# Patient Record
Sex: Female | Born: 1940
Health system: Southern US, Community
[De-identification: ages and names within clinical notes are randomized; demographics above are authoritative.]

## PROBLEM LIST (undated history)

## (undated) DIAGNOSIS — D649 Anemia, unspecified: Secondary | ICD-10-CM

## (undated) DIAGNOSIS — R51 Headache: Secondary | ICD-10-CM

## (undated) DIAGNOSIS — N059 Unspecified nephritic syndrome with unspecified morphologic changes: Secondary | ICD-10-CM

## (undated) DIAGNOSIS — M1990 Unspecified osteoarthritis, unspecified site: Secondary | ICD-10-CM

## (undated) DIAGNOSIS — I10 Essential (primary) hypertension: Secondary | ICD-10-CM

## (undated) DIAGNOSIS — H8110 Benign paroxysmal vertigo, unspecified ear: Secondary | ICD-10-CM

## (undated) DIAGNOSIS — G4733 Obstructive sleep apnea (adult) (pediatric): Secondary | ICD-10-CM

## (undated) DIAGNOSIS — E785 Hyperlipidemia, unspecified: Secondary | ICD-10-CM

## (undated) DIAGNOSIS — K219 Gastro-esophageal reflux disease without esophagitis: Secondary | ICD-10-CM

## (undated) DIAGNOSIS — J40 Bronchitis, not specified as acute or chronic: Secondary | ICD-10-CM

## (undated) DIAGNOSIS — E7800 Pure hypercholesterolemia, unspecified: Secondary | ICD-10-CM

## (undated) DIAGNOSIS — R002 Palpitations: Secondary | ICD-10-CM

## (undated) DIAGNOSIS — Z86010 Personal history of colonic polyps: Secondary | ICD-10-CM

## (undated) DIAGNOSIS — I639 Cerebral infarction, unspecified: Secondary | ICD-10-CM

## (undated) DIAGNOSIS — G459 Transient cerebral ischemic attack, unspecified: Secondary | ICD-10-CM

## (undated) DIAGNOSIS — G5600 Carpal tunnel syndrome, unspecified upper limb: Secondary | ICD-10-CM

## (undated) DIAGNOSIS — K5790 Diverticulosis of intestine, part unspecified, without perforation or abscess without bleeding: Secondary | ICD-10-CM

## (undated) HISTORY — DX: Bronchitis, not specified as acute or chronic: J40

## (undated) HISTORY — DX: Unspecified nephritic syndrome with unspecified morphologic changes: N05.9

## (undated) HISTORY — DX: Carpal tunnel syndrome, unspecified upper limb: G56.00

## (undated) HISTORY — DX: Benign paroxysmal vertigo, unspecified ear: H81.10

## (undated) HISTORY — PX: BREAST BIOPSY: SHX20

## (undated) HISTORY — DX: Unspecified osteoarthritis, unspecified site: M19.90

## (undated) HISTORY — DX: Diverticulosis of intestine, part unspecified, without perforation or abscess without bleeding: K57.90

## (undated) HISTORY — PX: BLADDER REPAIR: SHX76

## (undated) HISTORY — DX: Palpitations: R00.2

## (undated) HISTORY — PX: TONSILLECTOMY: SHX5217

## (undated) HISTORY — DX: Transient cerebral ischemic attack, unspecified: G45.9

## (undated) HISTORY — PX: REPLACEMENT TOTAL KNEE BILATERAL: SUR1225

## (undated) HISTORY — DX: Gastro-esophageal reflux disease without esophagitis: K21.9

## (undated) HISTORY — PX: BREAST EXCISIONAL BIOPSY: SUR124

## (undated) HISTORY — PX: UPPER GASTROINTESTINAL ENDOSCOPY: SHX188

## (undated) HISTORY — DX: Personal history of colonic polyps: Z86.010

## (undated) HISTORY — PX: COLONOSCOPY: SHX174

## (undated) HISTORY — DX: Essential (primary) hypertension: I10

## (undated) HISTORY — DX: Anemia, unspecified: D64.9

## (undated) HISTORY — DX: Obstructive sleep apnea (adult) (pediatric): G47.33

## (undated) HISTORY — DX: Hyperlipidemia, unspecified: E78.5

## (undated) HISTORY — DX: Headache: R51

## (undated) HISTORY — PX: CARPAL TUNNEL RELEASE: SHX101

---

## 1998-11-26 ENCOUNTER — Ambulatory Visit (HOSPITAL_COMMUNITY): Admission: RE | Admit: 1998-11-26 | Discharge: 1998-11-26 | Payer: Self-pay | Admitting: Gastroenterology

## 1999-11-03 ENCOUNTER — Encounter: Payer: Self-pay | Admitting: Urology

## 1999-11-04 ENCOUNTER — Inpatient Hospital Stay (HOSPITAL_COMMUNITY): Admission: RE | Admit: 1999-11-04 | Discharge: 1999-11-07 | Payer: Self-pay | Admitting: Urology

## 2000-05-31 ENCOUNTER — Ambulatory Visit (HOSPITAL_COMMUNITY): Admission: RE | Admit: 2000-05-31 | Discharge: 2000-05-31 | Payer: Self-pay | Admitting: Urology

## 2004-10-25 ENCOUNTER — Ambulatory Visit: Payer: Self-pay | Admitting: Family Medicine

## 2004-10-27 ENCOUNTER — Ambulatory Visit: Payer: Self-pay | Admitting: Family Medicine

## 2004-11-04 ENCOUNTER — Ambulatory Visit: Payer: Self-pay | Admitting: Family Medicine

## 2004-11-09 ENCOUNTER — Ambulatory Visit: Payer: Self-pay | Admitting: Internal Medicine

## 2004-11-11 ENCOUNTER — Ambulatory Visit (HOSPITAL_COMMUNITY): Admission: RE | Admit: 2004-11-11 | Discharge: 2004-11-11 | Payer: Self-pay | Admitting: Family Medicine

## 2004-11-18 ENCOUNTER — Ambulatory Visit: Payer: Self-pay | Admitting: Family Medicine

## 2005-10-12 ENCOUNTER — Ambulatory Visit: Payer: Self-pay | Admitting: Family Medicine

## 2005-10-13 ENCOUNTER — Ambulatory Visit: Payer: Self-pay | Admitting: Internal Medicine

## 2007-04-08 ENCOUNTER — Ambulatory Visit (HOSPITAL_COMMUNITY): Admission: RE | Admit: 2007-04-08 | Discharge: 2007-04-08 | Payer: Self-pay | Admitting: Gynecology

## 2007-04-12 ENCOUNTER — Encounter: Admission: RE | Admit: 2007-04-12 | Discharge: 2007-04-12 | Payer: Self-pay | Admitting: Gynecology

## 2008-01-13 ENCOUNTER — Inpatient Hospital Stay (HOSPITAL_COMMUNITY): Admission: RE | Admit: 2008-01-13 | Discharge: 2008-01-17 | Payer: Self-pay | Admitting: Orthopedic Surgery

## 2008-04-27 ENCOUNTER — Ambulatory Visit (HOSPITAL_COMMUNITY): Admission: RE | Admit: 2008-04-27 | Discharge: 2008-04-27 | Payer: Self-pay | Admitting: Cardiology

## 2008-07-20 ENCOUNTER — Inpatient Hospital Stay (HOSPITAL_COMMUNITY): Admission: RE | Admit: 2008-07-20 | Discharge: 2008-07-23 | Payer: Self-pay | Admitting: Orthopedic Surgery

## 2008-07-21 ENCOUNTER — Encounter (INDEPENDENT_AMBULATORY_CARE_PROVIDER_SITE_OTHER): Payer: Self-pay | Admitting: Orthopedic Surgery

## 2008-07-21 ENCOUNTER — Ambulatory Visit: Payer: Self-pay | Admitting: Vascular Surgery

## 2009-05-13 ENCOUNTER — Ambulatory Visit (HOSPITAL_COMMUNITY): Admission: RE | Admit: 2009-05-13 | Discharge: 2009-05-13 | Payer: Self-pay | Admitting: Cardiology

## 2010-05-20 ENCOUNTER — Ambulatory Visit (HOSPITAL_COMMUNITY): Admission: RE | Admit: 2010-05-20 | Discharge: 2010-05-20 | Payer: Self-pay | Admitting: Cardiology

## 2010-05-30 ENCOUNTER — Encounter (INDEPENDENT_AMBULATORY_CARE_PROVIDER_SITE_OTHER): Payer: Self-pay | Admitting: *Deleted

## 2010-07-03 ENCOUNTER — Emergency Department (HOSPITAL_COMMUNITY): Admission: EM | Admit: 2010-07-03 | Discharge: 2010-07-03 | Payer: Self-pay | Admitting: Emergency Medicine

## 2010-08-11 ENCOUNTER — Ambulatory Visit: Payer: Self-pay | Admitting: Internal Medicine

## 2010-08-11 ENCOUNTER — Encounter (INDEPENDENT_AMBULATORY_CARE_PROVIDER_SITE_OTHER): Payer: Self-pay | Admitting: *Deleted

## 2010-08-11 DIAGNOSIS — K219 Gastro-esophageal reflux disease without esophagitis: Secondary | ICD-10-CM

## 2010-08-11 DIAGNOSIS — K222 Esophageal obstruction: Secondary | ICD-10-CM

## 2010-09-14 ENCOUNTER — Telehealth: Payer: Self-pay | Admitting: Internal Medicine

## 2010-09-15 ENCOUNTER — Ambulatory Visit: Payer: Self-pay | Admitting: Internal Medicine

## 2010-09-15 DIAGNOSIS — Z860100 Personal history of colon polyps, unspecified: Secondary | ICD-10-CM

## 2010-09-15 HISTORY — DX: Personal history of colonic polyps: Z86.010

## 2010-09-15 HISTORY — DX: Personal history of colon polyps, unspecified: Z86.0100

## 2010-09-19 ENCOUNTER — Encounter: Payer: Self-pay | Admitting: Internal Medicine

## 2010-10-19 ENCOUNTER — Ambulatory Visit: Payer: Self-pay | Admitting: Cardiology

## 2011-01-19 NOTE — Procedures (Signed)
Summary: Upper Endoscopy  Patient: Maria [name] Note: All result statuses are Final unless otherwise noted.  Tests: (1) Upper Endoscopy (EGD)   EGD Upper Endoscopy       DONE (C)     [facility] [employer].     [place], [place]  [id number]           ENDOSCOPY PROCEDURE REPORT           PATIENT:  [name], [name]  MR#:  [id number]     BIRTHDATE:  04-Jul-1941, 68 yrs. old  GENDER:  female           ENDOSCOPIST:  Gatha Mayer, MD, [facility]           PROCEDURE DATE:  09/15/2010     PROCEDURE:  EGD, diagnostic, Maloney Dilation of Esophagus     ASA CLASS:  Class II     INDICATIONS:  dysphagia, GERD           MEDICATIONS:   Fentanyl 50 mcg IV, Versed 6 mg     TOPICAL ANESTHETIC:  Exactacain Spray           DESCRIPTION OF PROCEDURE:   After the risks benefits and     alternatives of the procedure were thoroughly explained, informed     consent was obtained.  The [facility] [id number]-H180 [id number] endoscope was     introduced through the mouth and advanced to the second portion of     the duodenum, without limitations.  The instrument was slowly     withdrawn as the mucosa was fully examined.     <<PROCEDUREIMAGES>>           A stricture was found at the gastroesophageal junction. Dilation     with maloney dilator - 36 French Maloney dilation performed     without blood on dilator after scope withdrawn.  Otherwise the     examination was normal.    Retroflexed views revealed no     abnormalities.    The scope was then withdrawn from the patient     and the procedure completed.           COMPLICATIONS:  None           ENDOSCOPIC IMPRESSION:     1) Stricture at the gastroesophageal junction - dilated with 54     Mrench Maloney     2) Otherwise normal examination     RECOMMENDATIONS:     Clear liquids until 5 PM then soft food. Normal food tomorrow.           continue Nexium ADDENDUM: THIS WAS TOO EXPENSIVE SO OMEPRAZOLE     40 MG DAILY PRESCRIBED           See colonoscopy  report also           REPEAT EXAM:  as needed           Gatha Mayer, MD, [facility]           CC:  The Patient, Darlin Coco, MD           n.     REVISED:  09/15/2010 04:34 PM     eSIGNED:   Gatha Mayer at 09/15/2010 04:34 PM           Ciszek, [name], [id number]  Note: An exclamation mark (!) indicates a result that was not dispersed into the flowsheet. Document Creation Date: 09/15/2010 4:35 PM _______________________________________________________________________  (1)  Order result status: Final Collection or observation date-time: 09/15/2010 15:33 Requested date-time:  Receipt date-time:  Reported date-time:  Referring Physician:   Ordering Physician: Silvano Rusk [phone]) Specimen Source:  Source: Tawanna Cooler Order Number: [id number] Lab site:

## 2011-01-19 NOTE — Procedures (Signed)
Summary: EGD: Esophageal Stricture   EGD  Procedure date:  11/09/2004  Findings:      Findings: Stricture:  Location: [place]   Comments: DISTAL ESOPHAGEAL RING, DILATED TO 54 FR  Patient Name: Maria [name], Maria [name]. MRN:  Procedure Procedures: Panendoscopy (EGD) CPT: 65681.    with [facility] Dilation of Esophagus Personnel: Endoscopist: Gatha Mayer, MD, [facility].  Exam Location: Exam performed in Outpatient Clinic. Outpatient  Patient Consent: Procedure, Alternatives, Risks and Benefits discussed, consent obtained, from patient. Consent was obtained by the RN.  Indications Symptoms: Reflux symptoms  Comments: [id number] STUDY  History  Current Medications: Patient is not currently taking Coumadin.  Pre-Exam Physical: Performed Nov 09, 2004  Cardio-pulmonary exam, HEENT exam, Mental status exam WNL. Abnormal PE findings include: ASA I.  Exam Exam Info: Maximum depth of insertion Duodenum, intended Duodenum. Patient position: on left side. Gastric retroflexion performed. Images taken. ASA Classification: I. Tolerance: excellent.  Sedation Meds: Patient assessed and found to be appropriate for moderate (conscious) sedation. Fentanyl  50 mcg. given IV. Versed 5 mg. given IV. Cetacaine Spray 2 sprays given aerosolized.  Monitoring: BP and pulse monitoring done. Oximetry used. Supplemental O2 given  Findings Normal: Proximal Esophagus to Mid Esophagus.  STRICTURE / STENOSIS: Stricture in Distal Esophagus.  Constriction: partial. Etiology: benign due to reflux. Lumen diameter is 16 mm. ICD9: Esophageal Stricture: 530.3.  - Dilation: Distal Esophagus. Maloney dilator used, Diameter: 18 mm, Moderate Resistance, No Heme present on extraction. 1  total dilators used. Patient tolerance excellent.  - Normal: Fundus to Duodenal 2nd Portion.   Assessment Abnormal examination, see findings above.  Diagnoses: 530.3: Esophageal Stricture.   Comments: DISTAL  ESOPHAGEAL RING, DILATED TO 54 FR Events  Unplanned Intervention: No unplanned interventions were required.  Plans Comments: SHE SHOULD TAKE A DAILY PPI (CHRONICALLY) GIVEN HER HISTORY Disposition: After procedure patient sent to recovery. After recovery patient sent home.   CC:   The Patient  This report was created from the original endoscopy report, which was reviewed and signed by the above listed endoscopist.

## 2011-01-19 NOTE — Assessment & Plan Note (Signed)
Summary: gerd,....em   History of Present Illness Visit Type: Initial Visit Primary GI MD: Silvano Rusk MD [facility] Primary Provider: Burney Gauze Requesting Provider: n/a Chief Complaint: GERD and epigastric pain, Pt wonders if she may have a hiatal henia but has never been dx'd with that before History of Present Illness:   70 yo ww with GERD and prior esophageal dilation. Now with 1 year of burning epigastric pain.  She had gotten off PPI and thinks that was related to the problem. Started Prilosec OTC 3 months ago. If she eats too fast or too big may have dysphagia but none in 3 months. Overreating, eating late at night will exacerbate things. She minimizes caffeine.   GI Review of Systems    Reports abdominal pain, acid reflux, belching, bloating, chest pain, dysphagia with solids, heartburn, weight loss, and  weight gain.     Location of  Abdominal pain: epigastric area.    Denies loss of appetite, nausea, vomiting, and  vomiting blood.      Reports diverticulosis.     Denies anal fissure, black tarry stools, change in bowel habit, constipation, diarrhea, fecal incontinence, heme positive stool, hemorrhoids, irritable bowel syndrome, jaundice, light color stool, liver problems, rectal bleeding, and  rectal pain. Preventive Screening-Counseling & Management  Alcohol-Tobacco     Alcohol drinks/day: 0-2     Alcohol type: wine     >5/day in last 3 mos: no     Alcohol Counseling: not indicated; patient does not drink     Smoking Status: quit > 6 months     Smoking Cessation Counseling: no     Smoke Cessation Stage: quit     Passive Smoke Exposure: no     Tobacco Counseling: not indicated; no tobacco use     Passive Smoke Counseling: not indicated; no passive smoke exposure  Caffeine-Diet-Exercise     Caffeine use/day: 0-1     Caffeine Counseling: not indicated; caffeine use is not excessive or problematic     Does Patient Exercise: yes     Type of exercise: walking  Exercise (avg: min/session): <30     Times/week: 4     Exercise Counseling: to improve exercise regimen      Drug Use:  no.      Clinical Reports Reviewed:  EGD:  11/09/2004:  Findings: Stricture:  Location: [place]   Comments: DISTAL ESOPHAGEAL RING, DILATED TO 54 FR   Current Medications (verified): 1)  Norvasc 5 Mg Tabs (Amlodipine Besylate) .Marland Kitchen.. 1 By Mouth Once Daily 2)  Aspirin 81 Mg Tbec (Aspirin) .Marland Kitchen.. 1 By Mouth Once Daily 3)  Prilosec 20 Mg Cpdr (Omeprazole) .Marland Kitchen.. 1 By Mouth Once Daily 4)  Hydrochlorothiazide 25 Mg Tabs (Hydrochlorothiazide) .Marland Kitchen.. 1 By Mouth Once Daily 5)  Klor-Con M20 20 Meq Cr-Tabs (Potassium Chloride Crys Cr) .Marland Kitchen.. 1 By Mouth Once Daily  Allergies (verified): 1)  Codeine  Past History:  Past Medical History: Carpal Tunnel Syndrome Anemia Arthritis Hypertension Sleep Apnea chronic headached Diverticulosis GERD Pneumonia  Past Surgical History: Reviewed history from 08/11/2010 and no changes required. Carpal Tunnel Release x 2 Breast Biopsy -Rt, benign cyst Knee Replacement - Bilateral Tonsillectomy Bladder Repair  Family History: Family History of Diabetes: Father,brother Family History of Heart Disease: Father, Mother Family History of Colon Cancer: MGM Family History of Colon Polyps:mother Family History of Irritable Bowel Syndrome:daughter Family History of Kidney Disease:father  Social History: Divorced, 3 children retired Alcohol Use - yes 0-2  Illicit Drug Use - no  Drug Use:  no Caffeine use/day:  0-1 Alcohol drinks/day:  0-2 >5/day in last 3 mos:  no Smoking Status:  quit > 6 months Passive Smoke Exposure:  no Does Patient Exercise:  yes  Review of Systems       The patient complains of arthritis/joint pain, back pain, cough, shortness of breath, sleeping problems, swelling of feet/legs, urine leakage, and vision changes.         All other ROS negative except as per HPI. to have eye  exam   Vital Signs:  Patient profile:   70 year old female Height:      64 inches Weight:      160 pounds BMI:     27.56 BSA:     1.78 Pulse rate:   80 / minute Pulse rhythm:   regular BP sitting:   122 / 70  (left arm)  Vitals Entered By: [place] Deborra Medina) (August 11, 2010 3:20 PM)  Physical Exam  General:  Well developed, well nourished, no acute distress. Eyes:  PERRLA, no icterus. Mouth:  No deformity or lesions, dentition normal. Neck:  Supple; no masses or thyromegaly. Lungs:  Clear throughout to auscultation. Heart:  Regular rate and rhythm; no murmurs, rubs,  or bruits. Abdomen:  Soft, mildly tender in LLQ and nondistended. No masses, hepatosplenomegaly or hernias noted. Normal bowel sounds. Rectal:  deferred until time of colonoscopy.   Msk:  scars bilateral knees Extremities:  No clubbing, cyanosis, edema or deformities noted. Neurologic:  Alert and  oriented x3 Cervical Nodes:  No significant cervical or supraclavicular adenopathy.  Psych:  Alert and cooperative. Normal mood and affect.   Impression & Recommendations:  Problem # 1:  GERD (ICD-530.81) Assessment New  I think epigastric pain is from this needs stronger PPI EGD likely - reassess taht when she returns, could defer depending upon clinical course.  Orders: Colon/Endo (Colon/Endo)  Problem # 2:  ESOPHAGEAL STRICTURE (ICD-530.3) Assessment: New  Risks, benefits,and indications of endoscopic procedure(s) were reviewed with the patient and all questions answered.  Orders: Colon/Endo (Colon/Endo)  Problem # 3:  SCREENING COLORECTAL-CANCER (ICD-V76.51) Assessment: New  Risks, benefits,and indications of endoscopic procedure(s) were reviewed with the patient and all questions answered. last colonoscopy 2000.  Orders: Colon/Endo (Colon/Endo)  Patient Instructions: 1)  Please pick up your medications at your pharmacy. 2)  GI Reflux brochure given.  3)  We will see you at your  procedure on 09/15/10. 4)  [facility] Patient Information Guide given to patient.  5)  Colonoscopy and Flexible Sigmoidoscopy brochure given.  6)  Upper Endoscopy with Dilatation brochure given.  7)  Copy sent to : Warren Danes, MD 8)  The medication list was reviewed and reconciled.  All changed / newly prescribed medications were explained.  A complete medication list was provided to the patient / caregiver. Prescriptions: MOVIPREP 100 GM  SOLR (PEG-KCL-NACL-NASULF-NA ASC-C) As per prep instructions.  #1 x 0   Entered by:   Abelino Derrick CMA ([place])   Authorized by:   Gatha Mayer MD, [facility]   Signed by:   Abelino Derrick CMA ([place]) on 08/11/2010   Method used:   Electronically to        [id number] (retail)       [address]       [place], [place]  [address]       Ph: [phone] or [phone]       Fax: [phone]   RxID:  [id number] NEXIUM 40 MG CPDR (ESOMEPRAZOLE MAGNESIUM) 1 by mouth 30 minutes before breakfast  #30 x 11   Entered and Authorized by:   Gatha Mayer MD, [facility]   Signed by:   Gatha Mayer MD, [facility] on 08/11/2010   Method used:   Electronically to        [id number] (retail)       [address]       [place], [place]  [address]       Ph: [phone] or [phone]       Fax: [phone]   RxID:   [id number]

## 2011-01-19 NOTE — Miscellaneous (Signed)
Summary: omeprazole rx (Nexium too costly)  Clinical Lists Changes  Medications: Changed medication from [place] 40 MG CPDR (ESOMEPRAZOLE MAGNESIUM) 1 by mouth 30 minutes before breakfast to OMEPRAZOLE 40 MG CPDR (OMEPRAZOLE) 1 by mouth once daily 30-60 minutes beforwe a meal - Signed Rx of OMEPRAZOLE 40 MG CPDR (OMEPRAZOLE) 1 by mouth once daily 30-60 minutes beforwe a meal;  #30 x 11;  Signed;  Entered by: Gatha Mayer MD, [facility];  Authorized by: Gatha Mayer MD, FACG;  Method used: Electronically to [id number] [address], [place], [place]  [address], Ph: [phone] or [phone], Fax: [phone]    Prescriptions: OMEPRAZOLE 40 MG CPDR (OMEPRAZOLE) 1 by mouth once daily 30-60 minutes beforwe a meal  #30 x 11   Entered and Authorized by:   Gatha Mayer MD, [facility]   Signed by:   Gatha Mayer MD, [facility] on 09/15/2010   Method used:   Electronically to        [id number] (retail)       [address]       [place], [place]  [address]       Ph: [phone] or [phone]       Fax: [phone]   RxID:   [id number]

## 2011-01-19 NOTE — Letter (Signed)
Summary: New Patient letter  [facility]  [address] [place], [place] [address]   Phone: [phone]  Fax: [phone]       05/30/2010 MRN: [id number]  [place] [address] [place], [place]  [address]  Dear Maria [name],  Welcome to the Gastroenterology Division at [facility].    You are scheduled to see Dr.  Silvano Rusk on July 06, 2010 at 3:00pm on the 3rd floor at [facility] [place] [facility].  We ask that you try to arrive at our office 15 minutes prior to your appointment time to allow for check-in.  We would like you to complete the enclosed self-administered evaluation form prior to your visit and bring it with you on the day of your appointment.  We will review it with you.  Also, please bring a complete list of all your medications or, if you prefer, bring the medication bottles and we will list them.  Please bring your insurance card so that we may make a copy of it.  If your insurance requires a referral to see a specialist, please bring your referral form from your primary care physician.  Co-payments are due at the time of your visit and may be paid by cash, check or credit card.     Your office visit will consist of a consult with your physician (includes a physical exam), any laboratory testing he/she may order, scheduling of any necessary diagnostic testing (e.g. x-ray, ultrasound, CT-scan), and scheduling of a procedure (e.g. Endoscopy, Colonoscopy) if required.  Please allow enough time on your schedule to allow for any/all of these possibilities.    If you cannot keep your appointment, please call [phone] to cancel or reschedule prior to your appointment date.  This allows [place] the opportunity to schedule an appointment for another patient in need of care.  If you do not cancel or reschedule by 5 p.m. the business day prior to your appointment date, you will be charged a $50.00 late cancellation/no-show fee.    Thank you for  choosing [facility] for your medical needs.  We appreciate the opportunity to care for you.  Please visit [place] at our website  to learn more about our practice.                     Sincerely,                                                             The Gastroenterology Division

## 2011-01-19 NOTE — Progress Notes (Signed)
Summary: prep ?  Phone Note Call from Patient Call back at [phone]  (cell)   Caller: Patient Call For: Dr. Carlean Purl Reason for Call: Talk to Nurse Summary of Call: prep ?'s Initial call taken by: Lucien Mons,  September 14, 2010 1:51 PM  Follow-up for Phone Call        Attempted to return pts. phone call with no answer.  Will try again. Follow-up by: Alphonsa Gin RN,  September 14, 2010 2:28 PM  Additional Follow-up for Phone Call Additional follow up Details #1::        Spoke with pt.  She is out of town and had left her prep instructions at home.  She also ate today.  Went over instructions over the phone with her and also advised her to drink plenty of clear liquids between now and her procedure tomorrow.  Pt. verbalizes understanding. Additional Follow-up by: Alphonsa Gin RN,  September 14, 2010 2:39 PM

## 2011-01-19 NOTE — Letter (Signed)
Summary: [facility] Instructions  [facility]  [place], [place] [address]   Phone: [phone]  Fax: [phone]       Maria [name]    [birth year]    MRN: [id number]      Procedure Day Sudie Grumbling: Raquel Sarna, 09/15/10     Arrival Time: 2:00 PM      Procedure Time: 3:00 PM    Location of Procedure:                    _X_  [place] (4th Floor)  [place]   Starting 5 days prior to your procedure 09/10/10 do not eat nuts, seeds, popcorn, corn, beans, peas,  salads, or any raw vegetables.  Do not take any fiber supplements (e.g. Metamucil, Citrucel, and Benefiber).  THE DAY BEFORE YOUR PROCEDURE         WEDNESDAY, 09/14/10  1.  Drink clear liquids the entire day-NO SOLID FOOD  2.  Do not drink anything colored red or purple.  Avoid juices with pulp.  No orange juice.  3.  Drink at least 64 oz. (8 glasses) of fluid/clear liquids during the day to prevent dehydration and help the prep work efficiently.  CLEAR LIQUIDS INCLUDE: Water Jello Ice Popsicles Tea (sugar ok, no milk/cream) Powdered fruit flavored drinks Coffee (sugar ok, no milk/cream) Gatorade Juice: apple, white grape, white cranberry  Lemonade Clear bullion, consomm, broth Carbonated beverages (any kind) Strained chicken noodle soup Hard Candy                           4.  In the morning, mix first dose of MoviPrep solution:    Empty 1 Pouch A and 1 Pouch B into the disposable container    Add lukewarm drinking water to the top line of the container. Mix to dissolve    Refrigerate (mixed solution should be used within 24 hrs)  5.  Begin drinking the prep at 5:00 p.m. The MoviPrep container is divided by 4 marks.   Every 15 minutes drink the solution down to the next mark (approximately 8 oz) until the full liter is complete.   6.  Follow completed prep with 16 oz of clear liquid of your choice (Nothing red or purple).  Continue to drink  clear liquids until bedtime.  7.  Before going to bed, mix second dose of MoviPrep solution:    Empty 1 Pouch A and 1 Pouch B into the disposable container    Add lukewarm drinking water to the top line of the container. Mix to dissolve    Refrigerate  THE DAY OF YOUR PROCEDURE      THURSDAY, 09/15/10  Beginning at 10:00 a.m. (5 hours before procedure):         1. Every 15 minutes, drink the solution down to the next mark (approx 8 oz) until the full liter is complete.  2. Follow completed prep with 16 oz. of clear liquid of your choice.    3. You may drink clear liquids until 1:00 PM (2 HOURS BEFORE PROCEDURE).  MEDICATION INSTRUCTIONS  Unless otherwise instructed, you should take regular prescription medications with a small sip of water   as early as possible the morning of your procedure.       OTHER INSTRUCTIONS  You will need a responsible adult at least 71 years of age to accompany you and drive you home.   This  person must remain in the waiting room during your procedure.  Wear loose fitting clothing that is easily removed.  Leave jewelry and other valuables at home.  However, you may wish to bring a book to read or  an iPod/MP3 player to listen to music as you wait for your procedure to start.  Remove all body piercing jewelry and leave at home.  Total time from sign-in until discharge is approximately 2-3 hours.  You should go home directly after your procedure and rest.  You can resume normal activities the  day after your procedure.  The day of your procedure you should not:   Drive   Make legal decisions   Operate machinery   Drink alcohol   Return to work  You will receive specific instructions about eating, activities and medications before you leave.   The above instructions have been reviewed and explained to me by   _______________________   I fully understand and can verbalize these instructions _____________________________ Date  _________

## 2011-01-19 NOTE — Letter (Signed)
Summary: Patient Notice- Polyp Results  [facility]  [place], [place] [address]   Phone: [phone]  Fax: [phone]        September 19, 2010 MRN: [id number]    [place] [address] [place], [place]  [address]    Dear Maria [name],  The polyps removed from your colon were adenomatous. This means that they were pre-cancerous or that  they had the potential to change into cancer over time.   I recommend that you have a repeat colonoscopy in 3 years to determine if you have developed any new polyps over time and to screen for colorectal cancer.If you develop any new rectal bleeding, abdominal pain or significant bowel habit changes, please contact us before then.  Please call us if you are having persistent problems or have questions about your condition that have not been fully answered at this time.   Sincerely,  Gatha Mayer MD, [facility]  This letter has been electronically signed by your physician.  Appended Document: Patient Notice- Polyp Results letter mailed

## 2011-01-19 NOTE — Procedures (Signed)
Summary: Colonoscopy  Patient: Maria [name] Note: All result statuses are Final unless otherwise noted.  Tests: (1) Colonoscopy (COL)   COL Colonoscopy           [name]     [name], [name]           COLONOSCOPY PROCEDURE REPORT           PATIENT:  [name], [name]  MR#:  [id number]     BIRTHDATE:  20-Jul-1941, 68 yrs. old  GENDER:  female     ENDOSCOPIST:  Gatha Mayer, MD, [facility]           PROCEDURE DATE:  09/15/2010     PROCEDURE:  Colonoscopy with biopsy and snare polypectomy     ASA CLASS:  Class II     INDICATIONS:  Routine Risk Screening     MEDICATIONS:   Fentanyl 25 mcg IV, Versed 2 mg IV, There was     residual sedation effect present from prior procedure.           DESCRIPTION OF PROCEDURE:   After the risks benefits and     alternatives of the procedure were thoroughly explained, informed     consent was obtained.  Digital rectal exam was performed and     revealed no abnormalities.   The LB PCF-H180AL [id number] endoscope     was introduced through the anus and advanced to the cecum, which     was identified by both the appendix and ileocecal valve, without     limitations.  The quality of the prep was excellent, using     MoviPrep.  The instrument was then slowly withdrawn as the colon     was fully examined. Insertion: 8:30 minutes Withdrawal: 13:19     minutes     <<PROCEDUREIMAGES>>           FINDINGS:  Three polyps were found in the right colon. Two     diminutive and one flat 64m polyp removed from cecum/ascending     colon area. The diminutive polyps were removed using cold biopsy     forceps. The 8 mm polyp was snared without cautery. Retrieval was     successful.  Moderate diverticulosis was found in the sigmoid     colon.  This was otherwise a normal examination of the colon,     including right colon retroflexion.   Retroflexed views in the     rectum revealed no abnormalities.    The scope was then  withdrawn     from the patient and the procedure completed.           COMPLICATIONS:  None     ENDOSCOPIC IMPRESSION:     1) Three polyps in the right colon - largest 8 mm. All removed.           2) Moderate diverticulosis in the sigmoid colon     3) Otherwise normal examination, excellent prep     RECOMMENDATIONS:     Start Align 1 by mouth daily (purchase over the counter) to help     with gas.           REPEAT EXAM:  In for Colonoscopy, pending biopsy results.           CGatha Mayer MD, FMarval Regal          CC:  TDarlin Coco MD  The Patient           n.     eSIGNED:   Gatha Mayer at 09/15/2010 04:26 PM           [name], [name], [phone]  Note: An exclamation mark (!) indicates a result that was not dispersed into the flowsheet. Document Creation Date: 09/15/2010 4:27 PM _______________________________________________________________________  (1) Order result status: Final Collection or observation date-time: 09/15/2010 16:03 Requested date-time:  Receipt date-time:  Reported date-time:  Referring Physician:   Ordering Physician: Silvano Rusk [phone]) Specimen Source:  Source: Tawanna Cooler Order Number: [id number] Lab site:   Appended Document: Colonoscopy     Procedures Next Due Date:    Colonoscopy: 08/2013

## 2011-01-20 LAB — PULMONARY FUNCTION TEST

## 2011-01-31 ENCOUNTER — Emergency Department (HOSPITAL_COMMUNITY): Payer: MEDICARE

## 2011-01-31 ENCOUNTER — Ambulatory Visit: Payer: Self-pay | Admitting: Cardiology

## 2011-01-31 ENCOUNTER — Observation Stay (HOSPITAL_COMMUNITY)
Admission: EM | Admit: 2011-01-31 | Discharge: 2011-02-02 | Disposition: A | Discharge status: Home / Self Care | Payer: MEDICARE | Attending: Internal Medicine | Admitting: Internal Medicine

## 2011-01-31 DIAGNOSIS — I4949 Other premature depolarization: Secondary | ICD-10-CM | POA: Insufficient documentation

## 2011-01-31 DIAGNOSIS — I10 Essential (primary) hypertension: Secondary | ICD-10-CM | POA: Insufficient documentation

## 2011-01-31 DIAGNOSIS — R51 Headache: Secondary | ICD-10-CM | POA: Insufficient documentation

## 2011-01-31 DIAGNOSIS — F29 Unspecified psychosis not due to a substance or known physiological condition: Secondary | ICD-10-CM | POA: Insufficient documentation

## 2011-01-31 DIAGNOSIS — G4733 Obstructive sleep apnea (adult) (pediatric): Secondary | ICD-10-CM | POA: Insufficient documentation

## 2011-01-31 DIAGNOSIS — M549 Dorsalgia, unspecified: Secondary | ICD-10-CM | POA: Insufficient documentation

## 2011-01-31 DIAGNOSIS — E042 Nontoxic multinodular goiter: Secondary | ICD-10-CM | POA: Insufficient documentation

## 2011-01-31 DIAGNOSIS — E860 Dehydration: Secondary | ICD-10-CM | POA: Insufficient documentation

## 2011-01-31 DIAGNOSIS — Z87891 Personal history of nicotine dependence: Secondary | ICD-10-CM | POA: Insufficient documentation

## 2011-01-31 DIAGNOSIS — R4789 Other speech disturbances: Secondary | ICD-10-CM | POA: Insufficient documentation

## 2011-01-31 DIAGNOSIS — Z7982 Long term (current) use of aspirin: Secondary | ICD-10-CM | POA: Insufficient documentation

## 2011-01-31 DIAGNOSIS — K219 Gastro-esophageal reflux disease without esophagitis: Secondary | ICD-10-CM | POA: Insufficient documentation

## 2011-01-31 DIAGNOSIS — W19XXXA Unspecified fall, initial encounter: Secondary | ICD-10-CM | POA: Insufficient documentation

## 2011-01-31 DIAGNOSIS — I771 Stricture of artery: Secondary | ICD-10-CM | POA: Insufficient documentation

## 2011-01-31 DIAGNOSIS — R29810 Facial weakness: Secondary | ICD-10-CM | POA: Insufficient documentation

## 2011-01-31 DIAGNOSIS — R0602 Shortness of breath: Secondary | ICD-10-CM | POA: Insufficient documentation

## 2011-01-31 DIAGNOSIS — Z79899 Other long term (current) drug therapy: Secondary | ICD-10-CM | POA: Insufficient documentation

## 2011-01-31 DIAGNOSIS — R42 Dizziness and giddiness: Principal | ICD-10-CM | POA: Insufficient documentation

## 2011-01-31 LAB — APTT: aPTT: 33 seconds (ref 24–37)

## 2011-01-31 LAB — DIFFERENTIAL
Eosinophils Absolute: 0.1 10*3/uL (ref 0.0–0.7)
Eosinophils Relative: 1 % (ref 0–5)
Lymphocytes Relative: 23 % (ref 12–46)
Lymphs Abs: 2.8 10*3/uL (ref 0.7–4.0)
Monocytes Relative: 7 % (ref 3–12)
Neutrophils Relative %: 70 % (ref 43–77)

## 2011-01-31 LAB — BASIC METABOLIC PANEL
Chloride: 98 mEq/L (ref 96–112)
GFR calc Af Amer: >60 mL/min (ref >60)
GFR calc non Af Amer: >60 mL/min (ref >60)
Potassium: 3.6 mEq/L (ref 3.5–5.1)
Sodium: 135 mEq/L (ref 135–145)

## 2011-01-31 LAB — URINALYSIS, ROUTINE W REFLEX MICROSCOPIC
Bilirubin Urine: NEGATIVE
Hgb urine dipstick: NEGATIVE
Ketones, ur: NEGATIVE mg/dL
Nitrite: NEGATIVE
pH: 7.5 (ref 5.0–8.0)

## 2011-01-31 LAB — CBC
HCT: 46.4 % — ABNORMAL HIGH (ref 36.0–46.0)
MCH: 27.2 pg (ref 26.0–34.0)
MCV: 79.9 fL (ref 78.0–100.0)
Platelets: 400 10*3/uL (ref 150–400)
RBC: 5.81 MIL/uL — ABNORMAL HIGH (ref 3.87–5.11)

## 2011-01-31 LAB — PROTIME-INR: INR: 0.96 (ref 0.00–1.49)

## 2011-02-01 ENCOUNTER — Inpatient Hospital Stay (HOSPITAL_COMMUNITY): Payer: MEDICARE

## 2011-02-01 ENCOUNTER — Emergency Department (HOSPITAL_COMMUNITY): Payer: MEDICARE

## 2011-02-01 DIAGNOSIS — G459 Transient cerebral ischemic attack, unspecified: Secondary | ICD-10-CM

## 2011-02-01 DIAGNOSIS — I517 Cardiomegaly: Secondary | ICD-10-CM

## 2011-02-01 LAB — COMPREHENSIVE METABOLIC PANEL
ALT: 18 U/L (ref 0–35)
AST: 19 U/L (ref 0–37)
Albumin: 3.1 g/dL — ABNORMAL LOW (ref 3.5–5.2)
CO2: 27 mEq/L (ref 19–32)
Chloride: 108 mEq/L (ref 96–112)
GFR calc Af Amer: >60 mL/min (ref >60)
GFR calc non Af Amer: >60 mL/min (ref >60)
Potassium: 3.7 mEq/L (ref 3.5–5.1)
Sodium: 139 mEq/L (ref 135–145)
Total Bilirubin: 0.5 mg/dL (ref 0.3–1.2)

## 2011-02-01 LAB — GLUCOSE, CAPILLARY

## 2011-02-01 LAB — LIPID PANEL
HDL: 45 mg/dL (ref >39)
LDL Cholesterol: 79 mg/dL (ref 0–99)
Triglycerides: 80 mg/dL (ref <150)
VLDL: 16 mg/dL (ref 0–40)

## 2011-02-01 LAB — CARDIAC PANEL(CRET KIN+CKTOT+MB+TROPI): Total CK: 51 U/L (ref 7–177)

## 2011-02-01 LAB — HEMOGLOBIN A1C: Hgb A1c MFr Bld: 5.9 % — ABNORMAL HIGH (ref <5.7)

## 2011-02-02 LAB — BASIC METABOLIC PANEL
BUN: 7 mg/dL (ref 6–23)
Chloride: 109 mEq/L (ref 96–112)
GFR calc non Af Amer: >60 mL/min (ref >60)
Glucose, Bld: 96 mg/dL (ref 70–99)
Potassium: 3.9 mEq/L (ref 3.5–5.1)
Sodium: 141 mEq/L (ref 135–145)

## 2011-02-02 LAB — SODIUM, URINE, RANDOM: Sodium, Ur: 59 mEq/L

## 2011-02-02 LAB — CREATININE, URINE, RANDOM: Creatinine, Urine: 12.5 mg/dL

## 2011-02-06 NOTE — H&P (Signed)
Maria [name], Maria [name]              ACCOUNT NO.:  [id number]  MEDICAL RECORD NO.:  [id number]           PATIENT TYPE:  E  LOCATION:  WLED                         FACILITY:  [facility]  PHYSICIAN:  Karlyn Agee, M.D. DATE OF BIRTH:  December 04, 1941  DATE OF ADMISSION:  01/31/2011 DATE OF DISCHARGE:                             HISTORY & PHYSICAL   PRIMARY CARE PHYSICIAN:  Darlin Coco, M.D.  CHIEF COMPLAINT:  Dizziness since this morning.  HISTORY OF PRESENT ILLNESS:  This is a 70 year old Caucasian lady with a history of hypertension and GERD, also has a history of obstructive sleep apnea who said the pressures on her CPAP were recently adjusted upwards and she woke this morning at about 8 a.m. feeling that her tongue was very thick, her mouth was very dry, and she noticed she was swimmy-headed or head seemed to be spinning with her eyes closed and when she tried to walk, she was falling towards the left.  She presumed this was due to dehydration and decided to stay home and drink as much as possible.  About 10 a.m., she called a friend to bring her some Gatorade and then she reports after drinking a lot of Gatorade, she then had something to eat and felt somewhat better.  However, since she did not continue to feel quite right, in the afternoon she called her doctor's office eventually and they advised that she check her blood pressure.  She got a nurse from the community to come in and check her blood pressure; however, when the nurse came and listened to her story, she felt she should come to the emergency room for evaluation.  Her daughter came over to see her and said that her speech appeared to be somewhat garbled and that she felt the right side of her face looked drooping.  After preliminary evaluation in the emergency room, the hospitalist service was called to admit the patient for a suspected TIA.  The patient says she feels almost back to herself, but not quite.   She still feels somewhat weak.  She still feels somewhat swimmy-headed when she moves her head backwards and forwards, but she can now at least keep her eyes open.  The patient denies any fever, cough, or cold.  She denies any chest pain or shortness of breath.  She says her father died at age 78 from stroke due to a ruptured aneurysm.  PAST MEDICAL HISTORY:  Hypertension, GERD, obstructive sleep apnea on CPAP.  History of diverticulosis, status post removal of precancerous polyps.  She is status post bilateral knee surgery, status post carpal tunnel surgery, status post bladder tack x2, status post recent dental surgery and preparation for implants.  MEDICATIONS: 1. Norvasc 5 mg daily. 2. Aspirin 81 mg daily. 3. HCTZ 25 mg daily, did not take it today. 4. Potassium chloride 20 mEq daily. 5. Prilosec 20 mg daily. 6. The patient also takes CoQ10. 7. Omega-3 fatty acids. 8. Vitamin E. 9. Vitamin D over the counter.  ALLERGIES:  Codeine, Benicar causes angioedema, lisinopril causes hypotension, erythromycin causes an unknown reaction.  SOCIAL HISTORY:  She denies tobacco or illicit  drug use.  She very occasionally drinks a glass of wine.  She drank a glass of wine and had a rich meal yesterday.  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  FAMILY HISTORY:  Significant for father had a hemorrhagic stroke as noted above.  Mother with hypertension and coronary artery disease, but she died in her 33s.  Brother with diabetes.  REVIEW OF SYSTEMS:  Other than noted above, complete review of systems unremarkable.  PHYSICAL EXAMINATION:  GENERAL:  A healthy-looking elderly Caucasian lady looks much looks younger than her stated age, lying in the bed, not distressed.  She is slender build. VITAL SIGNS:  Temperature is 98.4, pulse 65, respirations 18, blood pressure 130/80.  She is saturating at 95% on room  air. HEENT: Her pupils are round and reactive.  She has no nystagmus.  Mucous membranes are pink and anicteric. NECK:  No cervical lymphadenopathy or thyromegaly.  No carotid bruit. CHEST:  Clear to auscultation bilaterally. CARDIOVASCULAR:  Regular rhythm.  No murmur. ABDOMEN:  Obese, soft, and nontender. EXTREMITIES:  Without edema.  She has 2+ dorsalis pedis pulses bilaterally. CENTRAL NERVOUS SYSTEM:  Cranial nerves II through XII are grossly intact.  She has no focal or lateralizing signs.  LABORATORY DATA:  White count was 12.3, hemoglobin 15.8, platelets 400. Absolute neutrophil count is 8.5.  Her sodium is 135, potassium 3.6, chloride 99, CO2 28, glucose 94, BUN 7, creatinine 0.6, calcium 9.4. Her PT/PTT and INR all normal.  Urinalysis is completely normal with specific gravity of 1.006; negative for blood nitrites, proteins, or leukocyte esterase.  IMAGING:  CT scan of the head without contrast was unremarkable.  Two- view chest x-ray shows stable chest x-ray, no active lung disease.  ASSESSMENT: 1. Transient ischemic attack. 2. Questionable dehydration 3. Hypertension. 4. Obstructive sleep apnea. 5. Gastroesophageal reflux disease.  PLAN:  We will bring this lady on observation to complete workup of TIAs since she does have family history of an early stroke.  We will get an MRI, MRA of the brain.  We will hydrate her and hold her HCTZ for the time being, but we will otherwise continue her home antihypertensive. Since her potassium is borderline low, we will continue her home doses of potassium.  Other plans as per orders.     Karlyn Agee, M.D.     LC/MEDQ  D:  01/31/2011  T:  01/31/2011  Job:  [id number]  cc:   Darlin Coco, M.D. Fax: [phone]  Electronically Signed by Karlyn Agee M.D. on 02/06/2011 02:24:57 PM

## 2011-02-08 NOTE — Discharge Summary (Signed)
NAMEDURU, REIGER              ACCOUNT NO.:  [id number]  MEDICAL RECORD NO.:  [id number]           PATIENT TYPE:  I  LOCATION:  TC09                         FACILITY:  [facility]  PHYSICIAN:  Charlynne Cousins, M.D.DATE OF BIRTH:  1941-05-19  DATE OF ADMISSION:  01/31/2011 DATE OF DISCHARGE:  02/02/2011                              DISCHARGE SUMMARY   PRIMARY CARE DOCTOR:  Darlin Coco, MD  DISCHARGE DIAGNOSES: 1. Probable transient ischemic attack. 2. Hypertension. 3. Gastroesophageal reflux disease.  DISCHARGE MEDICATIONS: 1. Tylenol 650 mg q.4 h. p.r.n. 2. Plavix 75 mg p.o. daily. 3. Crestor 20 mg p.o. daily. 4. Coenzyme Q10 one tablet daily. 5. Klor-Con 20 mEq daily. 6. Amlodipine 5 mg daily. 7. Omega-3 one capsule daily. 8. Hydrochlorothiazide 25 mg daily. 9. Prilosec 20 mg daily. 10.Vitamin B complex one tablet daily. 11.Vitamin C one tablet daily.  PROCEDURES PERFORMED: 1. CT angio of the neck that shows there is no evidence of hemodynamic     significant stenosis involve either carotid bifurcation.  There is     testicle vertical-cervical segment of the internal carotid artery,     is ectatic bilateral, more notable on the right, no evidence of     dissection. 2. CT angio of the head show more atherosclerotic-type changes     cavernous segment of the carotid artery bilateral without     hemodynamic significant stability. 3. MRI of the head show no acute reversible finding, mild chronic-     appearing small vessel type changes. 4. CT of head show unremarkable CT scan. 5. A 2-D echo showed left ventricular function was 55-60%, no wall     motion parameters consistent with diastolic heart failure or aortic     valve.  There was no stenosis on mitral valve, no regurgitation.     Left atrium was mildly dilated.  Right ventricular cavity was     mildly dilated.  Pulmonary artery pressure was unable to a     estimated.  The IVC was dilated.  CONSULTANTS:   None.  BRIEF ADMITTING HISTORY AND PHYSICAL:  This is a 70 year old female with past medical history of hypertension, GERD, also obstructive apnea, who said the pressure on her CPAP was recently adjusted.  This morning, she was feeling that her tongue was very thick and her mouth was very dry. She noted she had a swimmy head.  She describes as her eyes thinning. Her head seems to be thinning when her eye closed.  When she try to walk, she fall.  She is falling towards the left.  She presumed this was dehydration, decided to drink some, but did not improve.  About 10 a.m., she called her friend to bring her some Gatorade and after drink lot of Gatorade, she had to eat something and she felt better; however, she was not feeling quite right.  She still felt the dizziness.  In the afternoon, she called her doctor's office and eventually he advised her to check a blood pressure and the nurse came over to evaluate her blood pressure, her daughter also came over and she said what appeared to be  her speech was somewhat garbled and that she felt the right side of her face looked little bit droopy after preliminary evaluation.  In the ED, TIA was suspected and we are asked to admit and further evaluate.  PHYSICAL EXAMINATION:  VITAL SIGNS:  Temperature 98, pulse 65, respirations 18, blood pressure 130/80, she was satting 95% on room air. GENERAL:  She is awake, alert, and oriented x3, in no acute distress. HEENT:  Pupils are equally round and reactive to light.  No nystagmus. Mucous membranes are pink and anicteric.  Eyes; no PERRLA. NECK:  Supple.  No JVD.  No lymphadenopathy.  No bruits. CARDIOVASCULAR:  Regular rate and rhythm with positive S1 and S2.  No murmurs, rubs, or gallops. LUNGS:  Good air movement.  Clear to auscultation. ABDOMEN:  Positive bowel sounds, nontender, nondistended, and soft. EXTREMITIES:  Positive pulses without clubbing, cyanosis or edema. NEUROLOGIC:  Cranial nerves  III to XII are grossly intact.  She has no focal lateralization signs.  LABS ON ADMISSION:  White count 12.3, hemoglobin of 15, platelet count of 400, ANC of 8.5.  Her sodium was 135, potassium 3.6, chloride 99, bicarb of 28, glucose of 94, BUN of 7, creatinine 0.6, calcium 9.4.  Her PT and INR are normal.  UA shows no signs of infection.  Imaging as results above.  BRIEF HOSPITAL COURSE: 1. Probable transient ischemic attack.  She was taking aspirin.  This     was stopped.  A 2-D echo was done with the results above.  She was     monitored on telemetry and her EKG just shows premature ventricular     contractions.  MRI was done that showed results above.  So, the     workup and 2-D echo results above.  Fasting lipid panel was     checked, which showed an HDL 45 and an LDL of 79.  So, her workup     was mostly negative.  It is concern as her telemetry strip     sometimes shows premature ventricular contractions.  Her EKG shows     sinus rhythm with occasional premature ventricular beats.  She will     follow up with Dr. Leonie Man as an outpatient to determine whether she     needs Doppler and she will also follow up with Dr. Mare Ferrari, who     is her cardiologist.  Right now, she is currently on Plavix as she     failed her aspirin regimen.  She is nonfocal at this time in stable     condition. 2. Hypertension, currently well controlled.  No changes were made. 3. Gastroesophageal reflux disease.  Continue PPI.  DISPOSITION:  The patient will follow up with Dr. Mare Ferrari in 1-2 weeks.  Here, we will get another EKG and see how she is doing from cardiac standpoint, whether she needs a Doppler.  She will also follow up with Dr. Leonie Man as an outpatient.  I have discussed this with Dr. Brett Fairy and she recommended follow with Dr. Leonie Man in 1 week period in order for further evaluation.  Vital signs on the day of discharge shows temperature 97, pulse 68, blood pressure 134/78, respirations 18.   She was satting 96% on room air.  Labs on the day of discharge shows iron greater than 1.  Her sodium was 141, potassium 3.9, chloride 109, bicarb 27, glucose of 97, BUN of 7, creatinine 0.7, calcium 9.7.  Hemoglobin A1c is 5.9.  Her fasting lipid panel  showed cholesterol 140, triglycerides of 80, HDL 45, LDL 79. Cardiac enzymes negative x3.     Charlynne Cousins, M.D.     AF/MEDQ  D:  02/02/2011  T:  02/02/2011  Job:  [id number]  cc:   Darlin Coco, M.D. Fax: [phone]  Pramod P. Leonie Man, MD Fax: [phone]  Electronically Signed by Bess Harvest M.D. on 02/08/2011 10:23:00 AM

## 2011-02-14 ENCOUNTER — Ambulatory Visit (INDEPENDENT_AMBULATORY_CARE_PROVIDER_SITE_OTHER): Payer: MEDICARE | Admitting: Cardiology

## 2011-02-14 DIAGNOSIS — R42 Dizziness and giddiness: Secondary | ICD-10-CM

## 2011-02-14 DIAGNOSIS — R002 Palpitations: Secondary | ICD-10-CM

## 2011-03-09 ENCOUNTER — Institutional Professional Consult (permissible substitution): Payer: MEDICARE | Admitting: Pulmonary Disease

## 2011-03-17 ENCOUNTER — Encounter: Payer: Self-pay | Admitting: Internal Medicine

## 2011-03-22 ENCOUNTER — Encounter: Payer: Self-pay | Admitting: Internal Medicine

## 2011-03-22 ENCOUNTER — Ambulatory Visit (INDEPENDENT_AMBULATORY_CARE_PROVIDER_SITE_OTHER): Payer: MEDICARE | Admitting: Internal Medicine

## 2011-03-22 VITALS — BP 116/76 | HR 85 | Ht 64.0 in | Wt 170.4 lb

## 2011-03-22 DIAGNOSIS — G4700 Insomnia, unspecified: Secondary | ICD-10-CM | POA: Insufficient documentation

## 2011-03-22 DIAGNOSIS — J42 Unspecified chronic bronchitis: Secondary | ICD-10-CM

## 2011-03-22 DIAGNOSIS — K219 Gastro-esophageal reflux disease without esophagitis: Secondary | ICD-10-CM

## 2011-03-22 DIAGNOSIS — G4733 Obstructive sleep apnea (adult) (pediatric): Secondary | ICD-10-CM

## 2011-03-22 DIAGNOSIS — R202 Paresthesia of skin: Secondary | ICD-10-CM

## 2011-03-22 DIAGNOSIS — R209 Unspecified disturbances of skin sensation: Secondary | ICD-10-CM

## 2011-03-22 MED ORDER — GABAPENTIN 300 MG PO CAPS: ORAL_CAPSULE | ORAL | Status: DC

## 2011-03-22 NOTE — Progress Notes (Signed)
  Subjective:    Patient ID: Maria [name], female    DOB: Sep 08, 1941, 70 y.o.   MRN: [id number]  HPI 70 yoF referred in sleep medicine consultation courtesy of Dr Mare Ferrari. She complains of poor quality, nonrestorative sleep with difficulty initiiating and maintaining sleep, restless legs, cyclical surges of pain that feel like icewater in her veins. This has gone on for 15 years. Every 3-6 hours of every night while trying to sleep, she has been disturbed by these paresthesias which are not cramps and not hot flashes. They are most disturbing when trying to sleep, but not limited to sleep.  She was dx'd by Dr Jaynie Collins with sleep apnea 4 years ago. An NPSG then is not yet available. CPAP from [employer], 9 cwp. Sleeps well with CPAP and compliance good. Complains of insomnia separate from the sleep apnea pattern. If she falls asleep in her chair, then apnea and shallow breathing wake her.  Hx of GERD- slept up in recliner for years. Now avoids due to back and leg pain with prolonged sitting.     Review of Systems See HPI Constitutional:   No weight loss, night sweats,  Fevers, chills, fatigue, lassitude. HEENT:   No headaches,  Difficulty swallowing,  Tooth/dental problems,  Sore throat,                No sneezing, itching, ear ache, nasal congestion, post nasal drip,   CV:  No chest pain,  Orthopnea, PND, swelling in lower extremities, anasarca, dizziness, palpitations  GI  No heartburn, indigestion, abdominal pain, nausea, vomiting, diarrhea, change in bowel habits, loss of appetite  Resp: No shortness of breath with exertion or at rest.  No excess mucus. Routine morning cough blamed on "dryness"  No coughing up of blood.  No change in color of mucus.  No wheezing.  No chest wall deformity  Skin: no rash or lesions.  GU: no dysuria, change in color of urine, no urgency or frequency.  No flank pain.  MS:  No joint pain or swelling.  No decreased range of motion.  No back pain.  Psych:  No  change in mood or affect. No depression or anxiety.  No memory loss.      Objective:   Physical Exam General- Alert, Oriented, Affect-appropriate, Distress- none acute  Skin- rash-none, lesions- none, excoriation- none  Lymphadenopathy- none  Head- atraumatic  Eyes- Gross vision intact, PERRLA, conjunctivae clear, secretions  Ears- Normal- Hearing, canals, Tm L ,   R ,  Nose- Clear, No- Septal dev, mucus, polyps, erosion, perforation   Throat- Mallampati III , mucosa clear , drainage- none, tonsils- atrophic  Neck- flexible , trachea midline, no stridor , thyroid nl, carotid no bruit  Chest - symmetrical excursion , unlabored     Heart/CV- RRR , no murmur , no gallop  , no rub, nl s1 s2                     - JVD- none , edema- none, stasis changes- none, varices- none     Lung- clear to P&A, wheeze- none, cough- none , dullness-none, rub- none     Chest wall- atraumatic, no scar  Abd- tender-no, distended-no, bowel sounds-present, HSM- no  Br/ Gen/ Rectal- Not done, not indicated  Extrem- cyanosis- none, clubbing, none, atrophy- none, strength- nl  Neuro- grossly intact to observation         Assessment & Plan:

## 2011-03-22 NOTE — Patient Instructions (Addendum)
Order- Huey Romans- change her CPAP humidifier to heated humidifer  [facility] would like to establish a primary physician- hx GERD, chronic bronchitis  Script to try gabapentin 300 mg twice daily for the burning leg paresthesias

## 2011-03-27 ENCOUNTER — Encounter: Payer: Self-pay | Admitting: Internal Medicine

## 2011-03-28 ENCOUNTER — Other Ambulatory Visit: Payer: Self-pay | Admitting: Cardiology

## 2011-03-28 ENCOUNTER — Telehealth: Payer: Self-pay | Admitting: *Deleted

## 2011-03-28 DIAGNOSIS — G459 Transient cerebral ischemic attack, unspecified: Secondary | ICD-10-CM

## 2011-03-28 NOTE — Assessment & Plan Note (Signed)
Difficulty initiating and maintaining sleep. There are some somatic discomforts which can be addressed. Before leaning on direct therapies, we need to understand whether OSA is still important. We don't want to sedate her respiratory drive.

## 2011-03-28 NOTE — Telephone Encounter (Signed)
Refill done.  

## 2011-03-28 NOTE — Assessment & Plan Note (Addendum)
We will update NPSG as discussed. I have reviewed medical issues of significant OSA. We are requesting old sleep studies. She complains of dry mouth from CPAP and we may make her more comfortable with a heated humidifier.

## 2011-03-28 NOTE — Telephone Encounter (Signed)
PT DIZZY, WANTS TO SW NURSE. PLACED CHART IN BOX.

## 2011-03-28 NOTE — Telephone Encounter (Signed)
Per Dr. Star Age is c/o of inner ear problems;  She has Meclizine at home for PRN use; she is wondering if she should see her neurologist-per TAB-this will be fine.  Pt advised

## 2011-03-28 NOTE — Assessment & Plan Note (Addendum)
Not sure of etiology of "ice water " paresthesias, unless it relates to the low back pain.Will try gabapentin for paresthesias to improve sleep quality.

## 2011-04-10 ENCOUNTER — Encounter: Payer: Self-pay | Admitting: Internal Medicine

## 2011-04-10 ENCOUNTER — Telehealth: Payer: Self-pay | Admitting: Cardiology

## 2011-04-10 DIAGNOSIS — R05 Cough: Secondary | ICD-10-CM

## 2011-04-10 MED ORDER — AZITHROMYCIN 250 MG PO TABS: ORAL_TABLET | ORAL | Status: AC

## 2011-04-10 NOTE — Telephone Encounter (Signed)
Has had a chest cold for the last week. Has taken regular musinex and might need more help. Please call back. I have pulled the chart.

## 2011-04-10 NOTE — Telephone Encounter (Signed)
Productive cough, using mucinex twice a day.  Pharmacist said she could take every six hours, advised ok but not to exceed what bottle says.  Patient stated she felt very tired from all the coughing and just feeling bad.  Called in a Zpak, call back if worse or no better

## 2011-04-11 ENCOUNTER — Telehealth: Payer: Self-pay | Admitting: Cardiology

## 2011-04-11 NOTE — Telephone Encounter (Signed)
Agree with plan 

## 2011-04-11 NOTE — Telephone Encounter (Signed)
Pharmacy claims they didn't receive the prescription order from [facility] yesterday. [facility].

## 2011-04-17 ENCOUNTER — Encounter: Payer: Self-pay | Admitting: Internal Medicine

## 2011-04-19 NOTE — Telephone Encounter (Signed)
Rx. Called to pharm 04/12/2011

## 2011-04-26 ENCOUNTER — Other Ambulatory Visit: Payer: Self-pay | Admitting: Cardiology

## 2011-04-26 DIAGNOSIS — G459 Transient cerebral ischemic attack, unspecified: Secondary | ICD-10-CM

## 2011-04-26 NOTE — Telephone Encounter (Signed)
escribe request

## 2011-04-27 ENCOUNTER — Telehealth: Payer: Self-pay | Admitting: Cardiology

## 2011-04-27 NOTE — Telephone Encounter (Signed)
Needs early appointment than 6/27 Dental end of may , needs clearance

## 2011-05-02 NOTE — Op Note (Signed)
Maria [name], Maria [name]              ACCOUNT NO.:  [id number]   MEDICAL RECORD NO.:  [id number]          PATIENT TYPE:  INP   LOCATION:  5028                         FACILITY:  [facility]   PHYSICIAN:  Estill Bamberg. Ronnie Derby, M.D. DATE OF BIRTH:  1941/06/13   DATE OF PROCEDURE:  DATE OF DISCHARGE:                               OPERATIVE REPORT   SURGEON:  Estill Bamberg. Ronnie Derby, MD   ASSISTANTS:  1. Nehemiah Massed, PA  2. Carlynn Spry, PA-C   ANESTHESIA:  General.   PREOPERATIVE DIAGNOSIS:  Left knee osteoarthritis.   POSTOPERATIVE DIAGNOSIS:  Left knee osteoarthritis.   PROCEDURE:  Left total knee arthroplasty.   INDICATIONS FOR PROCEDURE:  The patient is a 70 year old with failure of  conservative measures for osteoarthritis of the left knee.  Informed  consent was obtained.   DESCRIPTION OF PROCEDURE:  The patient was laid supine, administered  general anesthesia.  A Foley catheter placed.  Left knee was prepped and  draped in the usual sterile fashion.  The extremity was exsanguinated  with the Esmarch and tourniquet inflated to 350 mmHg.  Midline incision  was made with a #10 blade.  A new blade was used to make a median  parapatellar arthrotomy and performed synovectomy.  I then reverted the  patella and measured at 21 mm thick, reamed down to 9 mm, drilled  through lug holes through the 29-mm template.  Prosthetic trial in place  measured 21 mm.  Removed the prosthetic trial and went into flexion  subluxing the patella laterally.  Using the extramedullary alignment  system on the tibia, the intramedullary alignment system on the femur,  made a perpendicular cut to the anatomic axis along the tibia and a 6  degrees valgus cut on the femur since this was a varus knee.  I then  marked out the epicondylar axis, posterior condylar angle measured 3  degrees.  I then sized to a size D pinned through 3 degrees external  rotation holes.  Made anteroposterior chamfer cuts through the 4-in-1  cutting block.  Then placed a lamina spreader in the knee, removed the  ACL, PCL, medial and lateral menisci, and posterior condylar  osteophytes.  I then put the 12-mm spacer block and had a good  flexion/extension gap balance.  I then sized the femur with a size D  finishing block, sized the tibia with a size 3 tibial tray drilling  keel.  I then trialed with a 3 tibia, D femur, 12 insert, 29 patella,  and had good flexion/extension gap balance and excellent patellar  tracking.  I then removed the trial components, copiously irrigated.  I  then cemented in the components, removed the excess cement allowing the  cement to harden in extension.  I placed a Hemovac coming out  superolaterally and deep to the arthrotomy and pain catheter coming out  superomedially and superficially to the arthrotomy.  I then closed the  arthrotomy with figure-of-eight 1 Vicryl sutures.  After the tourniquet  was deflated, hemostasis was obtained.  I irrigated it once again and  then closed the deep soft tissues with  buried 0 Vicryls, the  subcuticular with 3-0 Vicryl, and then skin staples.  Dressed with  Xeroform dressing, sponges, sterile Webril, and TED stockings.   COMPLICATIONS:  None.   DRAINS:  One Hemovac and one pain catheter.   300 mL of blood loss.   TOURNIQUET TIME:  49 minutes.            ______________________________  Estill Bamberg Ronnie Derby, M.D.     SDL/MEDQ  D:  07/20/2008  T:  07/21/2008  Job:  [id number]

## 2011-05-02 NOTE — Assessment & Plan Note (Signed)
[facility]                                 ON-CALL NOTE   NAME:[name], Maria MEHTA                     MRN:          [id number]  DATE:10/14/2010                            DOB:          05/06/1941    She is a patient of Dr. Sherryl Barters.  Maria [name] called this evening  stating that for the past 5 days, she has had dysuria that has not  improved and in fact has worsened.  Her urine is now malodorous.  She  believes she has an UTI and is asking for me to call in a prescription  for an antibiotic.  I explained that I would prefer that she present  either to an urgent care, minute clinic, or ED for urinalysis and  culture and formal evaluation prior to me just prescribing something  over the phone.  She was thankful for the callback.     Murray Hodgkins, ANP     CB/MedQ  DD: 10/14/2010  DT: 10/15/2010  Job #: [id number]

## 2011-05-02 NOTE — Op Note (Signed)
Maria [name], Maria [name]              ACCOUNT NO.:  [id number]   MEDICAL RECORD NO.:  [id number]          PATIENT TYPE:  INP   LOCATION:  5009                         FACILITY:  [facility]   PHYSICIAN:  Estill Bamberg. Ronnie Derby, M.D. DATE OF BIRTH:  15-Jul-1941   DATE OF PROCEDURE:  01/13/2008  DATE OF DISCHARGE:                               OPERATIVE REPORT   SURGEON:  Estill Bamberg. Ronnie Derby, M.D.   ASSISTANTAaron Edelman D. Petrarca, P.A.-C.   ANESTHESIA:  General.   PREOPERATIVE DIAGNOSIS:  Right knee osteoarthritis.   POSTOPERATIVE DIAGNOSIS:  Right knee osteoarthritis.   PROCEDURE:  Right total knee arthroplasty.   INDICATIONS FOR PROCEDURE:  The patient is a 70 year old white female  who has failed conservative measures brought for arthroscopy of her  right knee.  Informed consent was obtained.   DESCRIPTION OF PROCEDURE:  The patient was placed supine and  administered general anesthesia after Foley catheter placement.  The  right leg was prepped and draped in usual sterile fashion.   A midline incision was made 6 inches in length __________.  The knee was  manipulated to perform a medial parapatellar arthrotomy and perform a  synovectomy.  I then elevated the deep MCL off of the medial crest of  the tibia.  I used the extramedullary alignment system on the tibia,  intramedullary alignment system on the femur.  Made a perpendicular cut  to the anatomic axis of the tibia and 60 degree valgus cut to the  anatomic axis of the femur.  __________ epicondylar axis on the femur,  sized to a size E, pinned to 3 degrees external rotation holes.  I made  the anterior, posterior, and chamfer cuts.  We then placed a laminar  spreader in the knee and removed the ACL, PCL, medial, and lateral  menisci and posterior condylar osteophytes.  I then placed a 10-mm  spacer block, and the knee had good flexion and extension gap balance.  We then finished the femur with a size E finishing block, the tibia with  a  size 4 tibial tray drilling keel.  We then trialed with a size E  femur, size 4 tibia, size 10 insert and 32 patella.  I chose these  components and copiously irrigated after removal of the trial  components.  We then cemented in the components and removed excess  cement.  We allowed the cement to harden in extension.  I then placed a  Hemovac coming out superolaterally deep to the arthrotomy, pain catheter  coming out supermedial and superficial to the arthrotomy.  We then  closed the arthrotomy with figure-of-eight #1 Vicryl sutures, deep  buried 0 Vicryl sutures, subcuticular 2-0 Vicryl stitch and skin  staples.  I dressed with Xeroform dressing, sponges, sterile Webril, and  a TED stocking.   COMPLICATIONS:  None.   DRAINS:  One Hemovac, one pain catheter.   ESTIMATED BLOOD LOSS:  300 mL.   TOURNIQUET TIME:  46 minutes.           ______________________________  Estill Bamberg Ronnie Derby, M.D.     SDL/MEDQ  D:  01/16/2008  T:  01/17/2008  Job:  [id number]

## 2011-05-05 NOTE — Op Note (Signed)
[facility]  Patient:    Maria [name], Maria [name]                        MRN: [id number] Proc. Date: 05/31/00 Adm. Date:  [id number] Disc. Date: [id number] Attending:  Harvest Dark                           Operative Report  PREOPERATIVE DIAGNOSIS: 1. Stress urinary incontinence. 2. Status post pubovaginal sling procedure, November 2000. 3. Remote history of Burch laparoscopic procedure.  POSTOPERATIVE DIAGNOSIS: 1. Stress urinary incontinence. 2. Status post pubovaginal sling procedure, November 2000. 3. Remote history of Burch laparoscopic procedure.  OPERATION PERFORMED:  Cystoscopy and transurethral injection of bladder neck with Durasphere.  SURGEON:  Synthia Innocent, M.D.  ANESTHESIA:  DESCRIPTION OF PROCEDURE:  The patient was prepped and draped in dorsal lithotomy position under LMA anesthesia.  Cystoscopy was performed with the [facility] scope and injector unit.  The injector needle was inserted into the shaft in the scope and then using the camera, under direct vision, puncture sites were made at the 3 oclock, 9 oclock and 6 oclock position.  Five syringes of the Durasphere bulking agent were used to coapt the bladder neck.  We appeared to get a good result.  There was some extravasation as expected.  We drained the bladder with a 12 French Robinson catheter and she was taken back to the recovery room in satisfactory condition. DD:  06/01/99 TD:  06/04/00 Job: [id number]

## 2011-05-05 NOTE — Discharge Summary (Signed)
NAMEEBONE, ALCIVAR              ACCOUNT NO.:  [id number]   MEDICAL RECORD NO.:  [id number]          PATIENT TYPE:  INP   LOCATION:  5009                         FACILITY:  [facility]   PHYSICIAN:  Estill Bamberg. Ronnie Derby, M.D. DATE OF BIRTH:  1941-09-15   DATE OF ADMISSION:  01/13/2008  DATE OF DISCHARGE:  01/17/2008                               DISCHARGE SUMMARY   ADMISSION DIAGNOSIS:  Osteoarthritis right knee.   DISCHARGE DIAGNOSES:  1. Osteoarthritis right knee.  2. History of hypertension.  3. Sleep apnea.  4. Acute blood-loss anemia.   PROCEDURE:  Right total knee arthroplasty.   HISTORY:  A 70 year old female with several-year history of right knee  pain which is intermittent and sharp and severe.  She had associated  swelling and weakness.  She is worsening and is having difficulty with  ADLs.  Has failed conservative treatment.  Indicated now for total knee  replacement.   HOSPITAL COURSE:  This 70 year old female admitted January 13, 2008.  After appropriate laboratory studies were obtained as well as 1 gram  Ancef IV on-call to the operating room, she was taken to the operating  room where she underwent a right total knee arthroplasty.  She tolerated  the procedure well.  She was placed on a morphine PCA pump reduced-dose  protocol.  She was started on Lovenox 30 mg subcu q.12h. beginning  January 27 at 8 a.m.  Consults to PT, OT and care management were made  with weightbearing as tolerated.  CPM 0-90 degrees for 6-8 hours per  day.  Foley was placed intraoperatively.  She was allowed out of bed to  a chair the following day.  Her O2 was weaned off and she was started on  Norco 5/325 one to two tablets every four hours as needed for pain.  On  postoperative day #2 she had her dressing changed.  Her Foley was  discontinued.  On January 29 she had marked improvement.  On January 30  she was started on Celebrex 200 mg p.o. b.i.d.  She was discharged home  on January 30 after  Lovenox teaching.  EKG was read as normal.   LABORATORY STUDIES:  Admitted with hemoglobin 14.5; hematocrit 42.3%;  white count 7600; platelets 281,000.  Discharge hemoglobin 10.8;  hematocrit 31.1%; white count 9900; platelets were 258,000.  Preoperative sodium 138, potassium 3.8, chloride 101, CO2 29, glucose  95, BUN 11, creatinine 0.76.  Discharge sodium 139, potassium 3.6,  chloride 101, CO2 31, glucose 102, BUN 6, creatinine 0.74.  Preoperative  GFR was greater than 60 as well as at discharge.  Preoperative total  protein 7.1, albumin 4.2, AST 23, ALT 18, ALP 67, total bilirubin 0.8.  Urinalysis benign for a voided urine.  No growth on urine culture.  Blood type A positive, antibody screen negative.   DISCHARGE INSTRUCTIONS:  There are no restrictions to diet.  Follow the  blue instruction sheet.  Increase her activity slowly.  May use a  walker, weightbearing as tolerated.  No lifting or driving.   Prescriptions for:  1. Lovenox 40 mg inject subcutaneously, last dose  being on January 27, 2008.  2. Norco 5/325 one to two tablets every 4 hours as needed for pain.  3. Robaxin 500 mg one or two tabs every 6-8 hours as needed for      spasms.  4. Celebrex 200 mg one tablet b.i.d. for one week.   She will follow back with Dr. Ronnie Derby on January 28, 2008.  Discharged in  improved condition.      Mike Craze Petrarca, P.A.-C.    ______________________________  Estill Bamberg. Ronnie Derby, M.D.    BDP/MEDQ  D:  02/14/2008  T:  02/15/2008  Job:  [id number]

## 2011-05-05 NOTE — Discharge Summary (Signed)
Maria [name], Maria [name]              ACCOUNT NO.:  [id number]   MEDICAL RECORD NO.:  [id number]          PATIENT TYPE:  INP   LOCATION:  5028                         FACILITY:  [facility]   PHYSICIAN:  Estill Bamberg. Ronnie Derby, M.D. DATE OF BIRTH:  Mar 13, 1941   DATE OF ADMISSION:  07/20/2008  DATE OF DISCHARGE:  07/23/2008                               DISCHARGE SUMMARY   ADMISSION DIAGNOSIS:  Osteoarthritis, left knee.   DISCHARGE DIAGNOSES:  1. Osteoarthritis of the left knee, status post left total knee      arthroscopy.  2. History of hypertension.  3. Sleep apnea.  4. Acute blood loss anemia.   PROCEDURE:  Left total knee arthroscopy.   HISTORY:  Maria [name] is a 70 year old female with several-year history of  left knee pain, which is intermittent, sharp, and severe with associated  swelling and weakness.  The patient had a past medical history  significant for a right total knee arthroscopy in January 2009.  The  patient has failed conservative treatment.  The pain is worsening.  Risks and benefits of surgery were discussed with the patient, and the  patient wished to proceed with left total knee arthroscopy.  On the day  of surgery, the patient was admitted on July 20, 2008, after  appropriate laboratory studies were obtained as well as 1 g of Ancef IV  on call to the operating room.  She was taken to the operating room,  where she underwent a left total knee arthroscopy.  She tolerated the  procedure well.  She was placed on a Dilaudid PCA pump reduced-dose  protocol.  She was started on Lovenox 30 mg subcutaneously q.12 h.  beginning July 21, 2008, at 8 a.m.  Consults with PT, OT, and care  management were made with weightbearing as tolerated.  CPM 0-90 degrees  for 6-8 hours per day.  The Foley was placed intraoperatively.  She was  allowed out of bed to chair starting the following day.  On  postoperative day #1, the patient was started on physical therapy and  was progressing  well.  Postoperative day #1, her labs were stable.  Vital signs were within normal limits.  Sodium was 137, and potassium  was 3.7.  The patient was alert and oriented x3.  Negative Homans sign.  No complaints at this time.  She was happily sitting in the chair.  CPM  0-90 degrees.  On postoperative day #2, the patient states __________ as  she did have a history of hypokalemia, and she was kept __________.  All  other vital signs were stable.  The patient had no complaints, had a  negative Homans sign, and was 0-90 at CPM.  The patient was to be  discharged home that day.  The patient had met all goals of physical  therapy.   DISCHARGE INSTRUCTIONS:  There are no restrictions for diet.  She can  follow the blue sheet instructions for her wound care and it was given  to the patient.  She is to increase her activity slowly.  She may use a  walker, CPM, and home  health PT as well as a 3-in-1.  The patient is to  weight bear as tolerated and no lifting or driving.  She was given  prescriptions for Lovenox 40 mg injection subcu __________ being 14 days  after surgery.  She was also given a prescription for Percocet 5/325, 1-  2 tablets every 4 hours as needed for pain.  She was given prescriptions  for  Robaxin 500 mg 1-2 tabs every 6 hours as needed for spasm.  She will  follow up with Dr. Ronnie Derby in 2 weeks.  She is to call for the appointment  at [phone].  Date of her followup appointment is August 04, 2008.  The  patient was discharged in improved condition.     ______________________________  Carlynn Spry, PA-C    ______________________________  Estill Bamberg. Ronnie Derby, M.D.    MJ/MEDQ  D:  08/14/2008  T:  08/14/2008  Job:  [id number]

## 2011-05-10 ENCOUNTER — Ambulatory Visit (INDEPENDENT_AMBULATORY_CARE_PROVIDER_SITE_OTHER): Payer: Medicare Other | Admitting: Cardiology

## 2011-05-10 ENCOUNTER — Encounter: Payer: Self-pay | Admitting: Cardiology

## 2011-05-10 DIAGNOSIS — R002 Palpitations: Secondary | ICD-10-CM

## 2011-05-10 DIAGNOSIS — H8110 Benign paroxysmal vertigo, unspecified ear: Secondary | ICD-10-CM | POA: Insufficient documentation

## 2011-05-10 DIAGNOSIS — E785 Hyperlipidemia, unspecified: Secondary | ICD-10-CM | POA: Insufficient documentation

## 2011-05-10 DIAGNOSIS — G459 Transient cerebral ischemic attack, unspecified: Secondary | ICD-10-CM | POA: Insufficient documentation

## 2011-05-10 NOTE — Assessment & Plan Note (Signed)
The patient has a past history of a suspected TIA 3 months ago.  She has been on empiric aspirin and Plavix.  She's had no further episodes.  She has some upcoming dental surgery and we will have her hold her aspirin andPlavix for one week prior to the procedure.  She will take her last dose of aspirin and Plavix on June 4 and her procedure is scheduled for June 11

## 2011-05-10 NOTE — Progress Notes (Signed)
Maria [name] Date of Birth:  [birth year] [facility] [address]   [place],   [address] [phone]           Fax   [phone]  History of Present Illness: This pleasant 70 year old woman is seen for a scheduled followup office visit.  He has a past history of palpitations.  She has been doing well since last visit.  She has been exercising more regularly.  She has not been expressing any recent chest pain or shortness of breath.  She does not have any history of known ischemic heart disease.  She had a normal stress thallium on 04/19/09.  She had a echocardiogram on 03/07/10 which showed normal left ventricular systolic and diastolic function and normal Pulmonary artery pressure.  Current Outpatient Prescriptions  Medication Sig Dispense Refill  . amLODipine (NORVASC) 5 MG tablet Once a daily       . aspirin 81 MG tablet Take 81 mg by mouth daily.        Marland Kitchen b complex vitamins tablet Take 1 tablet by mouth daily.        Marland Kitchen CALCIUM PO Take by mouth daily.        . Cholecalciferol (VITAMIN D-3 PO) Take by mouth daily.        . fish oil-omega-3 fatty acids 1000 MG capsule Take by mouth daily.        . hydrochlorothiazide 25 MG tablet Once a day       . metoprolol tartrate (LOPRESSOR) 25 MG tablet Take 12.5 mg by mouth 2 (two) times daily.        Marland Kitchen omeprazole (PRILOSEC) 40 MG capsule 1 by mouth once a day 30-60 minutes before a meal       . PLAVIX 75 MG tablet TAKE 1 TABLET BY MOUTH EVERY DAY  30 tablet  11  . potassium chloride (KLOR-CON) 20 MEQ packet Once a day       . simvastatin (ZOCOR) 20 MG tablet Take 20 mg by mouth at bedtime.        Marland Kitchen DISCONTD: gabapentin (NEURONTIN) 300 MG capsule 1 twice daily- breakfast and bedtime  60 capsule  11    Allergies  Allergen Reactions  . Ace Inhibitors   . Bee     Bee stings-carry an EPIPEN  . Benicar (Olmesartan Medoxomil)   . Codeine     REACTION: nausea  . Lisinopril     Patient Active  Problem List  Diagnoses  . ESOPHAGEAL STRICTURE  . GERD  . Obstructive sleep apnea  . Paresthesias  . Insomnia  . Palpitations  . Hyperlipidemia  . TIA (transient ischemic attack)  . Vertigo, benign paroxysmal    History  Smoking status  . Former Smoker -- 1.0 packs/day for 10 years  . Types: Cigarettes  . Quit date: 12/18/1969  Smokeless tobacco  . Not on file    History  Alcohol Use  . 0.0 - 1.0 oz/week  . 0-2 drink(s) per week    ocassional use    Family History  Problem Relation Age of Onset  . Diabetes Father   . Heart disease Father   . Kidney disease Father   . Hypertension Father   . Diabetes Brother   . Heart disease Mother   . Colon polyps Mother   . Colon cancer Maternal Grandmother   . Irritable bowel syndrome Daughter     Review of Systems: Constitutional: no fever chills diaphoresis or  fatigue or change in weight.  Head and neck: no hearing loss, no epistaxis, no photophobia or visual disturbance. Respiratory: No cough, shortness of breath or wheezing.History of sleep apnea and has CPAP machine and is followed by Dr. Annamaria Boots Cardiovascular: No chest pain peripheral edema, palpitations. Gastrointestinal: No abdominal distention, no abdominal pain, no change in bowel habits hematochezia or melena. Genitourinary: No dysuria, no frequency, no urgency, no nocturia. Musculoskeletal:No arthralgias, no back pain, no gait disturbance or myalgias. Neurological: No dizziness, no headaches, no numbness, no seizures, no syncope, no weakness, no tremors. Hematologic: No lymphadenopathy, no easy bruising. Psychiatric: No confusion, no hallucinations, no sleep disturbance.    Physical Exam: Filed Vitals:   05/10/11 1539  BP: 126/70  Pulse: 77  The general appearance reveals a well-developed well-nourished woman in no distress.  Pupils equal and reactive.   Extraocular Movements are full.  There is no scleral icterus.  The mouth and pharynx are normal.  The  neck is supple.  The carotids reveal no bruits.  The jugular venous pressure is normal.  The thyroid is not enlarged.  There is no lymphadenopathy.The chest is clear to percussion and auscultation. There are no rales or rhonchi. Expansion of the chest is symmetrical.The precordium is quiet.  The first heart sound is normal.  The second heart sound is physiologically split.  There is no murmur gallop rub or click.  There is no abnormal lift or heave.The abdomen is soft and nontender. Bowel sounds are normal. The liver and spleen are not enlarged. There Are no abdominal masses. There are no bruits.  Normal extremity without phlebitis or edema.  Pedal pulses are present.The skin is warm and dry.  There is no rash.Strength is normal and symmetrical in all extremities.  There is no lateralizing weakness.  There are no sensory deficits.   Assessment / Plan: Hold aspirin and Plavix for one week prior to upcoming dental surgery.  Resume aspirin and Plavix as soon as possible post surgery.  Recheck in 4 months for followup office visit.

## 2011-05-10 NOTE — Patient Instructions (Signed)
Hold ASA and Plavix after 05/22/11 for dental surgery.

## 2011-05-10 NOTE — Assessment & Plan Note (Signed)
The patient has a past history of palpitations.  She does not have any history of known coronary disease.  She had a normal stress thallium on 04/19/09 which showed no ischemia and showed an ejection fraction of 65%.  The patient had an echocardiogram 03/07/10 which showed mild aortic sclerosis with trivial aortic insufficiency and normal left ventricular systolic and diastolic function.  Pulmonary artery pressure was normal.Since we last saw her her palpitations have decreased and she is tolerating her present medications.  She is increasing her activity.  She goes to the [facility] 3 days a week for water aerobics and she works out in [employer] additionally

## 2011-05-12 ENCOUNTER — Other Ambulatory Visit: Payer: Self-pay | Admitting: Cardiology

## 2011-05-12 DIAGNOSIS — Z1231 Encounter for screening mammogram for malignant neoplasm of breast: Secondary | ICD-10-CM

## 2011-05-21 ENCOUNTER — Encounter: Payer: Self-pay | Admitting: Internal Medicine

## 2011-05-21 DIAGNOSIS — Z0001 Encounter for general adult medical examination with abnormal findings: Secondary | ICD-10-CM | POA: Insufficient documentation

## 2011-05-21 DIAGNOSIS — Z0000 Encounter for general adult medical examination without abnormal findings: Secondary | ICD-10-CM | POA: Insufficient documentation

## 2011-05-22 ENCOUNTER — Other Ambulatory Visit: Payer: Self-pay | Admitting: Cardiology

## 2011-05-22 DIAGNOSIS — I119 Hypertensive heart disease without heart failure: Secondary | ICD-10-CM

## 2011-05-22 NOTE — Telephone Encounter (Signed)
escribe request  

## 2011-05-24 ENCOUNTER — Encounter: Payer: Self-pay | Admitting: Internal Medicine

## 2011-05-24 ENCOUNTER — Ambulatory Visit (INDEPENDENT_AMBULATORY_CARE_PROVIDER_SITE_OTHER): Payer: Medicare Other | Admitting: Internal Medicine

## 2011-05-24 ENCOUNTER — Other Ambulatory Visit (INDEPENDENT_AMBULATORY_CARE_PROVIDER_SITE_OTHER): Payer: Medicare Other

## 2011-05-24 ENCOUNTER — Ambulatory Visit (HOSPITAL_COMMUNITY): Payer: Medicare Other

## 2011-05-24 VITALS — BP 102/62 | HR 65 | Temp 98.2°F | Ht 64.0 in | Wt 167.5 lb

## 2011-05-24 DIAGNOSIS — R7309 Other abnormal glucose: Secondary | ICD-10-CM

## 2011-05-24 DIAGNOSIS — R7302 Impaired glucose tolerance (oral): Secondary | ICD-10-CM

## 2011-05-24 DIAGNOSIS — I10 Essential (primary) hypertension: Secondary | ICD-10-CM

## 2011-05-24 DIAGNOSIS — Z0000 Encounter for general adult medical examination without abnormal findings: Secondary | ICD-10-CM

## 2011-05-24 DIAGNOSIS — G43909 Migraine, unspecified, not intractable, without status migrainosus: Secondary | ICD-10-CM | POA: Insufficient documentation

## 2011-05-24 LAB — LIPID PANEL
HDL: 52.2 mg/dL (ref >39.00)
LDL Cholesterol: 67 mg/dL (ref 0–99)
Total CHOL/HDL Ratio: 3
VLDL: 12.8 mg/dL (ref 0.0–40.0)

## 2011-05-24 LAB — URINALYSIS, ROUTINE W REFLEX MICROSCOPIC
Ketones, ur: NEGATIVE
Specific Gravity, Urine: 1.015 (ref 1.000–1.030)
Urine Glucose: NEGATIVE
pH: 7.5 (ref 5.0–8.0)

## 2011-05-24 LAB — CBC WITH DIFFERENTIAL/PLATELET
Basophils Absolute: 0 10*3/uL (ref 0.0–0.1)
Eosinophils Relative: 1.3 % (ref 0.0–5.0)
HCT: 44.1 % (ref 36.0–46.0)
Hemoglobin: 14.8 g/dL (ref 12.0–15.0)
Lymphs Abs: 2.1 10*3/uL (ref 0.7–4.0)
MCV: 88.9 fl (ref 78.0–100.0)
Monocytes Absolute: 0.5 10*3/uL (ref 0.1–1.0)
Monocytes Relative: 8.1 % (ref 3.0–12.0)
Neutro Abs: 3.6 10*3/uL (ref 1.4–7.7)
RDW: 14.2 % (ref 11.5–14.6)

## 2011-05-24 LAB — BASIC METABOLIC PANEL
Calcium: 9.8 mg/dL (ref 8.4–10.5)
GFR: 86.36 mL/min (ref >60.00)
Glucose, Bld: 89 mg/dL (ref 70–99)
Sodium: 138 mEq/L (ref 135–145)

## 2011-05-24 LAB — HEMOGLOBIN A1C: Hgb A1c MFr Bld: 6 % (ref 4.6–6.5)

## 2011-05-24 LAB — HEPATIC FUNCTION PANEL
Albumin: 4.5 g/dL (ref 3.5–5.2)
Alkaline Phosphatase: 73 U/L (ref 39–117)
Total Bilirubin: 0.4 mg/dL (ref 0.3–1.2)

## 2011-05-24 NOTE — Progress Notes (Signed)
Subjective:    Patient ID: Maria [name], female    DOB: Feb 16, 1941, 70 y.o.   MRN: [id number]  HPI Here for wellness and f/u;  Overall doing ok;  Pt denies CP, worsening SOB, DOE, wheezing, orthopnea, PND, worsening LE edema, palpitations, dizziness or syncope.  Pt denies neurological change such as new Headache, facial or extremity weakness.  Pt denies polydipsia, polyuria, or low sugar symptoms. Pt states overall good compliance with treatment and medications, good tolerability, and trying to follow lower cholesterol diet.  Pt denies worsening depressive symptoms, suicidal ideation or panic. No fever, wt loss, night sweats, loss of appetite, or other constitutional symptoms.  Pt states good ability with ADL's, low fall risk, home safety reviewed and adequate, no significant changes in hearing or vision, and occasionally active with exercise.  Gets sleepy after heavy meals, and concerned about low sugars, gets dry thick tongue sensation, had normal a1c in feb 2012 per Dr Mare Ferrari but still concerned about DM.  Also with 2 days BPV recurrence . Has bought a glucometer she bought on her own, seems low to her at 72 and 48 when feeling poorly.   Past Medical History  Diagnosis Date  . Carpal tunnel syndrome   . Anemia   . Arthritis   . Hypertension   . OSA (obstructive sleep apnea)   . Headache     chronic  . Diverticulosis   . GERD (gastroesophageal reflux disease)   . PNA (pneumonia)   . Palpitations   . Hyperlipidemia   . TIA (transient ischemic attack)   . Vertigo, benign paroxysmal   . Migraine    Past Surgical History  Procedure Date  . Carpal tunnel release     x 2  . Breast biopsy     rt benign cyst  . Replacement total knee bilateral   . Tonsillectomy   . Bladder repair     reports that she quit smoking about 41 years ago. Her smoking use included Cigarettes. She has a 10 pack-year smoking history. She does not have any smokeless tobacco history on file. She reports that  she drinks alcohol. She reports that she does not use illicit drugs. family history includes Aneurysm in her father; Colon cancer in her maternal grandmother; Colon polyps in her mother; Diabetes in her brother and father; Heart disease in her mother; Hypertension in her father; Irritable bowel syndrome in her daughter; and Kidney disease in her father. Allergies  Allergen Reactions  . Ace Inhibitors   . Bee     Bee stings-carry an EPIPEN  . Benicar (Olmesartan Medoxomil)   . Codeine     REACTION: nausea  . Lisinopril    Current Outpatient Prescriptions on File Prior to Visit  Medication Sig Dispense Refill  . amLODipine (NORVASC) 5 MG tablet TAKE 1 TABLET EVERY DAY  30 tablet  11  . b complex vitamins tablet Take 1 tablet by mouth daily.        Marland Kitchen CALCIUM PO Take by mouth daily.        . Cholecalciferol (VITAMIN D-3 PO) Take by mouth daily.        . fish oil-omega-3 fatty acids 1000 MG capsule Take by mouth daily.        . hydrochlorothiazide 25 MG tablet Once a day       . metoprolol tartrate (LOPRESSOR) 25 MG tablet Take 12.5 mg by mouth 2 (two) times daily.        Marland Kitchen omeprazole (PRILOSEC) 40  MG capsule 1 by mouth once a day 30-60 minutes before a meal       . potassium chloride (KLOR-CON) 20 MEQ packet Once a day       . simvastatin (ZOCOR) 20 MG tablet Take 20 mg by mouth at bedtime.        Marland Kitchen aspirin 81 MG tablet Take 81 mg by mouth daily.        Marland Kitchen PLAVIX 75 MG tablet TAKE 1 TABLET BY MOUTH EVERY DAY  30 tablet  11   Review of Systems Review of Systems  Constitutional: Negative for diaphoresis, activity change, appetite change and unexpected weight change.  HENT: Negative for hearing loss, ear pain, facial swelling, mouth sores and neck stiffness.   Eyes: Negative for pain, redness and visual disturbance.  Respiratory: Negative for shortness of breath and wheezing.   Cardiovascular: Negative for chest pain and palpitations.  Gastrointestinal: Negative for diarrhea, blood in  stool, abdominal distention and rectal pain.  Genitourinary: Negative for hematuria, flank pain and decreased urine volume.  Musculoskeletal: Negative for myalgias and joint swelling.  Skin: Negative for color change and wound.  Neurological: Negative for syncope and numbness.  Hematological: Negative for adenopathy.  Psychiatric/Behavioral: Negative for hallucinations, self-injury, decreased concentration and agitation.      Objective:   Physical Exam BP 102/62  Pulse 65  Temp(Src) 98.2 F (36.8 C) (Oral)  Ht _0  (1.626 m)  Wt 167 lb 8 oz (75.978 kg)  BMI 28.75 kg/m2  SpO2 97% Physical Exam  VS noted Constitutional: Pt is oriented to person, place, and time. Appears well-developed and well-nourished.  HENT:  Head: Normocephalic and atraumatic.  Right Ear: External ear normal.  Left Ear: External ear normal.  Nose: Nose normal.  Mouth/Throat: Oropharynx is clear and moist.  Eyes: Conjunctivae and EOM are normal. Pupils are equal, round, and reactive to light.  Neck: Normal range of motion. Neck supple. No JVD present. No tracheal deviation present.  Cardiovascular: Normal rate, regular rhythm, normal heart sounds and intact distal pulses.   Pulmonary/Chest: Effort normal and breath sounds normal.  Abdominal: Soft. Bowel sounds are normal. There is no tenderness.  Musculoskeletal: Normal range of motion. Exhibits no edema.  Lymphadenopathy:  Has no cervical adenopathy.  Neurological: Pt is alert and oriented to person, place, and time. Pt has normal reflexes. No cranial nerve deficit.  Skin: Skin is warm and dry. No rash noted.  Psychiatric:  Has  normal mood and affect. Behavior is normal.  1+ nervous       Assessment & Plan:

## 2011-05-24 NOTE — Patient Instructions (Addendum)
Please consider calling your [facility] Medicare to see if the shingles shot is covered, and make appt with nurse for the shot if it is Please go to LAB in the Basement for the blood and/or urine tests to be done today Please call the phone number [phone] (the [place]) for results of testing in 2-3 days;  When calling, simply dial the number, and when prompted enter the MRN number above (the Medical Record Number) and the # key, then the message should start. Continue all other medications as before Please return in 1 year for your yearly visit, or sooner if needed, with Lab testing done 3-5 days before

## 2011-05-28 ENCOUNTER — Encounter: Payer: Self-pay | Admitting: Internal Medicine

## 2011-05-28 NOTE — Assessment & Plan Note (Signed)

## 2011-05-28 NOTE — Assessment & Plan Note (Signed)
asympt - for f/u a1c

## 2011-05-31 ENCOUNTER — Telehealth: Payer: Self-pay | Admitting: Cardiology

## 2011-05-31 NOTE — Telephone Encounter (Signed)
Called in wondering if they could get the clearance for surgery from 05/11/11 refaxed to their office again. She says she is really sorry. It changed hands and never went to her. Please fax to [phone]. The patient has surgery in two days.

## 2011-06-01 ENCOUNTER — Ambulatory Visit (HOSPITAL_COMMUNITY)
Admission: RE | Admit: 2011-06-01 | Discharge: 2011-06-01 | Disposition: A | Discharge status: Home / Self Care | Payer: Medicare Other | Source: Ambulatory Visit | Attending: Cardiology | Admitting: Cardiology

## 2011-06-01 DIAGNOSIS — Z1231 Encounter for screening mammogram for malignant neoplasm of breast: Secondary | ICD-10-CM | POA: Insufficient documentation

## 2011-06-01 NOTE — Telephone Encounter (Signed)
Faxed ok to Dr Caesar Bookman office

## 2011-06-16 ENCOUNTER — Telehealth: Payer: Self-pay | Admitting: *Deleted

## 2011-06-16 NOTE — Telephone Encounter (Signed)
Rash under breast, bilaterally. Thought she had poison oak or something, but doesn't really itch.  Wanted to go get shingles vaccine.  Denied any pain or blisters, but suggested she go to urgent care for evaluation and that they may even be able to administer shingles vaccine if not shingles

## 2011-06-26 ENCOUNTER — Telehealth: Payer: Self-pay | Admitting: Cardiology

## 2011-06-26 DIAGNOSIS — R0989 Other specified symptoms and signs involving the circulatory and respiratory systems: Secondary | ICD-10-CM

## 2011-06-26 MED ORDER — AZITHROMYCIN 250 MG PO TABS: ORAL_TABLET | ORAL | Status: AC

## 2011-06-26 NOTE — Telephone Encounter (Signed)
?  RX something

## 2011-06-26 NOTE — Telephone Encounter (Signed)
We will treat her symptoms with Zithromax Z-Pak and she should take plain Mucinex twice a day

## 2011-06-26 NOTE — Telephone Encounter (Signed)
Patient has a bad chest cold.  [place] is in the [place]  [facility] [place]  She does not get good reception, but will check her messages later.

## 2011-08-27 ENCOUNTER — Other Ambulatory Visit: Payer: Self-pay | Admitting: Cardiology

## 2011-09-06 ENCOUNTER — Telehealth: Payer: Self-pay | Admitting: Cardiology

## 2011-09-06 NOTE — Telephone Encounter (Signed)
Spoke with patient and was confused over a call received from pharmacy

## 2011-09-06 NOTE — Telephone Encounter (Signed)
Left message

## 2011-09-06 NOTE — Telephone Encounter (Signed)
Patient wants to discuss the medications that she is supposed to be taking.

## 2011-09-07 LAB — BASIC METABOLIC PANEL
BUN: 4 — ABNORMAL LOW
BUN: 6
CO2: 31
Calcium: 8.6
Calcium: 8.7
Chloride: 101
Chloride: 103
Creatinine, Ser: 0.74
Creatinine, Ser: 0.74
GFR calc Af Amer: >60
GFR calc non Af Amer: >60
GFR calc non Af Amer: >60
GFR calc non Af Amer: >60
Glucose, Bld: 102 — ABNORMAL HIGH
Glucose, Bld: 108 — ABNORMAL HIGH
Glucose, Bld: 109 — ABNORMAL HIGH
Glucose, Bld: 89
Potassium: 3.6
Potassium: 3.7
Potassium: 3.7
Sodium: 137
Sodium: 138
Sodium: 140

## 2011-09-07 LAB — COMPREHENSIVE METABOLIC PANEL
Alkaline Phosphatase: 67
BUN: 11
Chloride: 101
GFR calc non Af Amer: >60
Glucose, Bld: 95
Potassium: 3.8
Total Bilirubin: 0.8
Total Protein: 7.1

## 2011-09-07 LAB — URINALYSIS, ROUTINE W REFLEX MICROSCOPIC
Bilirubin Urine: NEGATIVE
Hgb urine dipstick: NEGATIVE
Ketones, ur: NEGATIVE
Nitrite: NEGATIVE
Urobilinogen, UA: 0.2
pH: 7

## 2011-09-07 LAB — URINE CULTURE: Colony Count: NO GROWTH

## 2011-09-07 LAB — DIFFERENTIAL
Basophils Absolute: 0
Basophils Relative: 0
Neutro Abs: 4.4
Neutrophils Relative %: 58

## 2011-09-07 LAB — CROSSMATCH
ABO/RH(D): A POS
Antibody Screen: NEGATIVE

## 2011-09-07 LAB — CBC
HCT: 28.5 — ABNORMAL LOW
HCT: 31.1 — ABNORMAL LOW
HCT: 31.8 — ABNORMAL LOW
HCT: 42.3
Hemoglobin: 10.5 — ABNORMAL LOW
Hemoglobin: 10.9 — ABNORMAL LOW
Hemoglobin: 14.5
Hemoglobin: 9.7 — ABNORMAL LOW
MCHC: 34.3
MCHC: 34.3
MCV: 88.4
MCV: 88.6
MCV: 88.7
Platelets: 236
Platelets: 247
Platelets: 258
Platelets: 281
RBC: 3.2 — ABNORMAL LOW
RBC: 4.78
RDW: 13.6
RDW: 13.6
RDW: 13.7
RDW: 13.8
WBC: 10.3
WBC: 7.6
WBC: 9.9

## 2011-09-07 LAB — PROTIME-INR
INR: 0.9
Prothrombin Time: 12.2

## 2011-09-07 LAB — APTT: aPTT: 29

## 2011-09-13 ENCOUNTER — Other Ambulatory Visit: Payer: Self-pay | Admitting: Cardiology

## 2011-09-13 DIAGNOSIS — E876 Hypokalemia: Secondary | ICD-10-CM

## 2011-09-15 LAB — COMPREHENSIVE METABOLIC PANEL
ALT: 19
AST: 23
Albumin: 3.9
Alkaline Phosphatase: 58
Chloride: 103
Potassium: 3.5
Sodium: 137
Total Bilirubin: 0.9
Total Protein: 6.5

## 2011-09-15 LAB — DIFFERENTIAL
Basophils Absolute: 0
Basophils Relative: 0
Eosinophils Absolute: 0.1
Eosinophils Relative: 2
Monocytes Absolute: 0.7
Monocytes Relative: 11

## 2011-09-15 LAB — URINALYSIS, ROUTINE W REFLEX MICROSCOPIC
Bilirubin Urine: NEGATIVE
Glucose, UA: NEGATIVE
Hgb urine dipstick: NEGATIVE
Nitrite: NEGATIVE
Specific Gravity, Urine: 1.02
pH: 6

## 2011-09-15 LAB — APTT: aPTT: 32

## 2011-09-15 LAB — BASIC METABOLIC PANEL
BUN: 2 — ABNORMAL LOW
BUN: 3 — ABNORMAL LOW
BUN: 5 — ABNORMAL LOW
CO2: 31
Calcium: 8.4
Calcium: 8.5
GFR calc Af Amer: >60
GFR calc non Af Amer: >60
GFR calc non Af Amer: >60
Glucose, Bld: 101 — ABNORMAL HIGH
Glucose, Bld: 105 — ABNORMAL HIGH
Potassium: 3.7
Sodium: 132 — ABNORMAL LOW
Sodium: 133 — ABNORMAL LOW

## 2011-09-15 LAB — TYPE AND SCREEN: Antibody Screen: NEGATIVE

## 2011-09-15 LAB — CBC
HCT: 30.3 — ABNORMAL LOW
HCT: 32.6 — ABNORMAL LOW
HCT: 37
Hemoglobin: 10.1 — ABNORMAL LOW
Hemoglobin: 10.7 — ABNORMAL LOW
MCHC: 33
MCHC: 33.4
MCV: 91.3
Platelets: 220
Platelets: 268
RBC: 3.57 — ABNORMAL LOW
RDW: 13.5
RDW: 13.7
RDW: 14
WBC: 6.9

## 2011-12-20 ENCOUNTER — Other Ambulatory Visit: Payer: Self-pay | Admitting: Cardiology

## 2011-12-20 MED ORDER — HYDROCHLOROTHIAZIDE 25 MG PO TABS: 25.0000 mg | ORAL_TABLET | Freq: Every day | ORAL | Status: DC

## 2012-01-17 ENCOUNTER — Ambulatory Visit (INDEPENDENT_AMBULATORY_CARE_PROVIDER_SITE_OTHER): Payer: Medicare Other | Admitting: Internal Medicine

## 2012-01-17 ENCOUNTER — Encounter: Payer: Self-pay | Admitting: Internal Medicine

## 2012-01-17 ENCOUNTER — Telehealth: Payer: Self-pay | Admitting: Cardiology

## 2012-01-17 VITALS — BP 120/72 | HR 99 | Temp 98.3°F

## 2012-01-17 DIAGNOSIS — J209 Acute bronchitis, unspecified: Secondary | ICD-10-CM

## 2012-01-17 MED ORDER — AZITHROMYCIN 250 MG PO TABS: ORAL_TABLET | ORAL | Status: AC

## 2012-01-17 MED ORDER — HYDROCODONE-HOMATROPINE 5-1.5 MG/5ML PO SYRP: 5.0000 mL | ORAL_SOLUTION | Freq: Four times a day (QID) | ORAL | Status: AC | PRN

## 2012-01-17 NOTE — Telephone Encounter (Signed)
Spoke with patient and she stated she did NOT think this was heart related and secondary to cold, congestion she has been having.  Advised she needed to call her PCP, verbalized understanding.  Did give her the number.

## 2012-01-17 NOTE — Telephone Encounter (Signed)
Pt calling re difficulty breathing, and chest pain, has a chest cold and not sure if coming from that or not, so requesting a call

## 2012-01-17 NOTE — Patient Instructions (Signed)
It was good to see you today. Zpak antibiotics and Hydromet cough syrup - Your prescription(s) have been submitted to your pharmacy. Please take as directed and contact our office if you believe you are having problem(s) with the medication(s). Ok to [facility] and Vit C, Alternate between ibuprofen and tylenol for aches, pain and fever symptoms as discussed Hydrate, rest and call us if symptoms worse or unimproved in next 10-14 days

## 2012-01-17 NOTE — Progress Notes (Signed)
  Subjective:    HPI  complains of cold symptoms  Onset >1 week ago, wax/wane symptoms  Initially associated with rhinorrhea, sneezing, sore throat, mild headache and low grade fever Also myalgias, sinus pressure and mild-mod chest congestion > productive green sputum, worse at night No relief with OTC meds Precipitated by sick contacts  Past Medical History  Diagnosis Date  . Carpal tunnel syndrome   . Anemia   . Arthritis   . Hypertension   . OSA (obstructive sleep apnea)   . Headache     chronic  . Diverticulosis   . GERD (gastroesophageal reflux disease)   . Palpitations   . Hyperlipidemia   . TIA (transient ischemic attack)   . Vertigo, benign paroxysmal   . Migraine     Review of Systems Constitutional: No fever or night sweats, no unexpected weight change Pulmonary: No pleurisy or hemoptysis Cardiovascular: No chest pain or palpitations     Objective:   Physical Exam BP 120/72  Pulse 99  Temp(Src) 98.3 F (36.8 C) (Oral)  SpO2 95% GEN: mildly ill appearing and audible chest congestion HENT: NCAT, no sinus tenderness bilaterally, nares without discharge, oropharynx mild erythema, no exudate Eyes: Vision grossly intact, no conjunctivitis Lungs: B scattered rhonchi, no wheeze, no increased work of breathing Cardiovascular: Regular rate and rhythm, no bilateral edema      Assessment & Plan:  Viral URI x 3 weeks>> now acute bronchitis symptoms x 1 week Cough,  related to above    Empiric antibiotics prescribed due to symptom duration greater than 7 days Prescription cough suppression - new prescriptions done Symptomatic care with Tylenol or Advil, hydration and rest -  salt gargle advised as needed

## 2012-03-25 ENCOUNTER — Telehealth: Payer: Self-pay | Admitting: Cardiology

## 2012-03-25 NOTE — Telephone Encounter (Signed)
Left message

## 2012-03-25 NOTE — Telephone Encounter (Signed)
Patient called back and advised  Dr. Mare Ferrari out of the office this week.  Advised that this Rx needs to come from a dentist.  [place] with [place] there.  Advised unable to call anything in for her, could try calling her PCP

## 2012-03-25 NOTE — Telephone Encounter (Signed)
New msg Pt has an abscessed tooth and she in process of moving.  Pt wants to know should she take antibiotic and stop blood thinner before any dental work. If so, Please call to [employer] [facility]

## 2012-03-26 ENCOUNTER — Telehealth: Payer: Self-pay

## 2012-03-26 MED ORDER — AMOXICILLIN 500 MG PO CAPS: ORAL_CAPSULE | ORAL | Status: DC

## 2012-03-26 NOTE — Telephone Encounter (Signed)
error 

## 2012-03-26 NOTE — Telephone Encounter (Signed)
Done per emr 

## 2012-03-26 NOTE — Telephone Encounter (Signed)
Called left message to call back 

## 2012-03-26 NOTE — Telephone Encounter (Signed)
The patient stated she is out of town and with a new Pharmacist, community and the dentist is requesting an antibiotic.

## 2012-03-26 NOTE — Telephone Encounter (Signed)
The patient is out of town Therapist, [facility] [place]) and is having dental work tomorrow and they are requesting she be put on antibiotic. Also the patient is on plavix, is ok to stay on or stop for this procedure. [facility] [address].

## 2012-03-26 NOTE — Telephone Encounter (Signed)
Ok to hold the plavix  No need for antibx as this is not normally done anymore, unless she was specifically told this by her orthopedic surgeon with respect to her knee replacements (but these days even this is unusual)

## 2012-03-27 NOTE — Telephone Encounter (Signed)
Patient informed prescription has been sent in to pharmacy

## 2012-04-02 ENCOUNTER — Other Ambulatory Visit: Payer: Self-pay | Admitting: Cardiology

## 2012-05-09 ENCOUNTER — Other Ambulatory Visit: Payer: Self-pay | Admitting: Cardiology

## 2012-05-10 NOTE — Telephone Encounter (Signed)
Refilled plavix

## 2012-05-18 ENCOUNTER — Other Ambulatory Visit: Payer: Self-pay | Admitting: Cardiology

## 2012-06-12 ENCOUNTER — Other Ambulatory Visit: Payer: Self-pay | Admitting: *Deleted

## 2012-06-12 ENCOUNTER — Other Ambulatory Visit: Payer: Self-pay | Admitting: Cardiology

## 2012-06-12 MED ORDER — HYDROCHLOROTHIAZIDE 25 MG PO TABS: 25.0000 mg | ORAL_TABLET | Freq: Every day | ORAL | Status: DC

## 2012-06-12 MED ORDER — CLOPIDOGREL BISULFATE 75 MG PO TABS: 75.0000 mg | ORAL_TABLET | Freq: Every day | ORAL | Status: DC

## 2012-06-13 ENCOUNTER — Other Ambulatory Visit: Payer: Self-pay | Admitting: Cardiology

## 2012-06-13 NOTE — Telephone Encounter (Signed)
Refilled hctz

## 2012-07-05 ENCOUNTER — Telehealth: Payer: Self-pay

## 2012-07-05 DIAGNOSIS — Z0000 Encounter for general adult medical examination without abnormal findings: Secondary | ICD-10-CM

## 2012-07-05 NOTE — Telephone Encounter (Signed)
Put order in for physical labs.

## 2012-08-27 ENCOUNTER — Encounter: Payer: Self-pay | Admitting: Cardiology

## 2012-09-13 ENCOUNTER — Other Ambulatory Visit: Payer: Self-pay | Admitting: Cardiology

## 2012-09-25 ENCOUNTER — Telehealth: Payer: Self-pay | Admitting: Internal Medicine

## 2012-09-25 NOTE — Telephone Encounter (Signed)
Caller: [place]/Patient; Patient Name: Maria [name]; PCP: Cathlean Cower (Adults only); Best Callback Phone Number: [phone]; Reason for call: Cough/Congestion. Onset this weekend 09/21/12.  Febrile at times per patient but did not take temperature. +coughing spasms, productive clear/cloudy . Oringinally started in chest now cold s/sx in head. Treating OTC with Muicinex. She is not at home and is in the [place]. She would like a Zpack.  She states coughing kept her up last night, actively coughing during triage . Emergent s/sx ruled out per Cough Adult Protocol with the exception to "New or worsening cough and known Cardiac or respiratory condition. Per office Policy NO ORAL ANTIBOTICS WILL BE CALLED IN. See provider in 4 hours. Advised patinent to be seen at ER or [facility] near her. Care advice and Guidelines reviewed. Understanding expressed. Encouraged to call back for questions, changes or concerns.

## 2012-10-22 ENCOUNTER — Ambulatory Visit (INDEPENDENT_AMBULATORY_CARE_PROVIDER_SITE_OTHER): Payer: Medicare Other | Admitting: Internal Medicine

## 2012-10-22 ENCOUNTER — Encounter: Payer: Self-pay | Admitting: Internal Medicine

## 2012-10-22 ENCOUNTER — Other Ambulatory Visit (INDEPENDENT_AMBULATORY_CARE_PROVIDER_SITE_OTHER): Payer: Medicare Other

## 2012-10-22 VITALS — BP 120/70 | HR 85 | Temp 98.4°F | Ht 64.0 in | Wt 171.5 lb

## 2012-10-22 DIAGNOSIS — J209 Acute bronchitis, unspecified: Secondary | ICD-10-CM

## 2012-10-22 DIAGNOSIS — Z79899 Other long term (current) drug therapy: Secondary | ICD-10-CM

## 2012-10-22 DIAGNOSIS — Z136 Encounter for screening for cardiovascular disorders: Secondary | ICD-10-CM

## 2012-10-22 DIAGNOSIS — R7309 Other abnormal glucose: Secondary | ICD-10-CM

## 2012-10-22 DIAGNOSIS — Z23 Encounter for immunization: Secondary | ICD-10-CM

## 2012-10-22 DIAGNOSIS — R7302 Impaired glucose tolerance (oral): Secondary | ICD-10-CM

## 2012-10-22 DIAGNOSIS — Z0000 Encounter for general adult medical examination without abnormal findings: Secondary | ICD-10-CM

## 2012-10-22 LAB — TSH: TSH: 2.29 u[IU]/mL (ref 0.35–5.50)

## 2012-10-22 LAB — BASIC METABOLIC PANEL
BUN: 8 mg/dL (ref 6–23)
CO2: 28 mEq/L (ref 19–32)
Chloride: 106 mEq/L (ref 96–112)
Potassium: 4.2 mEq/L (ref 3.5–5.1)

## 2012-10-22 LAB — CBC WITH DIFFERENTIAL/PLATELET
Eosinophils Absolute: 0.2 10*3/uL (ref 0.0–0.7)
Lymphocytes Relative: 26.1 % (ref 12.0–46.0)
MCHC: 33.1 g/dL (ref 30.0–36.0)
MCV: 90.3 fl (ref 78.0–100.0)
Monocytes Absolute: 0.7 10*3/uL (ref 0.1–1.0)
Neutrophils Relative %: 60.1 % (ref 43.0–77.0)
Platelets: 293 10*3/uL (ref 150.0–400.0)
WBC: 6.5 10*3/uL (ref 4.5–10.5)

## 2012-10-22 LAB — LIPID PANEL
Cholesterol: 126 mg/dL (ref 0–200)
LDL Cholesterol: 59 mg/dL (ref 0–99)
Triglycerides: 42 mg/dL (ref 0.0–149.0)

## 2012-10-22 LAB — HEPATIC FUNCTION PANEL
ALT: 27 U/L (ref 0–35)
AST: 29 U/L (ref 0–37)
Bilirubin, Direct: 0.2 mg/dL (ref 0.0–0.3)
Total Bilirubin: 1 mg/dL (ref 0.3–1.2)
Total Protein: 7.3 g/dL (ref 6.0–8.3)

## 2012-10-22 LAB — URINALYSIS, ROUTINE W REFLEX MICROSCOPIC
Bilirubin Urine: NEGATIVE
Hgb urine dipstick: NEGATIVE
Leukocytes, UA: NEGATIVE
Nitrite: NEGATIVE
Urobilinogen, UA: 0.2 (ref 0.0–1.0)

## 2012-10-22 LAB — HEMOGLOBIN A1C: Hgb A1c MFr Bld: 5.8 % (ref 4.6–6.5)

## 2012-10-22 MED ORDER — AZITHROMYCIN 250 MG PO TABS: ORAL_TABLET | ORAL | Status: DC

## 2012-10-22 MED ORDER — HYDROCODONE-HOMATROPINE 5-1.5 MG/5ML PO SYRP: 5.0000 mL | ORAL_SOLUTION | Freq: Four times a day (QID) | ORAL | Status: DC | PRN

## 2012-10-22 NOTE — Progress Notes (Signed)
Subjective:    Patient ID: Maria [name], female    DOB: [birth year], 71 y.o.   MRN: [id number]  HPI  Here for wellness and f/u;  Overall doing ok;  Pt denies CP, worsening SOB, DOE, wheezing, orthopnea, PND, worsening LE edema, palpitations, dizziness or syncope.  Pt denies neurological change such as new Headache, facial or extremity weakness.  Pt denies polydipsia, polyuria, or low sugar symptoms. Pt states overall good compliance with treatment and medications, good tolerability, and trying to follow lower cholesterol diet.  Pt denies worsening depressive symptoms, suicidal ideation or panic. No fever, wt loss, night sweats, loss of appetite, or other constitutional symptoms.  Pt states good ability with ADL's, low fall risk, home safety reviewed and adequate, no significant changes in hearing or vision, and occasionally active with exercise.  Has increased forgetfulness, but also has been having a kind what sounds like mild expressive aphasia with being unable to come up with words for what she wants to say, new onset since TIA last yr, MRI neg for stroke at the time. ALso with with vertigo occasinally c/w BPV (positional) since that time.  Tx for infection/bronchitis  with augmentin per urgent care 2 wks, ago, still with some change in bowels/diarrhea.  Has eaten more sweets in the psat yr and gained several lbs.  Wants to do labs.  Still coughing related to recent infection.  Moved yesterday out of storage into a condo so tired.  Denies worsening depressive symptoms, suicidal ideation, or panic.  Just overwhelmed with daily life and recent move (mild).  Does get noticebly irritable with some friends and others today.   Past Medical History  Diagnosis Date  . Carpal tunnel syndrome   . Anemia   . Arthritis   . Hypertension   . OSA (obstructive sleep apnea)   . Headache     chronic  . Diverticulosis   . GERD (gastroesophageal reflux disease)   . Palpitations   . Hyperlipidemia   . TIA  (transient ischemic attack)   . Vertigo, benign paroxysmal   . Migraine    Past Surgical History  Procedure Date  . Carpal tunnel release     x 2  . Breast biopsy     rt benign cyst  . Replacement total knee bilateral   . Tonsillectomy   . Bladder repair     reports that she quit smoking about 42 years ago. Her smoking use included Cigarettes. She has a 10 pack-year smoking history. She does not have any smokeless tobacco history on file. She reports that she drinks alcohol. She reports that she does not use illicit drugs. family history includes Aneurysm in her father; Colon cancer in her maternal grandmother; Colon polyps in her mother; Diabetes in her brother and father; Heart disease in her mother; Hypertension in her father; Irritable bowel syndrome in her daughter; and Kidney disease in her father. Allergies  Allergen Reactions  . Ace Inhibitors   . Benicar (Olmesartan Medoxomil)   . Codeine     REACTION: nausea  . Lisinopril   . Nutritional Supplements     Bee stings-carry an EPIPEN   Current Outpatient Prescriptions on File Prior to Visit  Medication Sig Dispense Refill  . amLODipine (NORVASC) 5 MG tablet TAKE 1 TABLET EVERY DAY  30 tablet  3  . aspirin 81 MG tablet Take 81 mg by mouth daily.        Marland Kitchen b complex vitamins tablet Take 1 tablet by mouth daily.        Marland Kitchen  CALCIUM PO Take by mouth daily.        . Cholecalciferol (VITAMIN D-3 PO) Take by mouth daily.        . clopidogrel (PLAVIX) 75 MG tablet TAKE 1 TABLET (75 MG TOTAL) BY MOUTH DAILY.  30 tablet  3  . fish oil-omega-3 fatty acids 1000 MG capsule Take by mouth daily.        . hydrochlorothiazide (HYDRODIURIL) 25 MG tablet TAKE 1 TABLET (25 MG TOTAL) BY MOUTH DAILY. ONCE A DAY  30 tablet  3  . KLOR-CON M20 20 MEQ tablet TAKE 1 TABLET EVERY DAY  30 tablet  3  . metoprolol tartrate (LOPRESSOR) 25 MG tablet TAKE 1/2 TABLET BY MOUTH TWICE A DAY  30 tablet  3  . omeprazole (PRILOSEC) 40 MG capsule 1 by mouth once a day  30-60 minutes before a meal       . simvastatin (ZOCOR) 20 MG tablet TAKE 1 TABLET BY MOUTH EVERY DAY  30 tablet  3  . amoxicillin (AMOXIL) 500 MG capsule 2 tabs by mouth 1 hour prior to procedure, then 2 tabs by mouth 6 hours after  4 capsule  0   Review of Systems Review of Systems  Constitutional: Negative for diaphoresis, activity change, appetite change and unexpected weight change.  HENT: Negative for hearing loss, ear pain, facial swelling, mouth sores and neck stiffness.   Eyes: Negative for pain, redness and visual disturbance.  Respiratory: Negative for shortness of breath and wheezing.   Cardiovascular: Negative for chest pain and palpitations.  Gastrointestinal: Negative for diarrhea, blood in stool, abdominal distention and rectal pain.  Genitourinary: Negative for hematuria, flank pain and decreased urine volume.  Musculoskeletal: Negative for myalgias and joint swelling.  Skin: Negative for color change and wound.  Neurological: Negative for syncope and numbness.  Hematological: Negative for adenopathy.  Psychiatric/Behavioral: Negative for hallucinations, self-injury, decreased concentration and agitation.      Objective:   Physical Exam BP 120/70  Pulse 85  Temp 98.4 F (36.9 C) (Oral)  Ht 5' 4" (1.626 m)  Wt 171 lb 8 oz (77.792 kg)  BMI 29.44 kg/m2  SpO2 96% Physical Exam  VS noted, mild ill Constitutional: Pt is oriented to person, place, and time. Appears well-developed and well-nourished.  HENT:  Head: Normocephalic and atraumatic.  Right Ear: External ear normal.  Left Ear: External ear normal.  Nose: Nose normal.  Bilat tm's mild erythema.  Sinus nontender.  Pharynx mild erythema Mouth/Throat: Oropharynx is clear and moist.  Eyes: Conjunctivae and EOM are normal. Pupils are equal, round, and reactive to light.  Neck: Normal range of motion. Neck supple. No JVD present. No tracheal deviation present.  Cardiovascular: Normal rate, regular rhythm, normal  heart sounds and intact distal pulses.   Pulmonary/Chest: Effort normal and breath sounds normal.  Abdominal: Soft. Bowel sounds are normal. There is no tenderness.  Musculoskeletal: Normal range of motion. Exhibits no edema.  Lymphadenopathy:  Has no cervical adenopathy.  Neurological: Pt is alert and oriented to person, place, and time. Pt has normal reflexes. No cranial nerve deficit.  Skin: Skin is warm and dry. No rash noted.  Psychiatric:  Has  normal mood and affect. Behavior is normal. Except slight irritable    Assessment & Plan:

## 2012-10-22 NOTE — Assessment & Plan Note (Signed)
Mild to mod, for antibx course,  to f/u any worsening symptoms or concerns 

## 2012-10-22 NOTE — Assessment & Plan Note (Signed)
stable overall by hx and exam, most recent data reviewed with pt, and pt to continue medical treatment as before Lab Results  Component Value Date   HGBA1C 6.0 05/24/2011

## 2012-10-22 NOTE — Assessment & Plan Note (Signed)
Overall doing well, age appropriate education and counseling updated, referrals for preventative services and immunizations addressed, dietary and smoking counseling addressed, most recent labs and ECG reviewed.  I have personally reviewed and have noted: 1) the patient's medical and social history 2) The pt's use of alcohol, tobacco, and illicit drugs 3) The patient's current medications and supplements 4) Functional ability including ADL's, fall risk, home safety risk, hearing and visual impairment 5) Diet and physical activities 6) Evidence for depression or mood disorder 7) The patient's height, weight, and BMI have been recorded in the chart I have made referrals, and provided counseling and education based on review of the above ECG reviewed as per emr

## 2012-10-22 NOTE — Patient Instructions (Addendum)
You had the flu shot today Take all new medications as prescribed Continue all other medications as before You are given the prescription for [facility] for the shingles shot (or you can price it at other pharmacies too) Please have the pharmacy call with any refills you may need. Please continue your efforts at being more active, low cholesterol diet, and weight control. Please go to LAB in the Basement for the blood and/or urine tests to be done today You will be contacted by phone if any changes need to be made immediately.  Otherwise, you will receive a letter about your results with an explanation. Please remember to sign up for My Chart at your earliest convenience, as this will be important to you in the future with finding out test results. Please return in 1 year for your yearly visit, or sooner if needed, with Lab testing done 3-5 days before

## 2012-10-30 ENCOUNTER — Telehealth: Payer: Self-pay

## 2012-10-30 NOTE — Telephone Encounter (Signed)
Pt called stating that Rx given at last OV for Zostavax was misplaced. Pt is requesting Rx to be sent to [facility], please advise

## 2012-10-30 NOTE — Telephone Encounter (Signed)
Done hardcopy to robin  

## 2012-10-31 NOTE — Telephone Encounter (Signed)
I checked EPIC for recent MRI and there is none  Perhaps she has this done by another MD , and maybe should contact that MD with her question?

## 2012-10-31 NOTE — Telephone Encounter (Signed)
The patient was referring to the MRI in her chart under imaging dated 02/01/11.

## 2012-10-31 NOTE — Telephone Encounter (Signed)
Feb 2012 MRI actually was good for her age as far as "shrinkage" of the brain that is related to age, and no other significant abnormality  This would not completely rule early memory problems, but forgetfullness can be a common sign of aging if it does not get any worse than that, and can also be related to some medications, other illness happening at the time, and even anxiety  I think Ok to watch for now to see if any worsening in her case

## 2012-10-31 NOTE — Telephone Encounter (Signed)
Called the patient back and there was an MRI at the hospital referred to at her 10/22/12 OV

## 2012-10-31 NOTE — Telephone Encounter (Signed)
Robin to get result please

## 2012-10-31 NOTE — Telephone Encounter (Signed)
Faxed rx to pharmacy and patient to pickup hardcopy as well.  The patient discussed at last OV her becoming more forgetful.  She would like to know if the MRI recently showed anything indicating a problem and is there further testing that can be done?

## 2012-11-01 NOTE — Telephone Encounter (Signed)
Ok with me, or I can refer to neurology

## 2012-11-01 NOTE — Telephone Encounter (Signed)
Called the patient informed of MD instructions.  She would like to schedule an OV to further evaluate as states as symptoms have worsened.

## 2012-11-11 ENCOUNTER — Ambulatory Visit (INDEPENDENT_AMBULATORY_CARE_PROVIDER_SITE_OTHER): Payer: Medicare Other | Admitting: Internal Medicine

## 2012-11-11 ENCOUNTER — Encounter: Payer: Self-pay | Admitting: Internal Medicine

## 2012-11-11 ENCOUNTER — Ambulatory Visit (INDEPENDENT_AMBULATORY_CARE_PROVIDER_SITE_OTHER)
Admission: RE | Admit: 2012-11-11 | Discharge: 2012-11-11 | Disposition: A | Discharge status: Home / Self Care | Payer: Medicare Other | Source: Ambulatory Visit | Attending: Internal Medicine | Admitting: Internal Medicine

## 2012-11-11 VITALS — BP 118/70 | HR 60 | Temp 97.8°F | Ht 64.0 in | Wt 174.4 lb

## 2012-11-11 DIAGNOSIS — R1310 Dysphagia, unspecified: Secondary | ICD-10-CM

## 2012-11-11 DIAGNOSIS — R7302 Impaired glucose tolerance (oral): Secondary | ICD-10-CM

## 2012-11-11 DIAGNOSIS — R05 Cough: Secondary | ICD-10-CM

## 2012-11-11 DIAGNOSIS — R059 Cough, unspecified: Secondary | ICD-10-CM

## 2012-11-11 DIAGNOSIS — R7309 Other abnormal glucose: Secondary | ICD-10-CM

## 2012-11-11 DIAGNOSIS — G459 Transient cerebral ischemic attack, unspecified: Secondary | ICD-10-CM

## 2012-11-11 MED ORDER — SIMVASTATIN 10 MG PO TABS: 10.0000 mg | ORAL_TABLET | Freq: Every day | ORAL | Status: DC

## 2012-11-11 MED ORDER — OMEPRAZOLE 40 MG PO CPDR: DELAYED_RELEASE_CAPSULE | ORAL | Status: DC

## 2012-11-11 MED ORDER — MONTELUKAST SODIUM 10 MG PO TABS: 10.0000 mg | ORAL_TABLET | Freq: Every day | ORAL | Status: DC

## 2012-11-11 MED ORDER — ALBUTEROL SULFATE HFA 108 (90 BASE) MCG/ACT IN AERS: 2.0000 | INHALATION_SPRAY | Freq: Four times a day (QID) | RESPIRATORY_TRACT | Status: DC | PRN

## 2012-11-11 NOTE — Patient Instructions (Addendum)
OK to decrease the simvastatin to 10 mg per day OK to increase the prilosec to 40 mg twice per day for now Take all new medications as prescribed - the singulair at 10 mg per day (helps with asthma and allergies), and the albuterol Inhaler as needed for cough/wheezing/sob Continue all other medications as before Please go to XRAY in the Basement for the x-ray test You will be contacted by phone if any changes need to be made immediately.  Otherwise, you will receive a letter about your results with an explanation, but please check with MyChart first. Please remember to sign up for My Chart at your earliest convenience, as this will be important to you in the future with finding out test results. We could consider referral to pulmonary for ongoing cough if it persists You will be contacted regarding the referral for: GI - Dr Carlean Purl

## 2012-11-11 NOTE — Assessment & Plan Note (Signed)
Unclear etiology, for trial singulair 10 and proventil mdi prn, also for increaed PPI, adn cxr,  to f/u any worsening symptoms or concerns

## 2012-11-11 NOTE — Assessment & Plan Note (Signed)
stable overall by hx and exam, , and pt to continue medical treatment as before - the plavix

## 2012-11-11 NOTE — Assessment & Plan Note (Signed)
stable overall by hx and exam, most recent data reviewed with pt, and pt to continue medical treatment as before Lab Results  Component Value Date   HGBA1C 5.8 10/22/2012

## 2012-11-11 NOTE — Assessment & Plan Note (Signed)
Ok to increase the prilosec to 40 bid, but suspect will need repeat dilation - to refer back to Dr Delrae Rend

## 2012-11-11 NOTE — Progress Notes (Signed)
Subjective:    Patient ID: Maria [name], female    DOB: May 21, 1941, 71 y.o.   MRN: [id number]  HPI  Here to f/u, concerned about her memory, has been forgetful, and some family has mentioned as well.  Also with recurring dysphagia recent in the past few months, without n/v, or overt worsening reflux and Denies abd pain, n/v, bowel change or blood.  Pt denies chest pain, increased sob or doe, wheezing, orthopnea, PND, increased LE swelling, palpitations, dizziness or syncope.   Pt denies polydipsia, polyuria.  Pt denies new neurological symptoms such as new headache, or facial or extremity weakness or numbness  Still has occasional nonprod cough as well with ? Wheezing and mild nasal allergies, recently not improved with antibx. Past Medical History  Diagnosis Date  . Carpal tunnel syndrome   . Anemia   . Arthritis   . Hypertension   . OSA (obstructive sleep apnea)   . Headache     chronic  . Diverticulosis   . GERD (gastroesophageal reflux disease)   . Palpitations   . Hyperlipidemia   . TIA (transient ischemic attack)   . Vertigo, benign paroxysmal   . Migraine    Past Surgical History  Procedure Date  . Carpal tunnel release     x 2  . Breast biopsy     rt benign cyst  . Replacement total knee bilateral   . Tonsillectomy   . Bladder repair     reports that she quit smoking about 42 years ago. Her smoking use included Cigarettes. She has a 10 pack-year smoking history. She does not have any smokeless tobacco history on file. She reports that she drinks alcohol. She reports that she does not use illicit drugs. family history includes Aneurysm in her father; Colon cancer in her maternal grandmother; Colon polyps in her mother; Diabetes in her brother and father; Heart disease in her mother; Hypertension in her father; Irritable bowel syndrome in her daughter; and Kidney disease in her father. Allergies  Allergen Reactions  . Ace Inhibitors   . Benicar (Olmesartan Medoxomil)     . Codeine     REACTION: nausea  . Lisinopril   . Nutritional Supplements     Bee stings-carry an EPIPEN   Current Outpatient Prescriptions on File Prior to Visit  Medication Sig Dispense Refill  . amLODipine (NORVASC) 5 MG tablet TAKE 1 TABLET EVERY DAY  30 tablet  3  . aspirin 81 MG tablet Take 81 mg by mouth daily.        Marland Kitchen b complex vitamins tablet Take 1 tablet by mouth daily.        Marland Kitchen CALCIUM PO Take by mouth daily.        . Cholecalciferol (VITAMIN D-3 PO) Take by mouth daily.        . clopidogrel (PLAVIX) 75 MG tablet TAKE 1 TABLET (75 MG TOTAL) BY MOUTH DAILY.  30 tablet  3  . fish oil-omega-3 fatty acids 1000 MG capsule Take by mouth daily.        . hydrochlorothiazide (HYDRODIURIL) 25 MG tablet TAKE 1 TABLET (25 MG TOTAL) BY MOUTH DAILY. ONCE A DAY  30 tablet  3  . KLOR-CON M20 20 MEQ tablet TAKE 1 TABLET EVERY DAY  30 tablet  3  . metoprolol tartrate (LOPRESSOR) 25 MG tablet TAKE 1/2 TABLET BY MOUTH TWICE A DAY  30 tablet  3  . [DISCONTINUED] omeprazole (PRILOSEC) 40 MG capsule 1 by mouth once a day  30-60 minutes before a meal       . [DISCONTINUED] simvastatin (ZOCOR) 20 MG tablet TAKE 1 TABLET BY MOUTH EVERY DAY  30 tablet  3  . albuterol (PROVENTIL HFA;VENTOLIN HFA) 108 (90 BASE) MCG/ACT inhaler Inhale 2 puffs into the lungs every 6 (six) hours as needed for wheezing or shortness of breath.  1 Inhaler  5  . HYDROcodone-homatropine (HYCODAN) 5-1.5 MG/5ML syrup Take 5 mLs by mouth every 6 (six) hours as needed for cough.  120 mL  1  . montelukast (SINGULAIR) 10 MG tablet Take 1 tablet (10 mg total) by mouth at bedtime.  90 tablet  3   Review of Systems  Constitutional: Negative for diaphoresis and unexpected weight change.  HENT: Negative for tinnitus.   Eyes: Negative for photophobia and visual disturbance.  Respiratory: Negative for choking and stridor.   Gastrointestinal: Negative for vomiting and blood in stool.  Genitourinary: Negative for hematuria and decreased  urine volume.  Musculoskeletal: Negative for gait problem.  Skin: Negative for color change and wound.  Neurological: Negative for tremors and numbness.  Psychiatric/Behavioral: Negative for decreased concentration. The patient is not hyperactive.       Objective:   Physical Exam BP 118/70  Pulse 60  Temp 97.8 F (36.6 C) (Oral)  Ht _0  (1.626 m)  Wt 174 lb 6 oz (79.096 kg)  BMI 29.93 kg/m2  SpO2 97% Physical Exam  VS noted Constitutional: Pt appears well-developed and well-nourished.  HENT: Head: Normocephalic.  Right Ear: External ear normal.  Left Ear: External ear normal.  Bilat tm's mild erythema.  Sinus nontender.  Pharynx mild erythema Eyes: Conjunctivae and EOM are normal. Pupils are equal, round, and reactive to light.  Neck: Normal range of motion. Neck supple.  Cardiovascular: Normal rate and regular rhythm.   Pulmonary/Chest: Effort normal and breath sounds normal.  Abd:  Soft, NT, non-distended, + BS Neurological: Pt is alert. Not confused , MMSE is 30 today Skin: Skin is warm. No erythema.  Psychiatric: Pt behavior is normal. Thought content normal.     Assessment & Plan:

## 2012-12-12 ENCOUNTER — Telehealth: Payer: Self-pay | Admitting: Internal Medicine

## 2012-12-12 NOTE — Telephone Encounter (Signed)
Patient is requesting an RX for "pre-dental care" on 12/19/12, she will be going out of town tomorrow

## 2012-12-13 ENCOUNTER — Other Ambulatory Visit: Payer: Self-pay | Admitting: Internal Medicine

## 2012-12-13 MED ORDER — AMOXICILLIN 500 MG PO TABS: 500.0000 mg | ORAL_TABLET | Freq: Two times a day (BID) | ORAL | Status: DC

## 2012-12-13 NOTE — Telephone Encounter (Signed)
Pt informed

## 2012-12-13 NOTE — Telephone Encounter (Signed)
[place], I have prescribed her amoxicillin to take, sent in to her pharmacy. Rollene Fare

## 2013-01-27 ENCOUNTER — Other Ambulatory Visit: Payer: Self-pay | Admitting: *Deleted

## 2013-01-27 MED ORDER — METOPROLOL TARTRATE 25 MG PO TABS: ORAL_TABLET | ORAL | Status: DC

## 2013-01-27 MED ORDER — CLOPIDOGREL BISULFATE 75 MG PO TABS: ORAL_TABLET | ORAL | Status: DC

## 2013-01-27 MED ORDER — POTASSIUM CHLORIDE CRYS ER 20 MEQ PO TBCR: EXTENDED_RELEASE_TABLET | ORAL | Status: DC

## 2013-01-27 MED ORDER — HYDROCHLOROTHIAZIDE 25 MG PO TABS: ORAL_TABLET | ORAL | Status: DC

## 2013-01-27 MED ORDER — AMLODIPINE BESYLATE 5 MG PO TABS: ORAL_TABLET | ORAL | Status: DC

## 2013-03-07 ENCOUNTER — Other Ambulatory Visit: Payer: Self-pay | Admitting: Internal Medicine

## 2013-03-07 DIAGNOSIS — Z1231 Encounter for screening mammogram for malignant neoplasm of breast: Secondary | ICD-10-CM

## 2013-03-10 ENCOUNTER — Ambulatory Visit (HOSPITAL_COMMUNITY): Payer: Medicare Other | Attending: Internal Medicine

## 2013-03-17 ENCOUNTER — Ambulatory Visit (HOSPITAL_COMMUNITY)
Admission: RE | Admit: 2013-03-17 | Discharge: 2013-03-17 | Disposition: A | Discharge status: Home / Self Care | Payer: Medicare Other | Source: Ambulatory Visit | Attending: Internal Medicine | Admitting: Internal Medicine

## 2013-03-17 DIAGNOSIS — Z1231 Encounter for screening mammogram for malignant neoplasm of breast: Secondary | ICD-10-CM | POA: Insufficient documentation

## 2013-03-18 ENCOUNTER — Other Ambulatory Visit: Payer: Self-pay | Admitting: Internal Medicine

## 2013-03-18 DIAGNOSIS — R928 Other abnormal and inconclusive findings on diagnostic imaging of breast: Secondary | ICD-10-CM

## 2013-04-01 ENCOUNTER — Ambulatory Visit
Admission: RE | Admit: 2013-04-01 | Discharge: 2013-04-01 | Disposition: A | Discharge status: Home / Self Care | Payer: Medicare Other | Source: Ambulatory Visit | Attending: Internal Medicine | Admitting: Internal Medicine

## 2013-04-01 ENCOUNTER — Other Ambulatory Visit: Payer: Self-pay | Admitting: Internal Medicine

## 2013-04-01 DIAGNOSIS — R928 Other abnormal and inconclusive findings on diagnostic imaging of breast: Secondary | ICD-10-CM

## 2013-04-07 ENCOUNTER — Ambulatory Visit
Admission: RE | Admit: 2013-04-07 | Discharge: 2013-04-07 | Disposition: A | Discharge status: Home / Self Care | Payer: Medicare Other | Source: Ambulatory Visit | Attending: Internal Medicine | Admitting: Internal Medicine

## 2013-04-07 DIAGNOSIS — R928 Other abnormal and inconclusive findings on diagnostic imaging of breast: Secondary | ICD-10-CM

## 2013-04-08 ENCOUNTER — Telehealth: Payer: Self-pay | Admitting: Internal Medicine

## 2013-04-08 LAB — HM MAMMOGRAPHY: HM Mammogram: NEGATIVE

## 2013-04-08 NOTE — Telephone Encounter (Signed)
Patient Information:  Caller Name: [place]  Phone: [phone]  Patient: [place], Gluth  Gender: Female  DOB: Mar 05, 1941  Age: 72 Years  PCP: Cathlean Cower (Adults only)  Office Follow Up:  Does the office need to follow up with this patient?: No  Instructions For The Office: N/A  RN Note:  States cough is keeping her from sleeping.  States is exhausted from coughing.  Afebrile.  Per Epic, patient has an inhaler Rx written 11/11/12, but patient states it was too expensive to fill, and "never thought to use it."   Advised has active refill through May 2014.  Per cough protocol, advised appt within 24 hours; appt scheduled 1115 04/09/13 with Dr. Jenny Reichmann.  krs/can  Symptoms  Reason For Call & Symptoms: cough x > 1 week, and home care measures are not helpful.  Cough disruptive to sleep.  Reviewed Health History In EMR: Yes  Reviewed Medications In EMR: Yes  Reviewed Allergies In EMR: Yes  Reviewed Surgeries / Procedures: Yes  Date of Onset of Symptoms: 03/31/2013  Guideline(s) Used:  Cough  Disposition Per Guideline:   See Today or Tomorrow in Office  Reason For Disposition Reached:   Continuous (nonstop) coughing interferes with work or school and no improvement using cough treatment per Care Advice  Advice Given:  N/A  Patient Will Follow Care Advice:  YES  Appointment Scheduled:  04/09/2013 11:15:00 Appointment Scheduled Provider:  Cathlean Cower (Adults only)

## 2013-04-09 ENCOUNTER — Encounter: Payer: Self-pay | Admitting: Internal Medicine

## 2013-04-09 ENCOUNTER — Telehealth: Payer: Self-pay

## 2013-04-09 ENCOUNTER — Ambulatory Visit (INDEPENDENT_AMBULATORY_CARE_PROVIDER_SITE_OTHER): Payer: Medicare Other | Admitting: Internal Medicine

## 2013-04-09 ENCOUNTER — Ambulatory Visit (INDEPENDENT_AMBULATORY_CARE_PROVIDER_SITE_OTHER)
Admission: RE | Admit: 2013-04-09 | Discharge: 2013-04-09 | Disposition: A | Discharge status: Home / Self Care | Payer: Medicare Other | Source: Ambulatory Visit | Attending: Internal Medicine | Admitting: Internal Medicine

## 2013-04-09 VITALS — BP 120/80 | HR 96 | Temp 98.9°F | Ht 64.0 in | Wt 173.4 lb

## 2013-04-09 DIAGNOSIS — I4891 Unspecified atrial fibrillation: Secondary | ICD-10-CM | POA: Insufficient documentation

## 2013-04-09 DIAGNOSIS — R0989 Other specified symptoms and signs involving the circulatory and respiratory systems: Secondary | ICD-10-CM | POA: Insufficient documentation

## 2013-04-09 DIAGNOSIS — R0600 Dyspnea, unspecified: Secondary | ICD-10-CM

## 2013-04-09 DIAGNOSIS — R062 Wheezing: Secondary | ICD-10-CM | POA: Insufficient documentation

## 2013-04-09 DIAGNOSIS — R001 Bradycardia, unspecified: Secondary | ICD-10-CM | POA: Insufficient documentation

## 2013-04-09 DIAGNOSIS — R0609 Other forms of dyspnea: Secondary | ICD-10-CM

## 2013-04-09 MED ORDER — PREDNISONE 10 MG PO TABS: ORAL_TABLET | ORAL | Status: DC

## 2013-04-09 MED ORDER — FUROSEMIDE 40 MG PO TABS: ORAL_TABLET | ORAL | Status: DC

## 2013-04-09 MED ORDER — HYDROCODONE-HOMATROPINE 5-1.5 MG/5ML PO SYRP: 5.0000 mL | ORAL_SOLUTION | Freq: Four times a day (QID) | ORAL | Status: DC | PRN

## 2013-04-09 MED ORDER — AZITHROMYCIN 250 MG PO TABS: ORAL_TABLET | ORAL | Status: DC

## 2013-04-09 NOTE — Telephone Encounter (Signed)
The patient did want to know if she is to continue the lasix and also will Dr. Mare Ferrari have access to her cxr today??

## 2013-04-09 NOTE — Progress Notes (Signed)
Subjective:    Patient ID: Maria [name], female    DOB: 1941/07/16, 72 y.o.   MRN: [id number]  HPI Here with acute onset mild to mod 2 wks ST, HA, general weakness and malaise, with now prod cough greenish yellow sputum, but Pt denies chest pain, increased sob or doe, wheezing, orthopnea, PND, increased LE swelling, palpitations, dizziness or syncope, except for worsening sob/wheezing the last few days. All developing just before and during the time she had an unfortunate fall with eventual dx right radial fx with conservative tx.  Has hx of afib, last seen per card approx 2 yrs.  Pt denies new neurological symptoms such as new headache, or facial or extremity weakness or numbness   Pt denies polydipsia, polyuria. Past Medical History  Diagnosis Date  . Carpal tunnel syndrome   . Anemia   . Arthritis   . Hypertension   . OSA (obstructive sleep apnea)   . Headache     chronic  . Diverticulosis   . GERD (gastroesophageal reflux disease)   . Palpitations   . Hyperlipidemia   . TIA (transient ischemic attack)   . Vertigo, benign paroxysmal   . Migraine    Past Surgical History  Procedure Laterality Date  . Carpal tunnel release      x 2  . Breast biopsy      rt benign cyst  . Replacement total knee bilateral    . Tonsillectomy    . Bladder repair      reports that she quit smoking about 43 years ago. Her smoking use included Cigarettes. She has a 10 pack-year smoking history. She does not have any smokeless tobacco history on file. She reports that  drinks alcohol. She reports that she does not use illicit drugs. family history includes Aneurysm in her father; Colon cancer in her maternal grandmother; Colon polyps in her mother; Diabetes in her brother and father; Heart disease in her mother; Hypertension in her father; Irritable bowel syndrome in her daughter; and Kidney disease in her father. Allergies  Allergen Reactions  . Ace Inhibitors   . Amoxicillin Hives  . Benicar  (Olmesartan Medoxomil)   . Codeine     REACTION: nausea  . Lisinopril   . Nutritional Supplements     Bee stings-carry an EPIPEN   Current Outpatient Prescriptions on File Prior to Visit  Medication Sig Dispense Refill  . albuterol (PROVENTIL HFA;VENTOLIN HFA) 108 (90 BASE) MCG/ACT inhaler Inhale 2 puffs into the lungs every 6 (six) hours as needed for wheezing or shortness of breath.  1 Inhaler  5  . amLODipine (NORVASC) 5 MG tablet TAKE 1 TABLET EVERY DAY  30 tablet  3  . aspirin 81 MG tablet Take 81 mg by mouth daily.        Marland Kitchen b complex vitamins tablet Take 1 tablet by mouth daily.        Marland Kitchen CALCIUM PO Take by mouth daily.        . Cholecalciferol (VITAMIN D-3 PO) Take by mouth daily.        . clopidogrel (PLAVIX) 75 MG tablet TAKE 1 TABLET (75 MG TOTAL) BY MOUTH DAILY.  30 tablet  3  . fish oil-omega-3 fatty acids 1000 MG capsule Take by mouth daily.        . hydrochlorothiazide (HYDRODIURIL) 25 MG tablet TAKE 1 TABLET (25 MG TOTAL) BY MOUTH DAILY. ONCE A DAY  30 tablet  3  . metoprolol tartrate (LOPRESSOR) 25 MG tablet TAKE  1/2 TABLET BY MOUTH TWICE A DAY  30 tablet  3  . montelukast (SINGULAIR) 10 MG tablet Take 1 tablet (10 mg total) by mouth at bedtime.  90 tablet  3  . omeprazole (PRILOSEC) 40 MG capsule 1 by mouth twice per day  60 capsule  5  . potassium chloride SA (KLOR-CON M20) 20 MEQ tablet TAKE 1 TABLET EVERY DAY  30 tablet  3  . simvastatin (ZOCOR) 10 MG tablet Take 1 tablet (10 mg total) by mouth at bedtime.  90 tablet  3   No current facility-administered medications on file prior to visit.   Review of Systems  Constitutional: Negative for unexpected weight change, or unusual diaphoresis  HENT: Negative for tinnitus.   Eyes: Negative for photophobia and visual disturbance.  Respiratory: Negative for choking and stridor.   Gastrointestinal: Negative for vomiting and blood in stool.  Genitourinary: Negative for hematuria and decreased urine volume.  Musculoskeletal:  Negative for acute joint swelling Skin: Negative for color change and wound.  Neurological: Negative for tremors and numbness other than noted  Psychiatric/Behavioral: Negative for decreased concentration or  hyperactivity.       Objective:   Physical Exam BP 120/80  Pulse 96  Temp(Src) 98.9 F (37.2 C) (Oral)  Ht _0  (1.626 m)  Wt 173 lb 6 oz (78.642 kg)  BMI 29.74 kg/m2  SpO2 94% VS noted, mild ill Constitutional: Pt appears well-developed and well-nourished.  HENT: Head: NCAT.  Right Ear: External ear normal.  Left Ear: External ear normal.  Eyes: Conjunctivae and EOM are normal. Pupils are equal, round, and reactive to light.  Neck: Normal range of motion. Neck supple.  Cardiovascular: Normal rate and regular rhythm.   Pulmonary/Chest: Effort normal and breath sounds decreased with few bilat wheezes but also unexpected few bibas rales.  RUE in soft cast/sling Neurological: Pt is alert. Not confused , motor o/w intact Skin: Skin is warm. No erythema.  Psychiatric: Pt behavior is normal. Thought content normal.     Assessment & Plan:

## 2013-04-09 NOTE — Telephone Encounter (Signed)
Yes to both.

## 2013-04-09 NOTE — Assessment & Plan Note (Signed)
?  Bronchospasm vs chf vs both - overall mild, eval and tx as above

## 2013-04-09 NOTE — Assessment & Plan Note (Addendum)
Etiology not clear, except I think has element of bronchitis with wheezing possibly related to bronchospasm - for zpack, cough med, predpack as well, ECG reviewed as per emr

## 2013-04-09 NOTE — Patient Instructions (Addendum)
Your EKG was OK today (did not show the atrial fibrillation) Please take all new medication as prescribed - the antibiotic, cough medicine, prednisone (low dose), and lasix 40 mg for 3 days only Please continue all other medications as before, except hold off on taking the HCTZ fluid pill for 3 days when you take the lasix Please go to the XRAY Department in the Basement (go straight as you get off the elevator) for the x-ray testing You will be contacted regarding the referral for: Dr Brackbill/cardiology Please go to the XRAY Department in the Basement (go straight as you get off the elevator) for the x-ray testing You will be contacted by phone if any changes need to be made immediately.  Otherwise, you will receive a letter about your results with an explanation Please remember to sign up for My Chart if you have not done so, as this will be important to you in the future with finding out test results, communicating by private email, and scheduling acute appointments online when needed.

## 2013-04-09 NOTE — Assessment & Plan Note (Signed)
Cant r/o pna, though may have pulm edema - for cxr

## 2013-04-09 NOTE — Assessment & Plan Note (Signed)
ECG reviewed as per emr, with rales to take lasix 26m daily for 3 days, then resume hctz, also to f/u with Dr BSharyne Richters last contact July 2012

## 2013-04-10 NOTE — Telephone Encounter (Signed)
Patient informed

## 2013-04-28 ENCOUNTER — Encounter: Payer: Self-pay | Admitting: Cardiology

## 2013-04-28 ENCOUNTER — Ambulatory Visit (INDEPENDENT_AMBULATORY_CARE_PROVIDER_SITE_OTHER): Payer: Medicare Other | Admitting: Cardiology

## 2013-04-28 VITALS — BP 126/72 | HR 84 | Ht 64.0 in | Wt 172.8 lb

## 2013-04-28 DIAGNOSIS — J209 Acute bronchitis, unspecified: Secondary | ICD-10-CM

## 2013-04-28 DIAGNOSIS — R002 Palpitations: Secondary | ICD-10-CM

## 2013-04-28 DIAGNOSIS — R05 Cough: Secondary | ICD-10-CM

## 2013-04-28 DIAGNOSIS — G459 Transient cerebral ischemic attack, unspecified: Secondary | ICD-10-CM

## 2013-04-28 MED ORDER — AZITHROMYCIN 250 MG PO TABS: ORAL_TABLET | ORAL | Status: DC

## 2013-04-28 NOTE — Patient Instructions (Signed)
START A ZPAK , RX SENT TO [facility]  Your physician recommends that you continue on your current medications as directed. Please refer to the Current Medication list given to you today.  Your physician wants you to follow-up in: 1 year You will receive a reminder letter in the mail two months in advance. If you don't receive a letter, please call our office to schedule the follow-up appointment.

## 2013-04-28 NOTE — Assessment & Plan Note (Signed)
The patient notes occasional awareness of palpitations.  On examination today she is having occasional PVCs on bedside exam of her rhythm is basically regular.

## 2013-04-28 NOTE — Assessment & Plan Note (Signed)
The patient remains on aspirin and Plavix and has had no recurrent TIA symptoms

## 2013-04-28 NOTE — Progress Notes (Signed)
Maria [name] Date of Birth:  August 15, 1941 [facility] [address] [place], [place]  [address] [phone]         Fax   [phone]  History of Present Illness: This 72 year old woman is seen for a scheduled followup office visit.  We last saw her in May 2012.  She has a history of a suspected TIA in February 2012.  She was discharged home on him.  Aspirin and Plavix which she has continued.  She has a past history of palpitations.  Review of her chart does not reveal any documentation of atrial fibrillation that I can find today her recent EKG from 4/23/shows normal sinus rhythm with occasional PVCs.  The patient does not have any history of ischemic heart disease.  She had a normal stress thallium on 04/19/09 which showed no ischemia and her ejection fraction was 65%.  She had an echocardiogram 03/07/10 which showed mild aortic sclerosis with trivial aortic insufficiency and normal left ventricular systolic and diastolic function and normal pulmonary artery pressure.  The patient has a history of suspected COPD.  She has a history of recurrent pulmonary infections and bronchitis.  She has had a recent cough [place]  she is requesting a Zithromax Z-Pak to have on hand if her cough worsens.  Since we last saw her she tripped and fell and fractured her right elbow.   Current Outpatient Prescriptions  Medication Sig Dispense Refill  . amLODipine (NORVASC) 5 MG tablet TAKE 1 TABLET EVERY DAY  30 tablet  3  . aspirin 81 MG tablet Take 81 mg by mouth daily.        Marland Kitchen b complex vitamins tablet Take 1 tablet by mouth daily.        Marland Kitchen CALCIUM PO Take by mouth daily.        . Cholecalciferol (VITAMIN D-3 PO) Take by mouth daily.        . clopidogrel (PLAVIX) 75 MG tablet TAKE 1 TABLET (75 MG TOTAL) BY MOUTH DAILY.  30 tablet  3  . fish oil-omega-3 fatty acids 1000 MG capsule Take by mouth daily.        . hydrochlorothiazide (HYDRODIURIL) 25 MG  tablet TAKE 1 TABLET (25 MG TOTAL) BY MOUTH DAILY. ONCE A DAY  30 tablet  3  . metoprolol tartrate (LOPRESSOR) 25 MG tablet TAKE 1/2 TABLET BY MOUTH TWICE A DAY  30 tablet  3  . montelukast (SINGULAIR) 10 MG tablet Take 1 tablet (10 mg total) by mouth at bedtime.  90 tablet  3  . omeprazole (PRILOSEC) 40 MG capsule 1 by mouth twice per day  60 capsule  5  . potassium chloride SA (KLOR-CON M20) 20 MEQ tablet TAKE 1 TABLET EVERY DAY  30 tablet  3  . simvastatin (ZOCOR) 10 MG tablet Take 1 tablet (10 mg total) by mouth at bedtime.  90 tablet  3  . albuterol (PROVENTIL HFA;VENTOLIN HFA) 108 (90 BASE) MCG/ACT inhaler Inhale 2 puffs into the lungs every 6 (six) hours as needed for wheezing or shortness of breath.  1 Inhaler  5  . azithromycin (ZITHROMAX) 250 MG tablet 2 tablets on day 1 and then 1 daily until finished  6 each  0   No current facility-administered medications for this visit.    Allergies  Allergen Reactions  . Ace Inhibitors   . Amoxicillin Hives  . Benicar (Olmesartan Medoxomil)   . Codeine  REACTION: nausea  . Lisinopril   . Nutritional Supplements     Bee stings-carry an EPIPEN    Patient Active Problem List   Diagnosis Date Noted  . Palpitations     Priority: High  . TIA (transient ischemic attack)     Priority: High  . Atrial fibrillation 04/09/2013  . Dyspnea 04/09/2013  . Chest rales 04/09/2013  . Wheezing 04/09/2013  . Dysphagia 11/11/2012  . Acute bronchitis 10/22/2012  . Impaired glucose tolerance 05/24/2011  . Migraine   . Preventative health care 05/21/2011  . Hyperlipidemia   . Vertigo, benign paroxysmal   . Obstructive sleep apnea 03/22/2011  . Paresthesias 03/22/2011  . Insomnia 03/22/2011  . ESOPHAGEAL STRICTURE 08/11/2010  . GERD 08/11/2010    History  Smoking status  . Former Smoker -- 1.00 packs/day for 10 years  . Types: Cigarettes  . Quit date: 12/18/1969  Smokeless tobacco  . Not on file    History  Alcohol Use  . 0 - 1  oz/week  . 0-2 drink(s) per week    Comment: ocassional use    Family History  Problem Relation Age of Onset  . Diabetes Father   . Aneurysm Father   . Kidney disease Father   . Hypertension Father   . Diabetes Brother   . Heart disease Mother   . Colon polyps Mother   . Colon cancer Maternal Grandmother   . Irritable bowel syndrome Daughter     Review of Systems: Constitutional: no fever chills diaphoresis or fatigue or change in weight.  Head and neck: no hearing loss, no epistaxis, no photophobia or visual disturbance. Respiratory: No cough, shortness of breath or wheezing. Cardiovascular: No chest pain peripheral edema, palpitations. Gastrointestinal: No abdominal distention, no abdominal pain, no change in bowel habits hematochezia or melena. Genitourinary: No dysuria, no frequency, no urgency, no nocturia. Musculoskeletal:No arthralgias, no back pain, no gait disturbance or myalgias. Neurological: No dizziness, no headaches, no numbness, no seizures, no syncope, no weakness, no tremors. Hematologic: No lymphadenopathy, no easy bruising. Psychiatric: No confusion, no hallucinations, no sleep disturbance.    Physical Exam: Filed Vitals:   04/28/13 1701  BP: 126/72  Pulse: 84   the general appearance reveals a well-developed well-nourished woman in no distress.  She has decreased mobility of the right arm because of the recent elbow fracture.The head and neck exam reveals pupils equal and reactive.  Extraocular movements are full.  There is no scleral icterus.  The mouth and pharynx are normal.  The neck is supple.  The carotids reveal no bruits.  The jugular venous pressure is normal.  The  thyroid is not enlarged.  There is no lymphadenopathy.  The chest is clear to percussion and auscultation.  There are no rales or rhonchi.  Expansion of the chest is symmetrical.  The precordium is quiet.  The first heart sound is normal.  The second heart sound is physiologically split.   There is no murmur gallop rub or click.  There is no abnormal lift or heave.  The abdomen is soft and nontender.  The bowel sounds are normal.  The liver and spleen are not enlarged.  There are no abdominal masses.  There are no abdominal bruits.  Extremities reveal good pedal pulses.  There is no phlebitis or edema.  There is no cyanosis or clubbing.  Strength is normal and symmetrical in all extremities.  There is no lateralizing weakness.  There are no sensory deficits.  The skin is  warm and dry.  There is no rash.  EKG from 04/06/13 was reviewed and demonstrates normal sinus rhythm with PVCs   Assessment / Plan:  Continue same medication.  Recheck in one year for followup office visit

## 2013-04-28 NOTE — Assessment & Plan Note (Signed)
The patient is not acutely ill but does have a persistent cough and we did give her a Z-Pak which she will use if cough worsens [place].

## 2013-05-06 ENCOUNTER — Ambulatory Visit: Payer: Medicare Other | Admitting: Cardiovascular Disease

## 2013-05-20 ENCOUNTER — Other Ambulatory Visit: Payer: Self-pay | Admitting: *Deleted

## 2013-05-20 MED ORDER — SIMVASTATIN 10 MG PO TABS: 10.0000 mg | ORAL_TABLET | Freq: Every day | ORAL | Status: DC

## 2013-05-26 ENCOUNTER — Other Ambulatory Visit: Payer: Self-pay | Admitting: *Deleted

## 2013-05-26 MED ORDER — HYDROCHLOROTHIAZIDE 25 MG PO TABS: ORAL_TABLET | ORAL | Status: DC

## 2013-05-26 MED ORDER — CLOPIDOGREL BISULFATE 75 MG PO TABS: ORAL_TABLET | ORAL | Status: DC

## 2013-05-26 MED ORDER — METOPROLOL TARTRATE 25 MG PO TABS: ORAL_TABLET | ORAL | Status: DC

## 2013-05-26 MED ORDER — AMLODIPINE BESYLATE 5 MG PO TABS: ORAL_TABLET | ORAL | Status: DC

## 2013-05-26 MED ORDER — POTASSIUM CHLORIDE CRYS ER 20 MEQ PO TBCR: EXTENDED_RELEASE_TABLET | ORAL | Status: DC

## 2013-06-23 ENCOUNTER — Other Ambulatory Visit: Payer: Self-pay | Admitting: Sports Medicine

## 2013-06-23 DIAGNOSIS — R52 Pain, unspecified: Secondary | ICD-10-CM

## 2013-07-17 ENCOUNTER — Ambulatory Visit
Admission: RE | Admit: 2013-07-17 | Discharge: 2013-07-17 | Disposition: A | Discharge status: Home / Self Care | Payer: Medicare Other | Source: Ambulatory Visit | Attending: Sports Medicine | Admitting: Sports Medicine

## 2013-07-17 DIAGNOSIS — R52 Pain, unspecified: Secondary | ICD-10-CM

## 2013-07-30 ENCOUNTER — Ambulatory Visit: Payer: Medicare Other | Admitting: Internal Medicine

## 2013-07-30 ENCOUNTER — Encounter: Payer: Self-pay | Admitting: Internal Medicine

## 2013-07-30 ENCOUNTER — Ambulatory Visit (INDEPENDENT_AMBULATORY_CARE_PROVIDER_SITE_OTHER)
Admission: RE | Admit: 2013-07-30 | Discharge: 2013-07-30 | Disposition: A | Discharge status: Home / Self Care | Payer: Medicare Other | Source: Ambulatory Visit | Attending: Internal Medicine | Admitting: Internal Medicine

## 2013-07-30 ENCOUNTER — Telehealth: Payer: Self-pay | Admitting: Internal Medicine

## 2013-07-30 ENCOUNTER — Ambulatory Visit (INDEPENDENT_AMBULATORY_CARE_PROVIDER_SITE_OTHER): Payer: Medicare Other | Admitting: Internal Medicine

## 2013-07-30 VITALS — BP 122/80 | HR 73 | Temp 99.3°F | Ht 64.0 in | Wt 163.0 lb

## 2013-07-30 DIAGNOSIS — R05 Cough: Secondary | ICD-10-CM

## 2013-07-30 DIAGNOSIS — J209 Acute bronchitis, unspecified: Secondary | ICD-10-CM

## 2013-07-30 DIAGNOSIS — R7309 Other abnormal glucose: Secondary | ICD-10-CM

## 2013-07-30 DIAGNOSIS — R062 Wheezing: Secondary | ICD-10-CM

## 2013-07-30 DIAGNOSIS — R7302 Impaired glucose tolerance (oral): Secondary | ICD-10-CM

## 2013-07-30 DIAGNOSIS — R059 Cough, unspecified: Secondary | ICD-10-CM

## 2013-07-30 MED ORDER — PREDNISONE 10 MG PO TABS: ORAL_TABLET | ORAL | Status: DC

## 2013-07-30 MED ORDER — HYDROCODONE-HOMATROPINE 5-1.5 MG/5ML PO SYRP: 5.0000 mL | ORAL_SOLUTION | Freq: Four times a day (QID) | ORAL | Status: DC | PRN

## 2013-07-30 MED ORDER — METHYLPREDNISOLONE ACETATE 80 MG/ML IJ SUSP
80.0000 mg | Freq: Once | INTRAMUSCULAR | Status: AC
  Administered 2013-07-30: 80 mg via INTRAMUSCULAR

## 2013-07-30 MED ORDER — AZITHROMYCIN 250 MG PO TABS: ORAL_TABLET | ORAL | Status: DC

## 2013-07-30 NOTE — Assessment & Plan Note (Signed)
Mild to mod, for antibx course,  to f/u any worsening symptoms or concerns, for cxr r/o pna, also refer pulm per pt request given hx

## 2013-07-30 NOTE — Telephone Encounter (Signed)
Maria [name] called back after she cancelled her 2:00 appt yelling about not getting a call from a nurse.  There was not message in the chart where she had called earlier.  She said we were inefficient and kept yelling and using unkind words and a cuss word.  She said she wanted a nurse to tell her if she needed an appt.  While on the phone she was coughing.  Her appt said it was for a cold.  I told her she would need to be seen for prescriptions for a cough.  I told her the open appts.  She said she would call back if she could get a ride.

## 2013-07-30 NOTE — Patient Instructions (Signed)
You had the steroid shot today Please take all new medication as prescribed - the antibiotic, cough medicine, prednisone Please continue all other medications as before Please go to the XRAY Department in the Basement (go straight as you get off the elevator) for the x-ray testing You will be contacted by phone if any changes need to be made immediately.  Otherwise, you will receive a letter about your results with an explanation, but please check with MyChart first.  You will be contacted regarding the referral for: pulmonary  Please remember to sign up for My Chart if you have not done so, as this will be important to you in the future with finding out test results, communicating by private email, and scheduling acute appointments online when needed.

## 2013-07-30 NOTE — Progress Notes (Signed)
Subjective:    Patient ID: Maria [name], female    DOB: 1941/11/25, 72 y.o.   MRN: [id number]  HPI Here with acute onset mild to mod 2-3 days ST, HA, general weakness and malaise, with prod cough greenish sputum, but Pt denies chest pain, increased sob or doe, wheezing, orthopnea, PND, increased LE swelling, palpitations, dizziness or syncope, except for onset mild wheezing/sob last PM.  Tends to have this to varying degrees several times per yr.  Has seen pulm in past, asks for referral again, but not right away as [place].  Also mentions she had possible tx with levaquin [place] in the [place] and had a rash, but not really sure of the name of the antibx, but will check with [facility] to find out.  Pt denies polydipsia, polyuria  Past Medical History  Diagnosis Date  . Carpal tunnel syndrome   . Anemia   . Arthritis   . Hypertension   . OSA (obstructive sleep apnea)   . Headache(784.0)     chronic  . Diverticulosis   . GERD (gastroesophageal reflux disease)   . Palpitations   . Hyperlipidemia   . TIA (transient ischemic attack)   . Vertigo, benign paroxysmal   . Migraine    Past Surgical History  Procedure Laterality Date  . Carpal tunnel release      x 2  . Breast biopsy      rt benign cyst  . Replacement total knee bilateral    . Tonsillectomy    . Bladder repair      reports that she quit smoking about 43 years ago. Her smoking use included Cigarettes. She has a 10 pack-year smoking history. She does not have any smokeless tobacco history on file. She reports that  drinks alcohol. She reports that she does not use illicit drugs. family history includes Aneurysm in her father; Colon cancer in her maternal grandmother; Colon polyps in her mother; Diabetes in her brother and father; Heart disease in her mother; Hypertension in her father; Irritable bowel syndrome in her daughter; Kidney disease in her  father. Allergies  Allergen Reactions  . Ace Inhibitors   . Amoxicillin Hives  . Benicar [Olmesartan Medoxomil]   . Codeine     REACTION: nausea  . Lisinopril   . Nutritional Supplements     Bee stings-carry an EPIPEN   Current Outpatient Prescriptions on File Prior to Visit  Medication Sig Dispense Refill  . albuterol (PROVENTIL HFA;VENTOLIN HFA) 108 (90 BASE) MCG/ACT inhaler Inhale 2 puffs into the lungs every 6 (six) hours as needed for wheezing or shortness of breath.  1 Inhaler  5  . amLODipine (NORVASC) 5 MG tablet TAKE 1 TABLET EVERY DAY  30 tablet  3  . aspirin 81 MG tablet Take 81 mg by mouth daily.        Marland Kitchen azithromycin (ZITHROMAX) 250 MG tablet 2 tablets on day 1 and then 1 daily until finished  6 each  0  . b complex vitamins tablet Take 1 tablet by mouth daily.        Marland Kitchen CALCIUM PO Take by mouth daily.        . Cholecalciferol (VITAMIN D-3 PO) Take by mouth daily.        . clopidogrel (PLAVIX) 75 MG tablet TAKE 1 TABLET (75 MG TOTAL) BY MOUTH DAILY.  30 tablet  3  . fish oil-omega-3 fatty acids  1000 MG capsule Take by mouth daily.        . hydrochlorothiazide (HYDRODIURIL) 25 MG tablet TAKE 1 TABLET (25 MG TOTAL) BY MOUTH DAILY. ONCE A DAY  30 tablet  3  . metoprolol tartrate (LOPRESSOR) 25 MG tablet TAKE 1/2 TABLET BY MOUTH TWICE A DAY  30 tablet  3  . montelukast (SINGULAIR) 10 MG tablet Take 1 tablet (10 mg total) by mouth at bedtime.  90 tablet  3  . omeprazole (PRILOSEC) 40 MG capsule 1 by mouth twice per day  60 capsule  5  . potassium chloride SA (KLOR-CON M20) 20 MEQ tablet TAKE 1 TABLET EVERY DAY  30 tablet  3  . simvastatin (ZOCOR) 10 MG tablet Take 1 tablet (10 mg total) by mouth at bedtime.  90 tablet  3   No current facility-administered medications on file prior to visit.   Review of Systems  Constitutional: Negative for unexpected weight change, or unusual diaphoresis  HENT: Negative for tinnitus.   Eyes: Negative for photophobia and visual disturbance.   Respiratory: Negative for choking and stridor.   Gastrointestinal: Negative for vomiting and blood in stool.  Genitourinary: Negative for hematuria and decreased urine volume.  Musculoskeletal: Negative for acute joint swelling Skin: Negative for color change and wound.  Neurological: Negative for tremors and numbness other than noted  Psychiatric/Behavioral: Negative for decreased concentration or  hyperactivity.       Objective:   Physical Exam BP 122/80  Pulse 73  Temp(Src) 99.3 F (37.4 C) (Oral)  Ht _0  (1.626 m)  Wt 163 lb (73.936 kg)  BMI 27.97 kg/m2  SpO2 95% VS noted, mild ill Constitutional: Pt appears well-developed and well-nourished.  HENT: Head: NCAT.  Right Ear: External ear normal.  Left Ear: External ear normal.  Bilat tm's with mild erythema left >> right.  Max sinus areas non tender.  Pharynx with mild erythema, no exudate Eyes: Conjunctivae and EOM are normal. Pupils are equal, round, and reactive to light.  Neck: Normal range of motion. Neck supple.  Cardiovascular: Normal rate and regular rhythm.   Pulmonary/Chest: Effort normal and breath sounds  Mild decreased, no rales but mild diffuse wheezes bilat.  Neurological: Pt is alert. Not confused  Skin: Skin is warm. No erythema.  Psychiatric: Pt behavior is normal. Thought content normal. But some irritable today     Assessment & Plan:

## 2013-07-30 NOTE — Assessment & Plan Note (Signed)
Mild to mod, for predpac, depomedrol IM todya,  to f/u any worsening symptoms or concerns

## 2013-07-30 NOTE — Assessment & Plan Note (Signed)
.  asympt, pt to call with any onset polys or cbg > 200 with steroid tx

## 2013-07-31 ENCOUNTER — Telehealth: Payer: Self-pay

## 2013-07-31 MED ORDER — EPINEPHRINE 0.3 MG/0.3ML IJ SOAJ: 0.3000 mg | Freq: Once | INTRAMUSCULAR | Status: DC

## 2013-07-31 NOTE — Telephone Encounter (Signed)
The patient would like a refill on her Epipen, do not see on med. list

## 2013-07-31 NOTE — Telephone Encounter (Signed)
Done to [facility]

## 2013-08-06 ENCOUNTER — Institutional Professional Consult (permissible substitution): Payer: Medicare Other | Admitting: Internal Medicine

## 2013-08-20 ENCOUNTER — Encounter: Payer: Self-pay | Admitting: Internal Medicine

## 2013-08-25 ENCOUNTER — Institutional Professional Consult (permissible substitution): Payer: Medicare Other | Admitting: Internal Medicine

## 2013-08-28 ENCOUNTER — Encounter: Payer: Self-pay | Admitting: Internal Medicine

## 2013-09-01 ENCOUNTER — Encounter: Payer: Self-pay | Admitting: Internal Medicine

## 2013-09-01 ENCOUNTER — Ambulatory Visit (INDEPENDENT_AMBULATORY_CARE_PROVIDER_SITE_OTHER): Payer: Medicare Other | Admitting: Internal Medicine

## 2013-09-01 VITALS — BP 120/80 | HR 65 | Temp 98.4°F | Ht 62.0 in | Wt 160.0 lb

## 2013-09-01 DIAGNOSIS — R05 Cough: Secondary | ICD-10-CM

## 2013-09-01 NOTE — Patient Instructions (Addendum)
Stop fish oil  Take prilosec 20 mg  Take 30- 60 min before your first and last meals of the day until return   GERD (REFLUX)  is an extremely common cause of respiratory symptoms, many times with no significant heartburn at all.    It can be treated with medication, but also with lifestyle changes including avoidance of late meals, excessive alcohol, smoking cessation, and avoid fatty foods, chocolate, peppermint, colas, red wine, and acidic juices such as orange juice.  NO MINT OR MENTHOL PRODUCTS SO NO COUGH DROPS  USE SUGARLESS CANDY INSTEAD (jolley ranchers or Stover's)  NO OIL BASED VITAMINS - use powdered substitutes.   Avoid macrobid/ nitrofurantoin/macrodantin for kidney   Work on inhaler technique:  relax and gently blow all the way out then take a nice smooth deep breath back in, triggering the inhaler at same time you start breathing in.  Hold for up to 5 seconds if you can.  Rinse and gargle with water when done     Please schedule a follow up office visit in 6 weeks, call sooner if needed with pfts

## 2013-09-01 NOTE — Progress Notes (Signed)
  Subjective:    Patient ID: Maria [name], female    DOB: 1941-02-27  MRN: [id number]  HPI  19 yowf former smoker remembers lots of missing high school  days in winter with recurrent cough then starting 2000 recurrent cough despite quitting smoking in 1971 referred 09/01/2013 by Dr Marshall Cork to pulmonary clinic for eval of recurrent cough.     09/01/2013 1st [facility] office visit/ Kabeer Hoagland cc recurrent severe cough / sob which continue even p acute treatments with abx > continue to cough daily x 2 years esp p stirring and then again 5pm,  Usually dry unless having one of her exac,  with doe x hills,  Best rx was neb lasts x one day.  Better off lopressor/ has received macrodantin in past for uti's   Sleeping ok on cpap without nocturnal  or early am exacerbation  of respiratory  c/o's or need for noct saba. Also denies any obvious fluctuation of symptoms with weather or environmental changes or other aggravating or alleviating factors except as outlined above   Current Medications, Allergies, Complete Past Medical History, Past Surgical History, Family History, and Social History were reviewed in [employer] record.         Review of Systems  Constitutional: Negative for fever, chills and unexpected weight change.  HENT: Positive for dental problem. Negative for ear pain, nosebleeds, congestion, sore throat, rhinorrhea, sneezing, trouble swallowing, voice change, postnasal drip and sinus pressure.   Eyes: Negative for visual disturbance.  Respiratory: Positive for cough and shortness of breath. Negative for choking.   Cardiovascular: Negative for chest pain and leg swelling.  Gastrointestinal: Negative for vomiting, abdominal pain and diarrhea.  Genitourinary: Negative for difficulty urinating.  Musculoskeletal: Negative for arthralgias.  Skin: Negative for rash.  Neurological: Positive for headaches. Negative for tremors and syncope.  Hematological:  Bruises/bleeds easily.       Objective:   Physical Exam  Wt Readings from Last 3 Encounters:  09/01/13 160 lb (72.576 kg)  07/30/13 163 lb (73.936 kg)  04/28/13 172 lb 12.8 oz (78.382 kg)     HEENT: nl dentition, turbinates, and orophanx. Nl external ear canals without cough reflex   NECK :  without JVD/Nodes/TM/ nl carotid upstrokes bilaterally   LUNGS: no acc muscle use, trace insp crackles in bases with cough at end of deep insp    CV:  RRR  no s3 or murmur or increase in P2, no edema   ABD:  soft and nontender with nl excursion in the supine position. No bruits or organomegaly, bowel sounds nl  MS:  warm without deformities, calf tenderness, cyanosis or clubbing  SKIN: warm and dry without lesions    NEURO:  alert, approp, no deficits     cxr  07/31/13 No acute cardiopulmonary process.        Assessment & Plan:

## 2013-09-02 DIAGNOSIS — R05 Cough: Secondary | ICD-10-CM | POA: Insufficient documentation

## 2013-09-02 NOTE — Assessment & Plan Note (Signed)
The most common causes of chronic cough in immunocompetent adults include the following: upper airway cough syndrome (UACS), previously referred to as postnasal drip syndrome (PNDS), which is caused by variety of rhinosinus conditions; (2) asthma; (3) GERD; (4) chronic bronchitis from cigarette smoking or other inhaled environmental irritants; (5) nonasthmatic eosinophilic bronchitis; and (6) bronchiectasis.   These conditions, singly or in combination, have accounted for up to 94% of the causes of chronic cough in prospective studies.   Other conditions have constituted no >6% of the causes in prospective studies These have included bronchogenic carcinoma, chronic interstitial pneumonia, sarcoidosis, left ventricular failure, ACEI-induced cough, and aspiration from a condition associated with pharyngeal dysfunction.    Chronic cough is often simultaneously caused by more than one condition. A single cause has been found from 38 to 82% of the time, multiple causes from 18 to 62%. Multiply caused cough has been the result of three diseases up to 42% of the time.       She has a h/o gerd and only takes ppi prn so will ask her to take it more consistently with diet  She's better off lopressor suggesting cough variant asthma > reviewed optimal mdi use  She has an insp cough with a few crackles in bases and ? How much macrodantin exp > avoid  Needs f/u pft's and ex sats on return

## 2013-09-23 DIAGNOSIS — N10 Acute tubulo-interstitial nephritis: Secondary | ICD-10-CM | POA: Insufficient documentation

## 2013-09-23 DIAGNOSIS — I10 Essential (primary) hypertension: Secondary | ICD-10-CM | POA: Insufficient documentation

## 2013-09-23 DIAGNOSIS — J45909 Unspecified asthma, uncomplicated: Secondary | ICD-10-CM | POA: Insufficient documentation

## 2013-09-23 HISTORY — DX: Acute pyelonephritis: N10

## 2013-09-25 ENCOUNTER — Encounter: Payer: Self-pay | Admitting: Gastroenterology

## 2013-09-29 ENCOUNTER — Encounter: Payer: Self-pay | Admitting: Internal Medicine

## 2013-09-29 ENCOUNTER — Ambulatory Visit (INDEPENDENT_AMBULATORY_CARE_PROVIDER_SITE_OTHER): Payer: Medicare Other | Admitting: Gastroenterology

## 2013-09-29 ENCOUNTER — Other Ambulatory Visit: Payer: Self-pay | Admitting: *Deleted

## 2013-09-29 ENCOUNTER — Encounter: Payer: Self-pay | Admitting: Gastroenterology

## 2013-09-29 VITALS — BP 100/70 | HR 80 | Ht 62.0 in | Wt 161.8 lb

## 2013-09-29 DIAGNOSIS — Z8719 Personal history of other diseases of the digestive system: Secondary | ICD-10-CM | POA: Insufficient documentation

## 2013-09-29 DIAGNOSIS — Z86010 Personal history of colonic polyps: Secondary | ICD-10-CM

## 2013-09-29 DIAGNOSIS — R1319 Other dysphagia: Secondary | ICD-10-CM | POA: Insufficient documentation

## 2013-09-29 MED ORDER — NA SULFATE-K SULFATE-MG SULF 17.5-3.13-1.6 GM/177ML PO SOLN: 1.0000 | Freq: Once | ORAL | Status: AC

## 2013-09-29 NOTE — Progress Notes (Signed)
Agree with Maria [name] plans.

## 2013-09-29 NOTE — Patient Instructions (Signed)
You have been scheduled for a colonoscopy with propofol. Please follow written instructions given to you at your visit today.  Please pick up your prep kit at the pharmacy within the next 1-3 days. [place] If you use inhalers (even only as needed), please bring them with you on the day of your procedure.

## 2013-09-29 NOTE — Progress Notes (Signed)
09/29/2013 Maria [name] [id number] 1941-12-16   HISTORY OF PRESENT ILLNESS:  Patient is a 72 year old female who is a patient of Dr. Celesta Aver.  She had a colonoscopy in 08/2010 at which time she had 3 polyps removed, which were tubular adenomas.  Also had moderate diverticulosis in the sigmoid colon.  It was recommended that she  Have repeat colonoscopy in 3 years from that time.  Denies any lower GI complaints including change in bowels or rectal bleeding.  She is here today to schedule that procedure.  She also had an EGD at that time, which showed a stricture at the GE junction and was dilated with 54 Fr Maloney.  Today she complaints of recurrent dysphagia.  Says that dysphagia began again about 6 months ago but has been getting worse since about one month ago.  Has dysphagia to solid food only such as bread and chicken.  No choking.  No abdominal pain or weight loss.  She had been taking prilosec 40 mg daily, but discontinued it some time ago.  Recently she's had a cough, which pulmonary thinks is related to GERD.  They put her back on prilosec 40 mg BID just last week.  She is on plavix for TIA's, which is prescribed by her PCP, Dr. Jenny Reichmann.    Past Medical History  Diagnosis Date  . Carpal tunnel syndrome   . Anemia   . Arthritis   . Hypertension   . OSA (obstructive sleep apnea)   . Headache(784.0)     chronic  . Diverticulosis   . GERD (gastroesophageal reflux disease)   . Palpitations   . Hyperlipidemia   . TIA (transient ischemic attack)   . Vertigo, benign paroxysmal   . Migraine   . Personal history of colonic adenomas 09/15/2010  . Nephritis    Past Surgical History  Procedure Laterality Date  . Carpal tunnel release      x 2  . Breast biopsy      rt benign cyst  . Replacement total knee bilateral    . Tonsillectomy    . Bladder repair      reports that she quit smoking about 43 years ago. Her smoking use included Cigarettes. She has a 10 pack-year smoking  history. She has never used smokeless tobacco. She reports that she drinks alcohol. She reports that she does not use illicit drugs. family history includes Aneurysm in her father; Chronic bronchitis in her mother; Colon cancer in her maternal grandmother; Colon polyps in her mother; Diabetes in her brother and father; Emphysema in her maternal grandfather; Heart disease in her mother; Hypertension in her father; Irritable bowel syndrome in her daughter; Kidney disease in her father. Allergies  Allergen Reactions  . Ace Inhibitors   . Amoxicillin Hives  . Benicar [Olmesartan Medoxomil]   . Codeine     REACTION: nausea  . Lisinopril   . Nutritional Supplements     Bee stings-carry an [facility]      Outpatient Encounter Prescriptions as of 09/29/2013  Medication Sig Dispense Refill  . albuterol (PROVENTIL HFA;VENTOLIN HFA) 108 (90 BASE) MCG/ACT inhaler Inhale 2 puffs into the lungs every 6 (six) hours as needed for wheezing or shortness of breath.  1 Inhaler  5  . amLODipine (NORVASC) 5 MG tablet TAKE 1 TABLET EVERY DAY  30 tablet  3  . aspirin 81 MG tablet Take 81 mg by mouth daily.        Marland Kitchen b complex vitamins tablet Take  1 tablet by mouth daily.        Marland Kitchen CALCIUM PO Take by mouth daily.        . Cholecalciferol (VITAMIN D-3 PO) Take by mouth daily.        . clopidogrel (PLAVIX) 75 MG tablet TAKE 1 TABLET (75 MG TOTAL) BY MOUTH DAILY.  30 tablet  3  . EPINEPHrine (EPIPEN) 0.3 mg/0.3 mL SOAJ injection Inject 0.3 mL (0.3 mg total) into the muscle once.  2 Device  1  . hydrochlorothiazide (HYDRODIURIL) 25 MG tablet TAKE 1 TABLET (25 MG TOTAL) BY MOUTH DAILY. ONCE A DAY  30 tablet  3  . HYDROcodone-homatropine (HYCODAN) 5-1.5 MG/5ML syrup Take 5 mL by mouth every 6 (six) hours as needed for cough.  240 mL  0  . montelukast (SINGULAIR) 10 MG tablet Take 1 tablet (10 mg total) by mouth at bedtime.  90 tablet  3  . omeprazole (PRILOSEC) 40 MG capsule Take 40 mg by mouth 2 (two) times daily.       .  potassium chloride SA (KLOR-CON M20) 20 MEQ tablet TAKE 1 TABLET EVERY DAY  30 tablet  3  . Probiotic Product (PROBIOTIC DAILY) CAPS Take 1 capsule by mouth 2 (two) times daily.      . simvastatin (ZOCOR) 10 MG tablet Take 1 tablet (10 mg total) by mouth at bedtime.  90 tablet  3   No facility-administered encounter medications on file as of 09/29/2013.     REVIEW OF SYSTEMS  : All other systems reviewed and negative except where noted in the History of Present Illness.   PHYSICAL EXAM: BP 100/70  Pulse 80  Ht 5' 2" (1.575 m)  Wt 73.392 kg (161 lb 12.8 oz)  BMI 29.59 kg/m2 General: Well developed white female in no acute distress Head: Normocephalic and atraumatic Eyes:  Sclerae anicteric, conjunctiva pink. Ears: Normal auditory acuity Lungs: Clear throughout to auscultation Heart: Regular rate and rhythm Abdomen: Soft, non-tender, non-distended. No masses or hepatomegaly noted. Normal bowel sounds. Rectal: Deferred.  Will be done at the time of colonoscopy. Musculoskeletal: Symmetrical with no gross deformities  Skin: No lesions on visible extremities Extremities: No edema  Neurological: Alert oriented x 4, grossly nonfocal Psychological:  Alert and cooperative. Normal mood and affect  ASSESSMENT AND PLAN: -Personal history of colon polyps:  Had tubular adenomas in 08/2010. -Dysphagia to solid food with history of esophageal stricture.  *Schedule for colonoscopy and EGD with possible dilation.  The risks, benefits, and alternatives were discussed with the patient and she consents to proceed.  The risks benefits and alternatives to a temporary hold of anti-coagulants/anti-platelets for the procedure were discussed with the patient she consents to proceed. Obtain clearance from Dr. Jenny Reichmann.

## 2013-10-01 ENCOUNTER — Telehealth: Payer: Self-pay | Admitting: *Deleted

## 2013-10-01 NOTE — Telephone Encounter (Signed)
I called yesterday, 10-14- and today 10-01-2013 and left a message for the patient. I advised her that Dr. Cathlean Cower answered our anticoagulation letter and said she should be off her Plavix for 5 days prior to the colonoscopy which is scheduled for 11-20-2013.  I LM for her to stop the Plavix on 11-15-2013 and resume it on 11-21-2013.  I LM for her to call me if she has any questions.

## 2013-10-23 ENCOUNTER — Other Ambulatory Visit (INDEPENDENT_AMBULATORY_CARE_PROVIDER_SITE_OTHER): Payer: Medicare Other

## 2013-10-23 ENCOUNTER — Ambulatory Visit (INDEPENDENT_AMBULATORY_CARE_PROVIDER_SITE_OTHER): Payer: Medicare Other | Admitting: Internal Medicine

## 2013-10-23 ENCOUNTER — Encounter: Payer: Self-pay | Admitting: Internal Medicine

## 2013-10-23 VITALS — BP 110/78 | HR 72 | Temp 98.0°F | Ht 62.0 in | Wt 165.5 lb

## 2013-10-23 DIAGNOSIS — E785 Hyperlipidemia, unspecified: Secondary | ICD-10-CM

## 2013-10-23 DIAGNOSIS — R7309 Other abnormal glucose: Secondary | ICD-10-CM

## 2013-10-23 DIAGNOSIS — R7302 Impaired glucose tolerance (oral): Secondary | ICD-10-CM

## 2013-10-23 DIAGNOSIS — Z0000 Encounter for general adult medical examination without abnormal findings: Secondary | ICD-10-CM

## 2013-10-23 DIAGNOSIS — Z23 Encounter for immunization: Secondary | ICD-10-CM

## 2013-10-23 LAB — LIPID PANEL
Cholesterol: 202 mg/dL — ABNORMAL HIGH (ref 0–200)
HDL: 73.1 mg/dL
Total CHOL/HDL Ratio: 3
Triglycerides: 102 mg/dL (ref 0.0–149.0)
VLDL: 20.4 mg/dL (ref 0.0–40.0)

## 2013-10-23 LAB — CBC WITH DIFFERENTIAL/PLATELET
Basophils Relative: 0.8 % (ref 0.0–3.0)
Eosinophils Relative: 1.9 % (ref 0.0–5.0)
HCT: 43.8 % (ref 36.0–46.0)
Monocytes Absolute: 0.8 10*3/uL (ref 0.1–1.0)
Neutro Abs: 4.3 10*3/uL (ref 1.4–7.7)
Neutrophils Relative %: 56.4 % (ref 43.0–77.0)

## 2013-10-23 LAB — URINALYSIS, ROUTINE W REFLEX MICROSCOPIC
Bilirubin Urine: NEGATIVE
Hgb urine dipstick: NEGATIVE
Ketones, ur: NEGATIVE
Leukocytes, UA: NEGATIVE
Nitrite: NEGATIVE
Specific Gravity, Urine: 1.015
Total Protein, Urine: NEGATIVE
Urine Glucose: NEGATIVE
Urobilinogen, UA: 0.2
pH: 6 (ref 5.0–8.0)

## 2013-10-23 LAB — BASIC METABOLIC PANEL WITH GFR
BUN: 12 mg/dL (ref 6–23)
CO2: 33 meq/L — ABNORMAL HIGH (ref 19–32)
Calcium: 9.6 mg/dL (ref 8.4–10.5)
Chloride: 100 meq/L (ref 96–112)
Creatinine, Ser: 0.6 mg/dL (ref 0.4–1.2)
GFR: 100.3 mL/min
Glucose, Bld: 95 mg/dL (ref 70–99)
Potassium: 4.1 meq/L (ref 3.5–5.1)
Sodium: 138 meq/L (ref 135–145)

## 2013-10-23 LAB — HEPATIC FUNCTION PANEL
AST: 22 U/L (ref 0–37)
Albumin: 4.2 g/dL (ref 3.5–5.2)
Alkaline Phosphatase: 63 U/L (ref 39–117)
Total Protein: 7.7 g/dL (ref 6.0–8.3)

## 2013-10-23 LAB — HEMOGLOBIN A1C: Hgb A1c MFr Bld: 5.8 % (ref 4.6–6.5)

## 2013-10-23 LAB — LDL CHOLESTEROL, DIRECT: Direct LDL: 113.2 mg/dL

## 2013-10-23 NOTE — Progress Notes (Signed)
Subjective:    Patient ID: Maria [name], female    DOB: 11/15/1941, 72 y.o.   MRN: [id number]  HPI  Here for wellness and f/u;  Overall doing ok;  Pt denies CP, worsening SOB, DOE, wheezing, orthopnea, PND, worsening LE edema, palpitations, dizziness or syncope.  Pt denies neurological change such as new headache, facial or extremity weakness.  Pt denies polydipsia, polyuria, or low sugar symptoms. Pt states overall good compliance with treatment and medications, good tolerability, and has been trying to follow lower cholesterol diet.  Pt denies worsening depressive symptoms, suicidal ideation or panic. No fever, night sweats, wt loss, loss of appetite, or other constitutional symptoms.  Pt states good ability with ADL's, has low fall risk, home safety reviewed and adequate, no other significant changes in hearing or vision, and only occasionally active with exercise. Scheduled for colonscopy and EGD dec 4 , Dr Carlean Purl. Has hx of fall earlier this yr with arm fx, no recent bone density, but she is wary of the cost and wants to check with insurance coverage first Past Medical History  Diagnosis Date  . Carpal tunnel syndrome   . Anemia   . Arthritis   . Hypertension   . OSA (obstructive sleep apnea)   . Headache(784.0)     chronic  . Diverticulosis   . GERD (gastroesophageal reflux disease)   . Palpitations   . Hyperlipidemia   . TIA (transient ischemic attack)   . Vertigo, benign paroxysmal   . Migraine   . Personal history of colonic adenomas 09/15/2010  . Nephritis    Past Surgical History  Procedure Laterality Date  . Carpal tunnel release      x 2  . Breast biopsy      rt benign cyst  . Replacement total knee bilateral    . Tonsillectomy    . Bladder repair      reports that she quit smoking about 43 years ago. Her smoking use included Cigarettes. She has a 10 pack-year smoking history. She has never used smokeless tobacco. She reports that she drinks alcohol. She reports  that she does not use illicit drugs. family history includes Aneurysm in her father; Chronic bronchitis in her mother; Colon cancer in her maternal grandmother; Colon polyps in her mother; Diabetes in her brother and father; Emphysema in her maternal grandfather; Heart disease in her mother; Hypertension in her father; Irritable bowel syndrome in her daughter; Kidney disease in her father. Allergies  Allergen Reactions  . Ace Inhibitors   . Amoxicillin Hives  . Benicar [Olmesartan Medoxomil]   . Codeine     REACTION: nausea  . Lisinopril   . Nutritional Supplements     Bee stings-carry an EPIPEN   Current Outpatient Prescriptions on File Prior to Visit  Medication Sig Dispense Refill  . albuterol (PROVENTIL HFA;VENTOLIN HFA) 108 (90 BASE) MCG/ACT inhaler Inhale 2 puffs into the lungs every 6 (six) hours as needed for wheezing or shortness of breath.  1 Inhaler  5  . amLODipine (NORVASC) 5 MG tablet TAKE 1 TABLET EVERY DAY  30 tablet  3  . b complex vitamins tablet Take 1 tablet by mouth daily.        Marland Kitchen CALCIUM PO Take by mouth daily.        . Cholecalciferol (VITAMIN D-3 PO) Take by mouth daily.        . clopidogrel (PLAVIX) 75 MG tablet TAKE 1 TABLET (75 MG TOTAL) BY MOUTH DAILY.  30 tablet  3  . EPINEPHrine (EPIPEN) 0.3 mg/0.3 mL SOAJ injection Inject 0.3 mL (0.3 mg total) into the muscle once.  2 Device  1  . hydrochlorothiazide (HYDRODIURIL) 25 MG tablet TAKE 1 TABLET (25 MG TOTAL) BY MOUTH DAILY. ONCE A DAY  30 tablet  3  . montelukast (SINGULAIR) 10 MG tablet Take 1 tablet (10 mg total) by mouth at bedtime.  90 tablet  3  . [name] Sulfate-K Sulfate-Mg Sulf SOLN Take 1 kit by mouth once.  354 mL  0  . omeprazole (PRILOSEC) 40 MG capsule Take 40 mg by mouth 2 (two) times daily.       . potassium chloride SA (KLOR-CON M20) 20 MEQ tablet TAKE 1 TABLET EVERY DAY  30 tablet  3  . simvastatin (ZOCOR) 10 MG tablet Take 1 tablet (10 mg total) by mouth at bedtime.  90 tablet  3   No current  facility-administered medications on file prior to visit.    Review of Systems Constitutional: Negative for diaphoresis, activity change, appetite change or unexpected weight change.  HENT: Negative for hearing loss, ear pain, facial swelling, mouth sores and neck stiffness.   Eyes: Negative for pain, redness and visual disturbance.  Respiratory: Negative for shortness of breath and wheezing.   Cardiovascular: Negative for chest pain and palpitations.  Gastrointestinal: Negative for diarrhea, blood in stool, abdominal distention or other pain Genitourinary: Negative for hematuria, flank pain or change in urine volume.  Musculoskeletal: Negative for myalgias and joint swelling.  Skin: Negative for color change and wound.  Neurological: Negative for syncope and numbness. other than noted Hematological: Negative for adenopathy.  Psychiatric/Behavioral: Negative for hallucinations, self-injury, decreased concentration and agitation.      Objective:   Physical Exam BP 110/78  Pulse 72  Temp(Src) 98 F (36.7 C) (Oral)  Ht _0  (1.575 m)  Wt 165 lb 8 oz (75.07 kg)  BMI 30.26 kg/m2  SpO2 96% VS noted,  Constitutional: Pt is oriented to person, place, and time. Appears well-developed and well-nourished.  Head: Normocephalic and atraumatic.  Right Ear: External ear normal.  Left Ear: External ear normal.  Nose: Nose normal.  Mouth/Throat: Oropharynx is clear and moist.  Eyes: Conjunctivae and EOM are normal. Pupils are equal, round, and reactive to light.  Neck: Normal range of motion. Neck supple. No JVD present. No tracheal deviation present.  Cardiovascular: Normal rate, regular rhythm, normal heart sounds and intact distal pulses.   Pulmonary/Chest: Effort normal and breath sounds normal.  Abdominal: Soft. Bowel sounds are normal. There is no tenderness. No HSM  Musculoskeletal: Normal range of motion. Exhibits no edema.  Lymphadenopathy:  Has no cervical adenopathy.   Neurological: Pt is alert and oriented to person, place, and time. Pt has normal reflexes. No cranial nerve deficit.  Skin: Skin is warm and dry. No rash noted.  Psychiatric:  Has  normal mood and affect. Behavior is normal.     Assessment & Plan:

## 2013-10-23 NOTE — Progress Notes (Signed)
Pre visit review using our clinic review tool, if applicable. No additional management support is needed unless otherwise documented below in the visit note. 

## 2013-10-23 NOTE — Patient Instructions (Addendum)
You had the Prevnar pneumonia shot We will work on the Tetanus shot next year Please continue all other medications as before, and refills have been done if requested. Please have the pharmacy call with any other refills you may need. Please continue your efforts at being more active, low cholesterol diet, and weight control. You are otherwise up to date with prevention measures today. Please call if you would like the Bone Density test ordered Please keep your appointments with your specialists as you have planned - the colonoscopy and EGD Dec 4  Please go to the LAB in the Basement (turn left off the elevator) for the tests to be done today You will be contacted by phone if any changes need to be made immediately.  Otherwise, you will receive a letter about your results with an explanation, but please check with MyChart first.  Please remember to sign up for My Chart if you have not done so, as this will be important to you in the future with finding out test results, communicating by private email, and scheduling acute appointments online when needed.  Please return in 1 year for your yearly visit, or sooner if needed, with Lab testing done 3-5 days before

## 2013-10-23 NOTE — Assessment & Plan Note (Signed)
Asympt, for a1c today

## 2013-10-23 NOTE — Addendum Note (Signed)
Addended by: Sharon Seller B on: 10/23/2013 10:06 AM   Modules accepted: Orders

## 2013-10-23 NOTE — Assessment & Plan Note (Signed)

## 2013-11-06 ENCOUNTER — Other Ambulatory Visit: Payer: Self-pay

## 2013-11-06 MED ORDER — POTASSIUM CHLORIDE CRYS ER 20 MEQ PO TBCR: EXTENDED_RELEASE_TABLET | ORAL | Status: DC

## 2013-11-06 MED ORDER — HYDROCHLOROTHIAZIDE 25 MG PO TABS: ORAL_TABLET | ORAL | Status: DC

## 2013-11-06 MED ORDER — AMLODIPINE BESYLATE 5 MG PO TABS: ORAL_TABLET | ORAL | Status: DC

## 2013-11-06 MED ORDER — MONTELUKAST SODIUM 10 MG PO TABS: 10.0000 mg | ORAL_TABLET | Freq: Every day | ORAL | Status: DC

## 2013-11-06 MED ORDER — CLOPIDOGREL BISULFATE 75 MG PO TABS: ORAL_TABLET | ORAL | Status: DC

## 2013-11-20 ENCOUNTER — Ambulatory Visit (AMBULATORY_SURGERY_CENTER): Payer: Medicare Other | Admitting: Internal Medicine

## 2013-11-20 ENCOUNTER — Encounter: Payer: Self-pay | Admitting: Internal Medicine

## 2013-11-20 VITALS — BP 124/68 | HR 68 | Temp 98.3°F | Resp 14 | Ht 62.0 in | Wt 161.0 lb

## 2013-11-20 DIAGNOSIS — Z860100 Personal history of colon polyps, unspecified: Secondary | ICD-10-CM

## 2013-11-20 DIAGNOSIS — R1319 Other dysphagia: Secondary | ICD-10-CM

## 2013-11-20 DIAGNOSIS — K222 Esophageal obstruction: Secondary | ICD-10-CM

## 2013-11-20 DIAGNOSIS — K449 Diaphragmatic hernia without obstruction or gangrene: Secondary | ICD-10-CM

## 2013-11-20 DIAGNOSIS — D126 Benign neoplasm of colon, unspecified: Secondary | ICD-10-CM

## 2013-11-20 DIAGNOSIS — K5730 Diverticulosis of large intestine without perforation or abscess without bleeding: Secondary | ICD-10-CM

## 2013-11-20 MED ORDER — SODIUM CHLORIDE 0.9 % IV SOLN: 500.0000 mL | INTRAVENOUS | Status: DC

## 2013-11-20 MED ORDER — PANTOPRAZOLE SODIUM 40 MG PO TBEC: 40.0000 mg | DELAYED_RELEASE_TABLET | Freq: Two times a day (BID) | ORAL | Status: DC

## 2013-11-20 NOTE — Progress Notes (Signed)
Lidocaine-40mg IV prior to Propofol InductionPropofol given over incremental dosages 

## 2013-11-20 NOTE — Patient Instructions (Addendum)
You had a recurrent stricture (narrow area) where the esophagus and stomach join and I dilated that to help the swallowing.  I found and removed 2 tiny polyps, you also have diverticulosis - common and not usually a problem.  I will let you know pathology results and when to have another routine colonoscopy by mail.  It is the time of year to have a vaccination to prevent the flu (influenza virus). Please have this done through your primary care provider or you can get this done at local pharmacies or the [facility]. It would be very helpful if you notify your primary care provider when and where you had the vaccination given by messaging them in My Chart, leaving a message or faxing the information.  I appreciate the opportunity to care for you. Gatha Mayer, MD, FACG  YOU HAD AN ENDOSCOPIC PROCEDURE TODAY AT [facility]: Refer to the procedure report that was given to you for any specific questions about what was found during the examination.  If the procedure report does not answer your questions, please call your gastroenterologist to clarify.  If you requested that your care partner not be given the details of your procedure findings, then the procedure report has been included in a sealed envelope for you to review at your convenience later.  YOU SHOULD EXPECT: Some feelings of bloating in the abdomen. Passage of more gas than usual.  Walking can help get rid of the air that was put into your GI tract during the procedure and reduce the bloating. If you had a lower endoscopy (such as a colonoscopy or flexible sigmoidoscopy) you may notice spotting of blood in your stool or on the toilet paper. If you underwent a bowel prep for your procedure, then you may not have a normal bowel movement for a few days.  DIET: See dilation diet-  Drink plenty of fluids but you should avoid alcoholic beverages for 24 hours.  ACTIVITY: Your care partner should take you home directly after  the procedure.  You should plan to take it easy, moving slowly for the rest of the day.  You can resume normal activity the day after the procedure however you should NOT DRIVE or use heavy machinery for 24 hours (because of the sedation medicines used during the test).    SYMPTOMS TO REPORT IMMEDIATELY: A gastroenterologist can be reached at any hour.  During normal business hours, 8:30 AM to 5:00 PM Monday through Friday, call [phone].  After hours and on weekends, please call the GI answering service at [phone] who will take a message and have the physician on call contact you.   Following lower endoscopy (colonoscopy or flexible sigmoidoscopy):  Excessive amounts of blood in the stool  Significant tenderness or worsening of abdominal pains  Swelling of the abdomen that is new, acute  Fever of 100F or higher  Following upper endoscopy (EGD)  Vomiting of blood or coffee ground material  New chest pain or pain under the shoulder blades  Painful or persistently difficult swallowing  New shortness of breath  Fever of 100F or higher  Black, tarry-looking stools  FOLLOW UP: If any biopsies were taken you will be contacted by phone or by letter within the next 1-3 weeks.  Call your gastroenterologist if you have not heard about the biopsies in 3 weeks.  Our staff will call the home number listed on your records the next business day following your procedure to check on  you and address any questions or concerns that you may have at that time regarding the information given to you following your procedure. This is a courtesy call and so if there is no answer at the home number and we have not heard from you through the emergency physician on call, we will assume that you have returned to your regular daily activities without incident.  SIGNATURES/CONFIDENTIALITY: You and/or your care partner have signed paperwork which will be entered into your electronic medical record.  These  signatures attest to the fact that that the information above on your After Visit Summary has been reviewed and is understood.  Full responsibility of the confidentiality of this discharge information lies with you and/or your care-partner.  Dilation diet, diverticulosis, polyps, hiatal hernia-handouts given  Resume plavix today 11/20/13.  Protonix  Repeat colonoscopy will be determined by pathology.

## 2013-11-20 NOTE — Op Note (Signed)
[place]  [address] [place], [id number]   COLONOSCOPY PROCEDURE REPORT  PATIENT: Maria, [name]  MR#: [id number] BIRTHDATE: 01/15/1941 , 72  yrs. old GENDER: Female ENDOSCOPIST: Gatha Mayer, MD, [facility] PROCEDURE DATE:  11/20/2013 PROCEDURE:   Colonoscopy with biopsy First Screening Colonoscopy - Avg.  risk and is 50 yrs.  old or older - No.  Prior Negative Screening - Now for repeat screening. N/A  History of Adenoma - Now for follow-up colonoscopy & has been > or = to 3 yrs.  Yes hx of adenoma.  Has been 3 or more years since last colonoscopy.  Polyps Removed Today? Yes. ASA CLASS:   Class II INDICATIONS:Patient's personal history of adenomatous colon polyps.  MEDICATIONS: There was residual sedation effect present from prior procedure, propofol (Diprivan) 231m IV, MAC sedation, administered by CRNA, and These medications were titrated to patient response per physician's verbal order  DESCRIPTION OF PROCEDURE:   After the risks benefits and alternatives of the procedure were thoroughly explained, informed consent was obtained.  A digital rectal exam revealed no abnormalities of the rectum.   The LB [id number] endoscope was introduced through the anus and advanced to the cecum, which was identified by both the appendix and ileocecal valve. No adverse events experienced.   The quality of the prep was Suprep adequate The instrument was then slowly withdrawn as the colon was fully examined.  COLON FINDINGS: Two diminutive sessile polyps were found at the splenic flexure and in the transverse colon.  A polypectomy was performed with cold forceps.  The resection was complete and the polyp tissue was completely retrieved.   Moderate diverticulosis was noted in the sigmoid colon.   The colon mucosa was otherwise normal.  Retroflexed views revealed no abnormalities. The time to cecum=4 minutes 18 seconds.  Withdrawal time=9 minutes 50 seconds. The  scope was withdrawn and the procedure completed. COMPLICATIONS: There were no complications.  ENDOSCOPIC IMPRESSION: 1.   Two diminutive sessile polyps were found at the splenic flexure and in the transverse colon; polypectomy was performed with cold forceps 2.   Moderate diverticulosis was noted in the sigmoid colon 3.   The colon mucosa was otherwise normal  RECOMMENDATIONS: 1.  Timing of repeat colonoscopy will be determined by pathology findings in patient w/ 3 adenomas 2011 (max 8 mm) 2.   Resume clopidogrel today.   eSigned:  CGatha Mayer MD, FP[facility]12/03/2013 2:39 PM   cc: The Patient

## 2013-11-20 NOTE — Progress Notes (Signed)
Called to room to assist during endoscopic procedure.  Patient ID and intended procedure confirmed with present staff. Received instructions for my participation in the procedure from the performing physician.

## 2013-11-20 NOTE — Progress Notes (Signed)
Patient did not experience any of the following events: a burn prior to discharge; a fall within the facility; wrong site/side/patient/procedure/implant event; or a hospital transfer or hospital admission upon discharge from the facility. (G8907)Patient did not have preoperative order for IV antibiotic SSI prophylaxis. [phone])

## 2013-11-20 NOTE — Op Note (Signed)
[place]  [address] [place], [id number]   ENDOSCOPY PROCEDURE REPORT  PATIENT: Maria [name], Maria [name]  MR#: [id number] BIRTHDATE: [birth year] , 72  yrs. old GENDER: Female ENDOSCOPIST: Gatha Mayer, MD, [facility] PROCEDURE DATE:  11/20/2013 PROCEDURE:  EGD w/ biopsy and Maloney dilation of esophagus ASA CLASS:     Class II INDICATIONS:  Dysphagia. MEDICATIONS: propofol (Diprivan) 161m IV, MAC sedation, administered by CRNA, and These medications were titrated to patient response per physician's verbal order TOPICAL ANESTHETIC: Cetacaine Spray  DESCRIPTION OF PROCEDURE: After the risks benefits and alternatives of the procedure were thoroughly explained, informed consent was obtained.  The LB [id number] was introduced through the mouth and advanced to the stomach antrum. Without limitations.  The instrument was slowly withdrawn as the mucosa was fully examined.        ESOPHAGUS: A stricture was found at the gastroesophageal junction. Multiple biopsies were performed using cold forceps to further disrupt it  [facility].  A 2 cm hiatal hernia was noted.  The remainder of the upper endoscopy exam was otherwise normal. Retroflexed views revealed a hiatal hernia.     The scope was then withdrawn from the patient and the procedure completed.  COMPLICATIONS: There were no complications. ENDOSCOPIC IMPRESSION: 1.   Stricture was found at the gastroesophageal junction; dilated 54 Fr and further disruption with multiple biopsies 2.   2 cm hiatal hernia 3.   The remainder of the upper endoscopy exam was otherwise normal  RECOMMENDATIONS: 1.  Clear liquids until 330 PM , then soft foods rest iof day. Resume prior diet tomorrow. 2.  Continue PPI 3.  Proceed with a Colonoscopy.    eSigned:  CGatha Mayer MD, FE[facility]12/03/2013 2:35 PM   CC:The Patient

## 2013-11-21 ENCOUNTER — Telehealth: Payer: Self-pay | Admitting: *Deleted

## 2013-11-21 NOTE — Telephone Encounter (Signed)
No identifier, left message, follow-up

## 2013-11-24 ENCOUNTER — Other Ambulatory Visit: Payer: Self-pay | Admitting: Internal Medicine

## 2013-11-26 ENCOUNTER — Encounter: Payer: Self-pay | Admitting: Internal Medicine

## 2013-11-26 NOTE — Progress Notes (Signed)
Quick Note:  Diminutive adenoma and benign lymphoid polyp Repeat colonoscopy around 11/2018 ______

## 2013-12-25 ENCOUNTER — Encounter: Payer: Self-pay | Admitting: Cardiology

## 2013-12-29 ENCOUNTER — Encounter: Payer: Self-pay | Admitting: Cardiology

## 2014-02-20 ENCOUNTER — Telehealth: Payer: Self-pay | Admitting: *Deleted

## 2014-02-20 NOTE — Telephone Encounter (Signed)
Phoned patient and left voicemail message with MD's response.

## 2014-02-20 NOTE — Telephone Encounter (Signed)
Very sorry, we normally dont rx antibx over the phone; please consider OV at sat clinic

## 2014-02-20 NOTE — Telephone Encounter (Signed)
Patient phoned requesting abx script for UTI.  Last OV with PCP 10/23/13.  Please advise.  CB# [phone]

## 2014-02-23 ENCOUNTER — Other Ambulatory Visit: Payer: Commercial Managed Care - HMO

## 2014-02-23 ENCOUNTER — Ambulatory Visit (INDEPENDENT_AMBULATORY_CARE_PROVIDER_SITE_OTHER): Payer: Commercial Managed Care - HMO | Admitting: Internal Medicine

## 2014-02-23 ENCOUNTER — Encounter: Payer: Self-pay | Admitting: Internal Medicine

## 2014-02-23 ENCOUNTER — Telehealth: Payer: Self-pay | Admitting: *Deleted

## 2014-02-23 VITALS — BP 140/90 | HR 85 | Temp 98.6°F | Resp 16 | Wt 174.0 lb

## 2014-02-23 DIAGNOSIS — R8290 Unspecified abnormal findings in urine: Secondary | ICD-10-CM

## 2014-02-23 DIAGNOSIS — R350 Frequency of micturition: Secondary | ICD-10-CM

## 2014-02-23 DIAGNOSIS — R82998 Other abnormal findings in urine: Secondary | ICD-10-CM

## 2014-02-23 LAB — POCT URINALYSIS DIPSTICK
Glucose, UA: NEGATIVE
KETONES UA: NEGATIVE
Nitrite, UA: POSITIVE
Spec Grav, UA: 1.01
Urobilinogen, UA: 0.2
pH, UA: 7.5

## 2014-02-23 MED ORDER — NITROFURANTOIN MONOHYD MACRO 100 MG PO CAPS: 100.0000 mg | ORAL_CAPSULE | Freq: Two times a day (BID) | ORAL | Status: DC

## 2014-02-23 NOTE — Progress Notes (Signed)
Pre visit review using our clinic review tool, if applicable. No additional management support is needed unless otherwise documented below in the visit note. 

## 2014-02-23 NOTE — Progress Notes (Signed)
   Subjective:    Patient ID: Maria [name], female    DOB: Dec 25, 1940, 73 y.o.   MRN: [id number]  HPI   Symptoms began 02/19/14 as slight dysuria described as a "twinge". This was associated with frequency and urgency as well as bilateral flank pain.  Days ago was of benefit in that it resolved the cloudiness of the urine  She's also had some slight arthralgias & myalgias.  Significantly she was hospitalized with urosepsis in August at [facility].      Review of Systems She denies fever, chills, or sweats  She has no abdominal pain, constipation, or diarrhea  There's been no associated rash or change in color or temperature of skin.     Objective:   Physical Exam General appearance is one of good health and nourishment w/o distress.  Eyes: No conjunctival inflammation or scleral icterus is present.  Oral exam: Dental hygiene is good; upper implants. Lips and gums are healthy appearing.There is no oropharyngeal erythema or exudate noted.   Heart:  Normal rate and regular rhythm. S1 and S2 normal without gallop,  click, rub or other extra sounds  .Grade 1/6 systolic murmur   Lungs:Chest clear to auscultation; no wheezes, rhonchi,rales ,or rubs present.No increased work of breathing.   Abdomen: bowel sounds normal, soft and non-tender without masses, organomegaly or hernias noted.  No guarding or rebound . Slight tenderness over the flanks to percussion  Musculoskeletal: Able to lie flat and sit up without help. Negative straight leg raising bilaterally. Gait normal  Skin:Warm & dry.  Intact without suspicious lesions or rashes ; no jaundice . Minor tenting  Lymphatic: No lymphadenopathy is noted about the head, neck, axilla areas.                Assessment & Plan:  #1dysuria & urgency See orders

## 2014-02-23 NOTE — Telephone Encounter (Signed)
Pt states md suppose to have [facility]. Pharmacy still doesn't have prescription. Per chart antibiotic was phone-in [facility]/ [address]...Maria [name]

## 2014-02-23 NOTE — Patient Instructions (Addendum)
Drink as much nondairy fluids as possible. Avoid spicy foods or alcohol as  these may aggravate the bladder. Do not take decongestants. Avoid narcotics if possible. Your next office appointment will be determined based upon review of your pending culture. Those instructions will be transmitted to you through My Chart

## 2014-02-25 LAB — URINE CULTURE

## 2014-04-08 ENCOUNTER — Other Ambulatory Visit: Payer: Self-pay | Admitting: Internal Medicine

## 2014-04-10 ENCOUNTER — Telehealth: Payer: Self-pay

## 2014-04-10 MED ORDER — BENZONATATE 200 MG PO CAPS: 200.0000 mg | ORAL_CAPSULE | Freq: Two times a day (BID) | ORAL | Status: DC | PRN

## 2014-04-10 NOTE — Telephone Encounter (Signed)
The patient called and is need of a cough medicine called in.    [facility] [address]  Pt callback - [phone]

## 2014-04-10 NOTE — Telephone Encounter (Signed)
Pt notified.

## 2014-04-10 NOTE — Telephone Encounter (Signed)
Returned pt's call. LMOVM for pt to return call to let pt know that she will need an appt.

## 2014-04-10 NOTE — Telephone Encounter (Signed)
[place] for limited tessalon perle - done erx  Needs OV for persistent or worsening cough, or fever, pain, sob

## 2014-06-02 ENCOUNTER — Other Ambulatory Visit: Payer: Self-pay

## 2014-06-02 DIAGNOSIS — Z1231 Encounter for screening mammogram for malignant neoplasm of breast: Secondary | ICD-10-CM

## 2014-06-15 ENCOUNTER — Ambulatory Visit
Admission: RE | Admit: 2014-06-15 | Discharge: 2014-06-15 | Disposition: A | Discharge status: Home / Self Care | Payer: Commercial Managed Care - HMO | Source: Ambulatory Visit

## 2014-06-15 DIAGNOSIS — Z1231 Encounter for screening mammogram for malignant neoplasm of breast: Secondary | ICD-10-CM

## 2014-07-09 ENCOUNTER — Other Ambulatory Visit: Payer: Self-pay | Admitting: Cardiology

## 2014-09-05 ENCOUNTER — Other Ambulatory Visit: Payer: Self-pay | Admitting: Cardiology

## 2014-10-03 ENCOUNTER — Other Ambulatory Visit: Payer: Self-pay | Admitting: Cardiology

## 2014-10-03 NOTE — Telephone Encounter (Signed)
Rx was sent to pharmacy electronically. 

## 2014-10-08 ENCOUNTER — Other Ambulatory Visit: Payer: Self-pay | Admitting: Cardiology

## 2014-10-08 NOTE — Telephone Encounter (Signed)
She should get that from her PCP or her pulmonologist

## 2014-10-13 ENCOUNTER — Other Ambulatory Visit: Payer: Self-pay | Admitting: Cardiology

## 2014-11-10 ENCOUNTER — Other Ambulatory Visit (INDEPENDENT_AMBULATORY_CARE_PROVIDER_SITE_OTHER): Payer: Commercial Managed Care - HMO

## 2014-11-10 ENCOUNTER — Ambulatory Visit (INDEPENDENT_AMBULATORY_CARE_PROVIDER_SITE_OTHER): Payer: Commercial Managed Care - HMO | Admitting: Internal Medicine

## 2014-11-10 ENCOUNTER — Encounter: Payer: Self-pay | Admitting: Internal Medicine

## 2014-11-10 VITALS — BP 120/62 | HR 66 | Temp 98.3°F | Ht 63.0 in | Wt 171.2 lb

## 2014-11-10 DIAGNOSIS — Z0000 Encounter for general adult medical examination without abnormal findings: Secondary | ICD-10-CM

## 2014-11-10 DIAGNOSIS — E785 Hyperlipidemia, unspecified: Secondary | ICD-10-CM

## 2014-11-10 DIAGNOSIS — R7302 Impaired glucose tolerance (oral): Secondary | ICD-10-CM

## 2014-11-10 DIAGNOSIS — Z23 Encounter for immunization: Secondary | ICD-10-CM

## 2014-11-10 LAB — HEPATIC FUNCTION PANEL
ALT: 16 U/L (ref 0–35)
AST: 22 U/L (ref 0–37)
Albumin: 4.2 g/dL (ref 3.5–5.2)
Alkaline Phosphatase: 63 U/L (ref 39–117)
BILIRUBIN TOTAL: 0.8 mg/dL (ref 0.2–1.2)
Bilirubin, Direct: 0.1 mg/dL (ref 0.0–0.3)
Total Protein: 7.2 g/dL (ref 6.0–8.3)

## 2014-11-10 LAB — CBC WITH DIFFERENTIAL/PLATELET
BASOS ABS: 0 10*3/uL (ref 0.0–0.1)
Basophils Relative: 0.5 % (ref 0.0–3.0)
Eosinophils Absolute: 0.1 10*3/uL (ref 0.0–0.7)
Eosinophils Relative: 1.7 % (ref 0.0–5.0)
HCT: 45.4 % (ref 36.0–46.0)
Hemoglobin: 14.8 g/dL (ref 12.0–15.0)
Lymphocytes Relative: 24.2 % (ref 12.0–46.0)
Lymphs Abs: 2 10*3/uL (ref 0.7–4.0)
MCHC: 32.7 g/dL (ref 30.0–36.0)
MCV: 90 fl (ref 78.0–100.0)
MONO ABS: 0.7 10*3/uL (ref 0.1–1.0)
Monocytes Relative: 8.5 % (ref 3.0–12.0)
NEUTROS PCT: 65.1 % (ref 43.0–77.0)
Neutro Abs: 5.4 10*3/uL (ref 1.4–7.7)
PLATELETS: 302 10*3/uL (ref 150.0–400.0)
RBC: 5.04 Mil/uL (ref 3.87–5.11)
RDW: 13.9 % (ref 11.5–15.5)
WBC: 8.2 10*3/uL (ref 4.0–10.5)

## 2014-11-10 LAB — LIPID PANEL
CHOL/HDL RATIO: 3
Cholesterol: 154 mg/dL (ref 0–200)
HDL: 59.6 mg/dL (ref >39.00)
LDL CALC: 80 mg/dL (ref 0–99)
NonHDL: 94.4
Triglycerides: 74 mg/dL (ref 0.0–149.0)
VLDL: 14.8 mg/dL (ref 0.0–40.0)

## 2014-11-10 LAB — BASIC METABOLIC PANEL
BUN: 12 mg/dL (ref 6–23)
CHLORIDE: 101 meq/L (ref 96–112)
CO2: 28 meq/L (ref 19–32)
Calcium: 9.5 mg/dL (ref 8.4–10.5)
Creatinine, Ser: 0.7 mg/dL (ref 0.4–1.2)
GFR: 89.89 mL/min (ref >60.00)
GLUCOSE: 91 mg/dL (ref 70–99)
POTASSIUM: 3.6 meq/L (ref 3.5–5.1)
SODIUM: 138 meq/L (ref 135–145)

## 2014-11-10 LAB — URINALYSIS, ROUTINE W REFLEX MICROSCOPIC
Bilirubin Urine: NEGATIVE
HGB URINE DIPSTICK: NEGATIVE
Ketones, ur: NEGATIVE
Leukocytes, UA: NEGATIVE
NITRITE: NEGATIVE
RBC / HPF: NONE SEEN (ref 0–?)
SPECIFIC GRAVITY, URINE: 1.01 (ref 1.000–1.030)
TOTAL PROTEIN, URINE-UPE24: NEGATIVE
Urine Glucose: NEGATIVE
Urobilinogen, UA: 0.2 (ref 0.0–1.0)
pH: 7 (ref 5.0–8.0)

## 2014-11-10 LAB — HEMOGLOBIN A1C: HEMOGLOBIN A1C: 6.2 % (ref 4.6–6.5)

## 2014-11-10 LAB — TSH: TSH: 3.65 u[IU]/mL (ref 0.35–4.50)

## 2014-11-10 MED ORDER — SIMVASTATIN 10 MG PO TABS: 10.0000 mg | ORAL_TABLET | Freq: Every day | ORAL | Status: DC

## 2014-11-10 NOTE — Progress Notes (Signed)
Subjective:    Patient ID: Maria [name], female    DOB: November 23, 1941, 73 y.o.   MRN: [id number]  HPI  Here for wellness and f/u;  Overall doing ok;  Pt denies CP, worsening SOB, DOE, wheezing, orthopnea, PND, worsening LE edema, palpitations, dizziness or syncope.  Pt denies neurological change such as new headache, facial or extremity weakness.  Pt denies polydipsia, polyuria, or low sugar symptoms. Pt states overall good compliance with treatment and medications, good tolerability, and has been trying to follow lower cholesterol diet.  Pt denies worsening depressive symptoms, suicidal ideation or panic. No fever, night sweats, wt loss, loss of appetite, or other constitutional symptoms.  Pt states good ability with ADL's, has low fall risk, home safety reviewed and adequate, no other significant changes in hearing or vision, and only occasionally active with exercise, though she does hike dialy in the summer with some wt loss.  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 Carpal tunnel syndrome   . Anemia   . Arthritis   . Hypertension   . OSA (obstructive sleep apnea)   . Headache(784.0)     chronic  . Diverticulosis   . GERD (gastroesophageal reflux disease)   . Palpitations   . Hyperlipidemia   . TIA (transient ischemic attack)   . Vertigo, benign paroxysmal   . Migraine   . Personal history of colonic adenomas 09/15/2010  . Nephritis    Past Surgical History  Procedure Laterality Date  . Carpal tunnel release      x 2  . Breast biopsy      rt benign cyst  . Replacement total knee bilateral    . Tonsillectomy    . Bladder repair    . Colonoscopy    . Upper gastrointestinal endoscopy      reports that she quit smoking about 44 years ago. Her smoking use included Cigarettes. She has a 10 pack-year smoking history. She has never used smokeless tobacco. She reports that she drinks alcohol. She reports that she does not use  illicit drugs. family history includes Aneurysm in her father; Chronic bronchitis in her mother; Colon cancer in her maternal grandmother; Colon polyps in her mother; Diabetes in her brother and father; Emphysema in her maternal grandfather; Heart disease in her mother; Hypertension in her father; Irritable bowel syndrome in her daughter; Kidney disease in her father. Allergies  Allergen Reactions  . Amoxicillin Hives  . Ace Inhibitors   . Benicar [Olmesartan Medoxomil]   . Codeine     REACTION: nausea  . Lisinopril   . Nutritional Supplements     Bee stings cause angioedema-carry an EPIPEN   Current Outpatient Prescriptions on File Prior to Visit  Medication Sig Dispense Refill  . amLODipine (NORVASC) 5 MG tablet TAKE 1 TABLET BY MOUTH EVERY DAY 30 tablet 0  . b complex vitamins tablet Take 1 tablet by mouth daily.      Marland Kitchen CALCIUM PO Take by mouth daily.      . Cholecalciferol (VITAMIN D-3 PO) Take by mouth daily.      . clopidogrel (PLAVIX) 75 MG tablet TAKE 1 TABLET (75 MG TOTAL) BY MOUTH DAILY. 30 tablet 0  . EPINEPHrine (EPIPEN) 0.3 mg/0.3 mL SOAJ injection Inject 0.3 mL (0.3 mg total) into the muscle once. 2 Device 1  . hydrochlorothiazide (HYDRODIURIL) 25 MG tablet TAKE 1 TABLET BY MOUTH EVERY DAY 30 tablet 0  . KLOR-CON  M20 20 MEQ tablet TAKE 1 TABLET EVERY DAY 30 tablet 0  . montelukast (SINGULAIR) 10 MG tablet Take 1 tablet (10 mg total) by mouth at bedtime. 90 tablet 3  . pantoprazole (PROTONIX) 40 MG tablet Take 1 tablet (40 mg total) by mouth 2 (two) times daily before a meal. Breakfast and supper 60 tablet 3  . PROAIR HFA 108 (90 BASE) MCG/ACT inhaler INHALE 2 PUFFS INTO THE LUNCH EVERY 6 HOURS AS NEEDED FOR WHEEZING OR SHORTNESS OF BREATH 8.5 each 0   No current facility-administered medications on file prior to visit.   Review of Systems Constitutional: Negative for increased diaphoresis, other activity, appetite or other siginficant weight change  HENT: Negative for  worsening hearing loss, ear pain, facial swelling, mouth sores and neck stiffness.   Eyes: Negative for other worsening pain, redness or visual disturbance.  Respiratory: Negative for shortness of breath and wheezing.   Cardiovascular: Negative for chest pain and palpitations.  Gastrointestinal: Negative for diarrhea, blood in stool, abdominal distention or other pain Genitourinary: Negative for hematuria, flank pain or change in urine volume.  Musculoskeletal: Negative for myalgias or other joint complaints.  Skin: Negative for color change and wound.  Neurological: Negative for syncope and numbness. other than noted Hematological: Negative for adenopathy. or other swelling Psychiatric/Behavioral: Negative for hallucinations, self-injury, decreased concentration or other worsening agitation.      Objective:   Physical Exam BP 120/62 mmHg  Pulse 66  Temp(Src) 98.3 F (36.8 C) (Oral)  Ht _0  (1.6 m)  Wt 171 lb 4 oz (77.678 kg)  BMI 30.34 kg/m2  SpO2 96% VS noted,  Constitutional: Pt is oriented to person, place, and time. Appears well-developed and well-nourished.  Head: Normocephalic and atraumatic.  Right Ear: External ear normal.  Left Ear: External ear normal.  Nose: Nose normal.  Mouth/Throat: Oropharynx is clear and moist.  Eyes: Conjunctivae and EOM are normal. Pupils are equal, round, and reactive to light.  Neck: Normal range of motion. Neck supple. No JVD present. No tracheal deviation present.  Cardiovascular: Normal rate, regular rhythm, normal heart sounds and intact distal pulses.   Pulmonary/Chest: Effort normal and breath sounds without rales or wheezing  Abdominal: Soft. Bowel sounds are normal. NT. No HSM  Musculoskeletal: Normal range of motion. Exhibits no edema.  Lymphadenopathy:  Has no cervical adenopathy.  Neurological: Pt is alert and oriented to person, place, and time. Pt has normal reflexes. No cranial nerve deficit. Motor grossly intact Skin: Skin  is warm and dry. No rash noted.  Psychiatric:  Has normal mood and affect. Behavior is normal.   Wt Readings from Last 3 Encounters:  11/10/14 171 lb 4 oz (77.678 kg)  02/23/14 174 lb 0.6 oz (78.944 kg)  11/20/13 161 lb (73.029 kg)       Assessment & Plan:

## 2014-11-10 NOTE — Assessment & Plan Note (Signed)

## 2014-11-10 NOTE — Patient Instructions (Signed)
Your EKG was OK today  You had the Tetanus shot today  Please continue all other medications as before, and refills have been done if requested.  Please have the pharmacy call with any other refills you may need.  Please continue your efforts at being more active, low cholesterol diet, and weight control.  You are otherwise up to date with prevention measures today.  Please keep your appointments with your specialists as you may have planned  Your lab work was drawn today  You will be contacted by phone if any changes need to be made immediately.  Otherwise, you will receive a letter about your results with an explanation, but please check with MyChart first.  Please remember to sign up for MyChart if you have not done so, as this will be important to you in the future with finding out test results, communicating by private email, and scheduling acute appointments online when needed.  Please return in 1 year for your yearly visit, or sooner if needed, with Lab testing done 3-5 days before

## 2014-11-10 NOTE — Assessment & Plan Note (Signed)
stable overall by history and exam, recent data reviewed with pt, and pt to continue medical treatment as before,  to f/u any worsening symptoms or concerns Lab Results  Component Value Date   HGBA1C 5.8 10/23/2013   F/u lab done today

## 2014-11-10 NOTE — Progress Notes (Signed)
Pre visit review using our clinic review tool, if applicable. No additional management support is needed unless otherwise documented below in the visit note.

## 2014-11-10 NOTE — Assessment & Plan Note (Signed)
Markedly elev last yr for first time with change in diet and less exercise and wt gain, f/u lab, goal ldl < 100

## 2014-11-16 ENCOUNTER — Other Ambulatory Visit: Payer: Self-pay | Admitting: Cardiology

## 2014-12-05 ENCOUNTER — Encounter: Payer: Self-pay | Admitting: Internal Medicine

## 2014-12-05 ENCOUNTER — Ambulatory Visit (INDEPENDENT_AMBULATORY_CARE_PROVIDER_SITE_OTHER): Payer: Commercial Managed Care - HMO | Admitting: Internal Medicine

## 2014-12-05 ENCOUNTER — Ambulatory Visit (HOSPITAL_COMMUNITY)
Admission: RE | Admit: 2014-12-05 | Discharge: 2014-12-05 | Disposition: A | Discharge status: Home / Self Care | Payer: Commercial Managed Care - HMO | Source: Ambulatory Visit | Attending: Internal Medicine | Admitting: Internal Medicine

## 2014-12-05 VITALS — BP 120/70 | HR 65 | Temp 98.0°F | Ht 63.0 in | Wt 173.0 lb

## 2014-12-05 DIAGNOSIS — Z87891 Personal history of nicotine dependence: Secondary | ICD-10-CM | POA: Insufficient documentation

## 2014-12-05 DIAGNOSIS — J069 Acute upper respiratory infection, unspecified: Secondary | ICD-10-CM

## 2014-12-05 DIAGNOSIS — R059 Cough, unspecified: Secondary | ICD-10-CM

## 2014-12-05 DIAGNOSIS — R05 Cough: Secondary | ICD-10-CM | POA: Diagnosis present

## 2014-12-05 DIAGNOSIS — R0989 Other specified symptoms and signs involving the circulatory and respiratory systems: Secondary | ICD-10-CM | POA: Diagnosis not present

## 2014-12-05 MED ORDER — ALBUTEROL SULFATE HFA 108 (90 BASE) MCG/ACT IN AERS: INHALATION_SPRAY | RESPIRATORY_TRACT | Status: DC

## 2014-12-05 MED ORDER — AZITHROMYCIN 250 MG PO TABS: ORAL_TABLET | ORAL | Status: DC

## 2014-12-05 NOTE — Progress Notes (Signed)
Pre visit review using our clinic review tool, if applicable. No additional management support is needed unless otherwise documented below in the visit note.

## 2014-12-05 NOTE — Patient Instructions (Signed)
Saline nasal spray - flush nose at least 2-3x/day  nasacort nasal spray - 2 sprays each nostril one time per day.  Do this in the evening.   Mucinex DM in the am and robitussin DM in the evening

## 2014-12-05 NOTE — Progress Notes (Signed)
Subjective:    Patient ID: Maria [name], female    DOB: 1941/04/14, 73 y.o.   MRN: [id number]  HPI 73 year old female with past history of atrial fib, OSA and GERD.  She comes in today as a work in with concerns regarding persistent cough and congestion.  States symptoms started approximately one week ago.  Increased cough.  States is productive.  Has to "work to get mucus up".  Increased drainage.  Some colored mucus production.  Staying hydrated.  Taking mucinex.  Increased chest congestion and some wheezing.  Using her CPAP.  Taking cough gels as well.  Her [employer] is old.  Not using.     Past Medical History  Diagnosis Date  . Carpal tunnel syndrome   . Anemia   . Arthritis   . Hypertension   . OSA (obstructive sleep apnea)   . Headache(784.0)     chronic  . Diverticulosis   . GERD (gastroesophageal reflux disease)   . Palpitations   . Hyperlipidemia   . TIA (transient ischemic attack)   . Vertigo, benign paroxysmal   . Migraine   . Personal history of colonic adenomas 09/15/2010  . Nephritis     Current Outpatient Prescriptions on File Prior to Visit  Medication Sig Dispense Refill  . amLODipine (NORVASC) 5 MG tablet TAKE 1 TABLET BY MOUTH EVERY DAY (NEEDS OFFICE VISIT) 30 tablet 0  . b complex vitamins tablet Take 1 tablet by mouth daily.      Marland Kitchen CALCIUM PO Take by mouth daily.      . Cholecalciferol (VITAMIN D-3 PO) Take by mouth daily.      . clopidogrel (PLAVIX) 75 MG tablet TAKE 1 TABLET (75 MG TOTAL) BY MOUTH DAILY. 30 tablet 0  . EPINEPHrine (EPIPEN) 0.3 mg/0.3 mL SOAJ injection Inject 0.3 mL (0.3 mg total) into the muscle once. 2 Device 1  . hydrochlorothiazide (HYDRODIURIL) 25 MG tablet TAKE 1 TABLET BY MOUTH EVERY DAY 30 tablet 0  . KLOR-CON M20 20 MEQ tablet TAKE 1 TABLET EVERY DAY 30 tablet 0  . montelukast (SINGULAIR) 10 MG tablet Take 1 tablet (10 mg total) by mouth at bedtime. 90 tablet 3  . pantoprazole (PROTONIX) 40 MG tablet Take 1 tablet (40 mg  total) by mouth 2 (two) times daily before a meal. Breakfast and supper 60 tablet 3  . simvastatin (ZOCOR) 10 MG tablet Take 1 tablet (10 mg total) by mouth daily at 6 PM. 90 tablet 3   No current facility-administered medications on file prior to visit.    Review of Systems Patient denies any headache, lightheadedness or dizziness.  Some increased drainage.   No chest pain, tightness or palpitations.  No increased shortness of breath.  Persistent increased cough and congestion.  Productive mucus as outlined.   No nausea or vomiting.  No diarrhea.  Taking mucinex and cough gels.          Objective:   Physical Exam Filed Vitals:   12/05/14 0946  BP: 120/70  Pulse: 65  Temp: 98 F (29.22 C)   73 year old female in no acute distress.   HEENT:  Nares- clear.  Oropharynx - without lesions. NECK:  Supple.  Nontender.  No audible bruit.  HEART:  Appears to be regular. LUNGS:  No crackles or wheezing audible.  Respirations even and unlabored.  Increased cough with forced expiration.          Assessment & Plan:  1. Cough Persistent  cough and congestion.  Treat infection as outlined.   - DG Chest 2 View; Future  2. URI (upper respiratory infection) Persistent/worsening symptoms.  Treat with zpak as directed.  Saline nasal spray and nasacort nasal spray as directed.  Mucinex DM in the am and robitussin DM in the evening.  Albuterol inhaler as directed.  Follow.

## 2014-12-06 ENCOUNTER — Encounter: Payer: Self-pay | Admitting: Internal Medicine

## 2014-12-06 DIAGNOSIS — J069 Acute upper respiratory infection, unspecified: Secondary | ICD-10-CM | POA: Insufficient documentation

## 2014-12-07 ENCOUNTER — Telehealth: Payer: Self-pay | Admitting: *Deleted

## 2014-12-07 NOTE — Telephone Encounter (Signed)
[place] Night - Client TELEPHONE ADVICE RECORD [facility] Call [facility] Patient Name: Maria [name] Gender: Female DOB: 1941-11-17 Age: 73 Y 29 D Return Phone Number: [phone] (Primary) Address: City/State/Zip: [place] [address] Client [facility] Night - Client Client Site Wing - Night Physician Cathlean Cower Contact Type Call Call Type Triage / Clinical Relationship To Patient Self Return Phone Number [phone] (Primary) Chief Complaint Cough Initial Comment Caller states she has been coughing for three weeks [place] See MD within 4 hrs at [place] Nurse Assessment Nurse: Venetia Maxon, RN, Manuela Schwartz Date/Time (Eastern Time): 12/05/2014 9:07:35 AM Confirm and document reason for call. If symptomatic, describe symptoms. ---Caller states she has been coughing for three weeks no thermometer. Has the patient traveled out of the country within the last 30 days? ---No Does the patient require triage? ---Yes Related visit to physician within the last 2 weeks? ---No Does the PT have any chronic conditions? (i.e. diabetes, asthma, etc.) ---Yes List chronic conditions. ---HTN prone to bronchitis COPD has inhaler : Proventyl Guidelines Guideline Title Affirmed Question Affirmed Notes Nurse Date/Time (Eastern Time) Cough - Acute Productive Wheezing is present Venetia Maxon, Therapist, sports, Manuela Schwartz 12/05/2014 9:09:28 AM Disp. Time Eilene Ghazi Time) Disposition Final User 12/05/2014 9:11:46 AM See Physician within 4 Hours (or PCP triage) Yes Venetia Maxon, RN, Edwena Bunde Understands: Yes Disagree/Comply: Comply Care Advice Given Per Guideline PLEASE NOTE: All timestamps contained within this report are represented as [place] Standard Time. CONFIDENTIALTY NOTICE: This fax transmission is intended only for the addressee. It contains information that is legally privileged, confidential or otherwise  protected from use or disclosure. If you are not the intended recipient, you are strictly prohibited from reviewing, disclosing, copying using or disseminating any of this information or taking any action in reliance on or regarding this information. If you have received this fax in error, please notify us immediately by telephone so that we can arrange for its return to [place]. Phone: [phone], Toll-Free: [phone], Fax: [phone] Page: 2 of 2 Call Id: [id number] Care Advice Given Per Guideline SEE PHYSICIAN WITHIN 4 HOURS (or PCP triage): - OTC COUGH SYRUPS: The most common cough suppressant in OTC cough medications is dextromethorphan. Often the letters 'DM' appear in the name. - OTC COUGH DROPS: Cough drops can help a lot, especially for mild coughs. They reduce coughing by soothing your irritated throat and removing that tickle sensation in the back of the throat. Cough drops also have the advantage of portability - you can carry them with you. COUGH MEDICINES: - HOME REMEDY - HARD CANDY: Hard candy works just as well as medicine-flavored OTC cough drops. Diabetics should use sugar-free candy. - HOME REMEDY - HONEY: This old home remedy has been shown to help decrease coughing at night. The adult dosage is 2 teaspoons (10 ml) at bedtime. Honey should not be given to infants under one year of age. CALL BACK IF: * You become worse. CARE ADVICE given per Cough - Acute Productive (Adult) guideline. After Care Instructions Given Call Event Type User Date / Time Description

## 2014-12-07 NOTE — Telephone Encounter (Signed)
Pt notified via phone

## 2014-12-20 ENCOUNTER — Other Ambulatory Visit: Payer: Self-pay | Admitting: Cardiology

## 2014-12-22 ENCOUNTER — Other Ambulatory Visit: Payer: Self-pay | Admitting: Cardiology

## 2014-12-23 NOTE — Telephone Encounter (Signed)
Patient requesting refill on Plavix but hasn't been seen by Dr. Mare Ferrari since 04/28/2013. Please advise.

## 2014-12-26 ENCOUNTER — Other Ambulatory Visit: Payer: Self-pay | Admitting: Internal Medicine

## 2015-01-05 ENCOUNTER — Ambulatory Visit (INDEPENDENT_AMBULATORY_CARE_PROVIDER_SITE_OTHER): Payer: Commercial Managed Care - HMO

## 2015-01-05 DIAGNOSIS — Z23 Encounter for immunization: Secondary | ICD-10-CM

## 2015-01-20 ENCOUNTER — Ambulatory Visit (INDEPENDENT_AMBULATORY_CARE_PROVIDER_SITE_OTHER): Payer: Commercial Managed Care - HMO | Admitting: Internal Medicine

## 2015-01-20 ENCOUNTER — Encounter: Payer: Self-pay | Admitting: Internal Medicine

## 2015-01-20 VITALS — BP 118/80 | HR 87 | Temp 98.5°F | Ht 63.0 in | Wt 172.5 lb

## 2015-01-20 DIAGNOSIS — R062 Wheezing: Secondary | ICD-10-CM

## 2015-01-20 DIAGNOSIS — J209 Acute bronchitis, unspecified: Secondary | ICD-10-CM | POA: Diagnosis not present

## 2015-01-20 DIAGNOSIS — R7302 Impaired glucose tolerance (oral): Secondary | ICD-10-CM

## 2015-01-20 MED ORDER — LEVOFLOXACIN 250 MG PO TABS: 250.0000 mg | ORAL_TABLET | Freq: Every day | ORAL | Status: DC

## 2015-01-20 MED ORDER — METHYLPREDNISOLONE ACETATE 80 MG/ML IJ SUSP
80.0000 mg | Freq: Once | INTRAMUSCULAR | Status: AC
  Administered 2015-01-20: 80 mg via INTRAMUSCULAR

## 2015-01-20 MED ORDER — PREDNISONE 10 MG PO TABS: ORAL_TABLET | ORAL | Status: DC

## 2015-01-20 MED ORDER — HYDROCODONE-HOMATROPINE 5-1.5 MG/5ML PO SYRP: 5.0000 mL | ORAL_SOLUTION | Freq: Four times a day (QID) | ORAL | Status: DC | PRN

## 2015-01-20 NOTE — Patient Instructions (Signed)
You had the steroid shot today  Please take all new medication as prescribed - the antibiotic, and cough medicine  Please continue all other medications as before, and refills have been done if requested.  Please have the pharmacy call with any other refills you may need.  Please continue your efforts at being more active, low cholesterol diet, and weight control.  Please keep your appointments with your specialists as you may have planned

## 2015-01-20 NOTE — Assessment & Plan Note (Signed)
stable overall by history and exam, recent data reviewed with pt, and pt to continue medical treatment as before,  to f/u any worsening symptoms or concerns Lab Results  Component Value Date   HGBA1C 6.2 11/10/2014   Pt to call for onset polys or cbg > 200 with steroid tx

## 2015-01-20 NOTE — Progress Notes (Signed)
Pre visit review using our clinic review tool, if applicable. No additional management support is needed unless otherwise documented below in the visit note.

## 2015-01-20 NOTE — Assessment & Plan Note (Signed)
Mild to mod, for depomedro lIM today, predpac asd,  to f/u any worsening symptoms or concerns

## 2015-01-20 NOTE — Progress Notes (Signed)
Subjective:    Patient ID: Maria [name], female    DOB: 1941/04/04, 74 y.o.   MRN: [id number]  HPI  Here with acute onset mild to mod 2-3 days ST, HA, general weakness and malaise, with prod cough greenish sputum, but Pt denies chest pain, increased sob or doe, wheezing, orthopnea, PND, increased LE swelling, palpitations, dizziness or syncope, except for onset mild wheezing last PM.   Pt denies polydipsia, polyuria,  Past Medical History  Diagnosis Date  . Carpal tunnel syndrome   . Anemia   . Arthritis   . Hypertension   . OSA (obstructive sleep apnea)   . Headache(784.0)     chronic  . Diverticulosis   . GERD (gastroesophageal reflux disease)   . Palpitations   . Hyperlipidemia   . TIA (transient ischemic attack)   . Vertigo, benign paroxysmal   . Migraine   . Personal history of colonic adenomas 09/15/2010  . Nephritis    Past Surgical History  Procedure Laterality Date  . Carpal tunnel release      x 2  . Breast biopsy      rt benign cyst  . Replacement total knee bilateral    . Tonsillectomy    . Bladder repair    . Colonoscopy    . Upper gastrointestinal endoscopy      reports that she quit smoking about 45 years ago. Her smoking use included Cigarettes. She has a 10 pack-year smoking history. She has never used smokeless tobacco. She reports that she drinks alcohol. She reports that she does not use illicit drugs. family history includes Aneurysm in her father; Chronic bronchitis in her mother; Colon cancer in her maternal grandmother; Colon polyps in her mother; Diabetes in her brother and father; Emphysema in her maternal grandfather; Heart disease in her mother; Hypertension in her father; Irritable bowel syndrome in her daughter; Kidney disease in her father. Allergies  Allergen Reactions  . Amoxicillin Hives  . Ace Inhibitors   . Benicar [Olmesartan Medoxomil]   . Codeine     REACTION: nausea  . Lisinopril   . Nutritional Supplements     Bee stings  cause angioedema-carry an EPIPEN   Current Outpatient Prescriptions on File Prior to Visit  Medication Sig Dispense Refill  . albuterol (PROAIR HFA) 108 (90 BASE) MCG/ACT inhaler INHALE 2 PUFFS INTO THE LUNCH EVERY 6 HOURS AS NEEDED FOR WHEEZING OR SHORTNESS OF BREATH 8.5 each 0  . amLODipine (NORVASC) 5 MG tablet TAKE 1 TABLET BY MOUTH EVERY DAY (NEEDS OFFICE VISIT) 30 tablet 3  . b complex vitamins tablet Take 1 tablet by mouth daily.      Marland Kitchen CALCIUM PO Take by mouth daily.      . Cholecalciferol (VITAMIN D-3 PO) Take by mouth daily.      . clopidogrel (PLAVIX) 75 MG tablet TAKE 1 TABLET (75 MG TOTAL) BY MOUTH DAILY. 30 tablet 3  . EPINEPHrine (EPIPEN) 0.3 mg/0.3 mL SOAJ injection Inject 0.3 mL (0.3 mg total) into the muscle once. 2 Device 1  . hydrochlorothiazide (HYDRODIURIL) 25 MG tablet TAKE 1 TABLET BY MOUTH EVERY DAY 30 tablet 3  . KLOR-CON M20 20 MEQ tablet TAKE 1 TABLET EVERY DAY 30 tablet 3  . montelukast (SINGULAIR) 10 MG tablet Take 1 tablet (10 mg total) by mouth at bedtime. 90 tablet 3  . montelukast (SINGULAIR) 10 MG tablet TAKE 1 TABLET (10 MG TOTAL) BY MOUTH AT BEDTIME. 90 tablet 3  . pantoprazole (PROTONIX) 40  MG tablet Take 1 tablet (40 mg total) by mouth 2 (two) times daily before a meal. Breakfast and supper 60 tablet 3  . simvastatin (ZOCOR) 10 MG tablet Take 1 tablet (10 mg total) by mouth daily at 6 PM. 90 tablet 3   No current facility-administered medications on file prior to visit.   Review of Systems  Constitutional: Negative for unusual diaphoresis or other sweats  HENT: Negative for ringing in ear Eyes: Negative for double vision or worsening visual disturbance.  Respiratory: Negative for choking and stridor.   Gastrointestinal: Negative for vomiting or other signifcant bowel change Genitourinary: Negative for hematuria or decreased urine volume.  Musculoskeletal: Negative for other MSK pain or swelling Skin: Negative for color change and worsening wound.    Neurological: Negative for tremors and numbness other than noted  Psychiatric/Behavioral: Negative for decreased concentration or agitation other than above  '    Objective:   Physical Exam BP 118/80 mmHg  Pulse 87  Temp(Src) 98.5 F (36.9 C) (Oral)  Ht _0  (1.6 m)  Wt 172 lb 8 oz (78.245 kg)  BMI 30.56 kg/m2  SpO2 97% VS noted, mild ill Constitutional: Pt appears well-developed, well-nourished.  HENT: Head: NCAT.  Right Ear: External ear normal.  Left Ear: External ear normal.  Eyes: . Pupils are equal, round, and reactive to light. Conjunctivae and EOM are normal Neck: Normal range of motion. Neck supple.  Cardiovascular: Normal rate and regular rhythm.   Pulmonary/Chest: Effort normal and breath sounds decreased bilat without rales but + bilat mild wheezing.  Neurological: Pt is alert. Not confused , motor grossly intact Skin: Skin is warm. No rash Psychiatric: Pt behavior is normal. No agitation.     Assessment & Plan:

## 2015-01-20 NOTE — Assessment & Plan Note (Signed)
Mild to mod, for antibx course,  to f/u any worsening symptoms or concerns

## 2015-02-05 ENCOUNTER — Other Ambulatory Visit: Payer: Self-pay | Admitting: Cardiology

## 2015-02-08 ENCOUNTER — Ambulatory Visit (INDEPENDENT_AMBULATORY_CARE_PROVIDER_SITE_OTHER): Payer: Commercial Managed Care - HMO | Admitting: Internal Medicine

## 2015-02-08 ENCOUNTER — Encounter: Payer: Self-pay | Admitting: Internal Medicine

## 2015-02-08 ENCOUNTER — Telehealth: Payer: Self-pay | Admitting: Internal Medicine

## 2015-02-08 ENCOUNTER — Ambulatory Visit: Payer: Commercial Managed Care - HMO | Admitting: Internal Medicine

## 2015-02-08 VITALS — BP 150/88 | HR 90 | Temp 98.1°F | Ht 63.0 in | Wt 174.2 lb

## 2015-02-08 DIAGNOSIS — R059 Cough, unspecified: Secondary | ICD-10-CM

## 2015-02-08 DIAGNOSIS — R05 Cough: Secondary | ICD-10-CM

## 2015-02-08 DIAGNOSIS — J069 Acute upper respiratory infection, unspecified: Secondary | ICD-10-CM | POA: Diagnosis not present

## 2015-02-08 MED ORDER — HYDROCODONE-HOMATROPINE 5-1.5 MG/5ML PO SYRP: 5.0000 mL | ORAL_SOLUTION | Freq: Four times a day (QID) | ORAL | Status: DC | PRN

## 2015-02-08 MED ORDER — DOXYCYCLINE HYCLATE 100 MG PO TABS: 100.0000 mg | ORAL_TABLET | Freq: Two times a day (BID) | ORAL | Status: DC

## 2015-02-08 NOTE — Progress Notes (Signed)
Pre visit review using our clinic review tool, if applicable. No additional management support is needed unless otherwise documented below in the visit note.

## 2015-02-08 NOTE — Patient Instructions (Signed)
Plain Mucinex (NOT D) for thick secretions ;force NON dairy fluids .   Nasal cleansing in the shower as discussed with lather of mild shampoo.After 10 seconds wash off lather while  exhaling through nostrils. Make sure that all residual soap is removed to prevent irritation.  Flonase OR Nasacort AQ 1 spray in each nostril twice a day as needed. Use the "crossover" technique into opposite nostril spraying toward opposite ear @ 45 degree angle, not straight up into nostril.  Plain Allegra (NOT D )  160 daily , Loratidine 10 mg , OR Zyrtec 10 mg @ bedtime  as needed for itchy eyes & sneezing.

## 2015-02-08 NOTE — Progress Notes (Signed)
Subjective:    Patient ID: Maria [name], female    DOB: 1941-08-25, 74 y.o.   MRN: [id number]  HPI  She has had 2 courses of antibiotics including Z-Pak and Levaquin ;but she continues to have upper respiratory tract symptoms and severe cough. The cough is associated with pain in the left frontal area.  In addition to the antibiotics she has taken short course of prednisone as well as narcotic cough syrup.  She smoked for 10 years but quit in 1971. She smoked less than a pack a day. She has no definite history of asthma; but the Problem List includes chronic cough & wheezing. Also she has GERD and has had an esophageal stricture..  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  At this time she does have green nasal discharge and ongoing head congestion.  The wheezing has resolved.  Review of Systems She denies facial pain, otic pain, otic discharge.  She has no extrinsic symptoms of itchy, watery eyes, sneezing to any significant extent.  She had some intestinal symptoms last week and is on a probiotic for loose stool.    Objective:   Physical Exam  Positive or pertinent findings include: She has slight hyponasal speech. She has a small amount of wax in the right canal. Nares are markedly dry. She has upper dental implants. There's a grade 7/5-9 systolic murmur at the base. She has paroxysmal nonproductive cough.  General appearance:Adequately nourished; no acute distress or increased work of breathing is present.  No  lymphadenopathy about the head, neck, or axilla noted.  Eyes: No conjunctival inflammation or lid edema is present. There is no scleral icterus. Ears:  External ear exam shows no significant lesions or deformities.  Otoscopic examination reveals clear canals, tympanic membranes are intact bilaterally without bulging, retraction, inflammation or discharge. Nose:  External nasal examination shows no deformity or inflammation. No  septal dislocation or deviation.No obstruction to airflow.  Oral exam: lips and gums are healthy appearing.There is no oropharyngeal erythema or exudate noted.  Neck:  No deformities, thyromegaly, masses, or tenderness noted.   Supple with full range of motion without pain.  Heart:  Normal rate and regular rhythm. S1 and S2 normal without gallop, click, rub or other extra sounds.  Lungs:Chest clear to auscultation; no wheezes, rhonchi,rales ,or rubs present. Extremities:  No cyanosis, edema, or clubbing  noted  Skin: Warm & dry w/o jaundice or tenting.      Assessment & Plan:  #1 upper history tract infection ; probably related to sick grandchildren  #2 cough secondary to post nasal drainage  Plan: See orders. If symptoms persist she does need a chest x-ray and CT of the sinuses. If those are negative ; she should return to Pulmonary

## 2015-03-19 ENCOUNTER — Encounter: Payer: Self-pay | Admitting: Internal Medicine

## 2015-03-19 ENCOUNTER — Ambulatory Visit (INDEPENDENT_AMBULATORY_CARE_PROVIDER_SITE_OTHER): Payer: Commercial Managed Care - HMO | Admitting: Internal Medicine

## 2015-03-19 ENCOUNTER — Telehealth: Payer: Self-pay | Admitting: Internal Medicine

## 2015-03-19 VITALS — BP 138/86 | HR 75 | Temp 98.2°F | Resp 18 | Ht 63.0 in | Wt 173.0 lb

## 2015-03-19 DIAGNOSIS — I10 Essential (primary) hypertension: Secondary | ICD-10-CM | POA: Insufficient documentation

## 2015-03-19 DIAGNOSIS — R05 Cough: Secondary | ICD-10-CM

## 2015-03-19 DIAGNOSIS — R059 Cough, unspecified: Secondary | ICD-10-CM

## 2015-03-19 DIAGNOSIS — Z0189 Encounter for other specified special examinations: Secondary | ICD-10-CM | POA: Diagnosis not present

## 2015-03-19 DIAGNOSIS — J309 Allergic rhinitis, unspecified: Secondary | ICD-10-CM | POA: Diagnosis not present

## 2015-03-19 DIAGNOSIS — Z0000 Encounter for general adult medical examination without abnormal findings: Secondary | ICD-10-CM

## 2015-03-19 MED ORDER — TRIAMCINOLONE ACETONIDE 55 MCG/ACT NA AERO: 2.0000 | INHALATION_SPRAY | Freq: Every day | NASAL | Status: DC

## 2015-03-19 MED ORDER — LEVOCETIRIZINE DIHYDROCHLORIDE 5 MG PO TABS: 5.0000 mg | ORAL_TABLET | Freq: Every evening | ORAL | Status: DC

## 2015-03-19 MED ORDER — AMLODIPINE BESYLATE 10 MG PO TABS: 10.0000 mg | ORAL_TABLET | Freq: Every day | ORAL | Status: DC

## 2015-03-19 NOTE — Progress Notes (Signed)
Subjective:    Patient ID: Maria [name], female    DOB: 1941-10-25, 74 y.o.   MRN: [id number]  HPI  Here to f/u; overall doing ok,  Pt denies chest pain, increasing sob or doe, wheezing, orthopnea, PND, increased LE swelling, palpitations, dizziness or syncope.  Pt denies new neurological symptoms such as new headache, or facial or extremity weakness or numbness.  Pt denies polydipsia, polyuria, or low sugar episode.   Pt denies new neurological symptoms such as new headache, or facial or extremity weakness or numbness.   Pt states overall good compliance with meds, mostly trying to follow appropriate diet, with wt overall stable,  but little exercise however.  BP has been mild elev with BP 156 sbp, up and down, more up than down per pt, higheset was 173/107 yesterday. Does have several wks ongoing nasal allergy symptoms with clearish congestion, itch and sneezing, without fever, pain, ST, cough, swelling or wheezing. Wt Readings from Last 3 Encounters:  03/19/15 173 lb (78.472 kg)  02/08/15 174 lb 4 oz (79.039 kg)  01/20/15 172 lb 8 oz (78.245 kg)   Past Medical History  Diagnosis Date  . Carpal tunnel syndrome   . Anemia   . Arthritis   . Hypertension   . OSA (obstructive sleep apnea)   . Headache(784.0)     chronic  . Diverticulosis   . GERD (gastroesophageal reflux disease)   . Palpitations   . Hyperlipidemia   . TIA (transient ischemic attack)   . Vertigo, benign paroxysmal   . Migraine   . Personal history of colonic adenomas 09/15/2010  . Nephritis    Past Surgical History  Procedure Laterality Date  . Carpal tunnel release      x 2  . Breast biopsy      rt benign cyst  . Replacement total knee bilateral    . Tonsillectomy    . Bladder repair    . Colonoscopy    . Upper gastrointestinal endoscopy      reports that she quit smoking about 45 years ago. Her smoking use included Cigarettes. She has a 10 pack-year smoking history. She has never used smokeless tobacco.  She reports that she drinks alcohol. She reports that she does not use illicit drugs. family history includes Aneurysm in her father; Chronic bronchitis in her mother; Colon cancer in her maternal grandmother; Colon polyps in her mother; Diabetes in her brother and father; Emphysema in her maternal grandfather; Heart disease in her mother; Hypertension in her father; Irritable bowel syndrome in her daughter; Kidney disease in her father. Allergies  Allergen Reactions  . Amoxicillin Hives  . Ace Inhibitors   . Benicar [Olmesartan Medoxomil]   . Codeine     REACTION: nausea  . Lisinopril   . Nutritional Supplements     Bee stings cause angioedema-carry an EPIPEN   Current Outpatient Prescriptions on File Prior to Visit  Medication Sig Dispense Refill  . albuterol (PROAIR HFA) 108 (90 BASE) MCG/ACT inhaler INHALE 2 PUFFS INTO THE LUNCH EVERY 6 HOURS AS NEEDED FOR WHEEZING OR SHORTNESS OF BREATH 8.5 each 0  . amLODipine (NORVASC) 5 MG tablet TAKE 1 TABLET BY MOUTH EVERY DAY (NEEDS OFFICE VISIT) 30 tablet 3  . b complex vitamins tablet Take 1 tablet by mouth daily.      Marland Kitchen CALCIUM PO Take by mouth daily.      . Cholecalciferol (VITAMIN D-3 PO) Take by mouth daily.      . clopidogrel (  PLAVIX) 75 MG tablet TAKE 1 TABLET (75 MG TOTAL) BY MOUTH DAILY. 30 tablet 3  . doxycycline (VIBRA-TABS) 100 MG tablet Take 1 tablet (100 mg total) by mouth 2 (two) times daily. 20 tablet 0  . EPINEPHrine (EPIPEN) 0.3 mg/0.3 mL SOAJ injection Inject 0.3 mL (0.3 mg total) into the muscle once. 2 Device 1  . hydrochlorothiazide (HYDRODIURIL) 25 MG tablet TAKE 1 TABLET BY MOUTH EVERY DAY 30 tablet 3  . HYDROcodone-homatropine (HYDROMET) 5-1.5 MG/5ML syrup Take 5 mLs by mouth every 6 (six) hours as needed for cough. 120 mL 0  . KLOR-CON M20 20 MEQ tablet TAKE 1 TABLET EVERY DAY 30 tablet 3  . montelukast (SINGULAIR) 10 MG tablet TAKE 1 TABLET (10 MG TOTAL) BY MOUTH AT BEDTIME. 90 tablet 3  . pantoprazole (PROTONIX) 40  MG tablet Take 1 tablet (40 mg total) by mouth 2 (two) times daily before a meal. Breakfast and supper 60 tablet 3  . simvastatin (ZOCOR) 10 MG tablet Take 1 tablet (10 mg total) by mouth daily at 6 PM. 90 tablet 3   No current facility-administered medications on file prior to visit.   Review of Systems  Constitutional: Negative for unusual diaphoresis or night sweats HENT: Negative for ringing in ear or discharge Eyes: Negative for double vision or worsening visual disturbance.  Respiratory: Negative for choking and stridor.   Gastrointestinal: Negative for vomiting or other signifcant bowel change Genitourinary: Negative for hematuria or change in urine volume.  Musculoskeletal: Negative for other MSK pain or swelling Skin: Negative for color change and worsening wound.  Neurological: Negative for tremors and numbness other than noted  Psychiatric/Behavioral: Negative for decreased concentration or agitation other than above       Objective:   Physical Exam BP 138/86 mmHg  Pulse 75  Temp(Src) 98.2 F (36.8 C) (Oral)  Resp 18  Ht _0  (1.6 m)  Wt 173 lb (78.472 kg)  BMI 30.65 kg/m2  SpO2 93% VS noted,  Constitutional: Pt appears in no significant distress HENT: Head: NCAT.  Right Ear: External ear normal.  Left Ear: External ear normal.  Bilat tm's with mild erythema.  Max sinus areas non tender.  Pharynx with mild erythema, no exudate Eyes: . Pupils are equal, round, and reactive to light. Conjunctivae and EOM are normal Neck: Normal range of motion. Neck supple.  Cardiovascular: Normal rate and regular rhythm.   Pulmonary/Chest: Effort normal and breath sounds without rales or wheezing.  Neurological: Pt is alert. Not confused , motor grossly intact Skin: Skin is warm. No rash, no LE edema Psychiatric: Pt behavior is normal. No agitation.     Assessment & Plan:

## 2015-03-19 NOTE — Assessment & Plan Note (Addendum)
ECG reviewed as per emr, mild uncontrolled, for increased amlod to 10 qd, to f/u any worsening symptoms or concerns

## 2015-03-19 NOTE — Patient Instructions (Addendum)
Ok to increase the amlodipine to 10 mg per day  Please take all new medication as prescribed - the levocetirizine (anit-hist) and nasacort as directed  Please continue all other medications as before, and refills have been done if requested.  Please have the pharmacy call with any other refills you may need.  Please keep your appointments with your specialists as you may have planned  Please return in 6 months, or sooner if needed, with Lab testing done 3-5 days before

## 2015-03-19 NOTE — Telephone Encounter (Signed)
emmi mailed  °

## 2015-03-19 NOTE — Progress Notes (Signed)
Pre visit review using our clinic review tool, if applicable. No additional management support is needed unless otherwise documented below in the visit note.

## 2015-03-20 NOTE — Assessment & Plan Note (Signed)
Mild to mod, for antihist/nasacort asd,  to f/u any worsening symptoms or concerns

## 2015-03-20 NOTE — Assessment & Plan Note (Signed)
Ongoing, frustrating, followed by pulm, ? This time related to post nasal gtt -for tx as above

## 2015-04-12 ENCOUNTER — Encounter: Payer: Self-pay | Admitting: Physician Assistant

## 2015-04-12 ENCOUNTER — Ambulatory Visit (INDEPENDENT_AMBULATORY_CARE_PROVIDER_SITE_OTHER): Payer: Commercial Managed Care - HMO | Admitting: Physician Assistant

## 2015-04-12 VITALS — BP 130/82 | HR 76 | Ht 63.0 in | Wt 174.8 lb

## 2015-04-12 DIAGNOSIS — R05 Cough: Secondary | ICD-10-CM

## 2015-04-12 DIAGNOSIS — E785 Hyperlipidemia, unspecified: Secondary | ICD-10-CM | POA: Diagnosis not present

## 2015-04-12 DIAGNOSIS — G459 Transient cerebral ischemic attack, unspecified: Secondary | ICD-10-CM | POA: Diagnosis not present

## 2015-04-12 DIAGNOSIS — R059 Cough, unspecified: Secondary | ICD-10-CM

## 2015-04-12 DIAGNOSIS — I10 Essential (primary) hypertension: Secondary | ICD-10-CM | POA: Diagnosis not present

## 2015-04-12 DIAGNOSIS — R053 Chronic cough: Secondary | ICD-10-CM

## 2015-04-12 NOTE — Assessment & Plan Note (Signed)
The profile reviewed from 10/2014 and was stable. Continue Zocor.

## 2015-04-12 NOTE — Assessment & Plan Note (Signed)
Patient has fine crackles at both bases and continues to have chronic cough. Recommend follow-up with Dr. Melvyn Novas.

## 2015-04-12 NOTE — Assessment & Plan Note (Signed)
Continue aspirin and Plavix. Recommend carotid Doppler for right bruit. Patient has scheduled screening with  life watch. Dopplers will be performed next month. I've asked the patient to send [place] a copy.

## 2015-04-12 NOTE — Assessment & Plan Note (Signed)
Blood pressure is better controlled on the increase amlodipine. She does complain of mild ankle swelling but none is present today. She has gained weight and is not exercising as much. Recommend weight loss program with [employer] or through [facility]. Also recommend 2 g sodium diet. Follow-up with Dr. Mare Ferrari in 6 months.

## 2015-04-12 NOTE — Patient Instructions (Addendum)
Medication Instructions:  Your physician recommends that you continue on your current medications as directed. Please refer to the Current Medication list given to you today.   Labwork: None at this time  Testing/Procedures: NEED A REFERRAL TO  DR [facility]    Follow-Up: Your physician wants you to follow-up in: with Dr Mare Ferrari IN 6 MONTHS  You will receive a reminder letter in the mail two months in advance. If you don't receive a letter, please call our office to schedule the follow-up appointment.'  Any Other Special Instructions Will Be Listed Below (If Applicable).  Low-Sodium Eating Plan Sodium raises blood pressure and causes water to be held in the body. Getting less sodium from food will help lower your blood pressure, reduce any swelling, and protect your heart, liver, and kidneys. We get sodium by adding salt (sodium chloride) to food. Most of our sodium comes from canned, boxed, and frozen foods. Restaurant foods, fast foods, and pizza are also very high in sodium. Even if you take medicine to lower your blood pressure or to reduce fluid in your body, getting less sodium from your food is important. WHAT IS MY PLAN? Most people should limit their sodium intake to 2,300 mg a day. Your health care provider recommends that you limit your sodium intake to __________ a day.  WHAT DO I NEED TO KNOW ABOUT THIS EATING PLAN? For the low-sodium eating plan, you will follow these general guidelines:  Choose foods with a % Daily Value for sodium of less than 5% (as listed on the food label).   Use salt-free seasonings or herbs instead of table salt or sea salt.   Check with your health care provider or pharmacist before using salt substitutes.   Eat fresh foods.  Eat more vegetables and fruits.  Limit canned vegetables. If you do use them, rinse them well to decrease the sodium.   Limit cheese to 1 oz (28 g) per day.   Eat lower-sodium products, often labeled as  "lower sodium" or "no salt added."  Avoid foods that contain monosodium glutamate (MSG). MSG is sometimes added to [place] food and some canned foods.  Check food labels (Nutrition Facts labels) on foods to learn how much sodium is in one serving.  Eat more home-cooked food and less restaurant, buffet, and fast food.  When eating at a restaurant, ask that your food be prepared with less salt or none, if possible.  HOW DO I READ FOOD LABELS FOR SODIUM INFORMATION? The Nutrition Facts label lists the amount of sodium in one serving of the food. If you eat more than one serving, you must multiply the listed amount of sodium by the number of servings. Food labels may also identify foods as:  Sodium free--Less than 5 mg in a serving.  Very low sodium--35 mg or less in a serving.  Low sodium--140 mg or less in a serving.  Light in sodium--50% less sodium in a serving. For example, if a food that usually has 300 mg of sodium is changed to become light in sodium, it will have 150 mg of sodium.  Reduced sodium--25% less sodium in a serving. For example, if a food that usually has 400 mg of sodium is changed to reduced sodium, it will have 300 mg of sodium. WHAT FOODS CAN I EAT? Grains Low-sodium cereals, including oats, puffed wheat and rice, and shredded wheat cereals. Low-sodium crackers. Unsalted rice and pasta. Lower-sodium bread.  Vegetables Frozen or fresh vegetables. Low-sodium or reduced-sodium  canned vegetables. Low-sodium or reduced-sodium tomato sauce and paste. Low-sodium or reduced-sodium tomato and vegetable juices.  Fruits Fresh, frozen, and canned fruit. Fruit juice.  Meat and Other Protein Products Low-sodium canned tuna and salmon. Fresh or frozen meat, poultry, seafood, and fish. Lamb. Unsalted nuts. Dried beans, peas, and lentils without added salt. Unsalted canned beans. Homemade soups without salt. Eggs.  Dairy Milk. Soy milk. Ricotta cheese. Low-sodium or  reduced-sodium cheeses. Yogurt.  Condiments Fresh and dried herbs and spices. Salt-free seasonings. Onion and garlic powders. Low-sodium varieties of mustard and ketchup. Lemon juice.  Fats and Oils Reduced-sodium salad dressings. Unsalted butter.  Other Unsalted popcorn and pretzels.  The items listed above may not be a complete list of recommended foods or beverages. Contact your dietitian for more options. WHAT FOODS ARE NOT RECOMMENDED? Grains Instant hot cereals. Bread stuffing, pancake, and biscuit mixes. Croutons. Seasoned rice or pasta mixes. Noodle soup cups. Boxed or frozen macaroni and cheese. Self-rising flour. Regular salted crackers. Vegetables Regular canned vegetables. Regular canned tomato sauce and paste. Regular tomato and vegetable juices. Frozen vegetables in sauces. Salted french fries. Olives. Angie Fava. Relishes. Sauerkraut. Salsa. Meat and Other Protein Products Salted, canned, smoked, spiced, or pickled meats, seafood, or fish. Bacon, ham, sausage, hot dogs, corned beef, chipped beef, and packaged luncheon meats. Salt pork. Jerky. Pickled herring. Anchovies, regular canned tuna, and sardines. Salted nuts. Dairy Processed cheese and cheese spreads. Cheese curds. Blue cheese and cottage cheese. Buttermilk.  Condiments Onion and garlic salt, seasoned salt, table salt, and sea salt. Canned and packaged gravies. Worcestershire sauce. Tartar sauce. Barbecue sauce. Teriyaki sauce. Soy sauce, including reduced sodium. Steak sauce. Fish sauce. Oyster sauce. Cocktail sauce. Horseradish. Regular ketchup and mustard. Meat flavorings and tenderizers. Bouillon cubes. Hot sauce. Tabasco sauce. Marinades. Taco seasonings. Relishes. Fats and Oils Regular salad dressings. Salted butter. Margarine. Ghee. Bacon fat.  Other Potato and tortilla chips. Corn chips and puffs. Salted popcorn and pretzels. Canned or dried soups. Pizza. Frozen entrees and pot pies.  The items listed  above may not be a complete list of foods and beverages to avoid. Contact your dietitian for more information. Document Released: 05/26/2002 Document Revised: 12/09/2013 Document Reviewed: 10/08/2013 [facility] Patient Information 2015 [place], [place]. This information is not intended to replace advice given to you by your health care provider. Make sure you discuss any questions you have with your health care provider.

## 2015-04-12 NOTE — Progress Notes (Signed)
Cardiology Office Note   Date:  04/12/2015   ID:  Maria [name], DOB 09-Aug-1941, MRN [id number]  PCP:  Cathlean Cower, MD  Cardiologist:  Darlin Coco M.D.  Chief Complaint: High blood pressure    History of Present Illness: Maria [name] is a 74 y.o. female who presents for follow-up of elevated blood pressure. She has history of suspected TIA 01/2011 treated with aspirin and Plavix. She also has history of palpitations with no history of documented atrial fibrillation. She had a normal stress thallium in 2012 showing no ischemia EF 65%. 2-D echo in 2011 showed mild aortic sclerosis with trivial AI and normal LV systolic function and diastolic function. She has a history of suspected COPD.  He saw Dr. Cathlean Cower on 03/19/15 for elevated blood pressure and her amlodipine was increased to 10 mg daily. He also gave her antihistamine and Nasacort for allergic rhinitis.  Patient states that her blood pressure has gradually come down since the increase in amlodipine. She has noticed increased ankle swelling. She admits to cravings salt and gaining weight. She tries to exercise but is taking care of her 3 grandchildren often and doesn't get the regular exercise she needs to. She is also bothered by her chronic cough and has been on multiple antibiotics and steroids throughout the winter. She has not seen Dr. Melvyn Novas since 2014.  Past Medical History  Diagnosis Date  . Carpal tunnel syndrome   . Anemia   . Arthritis   . Hypertension   . OSA (obstructive sleep apnea)   . Headache(784.0)     chronic  . Diverticulosis   . GERD (gastroesophageal reflux disease)   . Palpitations   . Hyperlipidemia   . TIA (transient ischemic attack)   . Vertigo, benign paroxysmal   . Migraine   . Personal history of colonic adenomas 09/15/2010  . Nephritis     Past Surgical History  Procedure Laterality Date  . Carpal tunnel release      x 2  . Breast biopsy      rt benign cyst  . Replacement total  knee bilateral    . Tonsillectomy    . Bladder repair    . Colonoscopy    . Upper gastrointestinal endoscopy       Current Outpatient Prescriptions  Medication Sig Dispense Refill  . albuterol (PROAIR HFA) 108 (90 BASE) MCG/ACT inhaler INHALE 2 PUFFS INTO THE LUNCH EVERY 6 HOURS AS NEEDED FOR WHEEZING OR SHORTNESS OF BREATH 8.5 each 0  . amLODipine (NORVASC) 10 MG tablet Take 1 tablet (10 mg total) by mouth daily. 90 tablet 3  . b complex vitamins tablet Take 1 tablet by mouth daily.      Marland Kitchen CALCIUM PO Take by mouth daily.      . Cholecalciferol (VITAMIN D-3 PO) Take by mouth daily.      . clopidogrel (PLAVIX) 75 MG tablet TAKE 1 TABLET (75 MG TOTAL) BY MOUTH DAILY. 30 tablet 3  . doxycycline (VIBRA-TABS) 100 MG tablet Take 1 tablet (100 mg total) by mouth 2 (two) times daily. 20 tablet 0  . EPINEPHrine (EPIPEN) 0.3 mg/0.3 mL SOAJ injection Inject 0.3 mL (0.3 mg total) into the muscle once. 2 Device 1  . hydrochlorothiazide (HYDRODIURIL) 25 MG tablet TAKE 1 TABLET BY MOUTH EVERY DAY 30 tablet 3  . HYDROcodone-homatropine (HYDROMET) 5-1.5 MG/5ML syrup Take 5 mLs by mouth every 6 (six) hours as needed for cough. 120 mL 0  . KLOR-CON M20 20 MEQ  tablet TAKE 1 TABLET EVERY DAY 30 tablet 3  . levocetirizine (XYZAL) 5 MG tablet Take 1 tablet (5 mg total) by mouth every evening. 30 tablet 11  . montelukast (SINGULAIR) 10 MG tablet TAKE 1 TABLET (10 MG TOTAL) BY MOUTH AT BEDTIME. 90 tablet 3  . pantoprazole (PROTONIX) 40 MG tablet Take 1 tablet (40 mg total) by mouth 2 (two) times daily before a meal. Breakfast and supper 60 tablet 3  . simvastatin (ZOCOR) 10 MG tablet Take 1 tablet (10 mg total) by mouth daily at 6 PM. 90 tablet 3  . triamcinolone (NASACORT AQ) 55 MCG/ACT AERO nasal inhaler Place 2 sprays into the nose daily. 1 Inhaler 12   No current facility-administered medications for this visit.    Allergies:   Amoxicillin; Ace inhibitors; Benicar; Codeine; Lisinopril; and Nutritional  supplements    Social History:  The patient  reports that she quit smoking about 45 years ago. Her smoking use included Cigarettes. She has a 10 pack-year smoking history. She has never used smokeless tobacco. She reports that she drinks alcohol. She reports that she does not use illicit drugs.   Family History:  The patient's family history includes Aneurysm in her father; Chronic bronchitis in her mother; Colon cancer in her maternal grandmother; Colon polyps in her mother; Diabetes in her brother and father; Emphysema in her maternal grandfather; Heart disease in her mother; Hypertension in her father; Irritable bowel syndrome in her daughter; Kidney disease in her father.    ROS:  Please see the history of present illness.   Otherwise, review of systems are positive for urinary frequency and incontinence, insomnia, fatigue.   All other systems are reviewed and negative.    PHYSICAL EXAM: VS:  Ht _0  (1.6 m)  Wt 174 lb 12.8 oz (79.289 kg)  BMI 30.97 kg/m2 , BMI Body mass index is 30.97 kg/(m^2). GEN: Well nourished, well developed, in no acute distress Neck: Right Carotid bruit, no JVD, HJR,  or masses Cardiac: RRR; no murmurs,gallop, rubs, thrill or heave,  Respiratory:  Decreased breath sounds with fine crackles at both bases left greater than right  GI: soft, nontender, nondistended, + BS MS: no deformity or atrophy Extremities: without cyanosis, clubbing, edema, good distal pulses bilaterally.  Skin: warm and dry, no rash Neuro:  Strength and sensation are intact    EKG:  EKG is not ordered today. The ekg reviewed from 03/19/15 demonstrates normal sinus rhythm without acute change   Recent Labs: 11/10/2014: ALT 16; BUN 12; Creatinine 0.7; Hemoglobin 14.8; Platelets 302.0; Potassium 3.6; Sodium 138; TSH 3.65    Lipid Panel    Component Value Date/Time   CHOL 154 11/10/2014 0811   TRIG 74.0 11/10/2014 0811   HDL 59.60 11/10/2014 0811   CHOLHDL 3 11/10/2014 0811   VLDL  14.8 11/10/2014 0811   LDLCALC 80 11/10/2014 0811   LDLDIRECT 113.2 10/23/2013 0945      Wt Readings from Last 3 Encounters:  04/12/15 174 lb 12.8 oz (79.289 kg)  03/19/15 173 lb (78.472 kg)  02/08/15 174 lb 4 oz (79.039 kg)         ASSESSMENT AND PLAN: Essential hypertension Blood pressure is better controlled on the increase amlodipine. She does complain of mild ankle swelling but none is present today. She has gained weight and is not exercising as much. Recommend weight loss program with Weight [employer] or through [facility] Also recommend 2 g sodium diet. Follow-up with Dr. Mare Ferrari in 6 months.  TIA (transient ischemic attack) Continue aspirin and Plavix. Recommend carotid Doppler for right bruit. Patient has scheduled screening with  life watch. Dopplers will be performed next month. I've asked the patient to send [place] a copy.   chronic cough ? etiology  Patient has fine crackles at both bases and continues to have chronic cough. Recommend follow-up with Dr. Melvyn Novas.   Hyperlipidemia The profile reviewed from 10/2014 and was stable. Continue Zocor.      Signed, Ermalinda Barrios, PA-C  04/12/2015 8:21 AM    [facility] [place], [place], [place]  [address] Phone: [phone]; Fax: [phone]

## 2015-04-27 ENCOUNTER — Ambulatory Visit (INDEPENDENT_AMBULATORY_CARE_PROVIDER_SITE_OTHER): Payer: Commercial Managed Care - HMO | Admitting: Internal Medicine

## 2015-04-27 ENCOUNTER — Other Ambulatory Visit (INDEPENDENT_AMBULATORY_CARE_PROVIDER_SITE_OTHER): Payer: Commercial Managed Care - HMO

## 2015-04-27 ENCOUNTER — Encounter: Payer: Self-pay | Admitting: Internal Medicine

## 2015-04-27 VITALS — BP 130/80 | HR 64 | Ht 63.0 in | Wt 177.0 lb

## 2015-04-27 DIAGNOSIS — R05 Cough: Secondary | ICD-10-CM

## 2015-04-27 DIAGNOSIS — J8410 Pulmonary fibrosis, unspecified: Secondary | ICD-10-CM | POA: Insufficient documentation

## 2015-04-27 DIAGNOSIS — R0600 Dyspnea, unspecified: Secondary | ICD-10-CM | POA: Diagnosis not present

## 2015-04-27 DIAGNOSIS — R059 Cough, unspecified: Secondary | ICD-10-CM

## 2015-04-27 LAB — CBC WITH DIFFERENTIAL/PLATELET
Basophils Absolute: 0.1 10*3/uL (ref 0.0–0.1)
Basophils Relative: 0.6 % (ref 0.0–3.0)
EOS PCT: 1.6 % (ref 0.0–5.0)
Eosinophils Absolute: 0.1 10*3/uL (ref 0.0–0.7)
HCT: 41 % (ref 36.0–46.0)
Hemoglobin: 13.9 g/dL (ref 12.0–15.0)
Lymphocytes Relative: 26.9 % (ref 12.0–46.0)
Lymphs Abs: 2.3 10*3/uL (ref 0.7–4.0)
MCHC: 33.9 g/dL (ref 30.0–36.0)
MCV: 86.1 fl (ref 78.0–100.0)
MONO ABS: 0.7 10*3/uL (ref 0.1–1.0)
MONOS PCT: 8.4 % (ref 3.0–12.0)
NEUTROS PCT: 62.5 % (ref 43.0–77.0)
Neutro Abs: 5.4 10*3/uL (ref 1.4–7.7)
PLATELETS: 308 10*3/uL (ref 150.0–400.0)
RBC: 4.76 Mil/uL (ref 3.87–5.11)
RDW: 14.6 % (ref 11.5–15.5)
WBC: 8.6 10*3/uL (ref 4.0–10.5)

## 2015-04-27 LAB — SEDIMENTATION RATE: Sed Rate: 17 mm/hr (ref 0–22)

## 2015-04-27 NOTE — Progress Notes (Signed)
Subjective:    Patient ID: Maria [name], female    DOB: 05-Aug-1941  MRN: [id number]   Brief patient profile:  84 yowf former smoker remembers lots of missing high school  days in winter with recurrent cough but seemed better as adult s need for meds until  starting 2000 recurrent cough despite quitting smoking in 1971 with no evidence of airflow obst by pfts 04/17/11  referred 09/01/2013 by Dr Marshall Cork to pulmonary clinic   for eval of recurrent cough.    History of Present Illness  09/01/2013 1st [facility] office visit/ Maria [name] cc recurrent severe cough / sob which continue even p acute treatments with abx > continue to cough daily x 2 years esp p stirring and then again 5pm,  Usually dry unless having one of her exac,  with doe x hills,  Best rx was neb lasts x one day.  Better off lopressor/ has received macrodantin in past for uti's rec Stop fish oil Take prilosec 20 mg  Take 30- 60 min before your first and last meals of the day until return  GERD    Avoid macrobid/ nitrofurantoin/macrodantin for kidney  Work on inhaler technique:    Please schedule a follow up office visit in 6 weeks, call sooner if needed with pfts> did not return    04/27/2015  Consultation per Dr Mare Ferrari  / Maria [name] re: cough/ sob  Chief Complaint  Patient presents with  . Follow-up    Pt states cough has improved some, she still has sob; cracking was heard during exam at pcp's office.  no more macrodantin x 2 years   Coughs daily x 4 years day but also tends to get  "acute bronchitis from child exposure" and "catch whatever they have but this only lasts a few weeks"/ cough is genearlly more day than night, more dry than wet  Using alb bid not prn  Using ppi but not consistent at bid  Doe x MMRC 1 no change x sev years Cough nor sob better with saba   No obvious day to day or daytime variabilty or assoc   cp or chest tightness, subjective wheeze overt sinus or hb symptoms. No unusual exp hx or  knowledge of  premature birth.  Sleeping ok without nocturnal  or early am exacerbation  of respiratory  c/o's or need for noct saba. Also denies any obvious fluctuation of symptoms with weather or environmental changes or other aggravating or alleviating factors except as outlined above   Current Medications, Allergies, Complete Past Medical History, Past Surgical History, Family History, and Social History were reviewed in [employer] record.  ROS  The following are not active complaints unless bolded sore throat, dysphagia, dental problems, itching, sneezing,  nasal congestion or excess/ purulent secretions, ear ache,   fever, chills, sweats, unintended wt loss, pleuritic or exertional cp, hemoptysis,  orthopnea pnd or leg swelling, presyncope, palpitations, heartburn, abdominal pain, anorexia, nausea, vomiting, diarrhea  or change in bowel or urinary habits, change in stools or urine, dysuria,hematuria,  rash, arthralgias, visual complaints, headache, numbness weakness or ataxia or problems with walking or coordination,  change in mood/affect or memory.                      Objective:   Physical Exam  04/27/2015       177 Wt Readings from Last 3 Encounters:  09/01/13 160 lb (72.576 kg)  07/30/13 163 lb (73.936 kg)  04/28/13  172 lb 12.8 oz (78.382 kg)     HEENT: nl dentition, turbinates, and orophanx. Nl external ear canals without cough reflex   NECK :  without JVD/Nodes/TM/ nl carotid upstrokes bilaterally   LUNGS: no acc muscle use, trace insp crackles in bases with cough at end of deep insp    CV:  RRR  no s3 or murmur or increase in P2, no edema    ABD:  soft and nontender with nl excursion in the supine position. No bruits or organomegaly, bowel sounds nl  MS:  warm without deformities, calf tenderness, cyanosis or clubbing  SKIN: warm and dry without lesions    NEURO:  alert, approp, no deficits        I personally reviewed images and agree with  radiology impression as follows:  CXR:  12/01/14 No active cardiopulmonary disease.   Labs ordered/ reviewed:   Lab 04/27/15 1044  HGB 13.9  HCT 41.0  WBC 8.6  PLT 308.0        Lab Results  Component Value Date   ESRSEDRATE 17 04/27/2015           Assessment & Plan:

## 2015-04-27 NOTE — Patient Instructions (Signed)
Take protonix (pantoprazole)  Take 30- 60 min before your first and last meals of the day until return   For drainage stop xyzal and take chlortrimeton (chlorpheniramine) 4 mg every 4 hours available over the counter (may cause drowsiness)   Only use your albuterol as a rescue medication to be used if you can't catch your breath by resting or doing a relaxed purse lip breathing pattern.  - The less you use it, the better it will work when you need it. - Ok to use up to 2 puffs  every 4 hours if you must but call for immediate appointment if use goes up over your usual need - Don't leave home without it !!  (think of it like the spare tire for your car)   GERD (REFLUX)  is an extremely common cause of respiratory symptoms, many times with no significant heartburn at all.    It can be treated with medication, but also with lifestyle changes including avoidance of late meals, excessive alcohol, smoking cessation, and avoid fatty foods, chocolate, peppermint, colas, red wine, and acidic juices such as orange juice.  NO MINT OR MENTHOL PRODUCTS SO NO COUGH DROPS  USE SUGARLESS CANDY INSTEAD (jolley ranchers or Stover's)  NO OIL BASED VITAMINS - use powdered substitutes.  Please remember to go to the lab  department downstairs for your tests - we will call you with the results when they are available.       Please schedule a follow up office visit in 6 weeks, call sooner if needed with pfts

## 2015-04-28 LAB — ALLERGY FULL PROFILE
Allergen, D pternoyssinus,d7: <0.1 kU/L
Alternaria Alternata: <0.1 kU/L
BERMUDA GRASS: 1.03 kU/L — AB
BOX ELDER: 0.15 kU/L — AB
Bahia Grass: 0.23 kU/L — ABNORMAL HIGH
Candida Albicans: <0.1 kU/L
Common Ragweed: 0.75 kU/L — ABNORMAL HIGH
Curvularia lunata: <0.1 kU/L
Dog Dander: <0.1 kU/L
Elm IgE: <0.1 kU/L
Fescue: 0.36 kU/L — ABNORMAL HIGH
G005 Rye, Perennial: 0.36 kU/L — ABNORMAL HIGH
G009 Red Top: 0.42 kU/L — ABNORMAL HIGH
Goldenrod: 0.19 kU/L — ABNORMAL HIGH
House Dust Hollister: <0.1 kU/L
IgE (Immunoglobulin E), Serum: 40 kU/L (ref <115)
Lamb's Quarters: 0.21 kU/L — ABNORMAL HIGH
OAK CLASS: 0.14 kU/L — AB
Plantain: 0.15 kU/L — ABNORMAL HIGH
Sycamore Tree: 0.13 kU/L — ABNORMAL HIGH
Timothy Grass: 0.25 kU/L — ABNORMAL HIGH

## 2015-04-28 NOTE — Assessment & Plan Note (Addendum)
-  04/27/2015  Walked RA x 3 laps @ 185 ft each stopped due to  End of study, minimal sob, brisk pace, no desat    I had an extended discussion with the patient reviewing all relevant studies completed to date and  lasting 35  minutes of a 58mnute visit on the following ongoing concerns:  1) I strongly suspect that she has mild/ inactive PF related to previous marcodantin exposure and has not progressed because she is no longer exposed but will always have a few crackles on insp and cough with deep insp effort  2) needs to return for pfts as prev rec but in meantime will see what we can do to help her cough   3) Each maintenance medication was reviewed in detail including most importantly the difference between maintenance and as needed and under what circumstances the prns are to be used.  Please see instructions for details which were reviewed in writing and the patient given a copy.

## 2015-04-28 NOTE — Progress Notes (Signed)
Quick Note:  Spoke with pt and notified of results per Dr. Melvyn Novas. Pt verbalized understanding and denied any questions.  ______

## 2015-04-28 NOTE — Assessment & Plan Note (Signed)
The most common causes of chronic cough in immunocompetent adults include the following: upper airway cough syndrome (UACS), previously referred to as postnasal drip syndrome (PNDS), which is caused by variety of rhinosinus conditions; (2) asthma; (3) GERD; (4) chronic bronchitis from cigarette smoking or other inhaled environmental irritants; (5) nonasthmatic eosinophilic bronchitis; and (6) bronchiectasis.   These conditions, singly or in combination, have accounted for up to 94% of the causes of chronic cough in prospective studies.   Other conditions have constituted no >6% of the causes in prospective studies These have included bronchogenic carcinoma, chronic interstitial pneumonia, sarcoidosis, left ventricular failure, ACEI-induced cough, and aspiration from a condition associated with pharyngeal dysfunction.    Chronic cough is often simultaneously caused by more than one condition. A single cause has been found from 38 to 82% of the time, multiple causes from 18 to 62%. Multiply caused cough has been the result of three diseases up to 42% of the time.   It could well be she has some cough from PF and some from UACS with the latter treatable but the former not   For now rec max gerd rx and add 1st gen h1 then regroup with pfts as previously suggested

## 2015-05-14 ENCOUNTER — Other Ambulatory Visit: Payer: Self-pay | Admitting: Cardiology

## 2015-05-21 ENCOUNTER — Other Ambulatory Visit: Payer: Self-pay | Admitting: Internal Medicine

## 2015-05-23 DIAGNOSIS — Z87440 Personal history of urinary (tract) infections: Secondary | ICD-10-CM | POA: Diagnosis not present

## 2015-05-23 DIAGNOSIS — M545 Low back pain: Secondary | ICD-10-CM | POA: Diagnosis not present

## 2015-05-23 DIAGNOSIS — Z7902 Long term (current) use of antithrombotics/antiplatelets: Secondary | ICD-10-CM | POA: Diagnosis not present

## 2015-05-23 DIAGNOSIS — Z880 Allergy status to penicillin: Secondary | ICD-10-CM | POA: Diagnosis not present

## 2015-05-23 DIAGNOSIS — E780 Pure hypercholesterolemia: Secondary | ICD-10-CM | POA: Diagnosis not present

## 2015-05-23 DIAGNOSIS — I10 Essential (primary) hypertension: Secondary | ICD-10-CM | POA: Diagnosis not present

## 2015-05-23 DIAGNOSIS — Z87891 Personal history of nicotine dependence: Secondary | ICD-10-CM | POA: Diagnosis not present

## 2015-05-23 DIAGNOSIS — Z7982 Long term (current) use of aspirin: Secondary | ICD-10-CM | POA: Diagnosis not present

## 2015-05-23 DIAGNOSIS — N3090 Cystitis, unspecified without hematuria: Secondary | ICD-10-CM | POA: Diagnosis not present

## 2015-06-08 ENCOUNTER — Encounter: Payer: Self-pay | Admitting: Internal Medicine

## 2015-06-08 ENCOUNTER — Ambulatory Visit (INDEPENDENT_AMBULATORY_CARE_PROVIDER_SITE_OTHER): Payer: Commercial Managed Care - HMO | Admitting: Internal Medicine

## 2015-06-08 VITALS — BP 120/80 | HR 78 | Ht 62.0 in | Wt 170.0 lb

## 2015-06-08 DIAGNOSIS — R0600 Dyspnea, unspecified: Secondary | ICD-10-CM | POA: Diagnosis not present

## 2015-06-08 DIAGNOSIS — R05 Cough: Secondary | ICD-10-CM

## 2015-06-08 DIAGNOSIS — E669 Obesity, unspecified: Secondary | ICD-10-CM

## 2015-06-08 DIAGNOSIS — J8410 Pulmonary fibrosis, unspecified: Secondary | ICD-10-CM

## 2015-06-08 DIAGNOSIS — R059 Cough, unspecified: Secondary | ICD-10-CM

## 2015-06-08 LAB — PULMONARY FUNCTION TEST
DL/VA % pred: 124 %
DL/VA: 5.63 ml/min/mmHg/L
DLCO UNC % PRED: 88 %
DLCO UNC: 18.97 ml/min/mmHg
FEF 25-75 Post: 1.82 L/sec
FEF 25-75 Pre: 1.68 L/sec
FEF2575-%Change-Post: 7 %
FEF2575-%Pred-Post: 115 %
FEF2575-%Pred-Pre: 107 %
FEV1-%Change-Post: 0 %
FEV1-%Pred-Post: 86 %
FEV1-%Pred-Pre: 85 %
FEV1-PRE: 1.66 L
FEV1-Post: 1.68 L
FEV1FVC-%CHANGE-POST: 6 %
FEV1FVC-%Pred-Pre: 108 %
FEV6-%CHANGE-POST: -5 %
FEV6-%PRED-PRE: 82 %
FEV6-%Pred-Post: 78 %
FEV6-POST: 1.93 L
FEV6-Pre: 2.03 L
FEV6FVC-%PRED-POST: 105 %
FEV6FVC-%Pred-Pre: 105 %
FVC-%CHANGE-POST: -5 %
FVC-%PRED-POST: 74 %
FVC-%PRED-PRE: 78 %
FVC-PRE: 2.04 L
FVC-Post: 1.93 L
PRE FEV6/FVC RATIO: 100 %
Post FEV1/FVC ratio: 87 %
Post FEV6/FVC ratio: 100 %
Pre FEV1/FVC ratio: 82 %
RV % PRED: 60 %
RV: 1.31 L
TLC % pred: 72 %
TLC: 3.45 L

## 2015-06-08 MED ORDER — AZELASTINE-FLUTICASONE 137-50 MCG/ACT NA SUSP
1.0000 | Freq: Two times a day (BID) | NASAL | Status: DC
Start: 2015-06-08 — End: 2017-05-29

## 2015-06-08 MED ORDER — AZELASTINE-FLUTICASONE 137-50 MCG/ACT NA SUSP: 1.0000 | Freq: Two times a day (BID) | NASAL | Status: DC

## 2015-06-08 NOTE — Patient Instructions (Addendum)
Change protonix Take 30-60 min before first meal of the day and Pepcid ac 20 mg at bedtime   For drainage take chlortrimeton (chlorpheniramine) 4 mg every 4 hours available over the counter (may cause drowsiness)   Try dysmista one twice daily to see if eliminates the am cough / drainage / urge to clear your throat and use hard rock candy but no mint/ menthol or chocolate   Do not take macrodantin any more - see list of allergies   If you are satisfied with your treatment plan,  let your doctor know and he/she can either refill your medications or you can return here when your prescription runs out.     If in any way you are not 100% satisfied,  please tell us.  If 100% better, tell your friends!  Pulmonary follow up is as needed

## 2015-06-08 NOTE — Progress Notes (Signed)
PFT done today. 

## 2015-06-08 NOTE — Progress Notes (Signed)
Subjective:    Patient ID: Maria [name], female    DOB: 14-Jan-1941  MRN: [id number]   Brief patient profile:  17 yowf former smoker remembers lots of missing high school  days in winter with recurrent cough but seemed better as adult s need for meds until  starting 2000 recurrent cough despite quitting smoking in 1971 with no evidence of airflow obst by pfts 04/17/11  referred 09/01/2013 by Dr Marshall Cork to pulmonary clinic   for eval of recurrent cough.    History of Present Illness  09/01/2013 1st [facility] office visit/ Debanhi Blaker cc recurrent severe cough / sob which continue even p acute treatments with abx > continue to cough daily x 2 years esp p stirring and then again 5pm,  Usually dry unless having one of her exac,  with doe x hills,  Best rx was neb lasts x one day.  Better off lopressor/ has received macrodantin in past for uti's rec Stop fish oil Take prilosec 20 mg  Take 30- 60 min before your first and last meals of the day until return  GERD    Avoid macrobid/ nitrofurantoin/macrodantin for kidney  Work on inhaler technique:    Please schedule a follow up office visit in 6 weeks, call sooner if needed with pfts> did not return    04/27/2015  Consultation per Dr Mare Ferrari  / Melvyn Novas re: cough/ sob  Chief Complaint  Patient presents with  . Follow-up    Pt states cough has improved some, she still has sob; cracking was heard during exam at pcp's office.  no more macrodantin x 2 years   Coughs daily x 4 years day but also tends to get  "acute bronchitis from child exposure" and "catch whatever they have but this only lasts a few weeks"/ cough is genearlly more day than night, more dry than wet  Using alb bid not prn  Using ppi but not consistent at bid  Doe x MMRC 1 no change x sev years Cough nor sob better with saba  rec Take protonix (pantoprazole)  Take 30- 60 min before your first and last meals of the day until return  For drainage stop xyzal and take chlortrimeton  (chlorpheniramine) 4 mg every 4 hours available over the counter (may cause drowsiness)  Only use your albuterol as a rescue medication  GERD diet    06/08/2015 f/u ov/[facility] Welliver re: cough /ild  Chief Complaint  Patient presents with  . Follow-up    PFT done today.  Pt states her cough is unchanged. Her breathing is "probably better since I have been more active". She has only used albuterol x 1 since her last visit.   doe still MMRC 1   Main concern is >>> Cough is worse first thing in am and after eat x sev years  - just finished another round of macrodantin " you never told me not to" (see written instructions to the contrary 09/01/13)    No obvious day to day or daytime variabilty or assoc   cp or chest tightness, subjective wheeze overt sinus or hb symptoms. No unusual exp hx or  knowledge of premature birth.  Sleeping ok without nocturnal  or early am exacerbation  of respiratory  c/o's or need for noct saba. Also denies any obvious fluctuation of symptoms with weather or environmental changes or other aggravating or alleviating factors except as outlined above   Current Medications, Allergies, Complete Past Medical History, Past Surgical History, Family History, and  Social History were reviewed in [employer] record.  ROS  The following are not active complaints unless bolded sore throat, dysphagia, dental problems, itching, sneezing,  nasal congestion or excess/ purulent secretions, ear ache,   fever, chills, sweats, unintended wt loss, pleuritic or exertional cp, hemoptysis,  orthopnea pnd or leg swelling, presyncope, palpitations, heartburn, abdominal pain, anorexia, nausea, vomiting, diarrhea  or change in bowel or urinary habits, change in stools or urine, dysuria,hematuria,  rash, arthralgias, visual complaints, headache, numbness weakness or ataxia or problems with walking or coordination,  change in mood/affect or memory.            Objective:   Physical  Exam  04/27/2015       177 > 06/08/2015  174  Wt Readings from Last 3 Encounters:  09/01/13 160 lb (72.576 kg)  07/30/13 163 lb (73.936 kg)  04/28/13 172 lb 12.8 oz (78.382 kg)     HEENT: nl dentition, turbinates, and orophanx. Nl external ear canals without cough reflex   NECK :  without JVD/Nodes/TM/ nl carotid upstrokes bilaterally   LUNGS: no acc muscle use, trace insp crackles in bases with cough at end of deep insp    CV:  RRR  no s3 or murmur or increase in P2, no edema    ABD:  soft and nontender with nl excursion in the supine position. No bruits or organomegaly, bowel sounds nl  MS:  warm without deformities, calf tenderness, cyanosis or clubbing  SKIN: warm and dry without lesions    NEURO:  alert, approp, no deficits        I personally reviewed images and agree with radiology impression as follows:  CXR:  12/01/14 No active cardiopulmonary disease.              Assessment & Plan:

## 2015-06-09 ENCOUNTER — Encounter: Payer: Self-pay | Admitting: Internal Medicine

## 2015-06-09 DIAGNOSIS — E669 Obesity, unspecified: Secondary | ICD-10-CM | POA: Insufficient documentation

## 2015-06-09 NOTE — Assessment & Plan Note (Signed)
pfts with erv 64% 06/08/2015 at  Body mass index is 31.09  C/w effects of body habitus on lung vol Lab Results  Component Value Date   TSH 3.65 11/10/2014   needs to get in and stay in neg cal bal/ reviewed > defer f/u  to primary care

## 2015-06-09 NOTE — Assessment & Plan Note (Addendum)
-   allergy profile 04/26/15 > Eos 0.1 /  IgE 40 pos RAST grasses/ trees/ ragweed     already on singulair/ nasal steroids > rec trial of dymista/ 1st gen h1/ allergy f/u prn

## 2015-06-09 NOTE — Assessment & Plan Note (Signed)
-  spirometry 04/17/11  FEV1  1.51 /FVC  1.75 and ratio 86%  - 04/27/2015  Walked RA x 3 laps @ 185 ft each stopped due to  End of study, minimal sob, brisk pace, no desat and nl esr - PFT s  06/08/15  VC 2.14 s obst/ dlco 88%   ERV 64%     DDx for pulmonary fibrosis  includes idiopathic pulmonary fibrosis, pulmonary fibrosis associated with rheumatologic diseases (which have a relatively benign course in most cases) , adverse effect from  drugs such as chemotherapy or amiodarone exposure, nonspecific interstitial pneumonia which is typically steroid responsive, and chronic hypersensitivity pneumonitis.   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  Dec 2011  This may not be cause and effect, but given how universally unimpressive and expensive  all the other  Drugs developed to day  have been for pf,   rec start  rx ppi / diet/ lifestyle modification and f/u with serial walking sats and lung volumes for now to put more points on the curve / establish firm baseline before considering additional measures.   3) pulmonary f/u can be prn worse doe given such an excellent baseline and obvious avoidable trigger.   4) Each maintenance medication was reviewed in detail including most importantly the difference between maintenance and as needed and under what circumstances the prns are to be used.  Please see instructions for details which were reviewed in writing  and the patient given a copy.

## 2015-06-18 ENCOUNTER — Encounter: Payer: Self-pay | Admitting: Internal Medicine

## 2015-07-26 DIAGNOSIS — J40 Bronchitis, not specified as acute or chronic: Secondary | ICD-10-CM | POA: Diagnosis not present

## 2015-09-26 ENCOUNTER — Other Ambulatory Visit: Payer: Self-pay | Admitting: Cardiology

## 2015-09-29 ENCOUNTER — Other Ambulatory Visit: Payer: Self-pay

## 2015-09-29 MED ORDER — SIMVASTATIN 10 MG PO TABS: 10.0000 mg | ORAL_TABLET | Freq: Every day | ORAL | Status: DC

## 2015-10-03 DIAGNOSIS — Z87891 Personal history of nicotine dependence: Secondary | ICD-10-CM | POA: Diagnosis not present

## 2015-10-03 DIAGNOSIS — Z7982 Long term (current) use of aspirin: Secondary | ICD-10-CM | POA: Diagnosis not present

## 2015-10-03 DIAGNOSIS — R002 Palpitations: Secondary | ICD-10-CM | POA: Diagnosis not present

## 2015-10-03 DIAGNOSIS — Z880 Allergy status to penicillin: Secondary | ICD-10-CM | POA: Diagnosis not present

## 2015-10-03 DIAGNOSIS — K130 Diseases of lips: Secondary | ICD-10-CM | POA: Diagnosis not present

## 2015-10-03 DIAGNOSIS — T63481A Toxic effect of venom of other arthropod, accidental (unintentional), initial encounter: Secondary | ICD-10-CM | POA: Diagnosis not present

## 2015-10-03 DIAGNOSIS — T63441A Toxic effect of venom of bees, accidental (unintentional), initial encounter: Secondary | ICD-10-CM | POA: Diagnosis not present

## 2015-10-22 ENCOUNTER — Other Ambulatory Visit: Payer: Self-pay

## 2015-10-22 DIAGNOSIS — Z1231 Encounter for screening mammogram for malignant neoplasm of breast: Secondary | ICD-10-CM

## 2015-10-27 ENCOUNTER — Other Ambulatory Visit: Payer: Self-pay

## 2015-10-27 MED ORDER — CLOPIDOGREL BISULFATE 75 MG PO TABS: 75.0000 mg | ORAL_TABLET | Freq: Every day | ORAL | Status: DC

## 2015-10-27 MED ORDER — HYDROCHLOROTHIAZIDE 25 MG PO TABS: 25.0000 mg | ORAL_TABLET | Freq: Every day | ORAL | Status: DC

## 2015-10-27 MED ORDER — POTASSIUM CHLORIDE CRYS ER 20 MEQ PO TBCR: 20.0000 meq | EXTENDED_RELEASE_TABLET | Freq: Every day | ORAL | Status: DC

## 2015-10-31 ENCOUNTER — Other Ambulatory Visit: Payer: Self-pay | Admitting: Cardiology

## 2015-11-30 ENCOUNTER — Other Ambulatory Visit: Payer: Self-pay

## 2015-11-30 MED ORDER — POTASSIUM CHLORIDE CRYS ER 20 MEQ PO TBCR: 20.0000 meq | EXTENDED_RELEASE_TABLET | Freq: Every day | ORAL | Status: DC

## 2015-11-30 MED ORDER — AMLODIPINE BESYLATE 10 MG PO TABS: 10.0000 mg | ORAL_TABLET | Freq: Every day | ORAL | Status: DC

## 2015-11-30 MED ORDER — SIMVASTATIN 10 MG PO TABS: 10.0000 mg | ORAL_TABLET | Freq: Every day | ORAL | Status: DC

## 2015-11-30 MED ORDER — MONTELUKAST SODIUM 10 MG PO TABS: ORAL_TABLET | ORAL | Status: DC

## 2015-11-30 MED ORDER — CLOPIDOGREL BISULFATE 75 MG PO TABS: 75.0000 mg | ORAL_TABLET | Freq: Every day | ORAL | Status: DC

## 2015-11-30 MED ORDER — HYDROCHLOROTHIAZIDE 25 MG PO TABS: 25.0000 mg | ORAL_TABLET | Freq: Every day | ORAL | Status: DC

## 2015-11-30 MED ORDER — LEVOCETIRIZINE DIHYDROCHLORIDE 5 MG PO TABS: 5.0000 mg | ORAL_TABLET | Freq: Every evening | ORAL | Status: DC

## 2015-12-01 ENCOUNTER — Ambulatory Visit
Admission: RE | Admit: 2015-12-01 | Discharge: 2015-12-01 | Disposition: A | Discharge status: Home / Self Care | Payer: Commercial Managed Care - HMO | Source: Ambulatory Visit

## 2015-12-01 DIAGNOSIS — Z1231 Encounter for screening mammogram for malignant neoplasm of breast: Secondary | ICD-10-CM

## 2015-12-16 ENCOUNTER — Encounter: Payer: Self-pay | Admitting: Internal Medicine

## 2015-12-16 ENCOUNTER — Ambulatory Visit (INDEPENDENT_AMBULATORY_CARE_PROVIDER_SITE_OTHER): Payer: Commercial Managed Care - HMO | Admitting: Internal Medicine

## 2015-12-16 VITALS — BP 116/80 | HR 80 | Temp 97.8°F | Ht 62.0 in | Wt 174.0 lb

## 2015-12-16 DIAGNOSIS — R609 Edema, unspecified: Secondary | ICD-10-CM

## 2015-12-16 DIAGNOSIS — I10 Essential (primary) hypertension: Secondary | ICD-10-CM | POA: Diagnosis not present

## 2015-12-16 DIAGNOSIS — Z23 Encounter for immunization: Secondary | ICD-10-CM | POA: Diagnosis not present

## 2015-12-16 DIAGNOSIS — R7302 Impaired glucose tolerance (oral): Secondary | ICD-10-CM | POA: Diagnosis not present

## 2015-12-16 DIAGNOSIS — Z0000 Encounter for general adult medical examination without abnormal findings: Secondary | ICD-10-CM

## 2015-12-16 MED ORDER — AMLODIPINE BESYLATE 10 MG PO TABS: ORAL_TABLET | ORAL | Status: DC

## 2015-12-16 NOTE — Assessment & Plan Note (Signed)

## 2015-12-16 NOTE — Patient Instructions (Addendum)
You had the flu shot today  OK to try decreasing the amlodipine to 5 mg per day (half of the 10 mg)  If this works better with the swelling, plesae let us know for the next refill with the mail in pharmacy  You should also consider compression stocking use during the day only when not able to elevate the legs, such as the hosiery place in [place]  Please continue to monitor your Blood Pressure as well, as your goal is to remain less than 140/90, even with the reduced amlodipine  Please continue all other medications as before, and refills have been done if requested.  Please have the pharmacy call with any other refills you may need.  Please continue your efforts at being more active, low cholesterol diet, and weight control.  You are otherwise up to date with prevention measures today.  Please keep your appointments with your specialists as you may have planned  Please go to the LAB in the Basement (turn left off the elevator) for the tests to be done today  You will be contacted by phone if any changes need to be made immediately.  Otherwise, you will receive a letter about your results with an explanation, but please check with MyChart first.  Please remember to sign up for MyChart if you have not done so, as this will be important to you in the future with finding out test results, communicating by private email, and scheduling acute appointments online when needed.  Please return in 1 year for your yearly visit, or sooner if needed, with Lab testing done 3-5 days before

## 2015-12-16 NOTE — Assessment & Plan Note (Signed)
C/w venous insuff, for cont hct, decresaed amlod to 5 qd as above, low salt diet, wt loss, leg elevation , compression stockings asd

## 2015-12-16 NOTE — Progress Notes (Signed)
Pre visit review using our clinic review tool, if applicable. No additional management support is needed unless otherwise documented below in the visit note.

## 2015-12-16 NOTE — Assessment & Plan Note (Signed)
Ok to decr the amlod to 5 qd to see if swelling decreased

## 2015-12-16 NOTE — Progress Notes (Signed)
Subjective:    Patient ID: Maria [name], female    DOB: [birth year], 74 y.o.   MRN: [id number]  HPI  Here for wellness and f/u;  Overall doing ok;  Pt denies Chest pain, worsening SOB, DOE, wheezing, orthopnea, PND, palpitations, dizziness or syncope.  Pt denies neurological change such as new headache, facial or extremity weakness.  Pt denies polydipsia, polyuria, or low sugar symptoms. Pt states overall good compliance with treatment and medications, good tolerability, and has been trying to follow appropriate diet.  Pt denies worsening depressive symptoms, suicidal ideation or panic. No fever, night sweats, wt loss, loss of appetite, or other constitutional symptoms.  Pt states good ability with ADL's, has low fall risk, home safety reviewed and adequate, no other significant changes in hearing or vision, and only occasionally active with exercise.  Has some worsening bilat LE edeam left > right, on amlodipine, and hx of diastolic dysfxn but improved with last echo per Dr Mare Ferrari 2011.  Also on HCT.  Gets better in the AM, worse by evening again. Past Medical History  Diagnosis Date  . Carpal tunnel syndrome   . Anemia   . Arthritis   . Hypertension   . OSA (obstructive sleep apnea)   . Headache(784.0)     chronic  . Diverticulosis   . GERD (gastroesophageal reflux disease)   . Palpitations   . Hyperlipidemia   . TIA (transient ischemic attack)   . Vertigo, benign paroxysmal   . Migraine   . Personal history of colonic adenomas 09/15/2010  . Nephritis    Past Surgical History  Procedure Laterality Date  . Carpal tunnel release      x 2  . Breast biopsy      rt benign cyst  . Replacement total knee bilateral    . Tonsillectomy    . Bladder repair    . Colonoscopy    . Upper gastrointestinal endoscopy      reports that she quit smoking about 46 years ago. Her smoking use included Cigarettes. She has a 10 pack-year smoking history. She has never used smokeless tobacco. She  reports that she drinks alcohol. She reports that she does not use illicit drugs. family history includes Aneurysm in her father; Chronic bronchitis in her mother; Colon cancer in her maternal grandmother; Colon polyps in her mother; Diabetes in her brother and father; Emphysema in her maternal grandfather; Heart disease in her mother; Hypertension in her father; Irritable bowel syndrome in her daughter; Kidney disease in her father. Allergies  Allergen Reactions  . Amoxicillin Hives  . Ace Inhibitors   . Benicar [Olmesartan Medoxomil]   . Codeine     REACTION: nausea  . Lisinopril   . Macrobid [Nitrofurantoin Monohyd Macro]     Pulmonary Fibrosis  . Nutritional Supplements     Bee stings cause angioedema-carry an EPIPEN   Current Outpatient Prescriptions on File Prior to Visit  Medication Sig Dispense Refill  . albuterol (PROAIR HFA) 108 (90 BASE) MCG/ACT inhaler INHALE 2 PUFFS INTO THE LUNCH EVERY 6 HOURS AS NEEDED FOR WHEEZING OR SHORTNESS OF BREATH 8.5 each 0  . amLODipine (NORVASC) 10 MG tablet Take 1 tablet (10 mg total) by mouth daily. 90 tablet 1  . aspirin 81 MG tablet Take 81 mg by mouth daily.    . Azelastine-Fluticasone (DYMISTA) 137-50 MCG/ACT SUSP Place 1 puff into the nose 2 (two) times daily. 6 g 0  . b complex vitamins tablet Take 1 tablet by  mouth daily.      Marland Kitchen CALCIUM PO Take by mouth daily.      . Cholecalciferol (VITAMIN D-3 PO) Take by mouth daily.      . clopidogrel (PLAVIX) 75 MG tablet Take 1 tablet (75 mg total) by mouth daily. 90 tablet 1  . EPINEPHrine (EPIPEN) 0.3 mg/0.3 mL SOAJ injection Inject 0.3 mL (0.3 mg total) into the muscle once. 2 Device 1  . hydrochlorothiazide (HYDRODIURIL) 25 MG tablet Take 1 tablet (25 mg total) by mouth daily. 90 tablet 1  . levocetirizine (XYZAL) 5 MG tablet Take 1 tablet (5 mg total) by mouth every evening. 90 tablet 3  . magnesium oxide (MAG-OX) 400 MG tablet Take 400 mg by mouth daily.    . montelukast (SINGULAIR) 10 MG  tablet TAKE 1 TABLET (10 MG TOTAL) BY MOUTH AT BEDTIME. 90 tablet 1  . pantoprazole (PROTONIX) 40 MG tablet Take 1 tablet (40 mg total) by mouth 2 (two) times daily before a meal. Breakfast and supper 60 tablet 3  . potassium chloride SA (KLOR-CON M20) 20 MEQ tablet Take 1 tablet (20 mEq total) by mouth daily. 90 tablet 1  . simvastatin (ZOCOR) 10 MG tablet Take 1 tablet (10 mg total) by mouth daily at 6 PM. 90 tablet 1   No current facility-administered medications on file prior to visit.    Review of Systems  Constitutional: Negative for unusual diaphoresis or night sweats HENT: Negative for ringing in ear or discharge Eyes: Negative for double vision or worsening visual disturbance.  Respiratory: Negative for choking and stridor.   Gastrointestinal: Negative for vomiting or other signifcant bowel change Genitourinary: Negative for hematuria or change in urine volume.  Musculoskeletal: Negative for other MSK pain or swelling Skin: Negative for color change and worsening wound.  Neurological: Negative for tremors and numbness other than noted  Psychiatric/Behavioral: Negative for decreased concentration or agitation other than above       Objective:   Physical Exam BP 116/80 mmHg  Pulse 80  Temp(Src) 97.8 F (36.6 C) (Oral)  Ht _0  (1.575 m)  Wt 174 lb (78.926 kg)  BMI 31.82 kg/m2  SpO2 97% VS noted,  Constitutional: Pt is oriented to person, place, and time. Appears well-developed and well-nourished, in no significant distress Head: Normocephalic and atraumatic.  Right Ear: External ear normal.  Left Ear: External ear normal.  Nose: Nose normal.  Mouth/Throat: Oropharynx is clear and moist.  Eyes: Conjunctivae and EOM are normal. Pupils are equal, round, and reactive to light.  Neck: Normal range of motion. Neck supple. No JVD present. No tracheal deviation present or significant neck LA or mass Cardiovascular: Normal rate, regular rhythm, normal heart sounds and intact  distal pulses.   Pulmonary/Chest: Effort normal and breath sounds without rales or wheezing  Abdominal: Soft. Bowel sounds are normal. NT. No HSM  Musculoskeletal: Normal range of motion. Exhibits trace edeam left > right to knees edema.  Lymphadenopathy:  Has no cervical adenopathy.  Neurological: Pt is alert and oriented to person, place, and time. Pt has normal reflexes. No cranial nerve deficit. Motor grossly intact Skin: Skin is warm and dry. No rash noted.  Psychiatric:  Has normal mood and affect. Behavior is normal.     Assessment & Plan:

## 2015-12-16 NOTE — Assessment & Plan Note (Signed)
stable overall by history and exam, recent data reviewed with pt, and pt to continue medical treatment as before,  to f/u any worsening symptoms or concerns Lab Results  Component Value Date   HGBA1C 6.2 11/10/2014

## 2015-12-24 ENCOUNTER — Telehealth: Payer: Self-pay | Admitting: Internal Medicine

## 2015-12-24 MED ORDER — AMLODIPINE BESYLATE 5 MG PO TABS: 5.0000 mg | ORAL_TABLET | Freq: Every day | ORAL | Status: DC

## 2015-12-24 NOTE — Telephone Encounter (Signed)
Pt called state Dr. Jenny Reichmann suppose to change the amLODipine (NORVASC) 10 MG tablet to 23m on 12/16/15. [facility] [place] [facility]

## 2016-06-01 ENCOUNTER — Telehealth: Payer: Self-pay | Admitting: Internal Medicine

## 2016-06-01 ENCOUNTER — Other Ambulatory Visit: Payer: Self-pay | Admitting: Internal Medicine

## 2016-06-01 NOTE — Telephone Encounter (Signed)
Attempted to contact pt. No answer, no option to leave a message. Will try back.  Pt has not been seen in over 1 year. Will need ROV.

## 2016-06-05 NOTE — Telephone Encounter (Signed)
atc pt X2, no answer, no vm set up.  Wcb.

## 2016-06-06 NOTE — Telephone Encounter (Signed)
ATC x 3, no vmail set up to leave message

## 2016-06-07 NOTE — Telephone Encounter (Signed)
Spoke with pt. Advised her that we have not seen her in the office in over 1 year. States that she will call her PCP for refill. Nothing further was needed.

## 2016-06-16 ENCOUNTER — Telehealth: Payer: Self-pay | Admitting: *Deleted

## 2016-06-16 NOTE — Telephone Encounter (Signed)
Left msg on triage needing some refills. Called pt no answer LMOM RTC w/name of medications she is needing...Maria [name]

## 2016-06-19 NOTE — Telephone Encounter (Signed)
Tried calling pt back several times today no answer & left 2 msg's. Closing encounter.Marland KitchenJohny [name]

## 2016-06-21 ENCOUNTER — Other Ambulatory Visit: Payer: Self-pay | Admitting: *Deleted

## 2016-06-21 MED ORDER — SIMVASTATIN 10 MG PO TABS: 10.0000 mg | ORAL_TABLET | Freq: Every day | ORAL | Status: DC

## 2016-06-21 MED ORDER — HYDROCHLOROTHIAZIDE 25 MG PO TABS: 25.0000 mg | ORAL_TABLET | Freq: Every day | ORAL | Status: DC

## 2016-06-21 MED ORDER — CLOPIDOGREL BISULFATE 75 MG PO TABS: 75.0000 mg | ORAL_TABLET | Freq: Every day | ORAL | Status: DC

## 2016-06-21 NOTE — Telephone Encounter (Signed)
Left msg on triage stating needing refills sent to [facility] on her Pravastatin, Plavix, and HCTZ.  Refill sent to [facility]....Johny Chess

## 2016-06-22 ENCOUNTER — Other Ambulatory Visit: Payer: Self-pay | Admitting: Internal Medicine

## 2016-08-16 DIAGNOSIS — T63444A Toxic effect of venom of bees, undetermined, initial encounter: Secondary | ICD-10-CM | POA: Diagnosis not present

## 2016-10-06 ENCOUNTER — Other Ambulatory Visit: Payer: Self-pay | Admitting: Internal Medicine

## 2016-10-13 ENCOUNTER — Encounter (HOSPITAL_COMMUNITY): Payer: Self-pay | Admitting: Emergency Medicine

## 2016-10-13 ENCOUNTER — Emergency Department (HOSPITAL_COMMUNITY)
Admission: EM | Admit: 2016-10-13 | Discharge: 2016-10-13 | Disposition: A | Discharge status: Home / Self Care | Payer: Commercial Managed Care - HMO | Attending: Emergency Medicine | Admitting: Emergency Medicine

## 2016-10-13 DIAGNOSIS — I10 Essential (primary) hypertension: Secondary | ICD-10-CM | POA: Insufficient documentation

## 2016-10-13 DIAGNOSIS — Z79899 Other long term (current) drug therapy: Secondary | ICD-10-CM | POA: Insufficient documentation

## 2016-10-13 DIAGNOSIS — Z7982 Long term (current) use of aspirin: Secondary | ICD-10-CM | POA: Diagnosis not present

## 2016-10-13 DIAGNOSIS — Z87891 Personal history of nicotine dependence: Secondary | ICD-10-CM | POA: Insufficient documentation

## 2016-10-13 DIAGNOSIS — R220 Localized swelling, mass and lump, head: Secondary | ICD-10-CM | POA: Diagnosis present

## 2016-10-13 DIAGNOSIS — T783XXA Angioneurotic edema, initial encounter: Secondary | ICD-10-CM | POA: Insufficient documentation

## 2016-10-13 HISTORY — DX: Pure hypercholesterolemia, unspecified: E78.00

## 2016-10-13 MED ORDER — METHYLPREDNISOLONE SODIUM SUCC 125 MG IJ SOLR
125.0000 mg | Freq: Once | INTRAMUSCULAR | Status: AC
  Administered 2016-10-13: 125 mg via INTRAVENOUS
  Filled 2016-10-13: qty 2

## 2016-10-13 MED ORDER — EPINEPHRINE 0.3 MG/0.3ML IJ SOAJ
0.3000 mg | Freq: Once | INTRAMUSCULAR | Status: AC
  Administered 2016-10-13: 0.3 mg via INTRAMUSCULAR
  Filled 2016-10-13: qty 0.3

## 2016-10-13 MED ORDER — PREDNISONE 50 MG PO TABS: ORAL_TABLET | ORAL | 0 refills | Status: DC

## 2016-10-13 MED ORDER — DIPHENHYDRAMINE HCL 50 MG/ML IJ SOLN
25.0000 mg | Freq: Once | INTRAMUSCULAR | Status: AC
  Administered 2016-10-13: 25 mg via INTRAVENOUS
  Filled 2016-10-13: qty 1

## 2016-10-13 MED ORDER — EPINEPHRINE 0.3 MG/0.3ML IJ SOAJ: 0.3000 mg | Freq: Once | INTRAMUSCULAR | 1 refills | Status: AC

## 2016-10-13 NOTE — ED Notes (Signed)
No respiratory or acute distress noted alert and oriented x 3 call light in reach no reaction to medication noted no drooling, stridor noted clear speech noted.

## 2016-10-13 NOTE — ED Notes (Signed)
Pt ambulated to BR and back to room w/o difficulty

## 2016-10-13 NOTE — ED Notes (Signed)
No respiratory or acute distress noted alert and oriented x 3 no drooling noted clear speech noted no stridor noted.

## 2016-10-13 NOTE — ED Notes (Signed)
Pt is being allowed to sleep for approx 1 hour since she is driving per charge RN

## 2016-10-13 NOTE — ED Provider Notes (Signed)
[facility] DEPT Provider Note   CSN: [id number] Arrival date & time: 10/13/16  9024  By signing my name below, I, Higinio Plan, attest that this documentation has been prepared under the direction and in the presence of Ripley Fraise, MD . Electronically Signed: Higinio Plan, Scribe. 10/13/2016. 3:40 AM.  History   Chief Complaint Chief Complaint  Patient presents with  . Angioedema   The history is provided by the patient. No language interpreter was used.   HPI Comments: Maria [name] is a 75 y.o. female who presents to the Emergency Department complaining of gradually worsening, facial swelling that began at ~8:00 PM this evening. Pt reports she was watching tv at the onset of her symptoms; she is unsure of the cause of her swelling but states she may have been bitten by an insect. She notes hx of similar symptoms when she was stung by a bee and during a reaction to benazepril but no longer on ACE inhibitor. She notes she took 25 mg benadryl at home with no relief. Pt reports associated difficulty swallowing and sensation of tongue swelling that began while in the ED. She denies headache, chest pain, shortness of breath, vomiting, diarrhea, loss of consciousness and rash.   Past Medical History:  Diagnosis Date  . Anemia   . Arthritis   . Carpal tunnel syndrome   . Diverticulosis   . GERD (gastroesophageal reflux disease)   . Headache(784.0)    chronic  . High cholesterol   . Hyperlipidemia   . Hypertension   . Migraine   . Nephritis   . OSA (obstructive sleep apnea)   . Palpitations   . Personal history of colonic adenomas 09/15/2010  . TIA (transient ischemic attack)   . Vertigo, benign paroxysmal     Patient Active Problem List   Diagnosis Date Noted  . Edema 12/16/2015  . Obesity 06/09/2015  . Postinflammatory pulmonary fibrosis ([place]) 04/27/2015  . Allergic rhinitis 03/19/2015  . Essential hypertension 03/19/2015  . Acute bronchitis 01/20/2015  . Wheezing  01/20/2015  . History of esophageal stricture 09/29/2013  . chronic cough ? etiology  09/02/2013  . Atrial fibrillation ([place]) 04/09/2013  . Impaired glucose tolerance 05/24/2011  . Migraine   . Preventative health care 05/21/2011  . Hyperlipidemia   . TIA (transient ischemic attack)   . Vertigo, benign paroxysmal   . Obstructive sleep apnea 03/22/2011  . Paresthesias 03/22/2011  . Insomnia 03/22/2011  . Personal history of colonic adenomas 09/15/2010  . ESOPHAGEAL STRICTURE 08/11/2010  . GERD 08/11/2010    Past Surgical History:  Procedure Laterality Date  . BLADDER REPAIR    . BREAST BIOPSY     rt benign cyst  . CARPAL TUNNEL RELEASE     x 2  . COLONOSCOPY    . REPLACEMENT TOTAL KNEE BILATERAL    . TONSILLECTOMY    . UPPER GASTROINTESTINAL ENDOSCOPY      OB History    No data available     Home Medications    Prior to Admission medications   Medication Sig Start Date End Date Taking? Authorizing Provider  amLODipine (NORVASC) 5 MG tablet Take 1 tablet (5 mg total) by mouth daily. 12/24/15   Biagio Borg, MD  aspirin 81 MG tablet Take 81 mg by mouth daily.    Historical Provider, MD  Azelastine-Fluticasone (DYMISTA) 137-50 MCG/ACT SUSP Place 1 puff into the nose 2 (two) times daily. 06/08/15   Tanda Rockers, MD  b complex vitamins tablet  Take 1 tablet by mouth daily.      Historical Provider, MD  CALCIUM PO Take by mouth daily.      Historical Provider, MD  Cholecalciferol (VITAMIN D-3 PO) Take by mouth daily.      Historical Provider, MD  clopidogrel (PLAVIX) 75 MG tablet Take 1 tablet (75 mg total) by mouth daily. Yearly physical due in Dec must see MD for refills 06/21/16   Biagio Borg, MD  EPINEPHrine (EPIPEN) 0.3 mg/0.3 mL SOAJ injection Inject 0.3 mL (0.3 mg total) into the muscle once. 07/31/13   Biagio Borg, MD  hydrochlorothiazide (HYDRODIURIL) 25 MG tablet Take 1 tablet (25 mg total) by mouth daily. Yearly physical due in Dec must see MD for future refills 06/21/16    Biagio Borg, MD  levocetirizine (XYZAL) 5 MG tablet Take 1 tablet (5 mg total) by mouth every evening. 11/30/15   Biagio Borg, MD  magnesium oxide (MAG-OX) 400 MG tablet Take 400 mg by mouth daily.    Historical Provider, MD  montelukast (SINGULAIR) 10 MG tablet TAKE 1 TABLET AT BEDTIME 10/06/16   Biagio Borg, MD  pantoprazole (PROTONIX) 40 MG tablet Take 1 tablet (40 mg total) by mouth 2 (two) times daily before a meal. Breakfast and supper 11/20/13   Gatha Mayer, MD  potassium chloride SA (K-DUR,KLOR-CON) 20 MEQ tablet TAKE 1 TABLET EVERY DAY 06/22/16   Biagio Borg, MD  PROAIR HFA 108 [phone] Base) MCG/ACT inhaler INHALE 2 PUFFS INTO THE LUNCH EVERY 6 HOURS AS NEEDED FOR WHEEZING OR SHORTNESS OF BREATH 06/01/16   Biagio Borg, MD  simvastatin (ZOCOR) 10 MG tablet Take 1 tablet (10 mg total) by mouth daily at 6 PM. Yearly physical is due in Dec. Must see md for future refills 06/21/16   Biagio Borg, MD    Family History Family History  Problem Relation Age of Onset  . Diabetes Father   . Aneurysm Father   . Kidney disease Father   . Hypertension Father   . Heart disease Mother   . Colon polyps Mother   . Chronic bronchitis Mother   . Diabetes Brother   . Colon cancer Maternal Grandmother   . Irritable bowel syndrome Daughter   . Emphysema Maternal Grandfather     smoked a pipe    Social History Social History  Substance Use Topics  . Smoking status: Former Smoker    Packs/day: 1.00    Years: 10.00    Types: Cigarettes    Quit date: 12/18/1969  . Smokeless tobacco: Never Used  . Alcohol use Yes     Comment: ocassional use     Allergies   Amoxicillin; Ace inhibitors; Bee venom; Benicar [olmesartan medoxomil]; Codeine; Lisinopril; Macrobid [nitrofurantoin monohyd macro]; and Nutritional supplements   Review of Systems Review of Systems  HENT: Positive for facial swelling and trouble swallowing.   Respiratory: Negative for shortness of breath.   Cardiovascular: Negative  for chest pain.  Gastrointestinal: Negative for diarrhea and vomiting.  Skin: Negative for rash.  Neurological: Negative for syncope and headaches.  All other systems reviewed and are negative.  Physical Exam Updated Vital Signs BP 124/67 (BP Location: Left Arm)   Pulse 79   Temp 98.3 F (36.8 C) (Oral)   Resp 18   SpO2 97%   Physical Exam CONSTITUTIONAL: Well developed/well nourished HEAD: Normocephalic/atraumatic EYES: EOMI/PERRL ENMT: Mucous membranes moist, upper and lower lip edema noted, no tongue edema noted, uvula midline  without edema, no stridor, no drooling NECK: supple no meningeal signs SPINE/BACK:entire spine nontender CV: S1/S2 noted, no murmurs/rubs/gallops noted LUNGS: Lungs are clear to auscultation bilaterally, no apparent distress ABDOMEN: soft, nontender NEURO: Pt is awake/alert/appropriate, moves all extremitiesx4.  No facial droop.   EXTREMITIES: pulses normal/equal, full ROM SKIN: warm, color normal, no rash PSYCH: no abnormalities of mood noted, alert and oriented to situation  ED Treatments / Results  Labs (all labs ordered are listed, but only abnormal results are displayed) Labs Reviewed - No data to display  EKG  EKG Interpretation None       Radiology No results found.  Procedures Procedures (including critical care time)  Medications Ordered in ED Medications  EPINEPHrine (EPI-PEN) injection 0.3 mg (0.3 mg Intramuscular Given 10/13/16 0344)  diphenhydrAMINE (BENADRYL) injection 25 mg (25 mg Intravenous Given 10/13/16 0344)  methylPREDNISolone sodium succinate (SOLU-MEDROL) 125 mg/2 mL injection 125 mg (125 mg Intravenous Given 10/13/16 0344)    DIAGNOSTIC STUDIES:  Oxygen Saturation is 97% on RA, normal by my interpretation.    COORDINATION OF CARE:  3:30 AM Discussed treatment plan with pt at bedside and pt agreed to plan.  Initial Impression / Assessment and Plan / ED Course  I have reviewed the triage vital signs and  the nursing notes.   Clinical Course    4:40 AM Pt improved Resting comfortably No worsening angioedema noted 5:42 AM Pt improved, resting comfortably, will continue to monitor 7:16 AM Pt improved She is taking PO She still has some lip swelling but much improved No tongue edema I feel she is appropriate for d/c home Will give Rx for epipen and prednisone I discussed appropriate use and strict return precautions  I personally performed the services described in this documentation, which was scribed in my presence. The recorded information has been reviewed and is accurate.   Final Clinical Impressions(s) / ED Diagnoses   Final diagnoses:  Angioedema of lips, initial encounter    New Prescriptions New Prescriptions   EPINEPHRINE 0.3 MG/0.3 ML IJ SOAJ INJECTION    Inject 0.3 mLs (0.3 mg total) into the muscle once.   PREDNISONE (DELTASONE) 50 MG TABLET    1 tablet PO QD X4 days     Ripley Fraise, MD 10/13/16 [phone]

## 2016-10-13 NOTE — ED Notes (Signed)
Ambulatory to bathroom and void qs. Lip swelling decreased, re-evaluated by Dr. Sharman Crate

## 2016-10-13 NOTE — ED Notes (Signed)
No respiratory or acute distress noted resting in bed with eyes closed call light in reach no reaction to medication noted lip swelling is going down on visual inspection.

## 2016-10-13 NOTE — ED Notes (Signed)
No respiratory or acute distress noted alert and oriented x 3 no reaction to medication noted clear speech noted no drooling noted no stridor noted call light in reach.

## 2016-10-13 NOTE — ED Triage Notes (Signed)
Pt states she started having some facial swelling about 8pm last night  States she took benadryl 20m around 2030  Pt came to ED with significant facial swelling and states her tongue started to feel like it may be swelling  No acute resp distress noted  Denies difficulty swallowing  Pt taken to Res [facility]  EDP [facility]line notified

## 2016-11-17 ENCOUNTER — Other Ambulatory Visit: Payer: Self-pay | Admitting: Internal Medicine

## 2016-11-17 DIAGNOSIS — Z1231 Encounter for screening mammogram for malignant neoplasm of breast: Secondary | ICD-10-CM

## 2016-11-21 ENCOUNTER — Ambulatory Visit: Payer: Commercial Managed Care - HMO

## 2016-12-01 ENCOUNTER — Other Ambulatory Visit (INDEPENDENT_AMBULATORY_CARE_PROVIDER_SITE_OTHER): Payer: Commercial Managed Care - HMO

## 2016-12-01 ENCOUNTER — Ambulatory Visit (INDEPENDENT_AMBULATORY_CARE_PROVIDER_SITE_OTHER): Payer: Commercial Managed Care - HMO | Admitting: Geriatric Medicine

## 2016-12-01 DIAGNOSIS — Z23 Encounter for immunization: Secondary | ICD-10-CM | POA: Diagnosis not present

## 2016-12-01 DIAGNOSIS — R7302 Impaired glucose tolerance (oral): Secondary | ICD-10-CM | POA: Diagnosis not present

## 2016-12-01 DIAGNOSIS — Z0000 Encounter for general adult medical examination without abnormal findings: Secondary | ICD-10-CM | POA: Diagnosis not present

## 2016-12-01 LAB — URINALYSIS, ROUTINE W REFLEX MICROSCOPIC
BILIRUBIN URINE: NEGATIVE
Ketones, ur: NEGATIVE
NITRITE: NEGATIVE
Specific Gravity, Urine: 1.02 (ref 1.000–1.030)
Total Protein, Urine: NEGATIVE
Urine Glucose: NEGATIVE
Urobilinogen, UA: 0.2 (ref 0.0–1.0)
pH: 6 (ref 5.0–8.0)

## 2016-12-01 LAB — HEMOGLOBIN A1C: HEMOGLOBIN A1C: 5.8 % (ref 4.6–6.5)

## 2016-12-01 LAB — HEPATIC FUNCTION PANEL
ALT: 17 U/L (ref 0–35)
AST: 20 U/L (ref 0–37)
Albumin: 4.4 g/dL (ref 3.5–5.2)
Alkaline Phosphatase: 59 U/L (ref 39–117)
BILIRUBIN DIRECT: 0.1 mg/dL (ref 0.0–0.3)
BILIRUBIN TOTAL: 0.4 mg/dL (ref 0.2–1.2)
Total Protein: 7 g/dL (ref 6.0–8.3)

## 2016-12-01 LAB — CBC WITH DIFFERENTIAL/PLATELET
BASOS PCT: 0.5 % (ref 0.0–3.0)
Basophils Absolute: 0 10*3/uL (ref 0.0–0.1)
EOS PCT: 1.8 % (ref 0.0–5.0)
Eosinophils Absolute: 0.1 10*3/uL (ref 0.0–0.7)
HEMATOCRIT: 42.5 % (ref 36.0–46.0)
HEMOGLOBIN: 14.2 g/dL (ref 12.0–15.0)
LYMPHS PCT: 30.1 % (ref 12.0–46.0)
Lymphs Abs: 2.2 10*3/uL (ref 0.7–4.0)
MCHC: 33.5 g/dL (ref 30.0–36.0)
MCV: 89 fl (ref 78.0–100.0)
MONOS PCT: 9.7 % (ref 3.0–12.0)
Monocytes Absolute: 0.7 10*3/uL (ref 0.1–1.0)
NEUTROS ABS: 4.2 10*3/uL (ref 1.4–7.7)
Neutrophils Relative %: 57.9 % (ref 43.0–77.0)
PLATELETS: 268 10*3/uL (ref 150.0–400.0)
RBC: 4.78 Mil/uL (ref 3.87–5.11)
RDW: 14.4 % (ref 11.5–15.5)
WBC: 7.3 10*3/uL (ref 4.0–10.5)

## 2016-12-01 LAB — LIPID PANEL
CHOLESTEROL: 137 mg/dL (ref 0–200)
HDL: 60.3 mg/dL (ref >39.00)
LDL Cholesterol: 66 mg/dL (ref 0–99)
NonHDL: 76.56
TRIGLYCERIDES: 52 mg/dL (ref 0.0–149.0)
Total CHOL/HDL Ratio: 2
VLDL: 10.4 mg/dL (ref 0.0–40.0)

## 2016-12-01 LAB — BASIC METABOLIC PANEL
BUN: 13 mg/dL (ref 6–23)
CO2: 30 meq/L (ref 19–32)
Calcium: 9.7 mg/dL (ref 8.4–10.5)
Chloride: 102 mEq/L (ref 96–112)
Creatinine, Ser: 0.71 mg/dL (ref 0.40–1.20)
GFR: 85.05 mL/min (ref >60.00)
GLUCOSE: 104 mg/dL — AB (ref 70–99)
POTASSIUM: 3.4 meq/L — AB (ref 3.5–5.1)
SODIUM: 140 meq/L (ref 135–145)

## 2016-12-01 LAB — TSH: TSH: 3.14 u[IU]/mL (ref 0.35–4.50)

## 2016-12-13 NOTE — Telephone Encounter (Signed)
error

## 2016-12-23 ENCOUNTER — Encounter: Payer: Self-pay | Admitting: Internal Medicine

## 2016-12-23 ENCOUNTER — Ambulatory Visit (INDEPENDENT_AMBULATORY_CARE_PROVIDER_SITE_OTHER): Payer: Medicare HMO | Admitting: Internal Medicine

## 2016-12-23 DIAGNOSIS — J209 Acute bronchitis, unspecified: Secondary | ICD-10-CM | POA: Diagnosis not present

## 2016-12-23 MED ORDER — PREDNISONE 20 MG PO TABS
40.0000 mg | ORAL_TABLET | Freq: Every day | ORAL | 0 refills | Status: DC
Start: 2016-12-23 — End: 2017-02-27

## 2016-12-23 MED ORDER — DOXYCYCLINE HYCLATE 100 MG PO TABS: 100.0000 mg | ORAL_TABLET | Freq: Two times a day (BID) | ORAL | 0 refills | Status: DC

## 2016-12-23 MED ORDER — ALBUTEROL SULFATE HFA 108 (90 BASE) MCG/ACT IN AERS: INHALATION_SPRAY | RESPIRATORY_TRACT | 2 refills | Status: DC

## 2016-12-23 MED ORDER — HYDROCODONE-HOMATROPINE 5-1.5 MG/5ML PO SYRP: 5.0000 mL | ORAL_SOLUTION | Freq: Three times a day (TID) | ORAL | 0 refills | Status: DC | PRN

## 2016-12-23 NOTE — Progress Notes (Signed)
Pre visit review using our clinic review tool, if applicable. No additional management support is needed unless otherwise documented below in the visit note.

## 2016-12-23 NOTE — Progress Notes (Signed)
   Subjective:    Patient ID: Maria [name], female    DOB: September 11, 1941, 76 y.o.   MRN: [id number]  HPI The patient is a 76 YO female coming in for cough symptoms. Was away in [place] and got steroid injection first and then they gave her penicillin shots for several days in a row. She was having some nose drainage and cough and breathing problems. The cough was getting really deep. She was also given some tessalon perles but is having a lot of coughing. Got back late Thursday night from [place]. Tried to sleep but cannot due to coughing. She is also out of her albuterol inhaler and does not know where hers is. She is still taking singulair and xyzal and dymista. She is having some problems breathing and coughing fits which she cannot stop.   Review of Systems  Constitutional: Positive for activity change and fatigue. Negative for appetite change, chills and fever.  HENT: Negative for congestion, ear discharge, ear pain, postnasal drip, rhinorrhea, sinus pain, sinus pressure, sore throat and trouble swallowing.   Eyes: Negative.   Respiratory: Positive for cough, chest tightness and shortness of breath. Negative for wheezing.   Cardiovascular: Negative for chest pain, palpitations and leg swelling.  Gastrointestinal: Negative.   Musculoskeletal: Negative.       Objective:   Physical Exam  Constitutional: She is oriented to person, place, and time. She appears well-developed and well-nourished.  HENT:  Head: Normocephalic and atraumatic.  Right Ear: External ear normal.  Left Ear: External ear normal.  Oropharynx with redness, no drainage, nose without crusting.   Eyes: EOM are normal.  Neck: Normal range of motion.  Cardiovascular: Normal rate and regular rhythm.   Pulmonary/Chest: Effort normal. No respiratory distress. She has wheezes. She has no rales.  Scattered wheezing which does not clear with coughing.   Abdominal: Soft.  Lymphadenopathy:    She has no cervical adenopathy.    Neurological: She is alert and oriented to person, place, and time.  Skin: Skin is warm and dry.   Vitals:   12/23/16 0928  BP: (!) 148/78  Pulse: 64  Temp: 97.5 F (36.4 C)  TempSrc: Oral  SpO2: 96%  Weight: 169 lb 6.4 oz (76.8 kg)      Assessment & Plan:

## 2016-12-23 NOTE — Patient Instructions (Signed)
We have given you the cough syrup today that you can use.   We have sent in prednisone which you should take 2 pills a day for 5 days then stop.  We have also sent in the antibiotic called doxycycline which is 1 pill twice a day for 7 days.  We have sent in the refill of the albuterol inhaler.

## 2016-12-23 NOTE — Assessment & Plan Note (Signed)
Refill of her albuterol inhaler, hycodan cough syrup, prednisone and doxycycline. It is unclear exactly what treatment she underwent in [place] but she thinks she was given ampicillin but she has allergy to amoxicillin. She did tolerate this okay but she is not sure about the name of the medicine.

## 2016-12-26 ENCOUNTER — Ambulatory Visit: Payer: Commercial Managed Care - HMO

## 2016-12-29 ENCOUNTER — Ambulatory Visit (INDEPENDENT_AMBULATORY_CARE_PROVIDER_SITE_OTHER): Payer: Medicare HMO | Admitting: Internal Medicine

## 2016-12-29 ENCOUNTER — Encounter: Payer: Self-pay | Admitting: Internal Medicine

## 2016-12-29 VITALS — BP 124/76 | HR 66 | Temp 98.1°F | Resp 20 | Wt 170.0 lb

## 2016-12-29 DIAGNOSIS — H8110 Benign paroxysmal vertigo, unspecified ear: Secondary | ICD-10-CM

## 2016-12-29 DIAGNOSIS — R5383 Other fatigue: Secondary | ICD-10-CM | POA: Insufficient documentation

## 2016-12-29 DIAGNOSIS — R7302 Impaired glucose tolerance (oral): Secondary | ICD-10-CM

## 2016-12-29 DIAGNOSIS — Z0001 Encounter for general adult medical examination with abnormal findings: Secondary | ICD-10-CM | POA: Diagnosis not present

## 2016-12-29 DIAGNOSIS — G4733 Obstructive sleep apnea (adult) (pediatric): Secondary | ICD-10-CM | POA: Diagnosis not present

## 2016-12-29 DIAGNOSIS — E2839 Other primary ovarian failure: Secondary | ICD-10-CM

## 2016-12-29 DIAGNOSIS — I10 Essential (primary) hypertension: Secondary | ICD-10-CM

## 2016-12-29 MED ORDER — BUDESONIDE-FORMOTEROL FUMARATE 160-4.5 MCG/ACT IN AERO: 2.0000 | INHALATION_SPRAY | Freq: Two times a day (BID) | RESPIRATORY_TRACT | 3 refills | Status: DC

## 2016-12-29 MED ORDER — HYDROCHLOROTHIAZIDE 12.5 MG PO CAPS: 12.5000 mg | ORAL_CAPSULE | Freq: Every day | ORAL | 3 refills | Status: DC

## 2016-12-29 MED ORDER — POTASSIUM CHLORIDE ER 10 MEQ PO TBCR: 10.0000 meq | EXTENDED_RELEASE_TABLET | Freq: Every day | ORAL | 3 refills | Status: DC

## 2016-12-29 NOTE — Assessment & Plan Note (Signed)
Ok for pulm referral

## 2016-12-29 NOTE — Assessment & Plan Note (Addendum)

## 2016-12-29 NOTE — Patient Instructions (Addendum)
Ok to decrease the HCT to 12.5 mg per day  OK to reduce the potassium from 20 to 10 meq per day  Please take all new medication as prescribed - the symbicort if ok with your insurance  Please continue all other medications as before, and refills have been done if requested.  Please have the pharmacy call with any other refills you may need.  Please continue your efforts at being more active, low cholesterol diet, and weight control.  You are otherwise up to date with prevention measures today.  You will be contacted regarding the referral for: pulmonary  Please keep your appointments with your specialists as you may have planned  Please schedule the bone density test before leaving today at the scheduling desk (where you check out)  Please return in 1 year for your yearly visit, or sooner if needed, with Lab testing done 3-5 days before

## 2016-12-29 NOTE — Progress Notes (Signed)
Pre visit review using our clinic review tool, if applicable. No additional management support is needed unless otherwise documented below in the visit note.

## 2016-12-29 NOTE — Assessment & Plan Note (Signed)
Declines neuro referral, meclizine, valium, PT eval.

## 2016-12-29 NOTE — Assessment & Plan Note (Addendum)
Asympt, stable overall by history and exam, recent data reviewed with pt, and pt to continue medical treatment as before,  to f/u any worsening symptoms or concerns Lab Results  Component Value Date   HGBA1C 5.8 12/01/2016

## 2016-12-29 NOTE — Progress Notes (Signed)
Subjective:    Patient ID: Maria [name], female    DOB: November 15, 1941, 76 y.o.   MRN: [id number]  HPI Here for wellness and f/u;  Overall doing ok;  Pt denies Chest pain, worsening SOB, DOE, wheezing, orthopnea, PND, worsening LE edema, palpitations, dizziness or syncope.  Pt denies neurological change such as new headache, facial or extremity weakness.  Pt denies polydipsia, polyuria, or low sugar symptoms. Pt states overall good compliance with treatment and medications, good tolerability, and has been trying to follow appropriate diet.  Pt denies worsening depressive symptoms, suicidal ideation or panic. No fever, night sweats, wt loss, loss of appetite, or other constitutional symptoms.  Pt states good ability with ADL's, has low fall risk, home safety reviewed and adequate, no other significant changes in hearing or vision, and only occasionally active with exercise, more active with walking in warmer weather months.  Hard to lose wt.  C/o more thirsty, fatigued and has questions about DM since so much in her family but denies polydipsia and polyuria, and is not using her CPAP for 2 yrs.   Asks for referral back to pulmonary  Also BPV leads to more dizzy more on left recently, hard to turn the head in certain directions.  Driving is ok. Has not tolerated meclizine in the past, Declines vestib rehab, valium or neuro referral   Just back from [place] vacation jan 5, with cough/bronchitis, tx in [place] with antibiotic and steroid shot. No oral meds, all mostly better, already seen in f/u here last sat., now improved with oral antibx and prednisone.  Breathing much improved. Past Medical History:  Diagnosis Date  . Anemia   . Arthritis   . Carpal tunnel syndrome   . Diverticulosis   . GERD (gastroesophageal reflux disease)   . Headache(784.0)    chronic  . High cholesterol   . Hyperlipidemia   . Hypertension   . Migraine   . Nephritis   . OSA (obstructive sleep apnea)   . Palpitations     . Personal history of colonic adenomas 09/15/2010  . TIA (transient ischemic attack)   . Vertigo, benign paroxysmal    Past Surgical History:  Procedure Laterality Date  . BLADDER REPAIR    . BREAST BIOPSY     rt benign cyst  . CARPAL TUNNEL RELEASE     x 2  . COLONOSCOPY    . REPLACEMENT TOTAL KNEE BILATERAL    . TONSILLECTOMY    . UPPER GASTROINTESTINAL ENDOSCOPY      reports that she quit smoking about 47 years ago. Her smoking use included Cigarettes. She has a 10.00 pack-year smoking history. She has never used smokeless tobacco. She reports that she drinks alcohol. She reports that she does not use drugs. family history includes Aneurysm in her father; Chronic bronchitis in her mother; Colon cancer in her maternal grandmother; Colon polyps in her mother; Diabetes in her brother and father; Emphysema in her maternal grandfather; Heart disease in her mother; Hypertension in her father; Irritable bowel syndrome in her daughter; Kidney disease in her father. Allergies  Allergen Reactions  . Amoxicillin Hives  . Ace Inhibitors Other (See Comments)    unknown  . Bee Venom   . Benicar [Olmesartan Medoxomil] Other (See Comments)    Tongue swelling  . Codeine     REACTION: nausea  . Lisinopril Other (See Comments)    unknown  . Macrobid [Nitrofurantoin Monohyd Macro]     Pulmonary Fibrosis  . Nutritional  Supplements     Bee stings cause angioedema-carry an [facility]   Current Outpatient Prescriptions on File Prior to Visit  Medication Sig Dispense Refill  . albuterol (PROAIR HFA) 108 (90 Base) MCG/ACT inhaler INHALE 2 PUFFS INTO THE LUNCH EVERY 6 HOURS AS NEEDED FOR WHEEZING OR SHORTNESS OF BREATH 8.5 Inhaler 2  . amLODipine (NORVASC) 5 MG tablet Take 1 tablet (5 mg total) by mouth daily. 90 tablet 3  . aspirin 81 MG tablet Take 81 mg by mouth daily.    . Azelastine-Fluticasone (DYMISTA) 137-50 MCG/ACT SUSP Place 1 puff into the nose 2 (two) times daily. 6 g 0  . b complex  vitamins tablet Take 1 tablet by mouth daily.      Marland Kitchen CALCIUM PO Take by mouth daily.      . Cholecalciferol (VITAMIN D-3 PO) Take by mouth daily.      . clopidogrel (PLAVIX) 75 MG tablet Take 1 tablet (75 mg total) by mouth daily. Yearly physical due in Dec must see MD for refills 90 tablet 1  . doxycycline (VIBRA-TABS) 100 MG tablet Take 1 tablet (100 mg total) by mouth 2 (two) times daily. 14 tablet 0  . HYDROcodone-homatropine (HYCODAN) 5-1.5 MG/5ML syrup Take 5 mLs by mouth every 8 (eight) hours as needed for cough. 120 mL 0  . levocetirizine (XYZAL) 5 MG tablet Take 1 tablet (5 mg total) by mouth every evening. 90 tablet 3  . magnesium oxide (MAG-OX) 400 MG tablet Take 400 mg by mouth daily.    . montelukast (SINGULAIR) 10 MG tablet TAKE 1 TABLET AT BEDTIME 90 tablet 1  . pantoprazole (PROTONIX) 40 MG tablet Take 1 tablet (40 mg total) by mouth 2 (two) times daily before a meal. Breakfast and supper 60 tablet 3  . predniSONE (DELTASONE) 20 MG tablet Take 2 tablets (40 mg total) by mouth daily with breakfast. 10 tablet 0  . simvastatin (ZOCOR) 10 MG tablet Take 1 tablet (10 mg total) by mouth daily at 6 PM. Yearly physical is due in Dec. Must see md for future refills 90 tablet 1   No current facility-administered medications on file prior to visit.     Review of Systems Constitutional: Negative for increased diaphoresis, or other activity, appetite or siginficant weight change other than noted HENT: Negative for worsening hearing loss, ear pain, facial swelling, mouth sores and neck stiffness.   Eyes: Negative for other worsening pain, redness or visual disturbance.  Respiratory: Negative for choking or stridor Cardiovascular: Negative for other chest pain and palpitations.  Gastrointestinal: Negative for worsening diarrhea, blood in stool, or abdominal distention Genitourinary: Negative for hematuria, flank pain or change in urine volume.  Musculoskeletal: Negative for myalgias or other  joint complaints.  Skin: Negative for other color change and wound or drainage.  Neurological: Negative for syncope and numbness. other than noted Hematological: Negative for adenopathy. or other swelling Psychiatric/Behavioral: Negative for hallucinations, SI, self-injury, decreased concentration or other worsening agitation.  All other system neg per pt    Objective:   Physical Exam BP 124/76   Pulse 66   Temp 98.1 F (36.7 C) (Oral)   Resp 20   Wt 170 lb (77.1 kg)   SpO2 95%   BMI 31.09 kg/m  VS noted, not ill appearing Constitutional: Pt is oriented to person, place, and time. Appears well-developed and well-nourished, in no significant distress Head: Normocephalic and atraumatic  Eyes: Conjunctivae and EOM are normal. Pupils are equal, round, and reactive to light  Right Ear: External ear normal.  Left Ear: External ear normal Nose: Nose normal.  Mouth/Throat: Oropharynx is clear and moist  Neck: Normal range of motion. Neck supple. No JVD present. No tracheal deviation present or significant neck LA or mass Cardiovascular: Normal rate, regular rhythm, normal heart sounds and intact distal pulses.   Pulmonary/Chest: Effort normal and breath sounds without rales or wheezing  Abdominal: Soft. Bowel sounds are normal. NT. No HSM  Musculoskeletal: Normal range of motion. Exhibits no edema Lymphadenopathy: Has no cervical adenopathy.  Neurological: Pt is alert and oriented to person, place, and time. Pt has normal reflexes. No cranial nerve deficit. Motor grossly intact Skin: Skin is warm and dry. No rash noted or new ulcers Psychiatric:  Has normal mood and affect. Behavior is normal.  No other new exam findings  Lab Results  Component Value Date   WBC 7.3 12/01/2016   HGB 14.2 12/01/2016   HCT 42.5 12/01/2016   PLT 268.0 12/01/2016   GLUCOSE 104 (H) 12/01/2016   CHOL 137 12/01/2016   TRIG 52.0 12/01/2016   HDL 60.30 12/01/2016   LDLDIRECT 113.2 10/23/2013   LDLCALC 66  12/01/2016   ALT 17 12/01/2016   AST 20 12/01/2016   [name] 140 12/01/2016   K 3.4 (L) 12/01/2016   CL 102 12/01/2016   CREATININE 0.71 12/01/2016   BUN 13 12/01/2016   CO2 30 12/01/2016   TSH 3.14 12/01/2016   INR 0.96 01/31/2011   HGBA1C 5.8 12/01/2016       Assessment & Plan:

## 2016-12-29 NOTE — Assessment & Plan Note (Signed)
stable overall by history and exam, recent data reviewed with pt, and pt to continue medical treatment as before,  to f/u any worsening symptoms or concerns BP Readings from Last 3 Encounters:  12/29/16 124/76  12/23/16 (!) 148/78  10/13/16 115/61

## 2017-01-01 ENCOUNTER — Other Ambulatory Visit: Payer: Self-pay

## 2017-01-01 NOTE — Telephone Encounter (Signed)
Pt lvmm rq rf to [facility] order. Please contact pt when these have been sent per pt rq.

## 2017-01-02 ENCOUNTER — Telehealth: Payer: Self-pay

## 2017-01-02 MED ORDER — MONTELUKAST SODIUM 10 MG PO TABS: 10.0000 mg | ORAL_TABLET | Freq: Every day | ORAL | 3 refills | Status: DC

## 2017-01-02 MED ORDER — AMLODIPINE BESYLATE 5 MG PO TABS: 5.0000 mg | ORAL_TABLET | Freq: Every day | ORAL | 3 refills | Status: DC

## 2017-01-02 MED ORDER — CLOPIDOGREL BISULFATE 75 MG PO TABS: 75.0000 mg | ORAL_TABLET | Freq: Every day | ORAL | 3 refills | Status: DC

## 2017-01-02 MED ORDER — SIMVASTATIN 10 MG PO TABS: 10.0000 mg | ORAL_TABLET | Freq: Every day | ORAL | 3 refills | Status: DC

## 2017-01-02 NOTE — Telephone Encounter (Signed)
Left message asking patient to call back and let us know which meds she is wanting refilled from [place] mail order----let Alonso Gapinski know what meds and I will check on refills

## 2017-01-02 NOTE — Telephone Encounter (Signed)
Pt left msg on triage stating saw MD for her physical was told refills would be sent to [facility], but only 3 rx's has been sent. Pt needing all meds sent to [facility]. Updated list sent all maintenance med to [facility]...Johny Chess

## 2017-01-05 ENCOUNTER — Other Ambulatory Visit: Payer: Self-pay

## 2017-01-05 MED ORDER — LEVOCETIRIZINE DIHYDROCHLORIDE 5 MG PO TABS: 5.0000 mg | ORAL_TABLET | Freq: Every evening | ORAL | 3 refills | Status: DC

## 2017-02-07 ENCOUNTER — Ambulatory Visit: Payer: Medicare HMO

## 2017-02-07 ENCOUNTER — Ambulatory Visit
Admission: RE | Admit: 2017-02-07 | Discharge: 2017-02-07 | Disposition: A | Discharge status: Home / Self Care | Payer: Medicare HMO | Source: Ambulatory Visit | Attending: Internal Medicine | Admitting: Internal Medicine

## 2017-02-07 DIAGNOSIS — Z780 Asymptomatic menopausal state: Secondary | ICD-10-CM | POA: Diagnosis not present

## 2017-02-07 DIAGNOSIS — Z1231 Encounter for screening mammogram for malignant neoplasm of breast: Secondary | ICD-10-CM

## 2017-02-07 DIAGNOSIS — E2839 Other primary ovarian failure: Secondary | ICD-10-CM

## 2017-02-07 DIAGNOSIS — M8589 Other specified disorders of bone density and structure, multiple sites: Secondary | ICD-10-CM | POA: Diagnosis not present

## 2017-02-15 ENCOUNTER — Telehealth: Payer: Self-pay | Admitting: Internal Medicine

## 2017-02-15 NOTE — Telephone Encounter (Signed)
Pt called request bone density and mammogram result. Please call her back.

## 2017-02-16 NOTE — Telephone Encounter (Signed)
Called pt. Informed results were on my chart. Also verbally gave pt results that PCP typed.

## 2017-02-26 ENCOUNTER — Institutional Professional Consult (permissible substitution): Payer: Medicare HMO | Admitting: Pulmonary Disease

## 2017-02-27 ENCOUNTER — Ambulatory Visit (INDEPENDENT_AMBULATORY_CARE_PROVIDER_SITE_OTHER): Payer: Medicare HMO | Admitting: Pulmonary Disease

## 2017-02-27 ENCOUNTER — Encounter: Payer: Self-pay | Admitting: Pulmonary Disease

## 2017-02-27 ENCOUNTER — Ambulatory Visit (INDEPENDENT_AMBULATORY_CARE_PROVIDER_SITE_OTHER)
Admission: RE | Admit: 2017-02-27 | Discharge: 2017-02-27 | Disposition: A | Discharge status: Home / Self Care | Payer: Medicare HMO | Source: Ambulatory Visit | Attending: Pulmonary Disease | Admitting: Pulmonary Disease

## 2017-02-27 VITALS — BP 122/76 | HR 74 | Ht 62.0 in | Wt 176.2 lb

## 2017-02-27 DIAGNOSIS — G4733 Obstructive sleep apnea (adult) (pediatric): Secondary | ICD-10-CM | POA: Diagnosis not present

## 2017-02-27 DIAGNOSIS — K219 Gastro-esophageal reflux disease without esophagitis: Secondary | ICD-10-CM | POA: Diagnosis not present

## 2017-02-27 DIAGNOSIS — R0602 Shortness of breath: Secondary | ICD-10-CM | POA: Diagnosis not present

## 2017-02-27 DIAGNOSIS — J849 Interstitial pulmonary disease, unspecified: Secondary | ICD-10-CM

## 2017-02-27 DIAGNOSIS — R0609 Other forms of dyspnea: Secondary | ICD-10-CM | POA: Diagnosis not present

## 2017-02-27 DIAGNOSIS — R05 Cough: Secondary | ICD-10-CM | POA: Diagnosis not present

## 2017-02-27 MED ORDER — BUDESONIDE-FORMOTEROL FUMARATE 160-4.5 MCG/ACT IN AERO: 2.0000 | INHALATION_SPRAY | Freq: Two times a day (BID) | RESPIRATORY_TRACT | 0 refills | Status: DC

## 2017-02-27 NOTE — Assessment & Plan Note (Addendum)
Pt with exertional dyspnea through the years, more significant the last 6 months. Gets dyspneic with usual ADLs. She saw Dr. Annamaria Boots  years ago for her allergies and was placed on singulair and xyzal.  PFTs in 2016 were Normal.  CXR in 2015 was normal.  On my exam, she had crackles mid-bases.   CXR on 02/27/2017 with possible reticular changes at the bases.  Likely with ILD. Will need HRCT and PFT.  Pt was given Symbicort for "asthma" recently. I asked pt to try symbicort for 2 weeks or so and see if it makes a difference.  Holding off on further Rx and blood work for now pending ct scan.  If with ILD, may need to start meds for GERD again.

## 2017-02-27 NOTE — Patient Instructions (Addendum)
It was a pleasure taking care of you today!  We will schedule you to have a sleep study to determine if you have sleep apnea.    We will get a lab sleep study.  You will be scheduled to have a lab sleep study in 4-6 weeks.  Someone from the sleep lab will call you in 2-3 days to schedule the study with you.  They usually have cancellations every night so most likely, they will have openings for a lab sleep study next week or so.  We encourage you to do your sleep study then if possible. Please give [place] a call in a week is no one from the sleep lab calls you in 2-3 days.   If the sleep study is positive, we will order you a CPAP  machine.  Please call the office if you do NOT receive your machine in the next 1-2 weeks.   Please make sure you use your CPAP device everytime you sleep.  We will monitor the usage of your machine per your insurance requirement.  Your insurance company may take the machine from you if you are not using it regularly.   Please clean the mask, tubings, filter, water reservoir with soapy water every week.  Please use distilled water for the water reservoir.   Please call the office or your machine provider (DME company) if you are having issues with the device.   We will also schedule you with chest ct scan and breathing test.   Return to clinic in 6-8  weeks with Dr. Corrie Dandy or NP

## 2017-02-27 NOTE — Assessment & Plan Note (Signed)
patient was diagnosed with OSA in 2008. Patient had snoring, gasping, choking, frequent awakenings. She had a sleep study in the lab which showed OSA (mild-moderate).  She was started on cpap. Last study was in 2012.  She needed cpap at 9 cm water.    She used cpap.  She had several issues with CPAP but despite those issues, she used CPAP from 2008 until 2015. In 2015, her machine broke and she was not able to get a new one.  She felt the air was too cold with her previous cpap. She is also very claustrophobic and couldn't tolerate full face mask but that is the only mass that she use. Overall, she was just uncomfortable with it.  Patient's sx have gotten worse through the years. She has snoring, gasping, choking, has unrefreshed sleep.  Sleeps 6-8 hrs/night. Naps 3x/week with 61 yo grandson.  Wakes up unrefreshed in pm. Still feels tired all day despite sleeping.   ESS 16.   Plan :  We discussed about the diagnosis of Obstructive Sleep Apnea (OSA) and implications of untreated OSA. We discussed about CPAP and BiPaP as possible treatment options.    We will schedule the patient for a sleep study. Plan for a split night. Need to make sure it is heated humidifier.  She has claustrophobia and had issues with full face mask. Likely will need a mask fitting session. She wants to try nasal pillows or nasal mass. If mild, likely will hold off on treatment but she is very symptomatic.  Has " irregular HR" and h/o TIA.    Patient was instructed to call the office if he/she has not heard back from the office 1-2 weeks after the sleep study.   Patient was instructed to call the office if he/she is having issues with the PAP device.   We discussed good sleep hygiene.   Patient was advised not to engage in activities requiring concentration and/or vigilance if he/she is sleepy.  Patient was advised not to drive if he/she is sleepy.

## 2017-02-27 NOTE — Assessment & Plan Note (Signed)
Pt with GERD. Not controlled with meds so pt switched to "natural medicine" Diet changes.  May need to switch to PPI again depending on ct scan

## 2017-02-27 NOTE — Progress Notes (Signed)
Subjective:    Patient ID: Maria [name], female    DOB: [birth year], 76 y.o.   MRN: [id number]  HPI   This is the case of Maria [name], 76 y.o. Female, who was referred by Dr. Cathlean [name]  in consultation regarding OSA.   As you very well know, patient was with OSA in 2008. Patient had snoring, gasping, choking, frequent awakenings. She had a sleep study in the lab which showed OSA (mild-moderate).  She was started on cpap. Last study was in 2012.  She needed cpap at 9 cm water.    She used cpap.  She had several issues with CPAP but despite those issues, she used CPAP from 2008 until 2015. In 2015, her machine broke and she was not able to get a new one.  She felt the air was too cold with her previous cpap. She is also very claustrophobic and couldn't tolerate full face mask but that is the only mass that she use. Overall, she was just uncomfortable with it.  Patient's sx have gotten worse through the years. She has snoring, gasping, choking, has unrefreshed sleep.  Sleeps 6-8 hrs/night. Naps 3x/week with 44 yo grandson.  Wakes up unrefreshed in pm. Still feels tired all day despite sleeping.   ESS 16.   She has allergies for which she takes singulair and xyzal. She has wheezing and congestion for several yrs. She has more SOB the last 6 mos.   Started on symbicort recently 2/2 concern for asthma by PCP. (-) cp, cough, fevers, chills. She has GERD for which she has to sleep with 4 pillows.  Her GERD is not controlled with PPI. She uses natural medicine.   Review of Systems  Constitutional: Negative.  Negative for fever and unexpected weight change.  HENT: Negative.  Negative for congestion, dental problem, ear pain, nosebleeds, postnasal drip, rhinorrhea, sinus pressure, sneezing, sore throat and trouble swallowing.   Eyes: Negative.  Negative for redness and itching.  Respiratory: Positive for chest tightness and shortness of breath. Negative for cough and wheezing.     Cardiovascular: Negative.  Negative for palpitations and leg swelling.  Gastrointestinal: Negative.  Negative for nausea and vomiting.  Endocrine: Negative.   Genitourinary: Negative.  Negative for dysuria.  Musculoskeletal: Negative.  Negative for joint swelling.  Skin: Negative.  Negative for rash.  Allergic/Immunologic: Negative.  Negative for environmental allergies, food allergies and immunocompromised state.  Neurological: Positive for dizziness. Negative for headaches.  Hematological: Bruises/bleeds easily.  Psychiatric/Behavioral: Negative.  Negative for dysphoric mood. The patient is not nervous/anxious.    Past Medical History:  Diagnosis Date  . Anemia   . Arthritis   . Carpal tunnel syndrome   . Diverticulosis   . GERD (gastroesophageal reflux disease)   . Headache(784.0)    chronic  . High cholesterol   . Hyperlipidemia   . Hypertension   . Migraine   . Nephritis   . OSA (obstructive sleep apnea)   . Palpitations   . Personal history of colonic adenomas 09/15/2010  . TIA (transient ischemic attack)   . Vertigo, benign paroxysmal      Family History  Problem Relation Age of Onset  . Diabetes Father   . Aneurysm Father   . Kidney disease Father   . Hypertension Father   . Heart disease Mother   . Colon polyps Mother   . Chronic bronchitis Mother   . Diabetes Brother   . Colon cancer Maternal Grandmother   .  Irritable bowel syndrome Daughter   . Emphysema Maternal Grandfather     smoked a pipe     Past Surgical History:  Procedure Laterality Date  . BLADDER REPAIR    . BREAST BIOPSY     rt benign cyst  . CARPAL TUNNEL RELEASE     x 2  . COLONOSCOPY    . REPLACEMENT TOTAL KNEE BILATERAL    . TONSILLECTOMY    . UPPER GASTROINTESTINAL ENDOSCOPY      Social History   Social History  . Marital status: Divorced    Spouse name: N/A  . Number of children: 3  . Years of education: N/A   Occupational History  . retired    Social History Main  Topics  . Smoking status: Former Smoker    Packs/day: 1.00    Years: 10.00    Types: Cigarettes    Quit date: 12/18/1969  . Smokeless tobacco: Never Used  . Alcohol use Yes     Comment: ocassional use  . Drug use: No  . Sexual activity: Not on file   Other Topics Concern  . Not on file   Social History Narrative  . 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  Lives in [place].   Allergies  Allergen Reactions  . Amoxicillin Hives  . Ace Inhibitors Other (See Comments)    unknown  . Bee Venom   . Benicar [Olmesartan Medoxomil] Other (See Comments)    Tongue swelling  . Codeine     REACTION: nausea  . Lisinopril Other (See Comments)    unknown  . Macrobid [Nitrofurantoin Monohyd Macro]     Pulmonary Fibrosis  . Nutritional Supplements     Bee stings cause angioedema-carry an EPIPEN     Outpatient Medications Prior to Visit  Medication Sig Dispense Refill  . albuterol (PROAIR HFA) 108 (90 Base) MCG/ACT inhaler INHALE 2 PUFFS INTO THE LUNCH EVERY 6 HOURS AS NEEDED FOR WHEEZING OR SHORTNESS OF BREATH 8.5 Inhaler 2  . amLODipine (NORVASC) 5 MG tablet Take 1 tablet (5 mg total) by mouth daily. 90 tablet 3  . aspirin 81 MG tablet Take 81 mg by mouth daily.    Marland Kitchen b complex vitamins tablet Take 1 tablet by mouth daily.      Marland Kitchen CALCIUM PO Take by mouth daily.      . Cholecalciferol (VITAMIN D-3 PO) Take by mouth daily.      . clopidogrel (PLAVIX) 75 MG tablet Take 1 tablet (75 mg total) by mouth daily. 90 tablet 3  . hydrochlorothiazide (MICROZIDE) 12.5 MG capsule Take 1 capsule (12.5 mg total) by mouth daily. 90 capsule 3  . levocetirizine (XYZAL) 5 MG tablet Take 1 tablet (5 mg total) by mouth every evening. 90 tablet 3  . magnesium oxide (MAG-OX) 400 MG tablet Take 400 mg by mouth daily.    . montelukast (SINGULAIR) 10 MG tablet Take 1 tablet (10 mg total) by mouth at bedtime. 90 tablet 3  . potassium chloride (KLOR-CON 10) 10 MEQ tablet Take 1 tablet (10 mEq total) by  mouth daily. 90 tablet 3  . simvastatin (ZOCOR) 10 MG tablet Take 1 tablet (10 mg total) by mouth daily at 6 PM. 90 tablet 3  . Azelastine-Fluticasone (DYMISTA) 137-50 MCG/ACT SUSP Place 1 puff into the nose 2 (two) times daily. (Patient not taking: Reported on 02/27/2017) 6 g 0  . budesonide-formoterol (SYMBICORT) 160-4.5 MCG/ACT inhaler Inhale 2 puffs into the lungs 2 (  two) times daily. (Patient not taking: Reported on 02/27/2017) 3 Inhaler 3  . HYDROcodone-homatropine (HYCODAN) 5-1.5 MG/5ML syrup Take 5 mLs by mouth every 8 (eight) hours as needed for cough. (Patient not taking: Reported on 02/27/2017) 120 mL 0  . pantoprazole (PROTONIX) 40 MG tablet Take 1 tablet (40 mg total) by mouth 2 (two) times daily before a meal. Breakfast and supper (Patient not taking: Reported on 02/27/2017) 60 tablet 3  . doxycycline (VIBRA-TABS) 100 MG tablet Take 1 tablet (100 mg total) by mouth 2 (two) times daily. (Patient not taking: Reported on 02/27/2017) 14 tablet 0  . predniSONE (DELTASONE) 20 MG tablet Take 2 tablets (40 mg total) by mouth daily with breakfast. (Patient not taking: Reported on 02/27/2017) 10 tablet 0   No facility-administered medications prior to visit.    Meds ordered this encounter  Medications  . budesonide-formoterol (SYMBICORT) 160-4.5 MCG/ACT inhaler    Sig: Inhale 2 puffs into the lungs 2 (two) times daily.    Dispense:  1 Inhaler    Refill:  0    Order Specific Question:   Lot Number?    Answer:   [id number]    Order Specific Question:   Expiration Date?    Answer:   01/18/2018    Order Specific Question:   Quantity    Answer:   1        Objective:   Physical Exam  Vitals:  Vitals:   02/27/17 1412  BP: 122/76  Pulse: 74  SpO2: 94%  Weight: 176 lb 3.2 oz (79.9 kg)  Height: _0  (1.575 m)    Constitutional/General:  Pleasant, well-nourished, well-developed, not in any distress,  Comfortably seating.  Well kempt  Body mass index is 32.23 kg/m. Wt Readings from Last  3 Encounters:  02/27/17 176 lb 3.2 oz (79.9 kg)  12/29/16 170 lb (77.1 kg)  12/23/16 169 lb 6.4 oz (76.8 kg)    Neck circumference:   HEENT: Pupils equal and reactive to light and accommodation. Anicteric sclerae. Normal nasal mucosa.   No oral  lesions,  mouth clear,  oropharynx clear, no postnasal drip. (-) Oral thrush. No dental caries.  Airway - Mallampati class III  Neck: No masses. Midline trachea. No JVD, (-) LAD. (-) bruits appreciated.  Respiratory/Chest: Grossly normal chest. (-) deformity. (-) Accessory muscle use.  Symmetric expansion. (-) Tenderness on palpation.  Resonant on percussion.  Diminished BS on both lower lung zones. (-) wheezing, rhonchi Has crackles mid-base.  (-) egophony  Cardiovascular: Regular rate and  rhythm, heart sounds normal, no murmur or gallops, no peripheral edema  Gastrointestinal:  Normal bowel sounds. Soft, non-tender. No hepatosplenomegaly.  (-) masses.   Musculoskeletal:  Normal muscle tone. Normal gait.   Extremities: Grossly normal. (-) clubbing, cyanosis.  (-) edema  Skin: (-) rash,lesions seen.   Neurological/Psychiatric : alert, oriented to time, place, person. Normal mood and affect          Assessment & Plan:  Obstructive sleep apnea patient was diagnosed with OSA in 2008. Patient had snoring, gasping, choking, frequent awakenings. She had a sleep study in the lab which showed OSA (mild-moderate).  She was started on cpap. Last study was in 2012.  She needed cpap at 9 cm water.    She used cpap.  She had several issues with CPAP but despite those issues, she used CPAP from 2008 until 2015. In 2015, her machine broke and she was not able to get a new one.  She felt  the air was too cold with her previous cpap. She is also very claustrophobic and couldn't tolerate full face mask but that is the only mass that she use. Overall, she was just uncomfortable with it.  Patient's sx have gotten worse through the years. She  has snoring, gasping, choking, has unrefreshed sleep.  Sleeps 6-8 hrs/night. Naps 3x/week with 54 yo grandson.  Wakes up unrefreshed in pm. Still feels tired all day despite sleeping.   ESS 16.   Plan :  We discussed about the diagnosis of Obstructive Sleep Apnea (OSA) and implications of untreated OSA. We discussed about CPAP and BiPaP as possible treatment options.    We will schedule the patient for a sleep study. Plan for a split night. Need to make sure it is heated humidifier.  She has claustrophobia and had issues with full face mask. Likely will need a mask fitting session. She wants to try nasal pillows or nasal mass. If mild, likely will hold off on treatment but she is very symptomatic.  Has " irregular HR" and h/o TIA.    Patient was instructed to call the office if he/she has not heard back from the office 1-2 weeks after the sleep study.   Patient was instructed to call the office if he/she is having issues with the PAP device.   We discussed good sleep hygiene.   Patient was advised not to engage in activities requiring concentration and/or vigilance if he/she is sleepy.  Patient was advised not to drive if he/she is sleepy.    Exertional dyspnea  Pt with exertional dyspnea through the years, more significant the last 6 months. Gets dyspneic with usual ADLs. She saw Dr. Annamaria Boots  years ago for her allergies and was placed on singulair and xyzal.  PFTs in 2016 were Normal.  CXR in 2015 was normal.  On my exam, she had crackles mid-bases.   CXR on 02/27/2017 with possible reticular changes at the bases.  Likely with ILD. Will need HRCT and PFT.  Pt was given Symbicort for "asthma" recently. I asked pt to try symbicort for 2 weeks or so and see if it makes a difference.  Holding off on further Rx and blood work for now pending ct scan.  If with ILD, may need to start meds for GERD again.   GERD (gastroesophageal reflux disease) Pt with GERD. Not controlled with meds so pt  switched to "natural medicine" Diet changes.  May need to switch to PPI again depending on ct scan     Thank you very much for letting me participate in this patient's care. Please do not hesitate to give me a call if you have any questions or concerns regarding the treatment plan.   Patient will follow up with me in 6-8 weeks (f/u for osa and SOB).     Monica Becton, MD 02/27/2017   10:33 PM [facility] [place] Pager: [phone] Office: [phone], Fax: [phone]

## 2017-03-05 ENCOUNTER — Inpatient Hospital Stay: Admission: RE | Admit: 2017-03-05 | Payer: Medicare HMO | Source: Ambulatory Visit

## 2017-03-12 ENCOUNTER — Inpatient Hospital Stay: Admission: RE | Admit: 2017-03-12 | Payer: Medicare HMO | Source: Ambulatory Visit

## 2017-03-29 ENCOUNTER — Ambulatory Visit (INDEPENDENT_AMBULATORY_CARE_PROVIDER_SITE_OTHER)
Admission: RE | Admit: 2017-03-29 | Discharge: 2017-03-29 | Disposition: A | Discharge status: Home / Self Care | Payer: Medicare HMO | Source: Ambulatory Visit | Attending: Pulmonary Disease | Admitting: Pulmonary Disease

## 2017-03-29 DIAGNOSIS — J849 Interstitial pulmonary disease, unspecified: Secondary | ICD-10-CM | POA: Diagnosis not present

## 2017-03-29 DIAGNOSIS — R0602 Shortness of breath: Secondary | ICD-10-CM | POA: Diagnosis not present

## 2017-04-03 ENCOUNTER — Encounter: Payer: Commercial Managed Care - HMO | Admitting: Internal Medicine

## 2017-04-06 ENCOUNTER — Other Ambulatory Visit: Payer: Self-pay

## 2017-04-06 DIAGNOSIS — J984 Other disorders of lung: Secondary | ICD-10-CM

## 2017-04-11 ENCOUNTER — Encounter: Payer: Self-pay | Admitting: Family Medicine

## 2017-04-11 ENCOUNTER — Ambulatory Visit (INDEPENDENT_AMBULATORY_CARE_PROVIDER_SITE_OTHER): Payer: Medicare HMO | Admitting: Family Medicine

## 2017-04-11 VITALS — BP 138/86 | HR 72 | Temp 98.3°F | Ht 62.0 in | Wt 176.0 lb

## 2017-04-11 DIAGNOSIS — R338 Other retention of urine: Secondary | ICD-10-CM

## 2017-04-11 LAB — POC URINALSYSI DIPSTICK (AUTOMATED)
Bilirubin, UA: NEGATIVE
Glucose, UA: NEGATIVE
Ketones, UA: NEGATIVE
LEUKOCYTES UA: NEGATIVE
Nitrite, UA: NEGATIVE
PH UA: 6 (ref 5.0–8.0)
Protein, UA: NEGATIVE
RBC UA: NEGATIVE
Spec Grav, UA: 1.025 (ref 1.010–1.025)
UROBILINOGEN UA: 0.2 U/dL

## 2017-04-11 MED ORDER — CIPROFLOXACIN HCL 500 MG PO TABS: 500.0000 mg | ORAL_TABLET | Freq: Two times a day (BID) | ORAL | 0 refills | Status: DC

## 2017-04-11 NOTE — Progress Notes (Signed)
Pre visit review using our clinic review tool, if applicable. No additional management support is needed unless otherwise documented below in the visit note.

## 2017-04-11 NOTE — Patient Instructions (Signed)
WE NOW OFFER   Maria [name]'s FAST TRACK!!!  SAME DAY Appointments for ACUTE CARE  Such as: Sprains, Injuries, cuts, abrasions, rashes, muscle pain, joint pain, back pain Colds, flu, sore throats, headache, allergies, cough, fever  Ear pain, sinus and eye infections Abdominal pain, nausea, vomiting, diarrhea, upset stomach Animal/insect bites  3 Easy Ways to Schedule: Walk-In Scheduling Call in scheduling Mychart Sign-up: [email or web address]

## 2017-04-11 NOTE — Progress Notes (Signed)
   Subjective:    Patient ID: Maria [name], female    DOB: Apr 04, 1941, 76 y.o.   MRN: [id number]  HPI Here for 2 days of urgency to urinate and lower abdominal pressure but without burning. No nausea or back pain or fever. However today she has not passed urine at all, even though she feels the need to. She has a hx of UTIs and usually has troubloe with incontinence. She has had 2 bladder tack surgeries, but both failed after a time. Her BMs are normal.    Review of Systems  Constitutional: Negative.   Gastrointestinal: Positive for abdominal pain. Negative for abdominal distention, anal bleeding, blood in stool, constipation, diarrhea, nausea, rectal pain and vomiting.  Genitourinary: Positive for difficulty urinating and urgency. Negative for dysuria, flank pain, frequency and hematuria.       Objective:   Physical Exam  Constitutional: She appears well-developed and well-nourished. No distress.  Cardiovascular: Normal rate, regular rhythm, normal heart sounds and intact distal pulses.   Pulmonary/Chest: Effort normal and breath sounds normal.  Abdominal: Soft. She exhibits no distension and no mass. There is no rebound and no guarding.  Mildly tender above the pubis          Assessment & Plan:  Urinary pressure and difficulty passing urine. She was able to pass a little today. We will culture the sample. Start on Cipro for 7 days. Refer to Urology for bladder issues.  Alysia Penna, MD

## 2017-04-11 NOTE — Addendum Note (Signed)
Addended by: Aggie Hacker A on: 04/11/2017 12:54 PM   Modules accepted: Orders

## 2017-04-12 ENCOUNTER — Encounter (HOSPITAL_BASED_OUTPATIENT_CLINIC_OR_DEPARTMENT_OTHER): Payer: Medicare HMO

## 2017-04-12 LAB — URINE CULTURE

## 2017-04-17 ENCOUNTER — Encounter: Payer: Self-pay | Admitting: Adult Health

## 2017-04-17 ENCOUNTER — Ambulatory Visit (INDEPENDENT_AMBULATORY_CARE_PROVIDER_SITE_OTHER): Payer: Medicare HMO | Admitting: Pulmonary Disease

## 2017-04-17 ENCOUNTER — Ambulatory Visit (INDEPENDENT_AMBULATORY_CARE_PROVIDER_SITE_OTHER): Payer: Medicare HMO | Admitting: Adult Health

## 2017-04-17 DIAGNOSIS — J849 Interstitial pulmonary disease, unspecified: Secondary | ICD-10-CM | POA: Diagnosis not present

## 2017-04-17 DIAGNOSIS — J8410 Pulmonary fibrosis, unspecified: Secondary | ICD-10-CM

## 2017-04-17 LAB — PULMONARY FUNCTION TEST
DL/VA % pred: 94 %
DL/VA: 4.44 ml/min/mmHg/L
DLCO COR % PRED: 61 %
DLCO COR: 14.06 ml/min/mmHg
DLCO UNC: 14.9 ml/min/mmHg
DLCO unc % pred: 65 %
FEF 25-75 POST: 2.26 L/s
FEF 25-75 PRE: 2.03 L/s
FEF2575-%Change-Post: 11 %
FEF2575-%PRED-PRE: 132 %
FEF2575-%Pred-Post: 147 %
FEV1-%Change-Post: 2 %
FEV1-%PRED-POST: 73 %
FEV1-%PRED-PRE: 71 %
FEV1-POST: 1.45 L
FEV1-Pre: 1.42 L
FEV1FVC-%Change-Post: -1 %
FEV1FVC-%Pred-Pre: 116 %
FEV6-%CHANGE-POST: 4 %
FEV6-%PRED-POST: 67 %
FEV6-%Pred-Pre: 65 %
FEV6-POST: 1.69 L
FEV6-Pre: 1.63 L
FEV6FVC-%PRED-POST: 105 %
FEV6FVC-%Pred-Pre: 105 %
FVC-%Change-Post: 4 %
FVC-%Pred-Post: 64 %
FVC-%Pred-Pre: 61 %
FVC-Post: 1.69 L
FVC-Pre: 1.63 L
PRE FEV1/FVC RATIO: 87 %
Post FEV1/FVC ratio: 86 %
Post FEV6/FVC ratio: 100 %
Pre FEV6/FVC Ratio: 100 %
RV % pred: 79 %
RV: 1.81 L
TLC % PRED: 76 %
TLC: 3.72 L

## 2017-04-17 NOTE — Progress Notes (Signed)
PFT done today.

## 2017-04-27 ENCOUNTER — Telehealth: Payer: Self-pay | Admitting: Pulmonary Disease

## 2017-04-27 NOTE — Assessment & Plan Note (Signed)
Left prior to being seen .

## 2017-04-27 NOTE — Telephone Encounter (Signed)
Pt left on 5.1.18 without being seen by TP LMOM TCB x1 to reschedule appt

## 2017-04-27 NOTE — Progress Notes (Signed)
Pt left prior to be seen . Wants to reschedule .

## 2017-05-28 NOTE — Progress Notes (Signed)
Pre visit review using our clinic review tool, if applicable. No additional management support is needed unless otherwise documented below in the visit note.

## 2017-05-28 NOTE — Progress Notes (Addendum)
Subjective:   Maria [name] is a 76 y.o. female who presents for an Initial Medicare Annual Wellness Visit.  Review of Systems    No ROS.  Medicare Wellness Visit. Additional risk factors are reflected in the social history.  Cardiac Risk Factors include: advanced age (>37mn, >>79women);dyslipidemia Sleep patterns: has frequent nighttime awakenings, feels rested on waking, gets up 2-3 times nightly to void and sleeps 4-5 hours nightly. Has a sleep study appointment, does not wear C-pap, Patient reports insomnia issues, discussed recommended sleep tips and stress reduction tips.  Home Safety/Smoke Alarms: Feels safe in home. Smoke alarms in place.  Living environment; residence and Firearm Safety: 2-story house, no firearms. Lives alone, no needs for DME, has family support Seat Belt Safety/Bike Helmet: Wears seat belt.   Counseling:   Eye Exam- appointment yearly Dental- appointment yearly  Female:   Pap-  N/A     Mammo- Last 02/07/17,  BI-RADS CATEGORY  1: Negative     Dexa scan- Last 02/07/17, osteopenic     CCS- Last 11/20/13, polyps, recall 5 years     Objective:    There were no vitals filed for this visit. There is no height or weight on file to calculate BMI.   Current Medications (verified) Outpatient Encounter Prescriptions as of 05/29/2017  Medication Sig  . albuterol (PROAIR HFA) 108 (90 Base) MCG/ACT inhaler INHALE 2 PUFFS INTO THE LUNCH EVERY 6 HOURS AS NEEDED FOR WHEEZING OR SHORTNESS OF BREATH  . amLODipine (NORVASC) 5 MG tablet Take 1 tablet (5 mg total) by mouth daily.  .Marland Kitchenaspirin 81 MG tablet Take 81 mg by mouth daily.  . Azelastine-Fluticasone (DYMISTA) 137-50 MCG/ACT SUSP Place 1 puff into the nose 2 (two) times daily.  .Marland Kitchenb complex vitamins tablet Take 1 tablet by mouth daily.    . budesonide-formoterol (SYMBICORT) 160-4.5 MCG/ACT inhaler Inhale 2 puffs into the lungs 2 (two) times daily.  . budesonide-formoterol (SYMBICORT) 160-4.5 MCG/ACT inhaler  Inhale 2 puffs into the lungs 2 (two) times daily.  .Marland KitchenCALCIUM PO Take by mouth daily.    . Cholecalciferol (VITAMIN D-3 PO) Take by mouth daily.    . clopidogrel (PLAVIX) 75 MG tablet Take 1 tablet (75 mg total) by mouth daily.  . hydrochlorothiazide (MICROZIDE) 12.5 MG capsule Take 1 capsule (12.5 mg total) by mouth daily.  .Marland Kitchenlevocetirizine (XYZAL) 5 MG tablet Take 1 tablet (5 mg total) by mouth every evening.  . magnesium oxide (MAG-OX) 400 MG tablet Take 400 mg by mouth daily.  . montelukast (SINGULAIR) 10 MG tablet Take 1 tablet (10 mg total) by mouth at bedtime.  . potassium chloride (KLOR-CON 10) 10 MEQ tablet Take 1 tablet (10 mEq total) by mouth daily.  . simvastatin (ZOCOR) 10 MG tablet Take 1 tablet (10 mg total) by mouth daily at 6 PM.  . [DISCONTINUED] ciprofloxacin (CIPRO) 500 MG tablet Take 1 tablet (500 mg total) by mouth 2 (two) times daily. (Patient not taking: Reported on 05/29/2017)  . [DISCONTINUED] HYDROcodone-homatropine (HYCODAN) 5-1.5 MG/5ML syrup Take 5 mLs by mouth every 8 (eight) hours as needed for cough. (Patient not taking: Reported on 05/29/2017)  . [DISCONTINUED] pantoprazole (PROTONIX) 40 MG tablet Take 1 tablet (40 mg total) by mouth 2 (two) times daily before a meal. Breakfast and supper (Patient not taking: Reported on 05/29/2017)   No facility-administered encounter medications on file as of 05/29/2017.     Allergies (verified) Amoxicillin; Ace inhibitors; Bee venom; Benicar [olmesartan medoxomil]; Codeine; Lisinopril;  Macrobid [nitrofurantoin monohyd macro]; and Nutritional supplements   History: Past Medical History:  Diagnosis Date  . Anemia   . Arthritis   . Carpal tunnel syndrome   . Diverticulosis   . GERD (gastroesophageal reflux disease)   . Headache(784.0)    chronic  . High cholesterol   . Hyperlipidemia   . Hypertension   . Migraine   . Nephritis   . OSA (obstructive sleep apnea)   . Palpitations   . Personal history of colonic adenomas  09/15/2010  . TIA (transient ischemic attack)   . Vertigo, benign paroxysmal    Past Surgical History:  Procedure Laterality Date  . BLADDER REPAIR    . BREAST BIOPSY     rt benign cyst  . CARPAL TUNNEL RELEASE     x 2  . COLONOSCOPY    . REPLACEMENT TOTAL KNEE BILATERAL    . TONSILLECTOMY    . UPPER GASTROINTESTINAL ENDOSCOPY     Family History  Problem Relation Age of Onset  . Diabetes Father   . Aneurysm Father   . Kidney disease Father   . Hypertension Father   . Heart disease Mother   . Colon polyps Mother   . Chronic bronchitis Mother   . Diabetes Brother   . Colon cancer Maternal Grandmother   . Irritable bowel syndrome Daughter   . Emphysema Maternal Grandfather        smoked a pipe   Social History   Occupational History  . retired    Social History Main Topics  . Smoking status: Former Smoker    Packs/day: 1.00    Years: 10.00    Types: Cigarettes    Quit date: 12/18/1969  . Smokeless tobacco: Never Used  . Alcohol use Yes     Comment: ocassional use  . Drug use: No  . Sexual activity: Not on file    Tobacco Counseling Counseling given: Not Answered   Activities of Daily Living In your present state of health, do you have any difficulty performing the following activities: 05/29/2017  Hearing? N  Vision? N  Difficulty concentrating or making decisions? N  Walking or climbing stairs? N  Dressing or bathing? N  Doing errands, shopping? N  Preparing Food and eating ? N  Using the Toilet? N  In the past six months, have you accidently leaked urine? N  Do you have problems with loss of bowel control? N  Managing your Medications? N  Managing your Finances? N  Housekeeping or managing your Housekeeping? N  Some recent data might be hidden    Immunizations and Health Maintenance Immunization History  Administered Date(s) Administered  . Influenza Split 10/22/2012, 08/25/2013  . Influenza Whole 08/18/2010  . Influenza, High Dose Seasonal PF  12/16/2015, 12/01/2016  . Influenza,inj,Quad PF,36+ Mos 01/05/2015  . Pneumococcal Conjugate-13 10/23/2013  . Pneumococcal Polysaccharide-23 08/18/2008  . Td 11/10/2014   There are no preventive care reminders to display for this patient.  Patient Care Team: Biagio Borg, MD as PCP - General (Internal Medicine) Christian Mate, MD (Orthopedic Surgery) Bjorn Loser, MD as Consulting Physician (Urology)  Indicate any recent Medical Services you may have received from other than [facility] providers in the past year (date may be approximate).     Assessment:   This is a routine wellness examination for Maria [name]. Physical assessment deferred to PCP.   Hearing/Vision screen Hearing Screening Comments: Able to hear conversational tones w/o difficulty. No issues reported.    Dietary issues  and exercise activities discussed: Current Exercise Habits: Home exercise routine, Type of exercise: walking, Time (Minutes): 40, Frequency (Times/Week): 7, Weekly Exercise (Minutes/Week): 280, Intensity: Mild, Exercise limited by: None identified  Diet (meal preparation, eat out, water intake, caffeinated beverages, dairy products, fruits and vegetables): in general, a "healthy" diet  , well balanced drinks 6-8 glasses of water daily.  Reviewed heart healthy, low carb diets. Discussed portion control and reading food labels. Diet education was attached to patient's AVS.   Goals    . lose 40 pounds          Physically engage like going to the gym 3 days a week and walk 2 days a week, plan and prepare my meals      Depression Screen PHQ 2/9 Scores 05/29/2017 12/29/2016 12/16/2015 11/10/2014 10/23/2013  PHQ - 2 Score 0 0 0 0 0    Fall Risk Fall Risk  05/29/2017 12/23/2016 12/16/2015 11/10/2014 10/23/2013  Falls in the past year? No No Yes No Yes  Number falls in past yr: - - 1 - 1  Injury with Fall? - - No - Yes   Cognitive Function:       Ad8 score reviewed for issues:  Issues making  decisions: no  Less interest in hobbies / activities: no  Repeats questions, stories (family complaining): no  Trouble using ordinary gadgets (microwave, computer, phone):no  Forgets the month or year: no  Mismanaging finances: no  Remembering appts: no  Daily problems with thinking and/or memory: no Ad8 score is= 0  Screening Tests Health Maintenance  Topic Date Due  . INFLUENZA VACCINE  07/18/2017  . COLONOSCOPY  11/20/2018  . TETANUS/TDAP  11/10/2024  . DEXA SCAN  Completed  . PNA vac Low Risk Adult  Completed      Plan:    Continue doing brain stimulating activities (puzzles, reading, adult coloring books, staying active) to keep memory sharp.   Continue to eat heart healthy diet (full of fruits, vegetables, whole grains, lean protein, water--limit salt, fat, and sugar intake) and increase physical activity as tolerated.  I have personally reviewed and noted the following in the patient's chart:   . Medical and social history . Use of alcohol, tobacco or illicit drugs  . Current medications and supplements . Functional ability and status . Nutritional status . Physical activity . Advanced directives . List of other physicians . Vitals . Screenings to include cognitive, depression, and falls . Referrals and appointments  In addition, I have reviewed and discussed with patient certain preventive protocols, quality metrics, and best practice recommendations. A written personalized care plan for preventive services as well as general preventive health recommendations were provided to patient.     Michiel Cowboy, RN   05/29/2017    Medical screening examination/treatment/procedure(s) were performed by non-physician practitioner and as supervising physician I was immediately available for consultation/collaboration. I agree with above. Cathlean Cower, MD

## 2017-05-29 ENCOUNTER — Telehealth: Payer: Self-pay | Admitting: *Deleted

## 2017-05-29 ENCOUNTER — Ambulatory Visit (INDEPENDENT_AMBULATORY_CARE_PROVIDER_SITE_OTHER): Payer: Medicare HMO | Admitting: *Deleted

## 2017-05-29 DIAGNOSIS — Z0000 Encounter for general adult medical examination without abnormal findings: Secondary | ICD-10-CM

## 2017-05-29 MED ORDER — AZELASTINE-FLUTICASONE 137-50 MCG/ACT NA SUSP: 1.0000 | Freq: Two times a day (BID) | NASAL | 5 refills | Status: DC

## 2017-05-29 NOTE — Patient Instructions (Addendum)
Continue doing brain stimulating activities (puzzles, reading, adult coloring books, staying active) to keep memory sharp.    Maria [name] , Thank you for taking time to come for your Medicare Wellness Visit. I appreciate your ongoing commitment to your health goals. Please review the following plan we discussed and let me know if I can assist you in the future.   These are the goals we discussed: Goals    . lose 40 pounds          Physically engage like going to the gym 3 days a week and walk 2 days a week, plan and prepare my meals       This is a list of the screening recommended for you and due dates:  Health Maintenance  Topic Date Due  . Flu Shot  07/18/2017  . Colon Cancer Screening  11/20/2018  . Tetanus Vaccine  11/10/2024  . DEXA scan (bone density measurement)  Completed  . Pneumonia vaccines  Completed     Reading Food Labels Foods that are packaged or in containers have a Nutrition Facts panel on the side or back. This is commonly called the food label. The food label helps you make healthy food choices by providing information about serving size and the amount of calories and various nutrients in the food. You can check the food label to find out if the food contains high or low amounts of nutrients that you want to limit in your diet. You can also use the food label to see if the food is a good source of the nutrients that you want to make sure are included in your diet. How do I read the food label?  Begin by checking the serving size and number of servings in the container. All of the nutrition information listed on the food label is based on one serving. If you eat more than one serving, you must multiply the amounts (such as calories, grams of saturated fat, or milligrams of sodium) by the number of servings.  Check the calories. Choosing foods that are low in calories can help you manage your weight.  Look at the numbers in the % Daily Value column for each listed  nutrient. This gives you an idea of how much of the daily recommended amount for that nutrient is provided in one serving of the food. A daily value of 5% or less is considered low. A daily value of 20% or more is considered high.  Check the amounts for the items you should limit in your diet. These include: ? Total fat. ? Saturated fat. ? Trans fat. ? Cholesterol. ? Sodium.  Check the amounts for the items you should make sure you get enough of. These include: ? Dietary fiber. ? Vitamins A and C. ? Calcium. ? Iron. What information is provided on the food label? Serving information  Serving size. ? The serving size is listed in cups or pieces. The nutrient amounts listed on the food label apply to this amount of the food.  Servings per container or package. ? This shows the number of servings you can expect to get from the container or package if you follow the suggested serving size. Amount per serving  Calories. ? The number of calories in one serving of the food. This information is helpful in managing weight. Low-calorie foods contain 40 calories or less. High-calorie foods contain 400 or more calories.  Calories from fat. ? The number of calories that come from fat in  one serving. Percent daily value Percent daily value (shown on the label as % Daily Value) tells you what percent of the daily value for each nutrient one serving provides. The daily value is the recommended amount of the nutrient that you should get each day. For example, if 15% is listed next to dietary fiber, it means that one serving of the food will give you 15% of the recommended amount of fiber that you should get in a day. The daily values are based on a 2,000-calorie-per-day diet. You may get more or less than 2,000 calories in your diet each day, but the % Daily Value gives you an idea of whether the food contains a high or low amount of the listed nutrient. A daily value of 5% or less is low. A daily value  of 20% or more is high. Total fat Total fat shows you the total amount of fat in one serving (listed in grams). Foods with high amounts of fat usually have higher calories and may lead to weight gain. Two of the fats that make up a portion of the total fat are included on the label:  Saturated fat. ? This number is the amount of saturated fat in one serving (listed in grams). Saturated fat increases the amount of blood cholesterol and should be limited to less than 7% of total calories each day. This means that if you eat 2,000 calories each day, you should eat less than 140 calories from saturated fat.    Cholesterol The amount of cholesterol in one serving is listed in milligrams. Cholesterol should be limited to no more than 300 mg each day. Sodium The amount of sodium in one serving is listed in milligrams. Most people should limit their sodium intake to 2,300 mg a day. Total carbohydrate This number shows the amount of total carbohydrate in one serving (listed in grams). This information can help people with diabetes manage the amount of carbohydrate they eat. Two of the carbohydrates that make up a portion of the total carbohydrate are included on the label:  Dietary fiber. ? The amount of dietary fiber in one serving is listed in grams. Most people should eat at least 25 g of dietary fiber each day.  Sugars. ? The amount of sugar in one serving is also listed in grams. This value includes both naturally occurring sugars from fruit and milk and added sugars such as honey or table sugar.  Protein The amount of protein in one serving is listed in grams. What other important labeling is on the food package? Ingredients Food labels will list each ingredient in the food. The first ingredient listed is the ingredient that the food has the most of. The ingredients are listed in the order of their amount by weight from highest to lowest. Food allergen labeling Food labels may also include a  food allergen warning. Listed here are ingredients that can cause allergic reactions in some people. The potential allergens are listed behind the word "Contains" or "May contain." Examples of ingredients that may be listed are wheat, dairy, eggs, soy, and nuts. If a person knows that he or she is allergic to one of these ingredients, he or she will know to avoid that food. Where to find more information:  U.S. Food and Drug Administration: [place] This information is not intended to replace advice given to you by your health care provider. Make sure you discuss any questions you have with your health care provider. Document Released: 12/04/2005  Document Revised: 08/02/2016 Document Reviewed: 10/27/2013 Elsevier Interactive Patient Education  2017 [place] DASH stands for "Dietary Approaches to Stop Hypertension." The DASH eating plan is a healthy eating plan that has been shown to reduce high blood pressure (hypertension). It may also reduce your risk for type 2 diabetes, heart disease, and stroke. The DASH eating plan may also help with weight loss. What are tips for following this plan? General guidelines  Avoid eating more than 2,300 mg (milligrams) of salt (sodium) a day. If you have hypertension, you may need to reduce your sodium intake to 1,500 mg a day.  Limit alcohol intake to no more than 1 drink a day for nonpregnant women and 2 drinks a day for men. One drink equals 12 oz of beer, 5 oz of Demere Dotzler, or 1 oz of hard liquor.  Work with your health care provider to maintain a healthy body weight or to lose weight. Ask what an ideal weight is for you.  Get at least 30 minutes of exercise that causes your heart to beat faster (aerobic exercise) most days of the week. Activities may include walking, swimming, or biking.  Work with your health care provider or diet and nutrition specialist (dietitian) to adjust your eating plan to your individual calorie  needs. Reading food labels  Check food labels for the amount of sodium per serving. Choose foods with less than 5 percent of the Daily Value of sodium. Generally, foods with less than 300 mg of sodium per serving fit into this eating plan.  To find whole grains, look for the word "whole" as the first word in the ingredient list. Shopping  Buy products labeled as "low-sodium" or "no salt added."  Buy fresh foods. Avoid canned foods and premade or frozen meals. Cooking  Avoid adding salt when cooking. Use salt-free seasonings or herbs instead of table salt or sea salt. Check with your health care provider or pharmacist before using salt substitutes.  Do not fry foods. Cook foods using healthy methods such as baking, boiling, grilling, and broiling instead.  Cook with heart-healthy oils, such as olive, canola, soybean, or sunflower oil. Meal planning   Eat a balanced diet that includes: ? 5 or more servings of fruits and vegetables each day. At each meal, try to fill half of your plate with fruits and vegetables. ? Up to 6-8 servings of whole grains each day. ? Less than 6 oz of lean meat, poultry, or fish each day. A 3-oz serving of meat is about the same size as a deck of cards. One egg equals 1 oz. ? 2 servings of low-fat dairy each day. ? A serving of nuts, seeds, or beans 5 times each week. ? Heart-healthy fats. Healthy fats called Omega-3 fatty acids are found in foods such as flaxseeds and coldwater fish, like sardines, salmon, and mackerel.  Limit how much you eat of the following: ? Canned or prepackaged foods. ? Food that is high in trans fat, such as fried foods. ? Food that is high in saturated fat, such as fatty meat. ? Sweets, desserts, sugary drinks, and other foods with added sugar. ? Full-fat dairy products.  Do not salt foods before eating.  Try to eat at least 2 vegetarian meals each week.  Eat more home-cooked food and less restaurant, buffet, and fast  food.  When eating at a restaurant, ask that your food be prepared with less salt or no salt, if possible. What foods are recommended?  The items listed may not be a complete list. Talk with your dietitian about what dietary choices are best for you. Grains Whole-grain or whole-wheat bread. Whole-grain or whole-wheat pasta. Brown rice. [place]ena Morrow. Bulgur. Whole-grain and low-sodium cereals. Pita bread. Low-fat, low-sodium crackers. Whole-wheat flour tortillas. Vegetables Fresh or frozen vegetables (raw, steamed, roasted, or grilled). Low-sodium or reduced-sodium tomato and vegetable juice. Low-sodium or reduced-sodium tomato sauce and tomato paste. Low-sodium or reduced-sodium canned vegetables. Fruits All fresh, dried, or frozen fruit. Canned fruit in natural juice (without added sugar). Meat and other protein foods Skinless chicken or [place]. Ground chicken or [place]. Pork with fat trimmed off. Fish and seafood. Egg whites. Dried beans, peas, or lentils. Unsalted nuts, nut butters, and seeds. Unsalted canned beans. Lean cuts of beef with fat trimmed off. Low-sodium, lean deli meat. Dairy Low-fat (1%) or fat-free (skim) milk. Fat-free, low-fat, or reduced-fat cheeses. Nonfat, low-sodium ricotta or cottage cheese. Low-fat or nonfat yogurt. Low-fat, low-sodium cheese. Fats and oils Soft margarine without trans fats. Vegetable oil. Low-fat, reduced-fat, or light mayonnaise and salad dressings (reduced-sodium). Canola, safflower, olive, soybean, and sunflower oils. Avocado. Seasoning and other foods Herbs. Spices. Seasoning mixes without salt. Unsalted popcorn and pretzels. Fat-free sweets. What foods are not recommended? The items listed may not be a complete list. Talk with your dietitian about what dietary choices are best for you. Grains Baked goods made with fat, such as croissants, muffins, or some breads. Dry pasta or rice meal packs. Vegetables Creamed or fried vegetables. Vegetables  in a cheese sauce. Regular canned vegetables (not low-sodium or reduced-sodium). Regular canned tomato sauce and paste (not low-sodium or reduced-sodium). Regular tomato and vegetable juice (not low-sodium or reduced-sodium). Angie Fava. Olives. Fruits Canned fruit in a light or heavy syrup. Fried fruit. Fruit in cream or butter sauce. Meat and other protein foods Fatty cuts of meat. Ribs. Fried meat. Berniece Salines. Sausage. Bologna and other processed lunch meats. Salami. Fatback. Hotdogs. Bratwurst. Salted nuts and seeds. Canned beans with added salt. Canned or smoked fish. Whole eggs or egg yolks. Chicken or [place] with skin. Dairy Whole or 2% milk, cream, and half-and-half. Whole or full-fat cream cheese. Whole-fat or sweetened yogurt. Full-fat cheese. Nondairy creamers. Whipped toppings. Processed cheese and cheese spreads. Fats and oils Butter. Stick margarine. Lard. Shortening. Ghee. Bacon fat. Tropical oils, such as coconut, palm kernel, or palm oil. Seasoning and other foods Salted popcorn and pretzels. Onion salt, garlic salt, seasoned salt, table salt, and sea salt. Worcestershire sauce. Tartar sauce. Barbecue sauce. Teriyaki sauce. Soy sauce, including reduced-sodium. Steak sauce. Canned and packaged gravies. Fish sauce. Oyster sauce. Cocktail sauce. Horseradish that you find on the shelf. Ketchup. Mustard. Meat flavorings and tenderizers. Bouillon cubes. Hot sauce and Tabasco sauce. Premade or packaged marinades. Premade or packaged taco seasonings. Relishes. Regular salad dressings. Where to find more information:  National Heart, Lung, and [place]: https://wilson-eaton.com/  American Heart Association: www.heart.org Summary  The DASH eating plan is a healthy eating plan that has been shown to reduce high blood pressure (hypertension). It may also reduce your risk for type 2 diabetes, heart disease, and stroke.  With the DASH eating plan, you should limit salt (sodium) intake to 2,300 mg a  day. If you have hypertension, you may need to reduce your sodium intake to 1,500 mg a day.  When on the DASH eating plan, aim to eat more fresh fruits and vegetables, whole grains, lean proteins, low-fat dairy, and heart-healthy fats.  Work with your health care  provider or diet and nutrition specialist (dietitian) to adjust your eating plan to your individual calorie needs. This information is not intended to replace advice given to you by your health care provider. Make sure you discuss any questions you have with your health care provider. Document Released: 11/23/2011 Document Revised: 11/27/2016 Document Reviewed: 11/27/2016 Elsevier Interactive Patient Education  2017 Elsevier Inc. Diabetes Mellitus and Food It is important for you to manage your blood sugar (glucose) level. Your blood glucose level can be greatly affected by what you eat. Eating healthier foods in the appropriate amounts throughout the day at about the same time each day will help you control your blood glucose level. It can also help slow or prevent worsening of your diabetes mellitus. Healthy eating may even help you improve the level of your blood pressure and reach or maintain a healthy weight. General recommendations for healthful eating and cooking habits include:  Eating meals and snacks regularly. Avoid going long periods of time without eating to lose weight.  Eating a diet that consists mainly of plant-based foods, such as fruits, vegetables, nuts, legumes, and whole grains.  Using low-heat cooking methods, such as baking, instead of high-heat cooking methods, such as deep frying.  Work with your dietitian to make sure you understand how to use the Nutrition Facts information on food labels. How can food affect me? Carbohydrates Carbohydrates affect your blood glucose level more than any other type of food. Your dietitian will help you determine how many carbohydrates to eat at each meal and teach you how to  count carbohydrates. Counting carbohydrates is important to keep your blood glucose at a healthy level, especially if you are using insulin or taking certain medicines for diabetes mellitus. Alcohol Alcohol can cause sudden decreases in blood glucose (hypoglycemia), especially if you use insulin or take certain medicines for diabetes mellitus. Hypoglycemia can be a life-threatening condition. Symptoms of hypoglycemia (sleepiness, dizziness, and disorientation) are similar to symptoms of having too much alcohol. If your health care provider has given you approval to drink alcohol, do so in moderation and use the following guidelines:  Women should not have more than one drink per day, and men should not have more than two drinks per day. One drink is equal to: ? 12 oz of beer. ? 5 oz of Alysen Smylie. ? 1 oz of hard liquor.  Do not drink on an empty stomach.  Keep yourself hydrated. Have water, diet soda, or unsweetened iced tea.  Regular soda, juice, and other mixers might contain a lot of carbohydrates and should be counted.  What foods are not recommended? As you make food choices, it is important to remember that all foods are not the same. Some foods have fewer nutrients per serving than other foods, even though they might have the same number of calories or carbohydrates. It is difficult to get your body what it needs when you eat foods with fewer nutrients. Examples of foods that you should avoid that are high in calories and carbohydrates but low in nutrients include:  Trans fats (most processed foods list trans fats on the Nutrition Facts label).  Regular soda.  Juice.  Candy.  Sweets, such as cake, pie, doughnuts, and cookies.  Fried foods.  What foods can I eat? Eat nutrient-rich foods, which will nourish your body and keep you healthy. The food you should eat also will depend on several factors, including:  The calories you need.  The medicines you take.  Your weight.  Your  blood glucose level.  Your blood pressure level.  Your cholesterol level.  You should eat a variety of foods, including:  Protein. ? Lean cuts of meat. ? Proteins low in saturated fats, such as fish, egg whites, and beans. Avoid processed meats.  Fruits and vegetables. ? Fruits and vegetables that may help control blood glucose levels, such as apples, mangoes, and yams.  Dairy products. ? Choose fat-free or low-fat dairy products, such as milk, yogurt, and cheese.  Grains, bread, pasta, and rice. ? Choose whole grain products, such as multigrain bread, whole oats, and brown rice. These foods may help control blood pressure.  Fats. ? Foods containing healthful fats, such as nuts, avocado, olive oil, canola oil, and fish.  Does everyone with diabetes mellitus have the same meal plan? Because every person with diabetes mellitus is different, there is not one meal plan that works for everyone. It is very important that you meet with a dietitian who will help you create a meal plan that is just right for you. This information is not intended to replace advice given to you by your health care provider. Make sure you discuss any questions you have with your health care provider. Document Released: 08/31/2005 Document Revised: 05/11/2016 Document Reviewed: 10/31/2013 Elsevier Interactive Patient Education  2017 [employer].

## 2017-05-29 NOTE — Telephone Encounter (Signed)
lmtcb x2 to reschedule appt.

## 2017-05-29 NOTE — Telephone Encounter (Signed)
During AWV, patient stated that she needs a refill for Dymista.

## 2017-05-29 NOTE — Telephone Encounter (Signed)
Ok, this is done

## 2017-06-01 ENCOUNTER — Encounter (HOSPITAL_BASED_OUTPATIENT_CLINIC_OR_DEPARTMENT_OTHER): Payer: Medicare HMO

## 2017-06-08 ENCOUNTER — Encounter: Payer: Self-pay | Admitting: *Deleted

## 2017-06-08 NOTE — Telephone Encounter (Signed)
We've attempted to contact patient multiple times with no success Per office protocol, after the 3rd call message to be signed off Will sign off

## 2017-06-08 NOTE — Telephone Encounter (Signed)
lmtcb x3 for pt. 

## 2017-06-15 DIAGNOSIS — N3946 Mixed incontinence: Secondary | ICD-10-CM | POA: Diagnosis not present

## 2017-06-15 DIAGNOSIS — R350 Frequency of micturition: Secondary | ICD-10-CM | POA: Diagnosis not present

## 2017-06-15 DIAGNOSIS — N3944 Nocturnal enuresis: Secondary | ICD-10-CM | POA: Diagnosis not present

## 2017-06-15 DIAGNOSIS — R351 Nocturia: Secondary | ICD-10-CM | POA: Diagnosis not present

## 2017-06-18 ENCOUNTER — Telehealth: Payer: Self-pay

## 2017-06-18 MED ORDER — NITROFURANTOIN MONOHYD MACRO 100 MG PO CAPS: 100.0000 mg | ORAL_CAPSULE | Freq: Two times a day (BID) | ORAL | 0 refills | Status: DC

## 2017-06-18 NOTE — Telephone Encounter (Signed)
Pt called [facility] and  Stated that she is having dysuria symptoms. POC UA was done on Friday and no indications of a UTI were seen. [place] into [place] office.   Please advise in PCP absence.   Pt can be reached at [phone]

## 2017-06-18 NOTE — Telephone Encounter (Signed)
I do not see urine results from last Friday - are they in her chart?

## 2017-06-18 NOTE — Telephone Encounter (Signed)
I do not see urine - we can call in antibitoic - pending -- please send ot pof

## 2017-06-18 NOTE — Telephone Encounter (Signed)
I did not, pt stated on the phone that she had one done.

## 2017-06-19 DIAGNOSIS — Z7982 Long term (current) use of aspirin: Secondary | ICD-10-CM | POA: Diagnosis not present

## 2017-06-19 DIAGNOSIS — Z87891 Personal history of nicotine dependence: Secondary | ICD-10-CM | POA: Diagnosis not present

## 2017-06-19 DIAGNOSIS — Z880 Allergy status to penicillin: Secondary | ICD-10-CM | POA: Diagnosis not present

## 2017-06-19 DIAGNOSIS — N390 Urinary tract infection, site not specified: Secondary | ICD-10-CM | POA: Diagnosis not present

## 2017-06-19 DIAGNOSIS — I10 Essential (primary) hypertension: Secondary | ICD-10-CM | POA: Diagnosis not present

## 2017-06-19 DIAGNOSIS — E119 Type 2 diabetes mellitus without complications: Secondary | ICD-10-CM | POA: Diagnosis not present

## 2017-06-19 DIAGNOSIS — K219 Gastro-esophageal reflux disease without esophagitis: Secondary | ICD-10-CM | POA: Diagnosis not present

## 2017-06-22 DIAGNOSIS — I10 Essential (primary) hypertension: Secondary | ICD-10-CM | POA: Diagnosis not present

## 2017-06-22 DIAGNOSIS — S53402A Unspecified sprain of left elbow, initial encounter: Secondary | ICD-10-CM | POA: Diagnosis not present

## 2017-06-22 DIAGNOSIS — Z880 Allergy status to penicillin: Secondary | ICD-10-CM | POA: Diagnosis not present

## 2017-06-22 DIAGNOSIS — K219 Gastro-esophageal reflux disease without esophagitis: Secondary | ICD-10-CM | POA: Diagnosis not present

## 2017-06-22 DIAGNOSIS — Z87891 Personal history of nicotine dependence: Secondary | ICD-10-CM | POA: Diagnosis not present

## 2017-06-22 DIAGNOSIS — S52122A Displaced fracture of head of left radius, initial encounter for closed fracture: Secondary | ICD-10-CM | POA: Diagnosis not present

## 2017-06-22 DIAGNOSIS — M25532 Pain in left wrist: Secondary | ICD-10-CM | POA: Diagnosis not present

## 2017-06-22 DIAGNOSIS — Z7982 Long term (current) use of aspirin: Secondary | ICD-10-CM | POA: Diagnosis not present

## 2017-06-22 DIAGNOSIS — S52045A Nondisplaced fracture of coronoid process of left ulna, initial encounter for closed fracture: Secondary | ICD-10-CM | POA: Diagnosis not present

## 2017-06-25 DIAGNOSIS — S52122A Displaced fracture of head of left radius, initial encounter for closed fracture: Secondary | ICD-10-CM | POA: Diagnosis not present

## 2017-06-25 DIAGNOSIS — W19XXXA Unspecified fall, initial encounter: Secondary | ICD-10-CM | POA: Diagnosis not present

## 2017-07-09 DIAGNOSIS — M25622 Stiffness of left elbow, not elsewhere classified: Secondary | ICD-10-CM | POA: Diagnosis not present

## 2017-07-09 DIAGNOSIS — M25522 Pain in left elbow: Secondary | ICD-10-CM | POA: Diagnosis not present

## 2017-07-16 DIAGNOSIS — M25522 Pain in left elbow: Secondary | ICD-10-CM | POA: Diagnosis not present

## 2017-07-16 DIAGNOSIS — M25622 Stiffness of left elbow, not elsewhere classified: Secondary | ICD-10-CM | POA: Diagnosis not present

## 2017-07-20 DIAGNOSIS — M25522 Pain in left elbow: Secondary | ICD-10-CM | POA: Diagnosis not present

## 2017-07-20 DIAGNOSIS — M25622 Stiffness of left elbow, not elsewhere classified: Secondary | ICD-10-CM | POA: Diagnosis not present

## 2017-07-24 DIAGNOSIS — M25622 Stiffness of left elbow, not elsewhere classified: Secondary | ICD-10-CM | POA: Diagnosis not present

## 2017-07-24 DIAGNOSIS — M25522 Pain in left elbow: Secondary | ICD-10-CM | POA: Diagnosis not present

## 2017-07-31 ENCOUNTER — Ambulatory Visit (HOSPITAL_BASED_OUTPATIENT_CLINIC_OR_DEPARTMENT_OTHER): Payer: Medicare HMO | Attending: Pulmonary Disease | Admitting: Pulmonary Disease

## 2017-07-31 VITALS — Ht 63.0 in | Wt 170.0 lb

## 2017-07-31 DIAGNOSIS — G4761 Periodic limb movement disorder: Secondary | ICD-10-CM | POA: Diagnosis not present

## 2017-07-31 DIAGNOSIS — J849 Interstitial pulmonary disease, unspecified: Secondary | ICD-10-CM | POA: Diagnosis not present

## 2017-07-31 DIAGNOSIS — G4733 Obstructive sleep apnea (adult) (pediatric): Secondary | ICD-10-CM | POA: Insufficient documentation

## 2017-07-31 DIAGNOSIS — M25622 Stiffness of left elbow, not elsewhere classified: Secondary | ICD-10-CM | POA: Diagnosis not present

## 2017-07-31 DIAGNOSIS — M25522 Pain in left elbow: Secondary | ICD-10-CM | POA: Diagnosis not present

## 2017-08-02 DIAGNOSIS — N3946 Mixed incontinence: Secondary | ICD-10-CM | POA: Diagnosis not present

## 2017-08-02 DIAGNOSIS — N390 Urinary tract infection, site not specified: Secondary | ICD-10-CM | POA: Diagnosis not present

## 2017-08-06 DIAGNOSIS — S52122D Displaced fracture of head of left radius, subsequent encounter for closed fracture with routine healing: Secondary | ICD-10-CM | POA: Diagnosis not present

## 2017-08-07 DIAGNOSIS — M25622 Stiffness of left elbow, not elsewhere classified: Secondary | ICD-10-CM | POA: Diagnosis not present

## 2017-08-07 DIAGNOSIS — M25522 Pain in left elbow: Secondary | ICD-10-CM | POA: Diagnosis not present

## 2017-08-15 ENCOUNTER — Telehealth: Payer: Self-pay | Admitting: Pulmonary Disease

## 2017-08-15 DIAGNOSIS — G4733 Obstructive sleep apnea (adult) (pediatric): Secondary | ICD-10-CM

## 2017-08-15 DIAGNOSIS — J849 Interstitial pulmonary disease, unspecified: Secondary | ICD-10-CM

## 2017-08-15 DIAGNOSIS — G4761 Periodic limb movement disorder: Secondary | ICD-10-CM | POA: Insufficient documentation

## 2017-08-15 NOTE — Telephone Encounter (Signed)
PSG 07/31/17 >> AHI 6.4, SaO2 low 85%, PLMI 47.99.   Will have my nurse inform pt that sleep study shows mild sleep apnea.  Options are 1) CPAP now, 2) ROV first.  If She is agreeable to CPAP, then please arrange for in lab CPAP titration study.

## 2017-08-15 NOTE — Procedures (Signed)
   Patient Name: [name], Maria [name] Date: 07/31/2017 Gender: Female D.O.B: [birth year] Age (years): 75 Referring Provider: Elza Rafter De Dios Height (inches): 67 Interpreting Physician: Chesley Mires MD, ABSM Weight (lbs): 170 RPSGT: Laren Everts BMI: 30 MRN: [id number] Neck Size: 14.00  CLINICAL INFORMATION Sleep Study Type: NPSG  76 yo female with prior history of sleep apnea and had been on CPAP, but not recently. She worsening sleep disruption and daytime sleepiness. She is referred for a diagnostic study. She has a history of interstitial lung disease.  Epworth Sleepiness Score: 4  SLEEP STUDY TECHNIQUE As per the AASM Manual for the Scoring of Sleep and Associated Events v2.3 (April 2016) with a hypopnea requiring 4% desaturations.  The channels recorded and monitored were frontal, central and occipital EEG, electrooculogram (EOG), submentalis EMG (chin), nasal and oral airflow, thoracic and abdominal wall motion, anterior tibialis EMG, snore microphone, electrocardiogram, and pulse oximetry.  MEDICATIONS Medications self-administered by patient taken the night of the study : N/A  SLEEP ARCHITECTURE The study was initiated at 10:03:20 PM and ended at 4:45:38 AM.  Sleep onset time was 6.5 minutes and the sleep efficiency was 86.1%. The total sleep time was 346.3 minutes.  Stage REM latency was 136.5 minutes.  The patient spent 8.81% of the night in stage N1 sleep, 68.53% in stage N2 sleep, 0.29% in stage N3 and 22.38% in REM.  Alpha intrusion was absent.  Supine sleep was 24.73%.  RESPIRATORY PARAMETERS The overall apnea/hypopnea index (AHI) was 6.4 per hour. There were 2 total apneas, including 1 obstructive, 1 central and 0 mixed apneas. There were 35 hypopneas and 12 RERAs.  The AHI during Stage REM sleep was 27.1 per hour.  AHI while supine was 11.2 per hour.  The mean oxygen saturation was 92.48%. The minimum SpO2 during sleep was 85.00%.  Moderate  snoring was noted during this study.  CARDIAC DATA The 2 lead EKG demonstrated sinus rhythm. The mean heart rate was 61.97 beats per minute. Other EKG findings include: None.  LEG MOVEMENT DATA The total PLMS were 277 with a resulting PLMS index of 47.99. Associated arousal with leg movement index was 23.0 .  IMPRESSIONS - Mild obstructive sleep apnea occurred during this study (AHI = 6.4/h). - Mild oxygen desaturation was noted during this study (Min O2 = 85.00%). - Moderate periodic limb movements of sleep occurred during the study. Associated arousals were significant.  DIAGNOSIS - Obstructive Sleep Apnea (G47.33) - Interstitial Lung Disease (J84.10) - Periodic Limb Movements of Sleep (G47.61)  RECOMMENDATIONS - She should be referred back to the sleep lab for a CPAP titration study.  [Electronically signed] 08/15/2017 01:38 PM  Chesley Mires MD, [place], American Board of Sleep Medicine   NPI: [id number]

## 2017-08-17 DIAGNOSIS — M25622 Stiffness of left elbow, not elsewhere classified: Secondary | ICD-10-CM | POA: Diagnosis not present

## 2017-08-17 DIAGNOSIS — M25522 Pain in left elbow: Secondary | ICD-10-CM | POA: Diagnosis not present

## 2017-08-17 NOTE — Telephone Encounter (Signed)
LM for patient x 1

## 2017-08-21 DIAGNOSIS — M25622 Stiffness of left elbow, not elsewhere classified: Secondary | ICD-10-CM | POA: Diagnosis not present

## 2017-08-21 DIAGNOSIS — M25522 Pain in left elbow: Secondary | ICD-10-CM | POA: Diagnosis not present

## 2017-08-30 NOTE — Telephone Encounter (Signed)
Called spoke with patient who would prefer to be seen for ROV before beginning CPAP Pt unable to come for appt before 10.8.18 Appt scheduled for 10.8.18 @ 1630 per pt's request  Nothing further needed; will sign off

## 2017-09-12 DIAGNOSIS — N3946 Mixed incontinence: Secondary | ICD-10-CM | POA: Diagnosis not present

## 2017-09-24 ENCOUNTER — Encounter: Payer: Self-pay | Admitting: Adult Health

## 2017-09-24 ENCOUNTER — Ambulatory Visit (INDEPENDENT_AMBULATORY_CARE_PROVIDER_SITE_OTHER): Payer: Medicare HMO | Admitting: Adult Health

## 2017-09-24 VITALS — BP 122/74 | HR 74 | Ht 63.75 in | Wt 175.2 lb

## 2017-09-24 DIAGNOSIS — G4733 Obstructive sleep apnea (adult) (pediatric): Secondary | ICD-10-CM

## 2017-09-24 NOTE — Patient Instructions (Signed)
Set up for CPAP titration study . If this is not affordable , let us know.  Follow up with Dr. Halford Chessman  Or Parrett NP in 2 months and As needed

## 2017-09-24 NOTE — Progress Notes (Signed)
_0  ID: Maria [name], female    DOB: 1941-07-09, 76 y.o.   MRN: [id number]  No chief complaint on file.   Referring provider: Biagio Borg, MD  HPI: 76 yo female seen for sleep consult 02/2017 for OSA . She was dx with OSA in 2008 with mild to moderate OSA . She wore it from 2008 to 2015 but machine broke and she did not start back on it. Set up for sleep study done on 07/2017 that showed mild OSA with AHI 6/hr. , mild o2 desat 85%. MPLM .   09/24/2017 Follow up: OSA Pt returns for follow up for sleep apnea. She was seen in March for sleep consult. She had known OSA but had been off CPAP for few years. Was having daytime sleepiness and snoring . Sleep study was done in August that showed mild OSA with AHI at 6/hr w/ mild desats at 85%.  We discussed her study results and recommendations for CPAP titration study. She would like to proceed with this.    Allergies  Allergen Reactions  . Amoxicillin Hives  . Ace Inhibitors Other (See Comments)    unknown  . Bee Venom   . Benicar [Olmesartan Medoxomil] Other (See Comments)    Tongue swelling  . Codeine     REACTION: nausea  . Lisinopril Other (See Comments)    unknown  . Macrobid [Nitrofurantoin Monohyd Macro]     Pulmonary Fibrosis  . Nutritional Supplements     Bee stings cause angioedema-carry an EPIPEN    Immunization History  Administered Date(s) Administered  . Influenza Split 10/22/2012, 08/25/2013  . Influenza Whole 08/18/2010  . Influenza, High Dose Seasonal PF 12/16/2015, 12/01/2016  . Influenza,inj,Quad PF,6+ Mos 01/05/2015  . Pneumococcal Conjugate-13 10/23/2013  . Pneumococcal Polysaccharide-23 08/18/2008  . Td 11/10/2014    Past Medical History:  Diagnosis Date  . Anemia   . Arthritis   . Carpal tunnel syndrome   . Diverticulosis   . GERD (gastroesophageal reflux disease)   . Headache(784.0)    chronic  . High cholesterol   . Hyperlipidemia   . Hypertension   . Migraine   . Nephritis   .  OSA (obstructive sleep apnea)   . Palpitations   . Personal history of colonic adenomas 09/15/2010  . TIA (transient ischemic attack)   . Vertigo, benign paroxysmal     Tobacco History: History  Smoking Status  . Former Smoker  . Packs/day: 1.00  . Years: 10.00  . Types: Cigarettes  . Quit date: 12/18/1969  Smokeless Tobacco  . Never Used   Counseling given: Not Answered   Outpatient Encounter Prescriptions as of 09/24/2017  Medication Sig  . albuterol (PROAIR HFA) 108 (90 Base) MCG/ACT inhaler INHALE 2 PUFFS INTO THE LUNCH EVERY 6 HOURS AS NEEDED FOR WHEEZING OR SHORTNESS OF BREATH  . amLODipine (NORVASC) 5 MG tablet Take 1 tablet (5 mg total) by mouth daily.  Marland Kitchen aspirin 81 MG tablet Take 81 mg by mouth daily.  Marland Kitchen b complex vitamins tablet Take 1 tablet by mouth daily.    . budesonide-formoterol (SYMBICORT) 160-4.5 MCG/ACT inhaler Inhale 2 puffs into the lungs 2 (two) times daily.  Marland Kitchen CALCIUM PO Take by mouth daily.    . Cholecalciferol (VITAMIN D-3 PO) Take by mouth daily.    . clopidogrel (PLAVIX) 75 MG tablet Take 1 tablet (75 mg total) by mouth daily.  . hydrochlorothiazide (MICROZIDE) 12.5 MG capsule Take 1 capsule (12.5 mg total) by mouth daily.  Marland Kitchen  levocetirizine (XYZAL) 5 MG tablet Take 1 tablet (5 mg total) by mouth every evening.  . magnesium oxide (MAG-OX) 400 MG tablet Take 400 mg by mouth daily.  . montelukast (SINGULAIR) 10 MG tablet Take 1 tablet (10 mg total) by mouth at bedtime.  . nitrofurantoin, macrocrystal-monohydrate, (MACROBID) 100 MG capsule Take 1 capsule (100 mg total) by mouth 2 (two) times daily.  . potassium chloride (KLOR-CON 10) 10 MEQ tablet Take 1 tablet (10 mEq total) by mouth daily.  . simvastatin (ZOCOR) 10 MG tablet Take 1 tablet (10 mg total) by mouth daily at 6 PM.  . Azelastine-Fluticasone (DYMISTA) 137-50 MCG/ACT SUSP Place 1 puff into the nose 2 (two) times daily. (Patient not taking: Reported on 09/24/2017)   No facility-administered  encounter medications on file as of 09/24/2017.      Review of Systems  Constitutional:   No  weight loss, night sweats,  Fevers, chills, fatigue, or  lassitude.  HEENT:   No headaches,  Difficulty swallowing,  Tooth/dental problems, or  Sore throat,                No sneezing, itching, ear ache, nasal congestion, post nasal drip,   CV:  No chest pain,  Orthopnea, PND, swelling in lower extremities, anasarca, dizziness, palpitations, syncope.   GI  No heartburn, indigestion, abdominal pain, nausea, vomiting, diarrhea, change in bowel habits, loss of appetite, bloody stools.   Resp: No shortness of breath with exertion or at rest.  No excess mucus, no productive cough,  No non-productive cough,  No coughing up of blood.  No change in color of mucus.  No wheezing.  No chest wall deformity  Skin: no rash or lesions.  GU: no dysuria, change in color of urine, no urgency or frequency.  No flank pain, no hematuria   MS:  No joint pain or swelling.  No decreased range of motion.  No back pain.    Physical Exam  BP 122/74 (BP Location: Left Arm, Cuff Size: Normal)   Pulse 74   Ht 5' 3.75" (1.619 m)   Wt 175 lb 3.2 oz (79.5 kg)   SpO2 95%   BMI 30.31 kg/m   GEN: A/Ox3; pleasant , NAD, well nourished    HEENT:  [place]/AT,  EACs-clear, TMs-wnl, NOSE-clear, THROAT-clear, no lesions, no postnasal drip or exudate noted. Class 2 MP airway   NECK:  Supple w/ fair ROM; no JVD; normal carotid impulses w/o bruits; no thyromegaly or nodules palpated; no lymphadenopathy.    RESP  Clear  P & A; w/o, wheezes/ rales/ or rhonchi. no accessory muscle use, no dullness to percussion  CARD:  RRR, no m/r/g, no peripheral edema, pulses intact, no cyanosis or clubbing.  GI:   Soft & nt; nml bowel sounds; no organomegaly or masses detected.   Musco: Warm bil, no deformities or joint swelling noted.   Neuro: alert, no focal deficits noted.    Skin: Warm, no lesions or rashes    Lab  Results:   BNP No results found for: BNP  ProBNP No results found for: PROBNP  Imaging: No results found.   Assessment & Plan:   No problem-specific Assessment & Plan notes found for this encounter.     Rexene Edison, NP 09/24/2017

## 2017-09-25 DIAGNOSIS — H25011 Cortical age-related cataract, right eye: Secondary | ICD-10-CM | POA: Diagnosis not present

## 2017-09-25 DIAGNOSIS — H2511 Age-related nuclear cataract, right eye: Secondary | ICD-10-CM | POA: Diagnosis not present

## 2017-09-25 DIAGNOSIS — H26492 Other secondary cataract, left eye: Secondary | ICD-10-CM | POA: Diagnosis not present

## 2017-09-25 DIAGNOSIS — Z961 Presence of intraocular lens: Secondary | ICD-10-CM | POA: Diagnosis not present

## 2017-09-27 NOTE — Assessment & Plan Note (Signed)
Mild OSA on sleep study  With desaturations  Set up for CPAP titration study .   Plan  Patient Instructions  Set up for CPAP titration study . If this is not affordable , let us know.  Follow up with Dr. Halford Chessman  Or Leesa Leifheit NP in 2 months and As needed

## 2017-09-28 DIAGNOSIS — R350 Frequency of micturition: Secondary | ICD-10-CM | POA: Diagnosis not present

## 2017-09-28 DIAGNOSIS — N3946 Mixed incontinence: Secondary | ICD-10-CM | POA: Diagnosis not present

## 2017-10-30 ENCOUNTER — Ambulatory Visit (INDEPENDENT_AMBULATORY_CARE_PROVIDER_SITE_OTHER): Payer: Medicare HMO

## 2017-10-30 DIAGNOSIS — Z23 Encounter for immunization: Secondary | ICD-10-CM | POA: Diagnosis not present

## 2017-11-06 ENCOUNTER — Telehealth: Payer: Self-pay | Admitting: Internal Medicine

## 2017-11-06 NOTE — Telephone Encounter (Signed)
Patient Name: Maria [name] DOB: April 10, 1941 Initial Comment Caller is having pain in right collarbone and up into her neck for a few days . Its very sensitive to the touch . Pain is not bad this morning . She thinks it may be a pulled muscle Nurse Assessment Nurse: Lavera Guise, RN, Vaughan Basta Date/Time (Eastern Time): 11/06/2017 9:09:30 AM Confirm and document reason for call. If symptomatic, describe symptoms. ---Caller states she is having pain in right collarbone and up into her neck for 3 or 4 days . Its very sensitive to the touch . Pain is not bad this morning . She thinks it may be a pulled muscle. Could not lift or reach up this weekend, but now much better. Wondering if she injured it at the gym. Seems better today. Was sensitive to the touch, but now is just a little tender and has a little pain with stretching. Not swollen like it was. Pain is minimal now, 2/10, compared to how bad it was Sunday. Does the patient have any new or worsening symptoms? ---Yes Will a triage be completed? ---Yes Related visit to physician within the last 2 weeks? ---N/A Does the PT have any chronic conditions? (i.e. diabetes, asthma, etc.) ---Yes List chronic conditions. ---hypertension Is this a behavioral health or substance abuse call? ---No Guidelines Guideline Title Affirmed Question Affirmed Notes Neck Pain or Stiffness [1] Age > 50 AND [2] no history of prior similar neck pain Final Disposition User See PCP within [place], RN, [employer] Referrals REFERRED TO PCP OFFICE Caller Disagree/Comply Comply Caller Understands Yes PreDisposition Did not know what to do (Comments: Record entered for [employer].)

## 2017-11-07 ENCOUNTER — Telehealth: Payer: Self-pay | Admitting: Internal Medicine

## 2017-11-07 NOTE — Telephone Encounter (Signed)
11/06/2017 @ 9:13am [place] Message: Pt called stating that she has been having pain around her right collarbone and up into her neck for the past 3 or 4 days. She thinks it could be a pulled muscle because she has not been able to lift or reach but is getting better. She was told to follow up with her PCP.  I called the pt to see how she was doing. She said that she is much better and thinks it was a pulled muscle from the gym. Not need for visit.

## 2017-11-14 DIAGNOSIS — R351 Nocturia: Secondary | ICD-10-CM | POA: Diagnosis not present

## 2017-11-14 DIAGNOSIS — N3946 Mixed incontinence: Secondary | ICD-10-CM | POA: Diagnosis not present

## 2017-11-15 ENCOUNTER — Ambulatory Visit (HOSPITAL_BASED_OUTPATIENT_CLINIC_OR_DEPARTMENT_OTHER): Payer: Medicare HMO | Attending: Adult Health | Admitting: Pulmonary Disease

## 2017-11-15 VITALS — Ht 63.0 in | Wt 170.0 lb

## 2017-11-15 DIAGNOSIS — G4733 Obstructive sleep apnea (adult) (pediatric): Secondary | ICD-10-CM | POA: Diagnosis not present

## 2017-11-15 DIAGNOSIS — G4730 Sleep apnea, unspecified: Secondary | ICD-10-CM | POA: Diagnosis present

## 2017-11-17 DIAGNOSIS — G4733 Obstructive sleep apnea (adult) (pediatric): Secondary | ICD-10-CM

## 2017-11-17 NOTE — Procedures (Signed)
   Patient Name: Maria [name], [place] Date: 11/15/2017 Gender: Female D.O.B: September 06, 1941 Age (years): 71 Referring Provider: Tammy Parrett Height (inches): 63 Interpreting Physician: Chesley Mires MD, ABSM Weight (lbs): 170 RPSGT: Laren Everts BMI: 30 MRN: [id number] Neck Size: 14.00  CLINICAL INFORMATION The patient is referred for a CPAP titration to treat sleep apnea.  Date of NPSG, Split Night or HST: 07/31/17, AHI 6.4, SpO2 low 85%.  SLEEP STUDY TECHNIQUE As per the AASM Manual for the Scoring of Sleep and Associated Events v2.3 (April 2016) with a hypopnea requiring 4% desaturations.  The channels recorded and monitored were frontal, central and occipital EEG, electrooculogram (EOG), submentalis EMG (chin), nasal and oral airflow, thoracic and abdominal wall motion, anterior tibialis EMG, snore microphone, electrocardiogram, and pulse oximetry. Continuous positive airway pressure (CPAP) was initiated at the beginning of the study and titrated to treat sleep-disordered breathing.  MEDICATIONS Medications self-administered by patient taken the night of the study : N/A  TECHNICIAN COMMENTS Comments added by technician: Patient was restless all through the night.  Comments added by scorer: N/A  RESPIRATORY PARAMETERS Optimal PAP Pressure (cm): 7 AHI at Optimal Pressure (/hr): 0.0 Overall Minimal O2 (%): 86.00 Supine % at Optimal Pressure (%): 0 Minimal O2 at Optimal Pressure (%): 91.0    SLEEP ARCHITECTURE The study was initiated at 10:48:22 PM and ended at 5:19:04 AM.  Sleep onset time was 3.0 minutes and the sleep efficiency was 82.4%. The total sleep time was 322.0 minutes.  The patient spent 10.25% of the night in stage N1 sleep, 71.12% in stage N2 sleep, 0.00% in stage N3 and 18.63% in REM.Stage REM latency was 86.0 minutes  Wake after sleep onset was 65.7. Alpha intrusion was absent. Supine sleep was 37.11%.  CARDIAC DATA The 2 lead EKG demonstrated sinus  rhythm. The mean heart rate was 62.68 beats per minute. Other EKG findings include: None.  LEG MOVEMENT DATA The total Periodic Limb Movements of Sleep (PLMS) were 0. The PLMS index was 0.00. A PLMS index of <15 is considered normal in adults.  IMPRESSIONS - She did well with CPAP 7 cm H2O.  She did not require supplemental oxygen during this study.  DIAGNOSIS - Obstructive Sleep Apnea (327.23 [G47.33 ICD-10])  RECOMMENDATIONS - Trial of CPAP therapy on 7 cm H2O with a Medium size Philips Respironics Full Face Mask Amara View mask and heated humidification.  [Electronically signed] 11/17/2017 02:46 PM  Chesley Mires MD, [place], American Board of Sleep Medicine   NPI: [id number]

## 2017-11-26 ENCOUNTER — Ambulatory Visit: Payer: Medicare HMO | Admitting: Adult Health

## 2017-11-28 ENCOUNTER — Ambulatory Visit (INDEPENDENT_AMBULATORY_CARE_PROVIDER_SITE_OTHER): Payer: Medicare HMO | Admitting: Acute Care

## 2017-11-28 ENCOUNTER — Encounter: Payer: Self-pay | Admitting: Acute Care

## 2017-11-28 VITALS — BP 124/70 | HR 73 | Ht 62.0 in | Wt 177.8 lb

## 2017-11-28 DIAGNOSIS — G4733 Obstructive sleep apnea (adult) (pediatric): Secondary | ICD-10-CM | POA: Diagnosis not present

## 2017-11-28 NOTE — Patient Instructions (Signed)
It is nice to meet you today. DME per the patients insurance. She has used [facility] before when she had a different insurance. We will order a CPAP machine. We will place an order for Auto Set of 5-15 cm H2O. Please use the mask you were given during the CPAP titration. We will place an order for equipment and supplies. Please enroll patient in [employer] in 30-90 days after starting to use the device with download Continue on CPAP at bedtime.  Goal is to wear for at least 6 hours each night for maximal clinical benefit. Continue to work on weight loss, as the link between excess weight  and sleep apnea is well established.  Do not drive if sleepy. Remember to clean mask, tubing, filter, and reservoir once weekly with soapy water.  Follow up with  Judson Roch NP or Dr. Halford Chessman in 30-90 days. Please contact office for sooner follow up if symptoms do not improve or worsen or seek emergency care

## 2017-11-28 NOTE — Assessment & Plan Note (Signed)
CPAP titration indicates mild OSA with optimal pressure of 7 cm H20 Patient wants to proceed with CPAP therapy Plan: We will order a CPAP machine. We will place an order for Auto Set of 5-15 cm H2O. Please use the mask you were given during the CPAP titration. We will place an order for equipment and supplies. Please enroll patient in [employer] in 30-90 days after starting to use the device with download Continue on CPAP at bedtime.  Goal is to wear for at least 6 hours each night for maximal clinical benefit. Continue to work on weight loss, as the link between excess weight  and sleep apnea is well established.  Do not drive if sleepy. Remember to clean mask, tubing, filter, and reservoir once weekly with soapy water.  Follow up with  Judson Roch NP or Dr. Halford Chessman in 30-90 days. Please contact office for sooner follow up if symptoms do not improve or worsen or seek emergency care

## 2017-11-28 NOTE — Progress Notes (Signed)
History of Present Illness Maria [name] is a 76 y.o. female with OSA. She was  followed by Dr. Corrie [name],  Synopsis: 76 yo female seen for sleep consult 02/2017 for OSA . She was dx with OSA in 2008 with mild to moderate OSA . She wore CPAP  from 2008 to 2015 but machine broke and she did not start back on it. Set up for sleep study done on 07/2017 that showed mild OSA with AHI 6/hr. , mild o2 desat 85%. MPLM . She was given options for treatment at her 09/24/2017 office visit with Maria Edison, NP. She opted for a CPAP titration study for treatment with CPAP as she had been having daytime sleepiness and snoring with sleep.   11/28/2017 Follow up after CPAP Titration study: Pt. Presents for follow up. She had a CPAP titration study with recommendations for optimal pressure of 7 cm H2O. We will order her machine with settings of auto set We will have her return in 31-90 days for download to ensure she is receiving therapeutic benefit from her treatment. She was instructed on use, and care. We discussed appropriate bedtime regimen to promote sleep. She state she wants to use the mask she was given during her CPAP titration. She denies fever, chest pain, orthopnea or hemoptysis   Test Results:  CPAP Titration study 11/15/2017>>  IMPRESSIONS - She did well with CPAP 7 cm H2O.  She did not require supplemental oxygen during this study. RECOMMENDATIONS - Trial of CPAP therapy on 7 cm H2O with a Medium size Philips Respironics Full Face Mask Amara View mask and heated humidification.  - Sleep Study 07/2017>> mild OSA with AHI 6/hr. , mild o2 desat 85%. MPLM .  CBC Latest Ref Rng & Units 12/01/2016 04/27/2015 11/10/2014  WBC 4.0 - 10.5 K/uL 7.3 8.6 8.2  Hemoglobin 12.0 - 15.0 g/dL 14.2 13.9 14.8  Hematocrit 36.0 - 46.0 % 42.5 41.0 45.4  Platelets 150.0 - 400.0 K/uL 268.0 308.0 302.0    BMP Latest Ref Rng & Units 12/01/2016 11/10/2014 10/23/2013  Glucose 70 - 99 mg/dL 104(H) 91 95  BUN 6 - 23  mg/dL _0 Creatinine 0.40 - 1.20 mg/dL 0.71 0.7 0.6  Sodium 135 - 145 mEq/L 140 138 138  Potassium 3.5 - 5.1 mEq/L 3.4(L) 3.6 4.1  Chloride 96 - 112 mEq/L 102 101 100  CO2 19 - 32 mEq/L 30 28 33(H)  Calcium 8.4 - 10.5 mg/dL 9.7 9.5 9.6     PFT    Component Value Date/Time   FEV1PRE 1.42 04/17/2017 1058   FEV1POST 1.45 04/17/2017 1058   FVCPRE 1.63 04/17/2017 1058   FVCPOST 1.69 04/17/2017 1058   TLC 3.72 04/17/2017 1058   DLCOUNC 14.90 04/17/2017 1058   PREFEV1FVCRT 87 04/17/2017 1058   PSTFEV1FVCRT 86 04/17/2017 1058    No results found.   Past medical hx Past Medical History:  Diagnosis Date  . Anemia   . Arthritis   . Carpal tunnel syndrome   . Diverticulosis   . GERD (gastroesophageal reflux disease)   . Headache(784.0)    chronic  . High cholesterol   . Hyperlipidemia   . Hypertension   . Migraine   . Nephritis   . OSA (obstructive sleep apnea)   . Palpitations   . Personal history of colonic adenomas 09/15/2010  . TIA (transient ischemic attack)   . Vertigo, benign paroxysmal      Social History   Tobacco Use  .  Smoking status: Former Smoker    Packs/day: 1.00    Years: 10.00    Pack years: 10.00    Types: Cigarettes    Last attempt to quit: 12/18/1969    Years since quitting: 47.9  . Smokeless tobacco: Never Used  Substance Use Topics  . Alcohol use: Yes    Comment: ocassional use  . Drug use: No    Maria [name] reports that she quit smoking about 47 years ago. Her smoking use included cigarettes. She has a 10.00 pack-year smoking history. she has never used smokeless tobacco. She reports that she drinks alcohol. She reports that she does not use drugs.  Tobacco Cessation: Former smoker, Quit 1971  Past surgical hx, Family hx, Social hx all reviewed.  Current Outpatient Medications on File Prior to Visit  Medication Sig  . albuterol (PROAIR HFA) 108 (90 Base) MCG/ACT inhaler INHALE 2 PUFFS INTO THE LUNCH EVERY 6 HOURS AS NEEDED FOR  WHEEZING OR SHORTNESS OF BREATH  . amLODipine (NORVASC) 5 MG tablet Take 1 tablet (5 mg total) by mouth daily.  Marland Kitchen aspirin 81 MG tablet Take 81 mg by mouth daily.  . Azelastine-Fluticasone (DYMISTA) 137-50 MCG/ACT SUSP Place 1 puff into the nose 2 (two) times daily. (Patient not taking: Reported on 09/24/2017)  . b complex vitamins tablet Take 1 tablet by mouth daily.    . budesonide-formoterol (SYMBICORT) 160-4.5 MCG/ACT inhaler Inhale 2 puffs into the lungs 2 (two) times daily.  Marland Kitchen CALCIUM PO Take by mouth daily.    . Cholecalciferol (VITAMIN D-3 PO) Take by mouth daily.    . clopidogrel (PLAVIX) 75 MG tablet Take 1 tablet (75 mg total) by mouth daily.  . hydrochlorothiazide (MICROZIDE) 12.5 MG capsule Take 1 capsule (12.5 mg total) by mouth daily.  Marland Kitchen levocetirizine (XYZAL) 5 MG tablet Take 1 tablet (5 mg total) by mouth every evening.  . magnesium oxide (MAG-OX) 400 MG tablet Take 400 mg by mouth daily.  . montelukast (SINGULAIR) 10 MG tablet Take 1 tablet (10 mg total) by mouth at bedtime.  . nitrofurantoin, macrocrystal-monohydrate, (MACROBID) 100 MG capsule Take 1 capsule (100 mg total) by mouth 2 (two) times daily.  . potassium chloride (KLOR-CON 10) 10 MEQ tablet Take 1 tablet (10 mEq total) by mouth daily.  . simvastatin (ZOCOR) 10 MG tablet Take 1 tablet (10 mg total) by mouth daily at 6 PM.   No current facility-administered medications on file prior to visit.      Allergies  Allergen Reactions  . Amoxicillin Hives  . Ace Inhibitors Other (See Comments)    unknown  . Bee Venom   . Benicar [Olmesartan Medoxomil] Other (See Comments)    Tongue swelling  . Codeine     REACTION: nausea  . Lisinopril Other (See Comments)    unknown  . Macrobid [Nitrofurantoin Monohyd Macro]     Pulmonary Fibrosis  . Nutritional Supplements     Bee stings cause angioedema-carry an EPIPEN    Review Of Systems:  Constitutional:   No  weight loss, night sweats,  Fevers, chills, fatigue, or   Lassitude., endorses daytime sleepiness  HEENT:   No headaches,  Difficulty swallowing,  Tooth/dental problems, or  Sore throat,                No sneezing, itching, ear ache, nasal congestion, post nasal drip,   CV:  No chest pain,  Orthopnea, PND, swelling in lower extremities, anasarca, dizziness, palpitations, syncope.   GI  No heartburn, indigestion,  abdominal pain, nausea, vomiting, diarrhea, change in bowel habits, loss of appetite, bloody stools.   Resp: No shortness of breath with exertion or at rest.  No excess mucus, no productive cough,  No non-productive cough,  No coughing up of blood.  No change in color of mucus.  No wheezing.  No chest wall deformity  Skin: no rash or lesions.  GU: no dysuria, change in color of urine, no urgency or frequency.  No flank pain, no hematuria   MS:  No joint pain or swelling.  No decreased range of motion.  No back pain.  Psych:  No change in mood or affect. No depression or anxiety.  No memory loss.   Vital Signs There were no vitals taken for this visit.   Physical Exam:  General- No distress,  A&Ox3,  ENT: No sinus tenderness, TM clear, pale nasal mucosa, no oral exudate,no post nasal drip, no LAN Cardiac: S1, S2, regular rate and rhythm, no murmur Chest: No wheeze/ rales/ dullness; no accessory muscle use, no nasal flaring, no sternal retractions Abd.: Soft Non-tender, Non-distended,obese Ext: No clubbing cyanosis, edema Neuro:  normal strength, cranial nerves intact, appropriate Skin: No rashes, warm and dry Psych: normal mood and behavior   Assessment/Plan  No problem-specific Assessment & Plan notes found for this encounter.    Magdalen Spatz, NP 11/28/2017  1:37 PM

## 2017-11-29 NOTE — Progress Notes (Signed)
I have reviewed and agree with assessment/plan.  Chesley Mires, MD [facility] Pulmonary/Critical Care 11/29/2017, 8:43 AM Pager:  [phone]

## 2017-12-26 DIAGNOSIS — R351 Nocturia: Secondary | ICD-10-CM | POA: Diagnosis not present

## 2017-12-26 DIAGNOSIS — N3946 Mixed incontinence: Secondary | ICD-10-CM | POA: Diagnosis not present

## 2018-01-01 ENCOUNTER — Encounter: Payer: Medicare HMO | Admitting: Internal Medicine

## 2018-01-08 DIAGNOSIS — G4733 Obstructive sleep apnea (adult) (pediatric): Secondary | ICD-10-CM | POA: Diagnosis not present

## 2018-01-09 ENCOUNTER — Encounter: Payer: Self-pay | Admitting: Internal Medicine

## 2018-01-09 ENCOUNTER — Ambulatory Visit (INDEPENDENT_AMBULATORY_CARE_PROVIDER_SITE_OTHER): Payer: Medicare HMO | Admitting: Internal Medicine

## 2018-01-09 VITALS — BP 138/86 | HR 79 | Temp 97.9°F | Ht 62.0 in | Wt 171.0 lb

## 2018-01-09 DIAGNOSIS — H8110 Benign paroxysmal vertigo, unspecified ear: Secondary | ICD-10-CM | POA: Diagnosis not present

## 2018-01-09 DIAGNOSIS — E041 Nontoxic single thyroid nodule: Secondary | ICD-10-CM

## 2018-01-09 DIAGNOSIS — R7302 Impaired glucose tolerance (oral): Secondary | ICD-10-CM | POA: Diagnosis not present

## 2018-01-09 DIAGNOSIS — I10 Essential (primary) hypertension: Secondary | ICD-10-CM | POA: Diagnosis not present

## 2018-01-09 NOTE — Patient Instructions (Signed)
You will be contacted regarding the referral for: thyroid ultrasound  Please continue all other medications as before, and refills have been done if requested.  Please have the pharmacy call with any other refills you may need.  Please keep your appointments with your specialists as you may have planned  Please go to the LAB in the Basement (turn left off the elevator) for the tests to be done today  You will be contacted by phone if any changes need to be made immediately.  Otherwise, you will receive a letter about your results with an explanation, but please check with MyChart first.  Please remember to sign up for MyChart if you have not done so, as this will be important to you in the future with finding out test results, communicating by private email, and scheduling acute appointments online when needed.

## 2018-01-09 NOTE — Progress Notes (Signed)
Subjective:    Patient ID: Maria [name], female    DOB: 25-Sep-1941, 77 y.o.   MRN: [id number]  HPI  Here after recent [facility] screen testing with ok carotid exam per pt report, but finding of thyroid abnormality ? Nodule, recommended to f/u here for further evaluation and tx.  Denies hyper or hypo thyroid symptoms such as voice, skin or hair change.  Pt denies chest pain, increased sob or doe, wheezing, orthopnea, PND, increased LE swelling, palpitations, or syncope but does have intermittent vertigo, mild..  Pt denies new neurological symptoms such as new headache, or facial or extremity weakness or numbness   Pt denies polydipsia, polyuria  Also mentions chronic right upper chest lipoma, as well as right sternoclavicular joint enlargement of which she was unaware prior to the screening.  To start CPAP soon per pulm for recent dx OSA.    BP Readings from Last 3 Encounters:  01/09/18 138/86  11/28/17 124/70  09/24/17 122/74  Due for mult labs not done in April 2018 per pulm Past Medical History:  Diagnosis Date  . Anemia   . Arthritis   . Carpal tunnel syndrome   . Diverticulosis   . GERD (gastroesophageal reflux disease)   . Headache(784.0)    chronic  . High cholesterol   . Hyperlipidemia   . Hypertension   . Migraine   . Nephritis   . OSA (obstructive sleep apnea)   . Palpitations   . Personal history of colonic adenomas 09/15/2010  . TIA (transient ischemic attack)   . Vertigo, benign paroxysmal    Past Surgical History:  Procedure Laterality Date  . BLADDER REPAIR    . BREAST BIOPSY     rt benign cyst  . CARPAL TUNNEL RELEASE     x 2  . COLONOSCOPY    . REPLACEMENT TOTAL KNEE BILATERAL    . TONSILLECTOMY    . UPPER GASTROINTESTINAL ENDOSCOPY      reports that she quit smoking about 48 years ago. Her smoking use included cigarettes. She has a 10.00 pack-year smoking history. she has never used smokeless tobacco. She reports that she drinks alcohol. She reports  that she does not use drugs. family history includes Aneurysm in her father; Chronic bronchitis in her mother; Colon cancer in her maternal grandmother; Colon polyps in her mother; Diabetes in her brother and father; Emphysema in her maternal grandfather; Heart disease in her mother; Hypertension in her father; Irritable bowel syndrome in her daughter; Kidney disease in her father. Allergies  Allergen Reactions  . Amoxicillin Hives  . Ace Inhibitors Other (See Comments)    unknown  . Bee Venom   . Benicar [Olmesartan Medoxomil] Other (See Comments)    Tongue swelling  . Codeine     REACTION: nausea  . Lisinopril Other (See Comments)    unknown  . Macrobid [Nitrofurantoin Monohyd Macro]     Pulmonary Fibrosis  . Nutritional Supplements     Bee stings cause angioedema-carry an EPIPEN   Current Outpatient Medications on File Prior to Visit  Medication Sig Dispense Refill  . albuterol (PROAIR HFA) 108 (90 Base) MCG/ACT inhaler INHALE 2 PUFFS INTO THE LUNCH EVERY 6 HOURS AS NEEDED FOR WHEEZING OR SHORTNESS OF BREATH 8.5 Inhaler 2  . amLODipine (NORVASC) 5 MG tablet Take 1 tablet (5 mg total) by mouth daily. 90 tablet 3  . aspirin 81 MG tablet Take 81 mg by mouth daily.    . Azelastine-Fluticasone (DYMISTA) 137-50 MCG/ACT SUSP Place  1 puff into the nose 2 (two) times daily. 6 g 5  . b complex vitamins tablet Take 1 tablet by mouth daily.      . budesonide-formoterol (SYMBICORT) 160-4.5 MCG/ACT inhaler Inhale 2 puffs into the lungs 2 (two) times daily. 3 Inhaler 3  . CALCIUM PO Take by mouth daily.      . Cholecalciferol (VITAMIN D-3 PO) Take by mouth daily.      . clopidogrel (PLAVIX) 75 MG tablet Take 1 tablet (75 mg total) by mouth daily. 90 tablet 3  . levocetirizine (XYZAL) 5 MG tablet Take 1 tablet (5 mg total) by mouth every evening. 90 tablet 3  . magnesium oxide (MAG-OX) 400 MG tablet Take 400 mg by mouth daily.    . montelukast (SINGULAIR) 10 MG tablet Take 1 tablet (10 mg total)  by mouth at bedtime. 90 tablet 3  . nitrofurantoin, macrocrystal-monohydrate, (MACROBID) 100 MG capsule Take 100 mg by mouth 2 (two) times daily.    . potassium chloride (KLOR-CON 10) 10 MEQ tablet Take 1 tablet (10 mEq total) by mouth daily. 90 tablet 3  . simvastatin (ZOCOR) 10 MG tablet Take 1 tablet (10 mg total) by mouth daily at 6 PM. 90 tablet 3  . hydrochlorothiazide (MICROZIDE) 12.5 MG capsule Take 1 capsule (12.5 mg total) by mouth daily. 90 capsule 3   No current facility-administered medications on file prior to visit.    Review of Systems  Constitutional: Negative for other unusual diaphoresis or sweats HENT: Negative for ear discharge or swelling Eyes: Negative for other worsening visual disturbances Respiratory: Negative for stridor or other swelling  Gastrointestinal: Negative for worsening distension or other blood Genitourinary: Negative for retention or other urinary change Musculoskeletal: Negative for other MSK pain or swelling Skin: Negative for color change or other new lesions Neurological: Negative for worsening tremors and other numbness  Psychiatric/Behavioral: Negative for worsening agitation or other fatigue All other system neg per pt    Objective:   Physical Exam BP 138/86   Pulse 79   Temp 97.9 F (36.6 C) (Oral)   Ht _0  (1.575 m)   Wt 171 lb (77.6 kg)   SpO2 98%   BMI 31.28 kg/m  VS noted,  Constitutional: Pt appears in NAD HENT: Head: NCAT.  Right Ear: External ear normal.  Left Ear: External ear normal.  Eyes: . Pupils are equal, round, and reactive to light. Conjunctivae and EOM are normal Nose: without d/c or deformity Neck: Neck supple. Gross normal ROM, no palpable thyroid nodule or swelling Cardiovascular: Normal rate and regular rhythm.   Pulmonary/Chest: Effort normal and breath sounds without rales or wheezing.  Abd:  Soft, NT, ND, + BS, no organomegaly Neurological: Pt is alert. At baseline orientation, motor grossly  intact Skin: Skin is warm. No rashes, other new lesions, no LE edema Psychiatric: Pt behavior is normal without agitation  No other exam findings Lab Results  Component Value Date   WBC 7.3 12/01/2016   HGB 14.2 12/01/2016   HCT 42.5 12/01/2016   PLT 268.0 12/01/2016   GLUCOSE 104 (H) 12/01/2016   CHOL 137 12/01/2016   TRIG 52.0 12/01/2016   HDL 60.30 12/01/2016   LDLDIRECT 113.2 10/23/2013   LDLCALC 66 12/01/2016   ALT 17 12/01/2016   AST 20 12/01/2016   [name] 140 12/01/2016   K 3.4 (L) 12/01/2016   CL 102 12/01/2016   CREATININE 0.71 12/01/2016   BUN 13 12/01/2016   CO2 30 12/01/2016  TSH 3.14 12/01/2016   INR 0.96 01/31/2011   HGBA1C 5.8 12/01/2016      Assessment & Plan:

## 2018-01-13 NOTE — Assessment & Plan Note (Signed)
Declines meclizine trial

## 2018-01-13 NOTE — Assessment & Plan Note (Signed)
Lab Results  Component Value Date   HGBA1C 5.8 12/01/2016  for f/u lab

## 2018-01-13 NOTE — Assessment & Plan Note (Signed)
stable overall by history and exam, recent data reviewed with pt, and pt to continue medical treatment as before,  to f/u any worsening symptoms or concerns BP Readings from Last 3 Encounters:  01/09/18 138/86  11/28/17 124/70  09/24/17 122/74

## 2018-01-15 ENCOUNTER — Ambulatory Visit: Payer: Medicare HMO | Admitting: Internal Medicine

## 2018-01-30 ENCOUNTER — Ambulatory Visit: Payer: Medicare HMO | Admitting: Acute Care

## 2018-02-04 ENCOUNTER — Other Ambulatory Visit: Payer: Self-pay | Admitting: Internal Medicine

## 2018-02-04 DIAGNOSIS — Z1231 Encounter for screening mammogram for malignant neoplasm of breast: Secondary | ICD-10-CM

## 2018-02-08 ENCOUNTER — Encounter: Payer: Self-pay | Admitting: Acute Care

## 2018-02-08 ENCOUNTER — Ambulatory Visit
Admission: RE | Admit: 2018-02-08 | Discharge: 2018-02-08 | Disposition: A | Discharge status: Home / Self Care | Payer: Medicare HMO | Source: Ambulatory Visit | Attending: Internal Medicine | Admitting: Internal Medicine

## 2018-02-08 DIAGNOSIS — G4733 Obstructive sleep apnea (adult) (pediatric): Secondary | ICD-10-CM | POA: Diagnosis not present

## 2018-02-08 DIAGNOSIS — E042 Nontoxic multinodular goiter: Secondary | ICD-10-CM | POA: Diagnosis not present

## 2018-02-08 DIAGNOSIS — E041 Nontoxic single thyroid nodule: Secondary | ICD-10-CM

## 2018-02-15 ENCOUNTER — Other Ambulatory Visit: Payer: Self-pay | Admitting: Internal Medicine

## 2018-02-18 ENCOUNTER — Other Ambulatory Visit (INDEPENDENT_AMBULATORY_CARE_PROVIDER_SITE_OTHER): Payer: Medicare HMO

## 2018-02-18 ENCOUNTER — Ambulatory Visit: Payer: Medicare HMO | Admitting: Acute Care

## 2018-02-18 DIAGNOSIS — Z0001 Encounter for general adult medical examination with abnormal findings: Secondary | ICD-10-CM | POA: Diagnosis not present

## 2018-02-18 DIAGNOSIS — E041 Nontoxic single thyroid nodule: Secondary | ICD-10-CM

## 2018-02-18 DIAGNOSIS — R7302 Impaired glucose tolerance (oral): Secondary | ICD-10-CM | POA: Diagnosis not present

## 2018-02-18 LAB — URINALYSIS, ROUTINE W REFLEX MICROSCOPIC
Bilirubin Urine: NEGATIVE
Ketones, ur: NEGATIVE
NITRITE: NEGATIVE
PH: 6.5 (ref 5.0–8.0)
Specific Gravity, Urine: 1.02 (ref 1.000–1.030)
TOTAL PROTEIN, URINE-UPE24: NEGATIVE
Urine Glucose: NEGATIVE
Urobilinogen, UA: 0.2 (ref 0.0–1.0)

## 2018-02-18 LAB — HEPATIC FUNCTION PANEL
ALBUMIN: 3.8 g/dL (ref 3.5–5.2)
ALK PHOS: 63 U/L (ref 39–117)
ALT: 20 U/L (ref 0–35)
AST: 22 U/L (ref 0–37)
Bilirubin, Direct: 0.1 mg/dL (ref 0.0–0.3)
Total Bilirubin: 0.4 mg/dL (ref 0.2–1.2)
Total Protein: 6.9 g/dL (ref 6.0–8.3)

## 2018-02-18 LAB — T4, FREE: Free T4: 0.76 ng/dL (ref 0.60–1.60)

## 2018-02-18 LAB — BASIC METABOLIC PANEL
BUN: 11 mg/dL (ref 6–23)
CO2: 28 meq/L (ref 19–32)
CREATININE: 0.66 mg/dL (ref 0.40–1.20)
Calcium: 9.6 mg/dL (ref 8.4–10.5)
Chloride: 104 mEq/L (ref 96–112)
GFR: 92.23 mL/min (ref >60.00)
Glucose, Bld: 91 mg/dL (ref 70–99)
Potassium: 3.7 mEq/L (ref 3.5–5.1)
Sodium: 139 mEq/L (ref 135–145)

## 2018-02-18 LAB — CBC WITH DIFFERENTIAL/PLATELET
BASOS ABS: 0.1 10*3/uL (ref 0.0–0.1)
Basophils Relative: 1.1 % (ref 0.0–3.0)
EOS PCT: 3.9 % (ref 0.0–5.0)
Eosinophils Absolute: 0.2 10*3/uL (ref 0.0–0.7)
HEMATOCRIT: 41.8 % (ref 36.0–46.0)
HEMOGLOBIN: 13.9 g/dL (ref 12.0–15.0)
LYMPHS ABS: 1.7 10*3/uL (ref 0.7–4.0)
LYMPHS PCT: 27.8 % (ref 12.0–46.0)
MCHC: 33.4 g/dL (ref 30.0–36.0)
MCV: 89.4 fl (ref 78.0–100.0)
MONOS PCT: 11.1 % (ref 3.0–12.0)
Monocytes Absolute: 0.7 10*3/uL (ref 0.1–1.0)
NEUTROS PCT: 56.1 % (ref 43.0–77.0)
Neutro Abs: 3.5 10*3/uL (ref 1.4–7.7)
Platelets: 306 10*3/uL (ref 150.0–400.0)
RBC: 4.67 Mil/uL (ref 3.87–5.11)
RDW: 13.9 % (ref 11.5–15.5)
WBC: 6.2 10*3/uL (ref 4.0–10.5)

## 2018-02-18 LAB — HEMOGLOBIN A1C: HEMOGLOBIN A1C: 5.9 % (ref 4.6–6.5)

## 2018-02-18 LAB — TSH: TSH: 1.57 u[IU]/mL (ref 0.35–4.50)

## 2018-02-21 ENCOUNTER — Ambulatory Visit: Payer: Medicare HMO | Admitting: Acute Care

## 2018-02-21 ENCOUNTER — Other Ambulatory Visit: Payer: Self-pay | Admitting: *Deleted

## 2018-02-21 ENCOUNTER — Other Ambulatory Visit: Payer: Self-pay | Admitting: Internal Medicine

## 2018-02-21 NOTE — Telephone Encounter (Signed)
Copied from [place] [id number]. Topic: Quick Communication - Rx Refill/Question >> Feb 21, 2018 12:25 PM Cleaster Corin, NT wrote: Medication: clopidogrel (PLAVIX) 75 MG tablet [[id number]]     montelukast (SINGULAIR) 10 MG tablet [[id number]]  simvastatin (ZOCOR) 10 MG tablet [[id number]]    levocetirizine (XYZAL) 5 MG tablet [[id number]]   Has the patient contacted their pharmacy? yes   (Agent: If no, request that the patient contact the pharmacy for the refill.)   Preferred Pharmacy (with phone number or street name):[id number] [place]Phone: [phone]: [phone]   Agent: Please be advised that RX refills may take up to 3 business days. We ask that you follow-up with your pharmacy.

## 2018-02-21 NOTE — Telephone Encounter (Signed)
Patient is awaiting her mail order and needs a 1 week supply of the following medications called to the local pharmacy. Rx called in #7 per patient request.

## 2018-02-21 NOTE — Progress Notes (Deleted)
History of Present Illness Maria [name] is a 77 y.o. female former smoker ( Quit 1071) with with OSA. She was  followed by Dr. Corrie Dandy.  Synopsis: 77 yo female seen for sleep consult 02/2017 for OSA . She was dx with OSA in 2008 with mild to moderate OSA . She wore CPAP  from 2008 to 2015 but machine broke and she did not start back on it. Set up for sleep study done on 07/2017 that showed mild OSA with AHI 6/hr. , mild o2 desat 85%. MPLM .She was given options for treatment at her 09/24/2017 office visit with Rexene Edison, NP. She opted for a CPAP titration study for treatment with CPAP as she had been having daytime sleepiness and snoring with sleep.     02/21/2018 Pt. Returns for 3 month follow up. She was last seen 11/2017. She had a CPAP titration 10/2017. Recommendation was for CPAP set pressure of 7 cm H2O. She returns for follow up to ensure she is receiving therapeutic benefit from her treatment at the new setting of 7 cm H2O. She states she has been    Test Results: CPAP Titration study 11/15/2017>>  IMPRESSIONS - She did well with CPAP 7 cm H2O. She did not require supplemental oxygen during this study. RECOMMENDATIONS - Trial of CPAP therapy on 7 cm H2O with a Medium size [employer] Respironics Full Face Mask Amara View mask and heated humidification.  - Sleep Study 07/2017>> mild OSA with AHI 6/hr. , mild o2 desat 85%. MPLM .  CBC Latest Ref Rng & Units 02/18/2018 12/01/2016 04/27/2015  WBC 4.0 - 10.5 K/uL 6.2 7.3 8.6  Hemoglobin 12.0 - 15.0 g/dL 13.9 14.2 13.9  Hematocrit 36.0 - 46.0 % 41.8 42.5 41.0  Platelets 150.0 - 400.0 K/uL 306.0 268.0 308.0    BMP Latest Ref Rng & Units 02/18/2018 12/01/2016 11/10/2014  Glucose 70 - 99 mg/dL 91 104(H) 91  BUN 6 - 23 mg/dL _0 Creatinine 0.40 - 1.20 mg/dL 0.66 0.71 0.7  Sodium 135 - 145 mEq/L 139 140 138  Potassium 3.5 - 5.1 mEq/L 3.7 3.4(L) 3.6  Chloride 96 - 112 mEq/L 104 102 101  CO2 19 - 32 mEq/L _1 Calcium 8.4 -  10.5 mg/dL 9.6 9.7 9.5    BNP No results found for: BNP  ProBNP No results found for: PROBNP  PFT    Component Value Date/Time   FEV1PRE 1.42 04/17/2017 1058   FEV1POST 1.45 04/17/2017 1058   FVCPRE 1.63 04/17/2017 1058   FVCPOST 1.69 04/17/2017 1058   TLC 3.72 04/17/2017 1058   DLCOUNC 14.90 04/17/2017 1058   PREFEV1FVCRT 87 04/17/2017 1058   PSTFEV1FVCRT 86 04/17/2017 1058    US Thyroid  Result Date: 02/08/2018 CLINICAL DATA:  Thyroid nodule EXAM: THYROID ULTRASOUND TECHNIQUE: Ultrasound examination of the thyroid gland and adjacent soft tissues was performed. COMPARISON:  None. FINDINGS: Parenchymal Echotexture: Mildly heterogenous Isthmus: 0.3 cm thickness Right lobe: 3.6 x 1.6 x 1.6 cm Left lobe: 3.9 x 1.7 x 1.9 cm _________________________________________________________ Estimated total number of nodules >/= 1 cm: 2 Number of spongiform nodules >/=  2 cm not described below (TR1): 0 Number of mixed cystic and solid nodules >/= 1.5 cm not described below (TR2): 0 _________________________________________________________ Nodule # 1: Location: Right; Mid Maximum size: 1 cm; Other 2 dimensions: 0.8 x 1 cm Composition: solid/almost completely solid (2) Echogenicity: hypoechoic (2) Shape: not taller-than-wide (0) Margins: smooth (0) Echogenic foci: none (0)  ACR TI-RADS total points: 4. ACR TI-RADS risk category: TR4 (4-6 points). ACR TI-RADS recommendations: *Given size (>/= 1 - 1.4 cm) and appearance, a follow-up ultrasound in 1 year should be considered based on TI-RADS criteria. _________________________________________________________ Nodule # 2: Location: Left; Mid Maximum size: 1.4 cm; Other 2 dimensions: 0.9 x 1.3 cm Composition: solid/almost completely solid (2) Echogenicity: isoechoic (1) Shape: not taller-than-wide (0) Margins: smooth (0) Echogenic foci: none (0) ACR TI-RADS total points: 3. ACR TI-RADS risk category: TR3 (3 points). ACR TI-RADS recommendations: Given size (<1.4 cm)  and appearance, this nodule does NOT meet TI-RADS criteria for biopsy or dedicated follow-up. _________________________________________________________ Additional scattered small hypoechoic nodules without calcifications bilaterally, none greater than 0.7 cm. IMPRESSION: 1. Normal-sized thyroid with small bilateral nodules. None meets criteria for biopsy. 2. Recommend follow-up 1 year surveillance ultrasound to confirm stability. The above is in keeping with the ACR TI-RADS recommendations - J Am Coll Radiol 2017;14:587-595. Electronically Signed   By: Lucrezia Europe M.D.   On: 02/08/2018 15:22     Past medical hx Past Medical History:  Diagnosis Date  . Anemia   . Arthritis   . Carpal tunnel syndrome   . Diverticulosis   . GERD (gastroesophageal reflux disease)   . Headache(784.0)    chronic  . High cholesterol   . Hyperlipidemia   . Hypertension   . Migraine   . Nephritis   . OSA (obstructive sleep apnea)   . Palpitations   . Personal history of colonic adenomas 09/15/2010  . TIA (transient ischemic attack)   . Vertigo, benign paroxysmal      Social History   Tobacco Use  . Smoking status: Former Smoker    Packs/day: 1.00    Years: 10.00    Pack years: 10.00    Types: Cigarettes    Last attempt to quit: 12/18/1969    Years since quitting: 48.2  . Smokeless tobacco: Never Used  Substance Use Topics  . Alcohol use: Yes    Comment: ocassional use  . Drug use: No    Maria [name] reports that she quit smoking about 48 years ago. Her smoking use included cigarettes. She has a 10.00 pack-year smoking history. she has never used smokeless tobacco. She reports that she drinks alcohol. She reports that she does not use drugs.  Tobacco Cessation: Former smoker, quit 1971  Past surgical hx, Family hx, Social hx all reviewed.  Current Outpatient Medications on File Prior to Visit  Medication Sig  . albuterol (PROAIR HFA) 108 (90 Base) MCG/ACT inhaler INHALE 2 PUFFS INTO THE LUNCH EVERY  6 HOURS AS NEEDED FOR WHEEZING OR SHORTNESS OF BREATH  . amLODipine (NORVASC) 5 MG tablet TAKE 1 TABLET EVERY DAY  . aspirin 81 MG tablet Take 81 mg by mouth daily.  . Azelastine-Fluticasone (DYMISTA) 137-50 MCG/ACT SUSP Place 1 puff into the nose 2 (two) times daily.  Marland Kitchen b complex vitamins tablet Take 1 tablet by mouth daily.    . budesonide-formoterol (SYMBICORT) 160-4.5 MCG/ACT inhaler Inhale 2 puffs into the lungs 2 (two) times daily.  Marland Kitchen CALCIUM PO Take by mouth daily.    . Cholecalciferol (VITAMIN D-3 PO) Take by mouth daily.    . clopidogrel (PLAVIX) 75 MG tablet TAKE 1 TABLET EVERY DAY  . hydrochlorothiazide (MICROZIDE) 12.5 MG capsule TAKE 1 CAPSULE EVERY DAY  . levocetirizine (XYZAL) 5 MG tablet TAKE 1 TABLET EVERY EVENING  . magnesium oxide (MAG-OX) 400 MG tablet Take 400 mg by mouth daily.  . montelukast (  SINGULAIR) 10 MG tablet TAKE 1 TABLET AT BEDTIME  . nitrofurantoin, macrocrystal-monohydrate, (MACROBID) 100 MG capsule Take 100 mg by mouth 2 (two) times daily.  . potassium chloride (K-DUR) 10 MEQ tablet TAKE 1 TABLET EVERY DAY  . simvastatin (ZOCOR) 10 MG tablet TAKE 1 TABLET EVERY DAY  AT  6  PM   No current facility-administered medications on file prior to visit.      Allergies  Allergen Reactions  . Amoxicillin Hives  . Ace Inhibitors Other (See Comments)    unknown  . Bee Venom   . Benicar [Olmesartan Medoxomil] Other (See Comments)    Tongue swelling  . Codeine     REACTION: nausea  . Lisinopril Other (See Comments)    unknown  . Macrobid [Nitrofurantoin Monohyd Macro]     Pulmonary Fibrosis  . Nutritional Supplements     Bee stings cause angioedema-carry an EPIPEN    Review Of Systems:  Constitutional:   No  weight loss, night sweats,  Fevers, chills, fatigue, or  lassitude.  HEENT:   No headaches,  Difficulty swallowing,  Tooth/dental problems, or  Sore throat,                No sneezing, itching, ear ache, nasal congestion, post nasal drip,   CV:   No chest pain,  Orthopnea, PND, swelling in lower extremities, anasarca, dizziness, palpitations, syncope.   GI  No heartburn, indigestion, abdominal pain, nausea, vomiting, diarrhea, change in bowel habits, loss of appetite, bloody stools.   Resp: No shortness of breath with exertion or at rest.  No excess mucus, no productive cough,  No non-productive cough,  No coughing up of blood.  No change in color of mucus.  No wheezing.  No chest wall deformity  Skin: no rash or lesions.  GU: no dysuria, change in color of urine, no urgency or frequency.  No flank pain, no hematuria   MS:  No joint pain or swelling.  No decreased range of motion.  No back pain.  Psych:  No change in mood or affect. No depression or anxiety.  No memory loss.   Vital Signs There were no vitals taken for this visit.   Physical Exam:  General- No distress,  A&Ox3 ENT: No sinus tenderness, TM clear, pale nasal mucosa, no oral exudate,no post nasal drip, no LAN Cardiac: S1, S2, regular rate and rhythm, no murmur Chest: No wheeze/ rales/ dullness; no accessory muscle use, no nasal flaring, no sternal retractions Abd.: Soft Non-tender Ext: No clubbing cyanosis, edema Neuro:  normal strength Skin: No rashes, warm and dry Psych: normal mood and behavior   Assessment/Plan  No problem-specific Assessment & Plan notes found for this encounter.    Magdalen Spatz, NP 02/21/2018  8:14 AM

## 2018-02-22 DIAGNOSIS — N3946 Mixed incontinence: Secondary | ICD-10-CM | POA: Diagnosis not present

## 2018-02-22 DIAGNOSIS — R350 Frequency of micturition: Secondary | ICD-10-CM | POA: Diagnosis not present

## 2018-02-26 ENCOUNTER — Ambulatory Visit
Admission: RE | Admit: 2018-02-26 | Discharge: 2018-02-26 | Disposition: A | Discharge status: Home / Self Care | Payer: Medicare HMO | Source: Ambulatory Visit | Attending: Internal Medicine | Admitting: Internal Medicine

## 2018-02-26 DIAGNOSIS — Z1231 Encounter for screening mammogram for malignant neoplasm of breast: Secondary | ICD-10-CM | POA: Diagnosis not present

## 2018-02-27 ENCOUNTER — Other Ambulatory Visit: Payer: Self-pay | Admitting: Internal Medicine

## 2018-02-27 DIAGNOSIS — R928 Other abnormal and inconclusive findings on diagnostic imaging of breast: Secondary | ICD-10-CM

## 2018-03-05 ENCOUNTER — Ambulatory Visit: Payer: Medicare HMO

## 2018-03-05 ENCOUNTER — Ambulatory Visit
Admission: RE | Admit: 2018-03-05 | Discharge: 2018-03-05 | Disposition: A | Discharge status: Home / Self Care | Payer: Medicare HMO | Source: Ambulatory Visit | Attending: Internal Medicine | Admitting: Internal Medicine

## 2018-03-05 DIAGNOSIS — R928 Other abnormal and inconclusive findings on diagnostic imaging of breast: Secondary | ICD-10-CM | POA: Diagnosis not present

## 2018-03-08 DIAGNOSIS — G4733 Obstructive sleep apnea (adult) (pediatric): Secondary | ICD-10-CM | POA: Diagnosis not present

## 2018-03-11 ENCOUNTER — Encounter: Payer: Self-pay | Admitting: Acute Care

## 2018-03-11 ENCOUNTER — Ambulatory Visit: Payer: Medicare HMO | Admitting: Acute Care

## 2018-03-11 DIAGNOSIS — J209 Acute bronchitis, unspecified: Secondary | ICD-10-CM

## 2018-03-11 DIAGNOSIS — G4733 Obstructive sleep apnea (adult) (pediatric): Secondary | ICD-10-CM

## 2018-03-11 NOTE — Progress Notes (Signed)
Reviewed and agree with assessment/plan.   Jenisa Monty, MD [facility] 12/13/2016, 12:24 PM Pager:  [phone]  

## 2018-03-11 NOTE — Assessment & Plan Note (Signed)
Stable interval Not compliant with Symbicort/ Singulair due to cost Plan: Resume your Symbicort 2 puffs twice daily Continue your Singulair Follow up as above. Please contact office for sooner follow up if symptoms do not improve or worsen or seek emergency care

## 2018-03-11 NOTE — Assessment & Plan Note (Addendum)
Good Control on current Auto Set Pressures AHI is 1.2 Plan: Continue on CPAP at bedtime. You appear to be benefiting from the treatment Goal is to wear for at least 6 hours each night for maximal clinical benefit. Continue to work on weight loss, as the link between excess weight  and sleep apnea is well established.  Do not drive if sleepy. Remember to clean mask, tubing, filter, and reservoir once weekly with soapy water, or use the So Clean..  Follow up with Dr. Halford Chessman or Judson Roch or Dr. Halford Chessman in 3 months to see if your pressures come down. Resume Protonix at bedtime. Follow up as above. Please contact office for sooner follow up if symptoms do not improve or worsen or seek emergency care

## 2018-03-11 NOTE — Progress Notes (Signed)
History of Present Illness Maria [name] is a 77 y.o. female former smoker  ( Quit [phone]) with OSA on CPAP, and chronic bronchitis. She is followed by Dr. Halford Chessman   03/11/2018  CPAP Follow up.  Pt. Presents for follow up. Pt. States she has finally found the right mask to prevent leaks.She is doing better with tolerating the mask and CPAP therapy since using the new mask. She states she is on a full face mask. She states she is sleeping better. She is no longer waking up every 2 hours through the night. She states her focus is better. She does have some daytime sleepiness and fatigue. She has been on a liver cleanse and she thinks it may be due to that. She states she has lost some weight. She is working on continuing to lose weight.She also notes she is not getting enough exercise..She is not taking her Symbicort and she is not using her Protonix.She is not using her nasal spray. She states the reason is financial.She denies fever, chest pain, orthopnea or hemoptysis. We reviewed the reasons to use her medications. I have asked her to call her Insurance company to ensure she has access to the least expensive drugs in the drug class we are prescribing.   Test Results: Down Load:   AirSense 10 AutoSet Auto Set 5-15 cm H2O Usage >> 30/30 days>> 100% > 4 hours>> 28 days >> 93% < 4 hours 2 days >> 7% Average use days used>> 6 hours 11 minutes Median pressure 11.8 cm H2O Max pressure >> 14.7 cm H2O ( States she had to have them increase pressure 2/2 mask leak, which has now resolved with new mask) AHI= 1.2  CPAP titration suggested optimal pressure of 7 cm H2O Based on above pressure needs with good control will leave on autoset 5-15 and see if pressures are lower now we have good mask fit.  I am hesitant to decrease pressures to 7 cm H2O with increased pressure need noted on todays down load as these pressures  are controlling her OSA/ AHI. She has no complaints about pressure at  present.    CBC Latest Ref Rng & Units 02/18/2018 12/01/2016 04/27/2015  WBC 4.0 - 10.5 K/uL 6.2 7.3 8.6  Hemoglobin 12.0 - 15.0 g/dL 13.9 14.2 13.9  Hematocrit 36.0 - 46.0 % 41.8 42.5 41.0  Platelets 150.0 - 400.0 K/uL 306.0 268.0 308.0    BMP Latest Ref Rng & Units 02/18/2018 12/01/2016 11/10/2014  Glucose 70 - 99 mg/dL 91 104(H) 91  BUN 6 - 23 mg/dL _0 Creatinine 0.40 - 1.20 mg/dL 0.66 0.71 0.7  Sodium 135 - 145 mEq/L 139 140 138  Potassium 3.5 - 5.1 mEq/L 3.7 3.4(L) 3.6  Chloride 96 - 112 mEq/L 104 102 101  CO2 19 - 32 mEq/L _1 Calcium 8.4 - 10.5 mg/dL 9.6 9.7 9.5    BNP No results found for: BNP  ProBNP No results found for: PROBNP  PFT    Component Value Date/Time   FEV1PRE 1.42 04/17/2017 1058   FEV1POST 1.45 04/17/2017 1058   FVCPRE 1.63 04/17/2017 1058   FVCPOST 1.69 04/17/2017 1058   TLC 3.72 04/17/2017 1058   DLCOUNC 14.90 04/17/2017 1058   PREFEV1FVCRT 87 04/17/2017 1058   PSTFEV1FVCRT 86 04/17/2017 1058    Mm Diag Breast Tomo Uni Right  Result Date: 03/05/2018 CLINICAL DATA:  The patient was called back for right breast distortion. EXAM: DIGITAL DIAGNOSTIC UNILATERAL RIGHT MAMMOGRAM  WITH CAD AND TOMO COMPARISON:  Previous exam(s). ACR Breast Density Category b: There are scattered areas of fibroglandular density. FINDINGS: The right breast distortion is at a site of previous surgery performed in 1998. This is unchanged over many years. No suspicious findings in the right breast. Mammographic images were processed with CAD. IMPRESSION: Scarring in the upper outer right breast.  No suspicious findings. RECOMMENDATION: Annual screening mammography. I have discussed the findings and recommendations with the patient. Results were also provided in writing at the conclusion of the visit. If applicable, a reminder letter will be sent to the patient regarding the next appointment. BI-RADS CATEGORY  2: Benign. Electronically Signed   By: Dorise Bullion III M.D    On: 03/05/2018 14:49   Mm Screening Breast Tomo Bilateral  Result Date: 02/26/2018 CLINICAL DATA:  Screening. EXAM: DIGITAL SCREENING BILATERAL MAMMOGRAM WITH TOMO AND CAD COMPARISON:  Previous exam(s). ACR Breast Density Category b: There are scattered areas of fibroglandular density. FINDINGS: In the right breast, possible distortion warrants further evaluation. In the left breast, no findings suspicious for malignancy. Images were processed with CAD. IMPRESSION: Further evaluation is suggested for possible distortion in the right breast. RECOMMENDATION: Diagnostic mammogram and possibly ultrasound of the right breast. (Code:[id number]) The patient will be contacted regarding the findings, and additional imaging will be scheduled. BI-RADS CATEGORY  0: Incomplete. Need additional imaging evaluation and/or prior mammograms for comparison. Electronically Signed   By: Fidela Salisbury M.D.   On: 02/26/2018 15:16     Past medical hx Past Medical History:  Diagnosis Date  . Anemia   . Arthritis   . Carpal tunnel syndrome   . Diverticulosis   . GERD (gastroesophageal reflux disease)   . Headache(784.0)    chronic  . High cholesterol   . Hyperlipidemia   . Hypertension   . Migraine   . Nephritis   . OSA (obstructive sleep apnea)   . Palpitations   . Personal history of colonic adenomas 09/15/2010  . TIA (transient ischemic attack)   . Vertigo, benign paroxysmal      Social History   Tobacco Use  . Smoking status: Former Smoker    Packs/day: 1.00    Years: 10.00    Pack years: 10.00    Types: Cigarettes    Last attempt to quit: 12/18/1969    Years since quitting: 48.2  . Smokeless tobacco: Never Used  Substance Use Topics  . Alcohol use: Yes    Comment: ocassional use  . Drug use: No    Ms.Wey reports that she quit smoking about 48 years ago. Her smoking use included cigarettes. She has a 10.00 pack-year smoking history. She has never used smokeless tobacco. She reports that  she drinks alcohol. She reports that she does not use drugs.  Tobacco Cessation: Former smoker, Quit 1971  Past surgical hx, Family hx, Social hx all reviewed.  Current Outpatient Medications on File Prior to Visit  Medication Sig  . albuterol (PROAIR HFA) 108 (90 Base) MCG/ACT inhaler INHALE 2 PUFFS INTO THE LUNCH EVERY 6 HOURS AS NEEDED FOR WHEEZING OR SHORTNESS OF BREATH  . amLODipine (NORVASC) 5 MG tablet TAKE 1 TABLET EVERY DAY  . aspirin 81 MG tablet Take 81 mg by mouth daily.  Marland Kitchen b complex vitamins tablet Take 1 tablet by mouth daily.    Marland Kitchen CALCIUM PO Take by mouth daily.    . Cholecalciferol (VITAMIN D-3 PO) Take by mouth daily.    . clopidogrel (PLAVIX) 75  MG tablet TAKE 1 TABLET EVERY DAY  . fesoterodine (TOVIAZ) 4 MG TB24 tablet Take 4 mg by mouth daily.  . hydrochlorothiazide (MICROZIDE) 12.5 MG capsule TAKE 1 CAPSULE EVERY DAY  . levocetirizine (XYZAL) 5 MG tablet TAKE 1 TABLET EVERY EVENING  . magnesium oxide (MAG-OX) 400 MG tablet Take 400 mg by mouth daily.  . Mirabegron (MYRBETRIQ PO) Take by mouth.  . montelukast (SINGULAIR) 10 MG tablet TAKE 1 TABLET AT BEDTIME  . potassium chloride (K-DUR) 10 MEQ tablet TAKE 1 TABLET EVERY DAY  . simvastatin (ZOCOR) 10 MG tablet TAKE 1 TABLET EVERY DAY  AT  6  PM  . Azelastine-Fluticasone (DYMISTA) 137-50 MCG/ACT SUSP Place 1 puff into the nose 2 (two) times daily. (Patient not taking: Reported on 03/11/2018)  . budesonide-formoterol (SYMBICORT) 160-4.5 MCG/ACT inhaler Inhale 2 puffs into the lungs 2 (two) times daily. (Patient not taking: Reported on 03/11/2018)   No current facility-administered medications on file prior to visit.      Allergies  Allergen Reactions  . Amoxicillin Hives  . Ace Inhibitors Other (See Comments)    unknown  . Bee Venom   . Benicar [Olmesartan Medoxomil] Other (See Comments)    Tongue swelling  . Codeine     REACTION: nausea  . Lisinopril Other (See Comments)    unknown  . Macrobid  [Nitrofurantoin Monohyd Macro]     Pulmonary Fibrosis  . Nutritional Supplements     Bee stings cause angioedema-carry an EPIPEN    Review Of Systems:  Constitutional:   No  weight loss, night sweats,  Fevers, chills, fatigue, or  lassitude.  HEENT:   No headaches,  Difficulty swallowing,  Tooth/dental problems, or  Sore throat,                No sneezing, itching, ear ache, nasal congestion, post nasal drip,   CV:  No chest pain,  Orthopnea, PND, swelling in lower extremities, anasarca, dizziness, palpitations, syncope.   GI  No heartburn, indigestion, abdominal pain, nausea, vomiting, diarrhea, change in bowel habits, loss of appetite, bloody stools.   Resp: No shortness of breath with exertion or at rest.  No excess mucus, no productive cough,  No non-productive cough,  No coughing up of blood.  No change in color of mucus.  No wheezing.  No chest wall deformity  Skin: no rash or lesions.  GU: no dysuria, change in color of urine, no urgency or frequency.  No flank pain, no hematuria   MS:  No joint pain or swelling.  No decreased range of motion.  No back pain.  Psych:  No change in mood or affect. No depression or anxiety.  No memory loss.   Vital Signs BP 112/84 (BP Location: Left Arm, Cuff Size: Normal)   Pulse (!) 58   Ht _0  (1.575 m)   Wt 160 lb 9.6 oz (72.8 kg)   SpO2 96%   BMI 29.37 kg/m    Physical Exam:  General- No distress,  A&Ox3, pleasant, very intense ENT: No sinus tenderness, TM clear, pale nasal mucosa, no oral exudate,no post nasal drip, no LAN Cardiac: S1, S2, regular rate and rhythm, no murmur Chest: No wheeze/ rales/ dullness; no accessory muscle use, no nasal flaring, no sternal retractions Abd.: Soft Non-tender, non-distended, non-obese. Ext: No clubbing cyanosis, edema Neuro:  normal strength, MAE x 4 Skin: No rashes, warm and dry Psych: normal mood and behavior, very intense   Assessment/Plan  Obstructive sleep apnea Good Control  on  current Auto Set Pressures AHI is 1.2 Plan: Continue on CPAP at bedtime. You appear to be benefiting from the treatment Goal is to wear for at least 6 hours each night for maximal clinical benefit. Continue to work on weight loss, as the link between excess weight  and sleep apnea is well established.  Do not drive if sleepy. Remember to clean mask, tubing, filter, and reservoir once weekly with soapy water, or use the So Clean..  Follow up with Dr. Halford Chessman or Judson Roch or Dr. Halford Chessman in 3 months to see if your pressures come down. Resume Protonix at bedtime. Follow up as above. Please contact office for sooner follow up if symptoms do not improve or worsen or seek emergency care     Acute bronchitis Stable interval Not compliant with Symbicort/ Singulair due to cost Plan: Resume your Symbicort 2 puffs twice daily Continue your Singulair Follow up as above. Please contact office for sooner follow up if symptoms do not improve or worsen or seek emergency care       Magdalen Spatz, NP 03/11/2018  2:19 PM

## 2018-03-11 NOTE — Patient Instructions (Signed)
  It is good to see you today. Continue on CPAP at bedtime. You appear to be benefiting from the treatment Goal is to wear for at least 6 hours each night for maximal clinical benefit. Continue to work on weight loss, as the link between excess weight  and sleep apnea is well established.  Do not drive if sleepy. Remember to clean mask, tubing, filter, and reservoir once weekly with soapy water, or use the So Clean..  Follow up with Dr. Halford Chessman or Judson Roch or Dr. Halford Chessman in 3 months to see if your pressures come down. Resume Protonix at bedtime. Resume your Symbicort 2 puffs twice daily Continue your Singulair Follow up as above. Please contact office for sooner follow up if symptoms do not improve or worsen or seek emergency care

## 2018-03-22 ENCOUNTER — Telehealth: Payer: Self-pay | Admitting: Internal Medicine

## 2018-03-22 DIAGNOSIS — N390 Urinary tract infection, site not specified: Secondary | ICD-10-CM | POA: Diagnosis not present

## 2018-03-22 NOTE — Telephone Encounter (Signed)
I would encourage ROV for Sat clinic tomorrow, as we normally call in antibx on request (office policy)

## 2018-03-22 NOTE — Telephone Encounter (Signed)
Pt refused to schedule Saturday appt, she has a festival to be at tomorrow.  is unable to get into urologist as well.  Pt states that if she ends up in the hospital, she will leave the practice.

## 2018-03-22 NOTE — Telephone Encounter (Signed)
Noted.

## 2018-03-22 NOTE — Telephone Encounter (Signed)
Patient wants to try to get antibiotic from urologist.   If she can not she will call back to get scheduled on Saturday Clinic.

## 2018-03-22 NOTE — Telephone Encounter (Signed)
Copied from [place] [id number]. Topic: Quick Communication - See Telephone Encounter >> Mar 22, 2018  3:17 PM Vernona Rieger wrote: CRM for notification. See Telephone encounter for: 03/22/18.  Patient said that she has had a UTI with symptoms going on 7 days. She said that she wants Dr Jenny Reichmann to call her something in. I advised her that she needs to make an appt, she said " just have the nurse call me back, they know my history" please advise.  Call back at [phone]

## 2018-04-08 DIAGNOSIS — G4733 Obstructive sleep apnea (adult) (pediatric): Secondary | ICD-10-CM | POA: Diagnosis not present

## 2018-04-09 DIAGNOSIS — H25011 Cortical age-related cataract, right eye: Secondary | ICD-10-CM | POA: Diagnosis not present

## 2018-04-09 DIAGNOSIS — Z961 Presence of intraocular lens: Secondary | ICD-10-CM | POA: Diagnosis not present

## 2018-04-09 DIAGNOSIS — H2511 Age-related nuclear cataract, right eye: Secondary | ICD-10-CM | POA: Diagnosis not present

## 2018-04-09 DIAGNOSIS — H25041 Posterior subcapsular polar age-related cataract, right eye: Secondary | ICD-10-CM | POA: Diagnosis not present

## 2018-04-10 ENCOUNTER — Other Ambulatory Visit: Payer: Self-pay | Admitting: Internal Medicine

## 2018-04-20 ENCOUNTER — Ambulatory Visit (INDEPENDENT_AMBULATORY_CARE_PROVIDER_SITE_OTHER): Payer: Medicare HMO | Admitting: Family Medicine

## 2018-04-20 ENCOUNTER — Other Ambulatory Visit (HOSPITAL_COMMUNITY)
Admit: 2018-04-20 | Discharge: 2018-04-20 | Disposition: A | Discharge status: Home / Self Care | Payer: Medicare HMO | Source: Other Acute Inpatient Hospital | Attending: Family Medicine | Admitting: Family Medicine

## 2018-04-20 ENCOUNTER — Other Ambulatory Visit (HOSPITAL_COMMUNITY): Admit: 2018-04-20 | Payer: Self-pay | Admitting: *Deleted

## 2018-04-20 VITALS — BP 118/60 | HR 78 | Temp 98.2°F | Resp 16 | Wt 157.0 lb

## 2018-04-20 DIAGNOSIS — R300 Dysuria: Secondary | ICD-10-CM

## 2018-04-20 LAB — POC URINALSYSI DIPSTICK (AUTOMATED)
Bilirubin, UA: NEGATIVE
GLUCOSE UA: NEGATIVE
Ketones, UA: NEGATIVE
NITRITE UA: POSITIVE
SPEC GRAV UA: 1.02 (ref 1.010–1.025)
UROBILINOGEN UA: NEGATIVE U/dL — AB
pH, UA: 6 (ref 5.0–8.0)

## 2018-04-20 MED ORDER — CIPROFLOXACIN HCL 250 MG PO TABS: 250.0000 mg | ORAL_TABLET | Freq: Two times a day (BID) | ORAL | 0 refills | Status: DC

## 2018-04-20 NOTE — Progress Notes (Signed)
SUBJECTIVE: Maria [name] is a 77 y.o. female new to me, who presents to weekend clinic with, complaints of urinary frequency, urgency and dysuria x 3 days, without flank pain, fever, chills, or abnormal vaginal discharge or bleeding.   Treated with macrobid 2 weeks at [facility]  for similar symptoms which did resolve.  No cx in chart.   Current Outpatient Medications on File Prior to Visit  Medication Sig Dispense Refill  . albuterol (PROAIR HFA) 108 (90 Base) MCG/ACT inhaler INHALE 2 PUFFS INTO THE LUNCH EVERY 6 HOURS AS NEEDED FOR WHEEZING OR SHORTNESS OF BREATH 8.5 Inhaler 2  . amLODipine (NORVASC) 5 MG tablet TAKE 1 TABLET EVERY DAY 90 tablet 1  . aspirin 81 MG tablet Take 81 mg by mouth daily.    . Azelastine-Fluticasone (DYMISTA) 137-50 MCG/ACT SUSP Place 1 puff into the nose 2 (two) times daily. 6 g 5  . b complex vitamins tablet Take 1 tablet by mouth daily.      Marland Kitchen CALCIUM PO Take by mouth daily.      . Cholecalciferol (VITAMIN D-3 PO) Take by mouth daily.      . clopidogrel (PLAVIX) 75 MG tablet TAKE 1 TABLET EVERY DAY 90 tablet 1  . fesoterodine (TOVIAZ) 4 MG TB24 tablet Take 4 mg by mouth daily.    . hydrochlorothiazide (MICROZIDE) 12.5 MG capsule TAKE 1 CAPSULE EVERY DAY 90 capsule 1  . levocetirizine (XYZAL) 5 MG tablet TAKE 1 TABLET EVERY EVENING 90 tablet 1  . magnesium oxide (MAG-OX) 400 MG tablet Take 400 mg by mouth daily.    . Mirabegron (MYRBETRIQ PO) Take by mouth.    . montelukast (SINGULAIR) 10 MG tablet TAKE 1 TABLET AT BEDTIME 90 tablet 1  . potassium chloride (K-DUR) 10 MEQ tablet TAKE 1 TABLET EVERY DAY 90 tablet 1  . simvastatin (ZOCOR) 10 MG tablet TAKE 1 TABLET EVERY DAY  AT  6  PM 90 tablet 1  . SYMBICORT 160-4.5 MCG/ACT inhaler INHALE 2 PUFFS TWICE DAILY 3 Inhaler 3   No current facility-administered medications on file prior to visit.     Allergies  Allergen Reactions  . Amoxicillin Hives  . Ace Inhibitors Other (See Comments)    unknown  . Bee Venom    . Benicar [Olmesartan Medoxomil] Other (See Comments)    Tongue swelling  . Codeine     REACTION: nausea  . Lisinopril Other (See Comments)    unknown  . Macrobid [Nitrofurantoin Monohyd Macro]     Pulmonary Fibrosis  . Nutritional Supplements     Bee stings cause angioedema-carry an EPIPEN    Past Medical History:  Diagnosis Date  . Anemia   . Arthritis   . Carpal tunnel syndrome   . Diverticulosis   . GERD (gastroesophageal reflux disease)   . Headache(784.0)    chronic  . High cholesterol   . Hyperlipidemia   . Hypertension   . Migraine   . Nephritis   . OSA (obstructive sleep apnea)   . Palpitations   . Personal history of colonic adenomas 09/15/2010  . TIA (transient ischemic attack)   . Vertigo, benign paroxysmal     Past Surgical History:  Procedure Laterality Date  . BLADDER REPAIR    . BREAST BIOPSY     rt benign cyst  . CARPAL TUNNEL RELEASE     x 2  . COLONOSCOPY    . REPLACEMENT TOTAL KNEE BILATERAL    . TONSILLECTOMY    . UPPER  GASTROINTESTINAL ENDOSCOPY      Family History  Problem Relation Age of Onset  . Diabetes Father   . Aneurysm Father   . Kidney disease Father   . Hypertension Father   . Heart disease Mother   . Colon polyps Mother   . Chronic bronchitis Mother   . Diabetes Brother   . Colon cancer Maternal Grandmother   . Irritable bowel syndrome Daughter   . Emphysema Maternal Grandfather        smoked a pipe    Social History   Socioeconomic History  . Marital status: Divorced    Spouse name: Not on file  . Number of children: 3  . Years of education: Not on file  . Highest education level: Not on file  Occupational History  . Occupation: retired  Scientific laboratory technician  . Financial resource strain: Not on file  . Food insecurity:    Worry: Not on file    Inability: Not on file  . Transportation needs:    Medical: Not on file    Non-medical: Not on file  Tobacco Use  . Smoking status: Former Smoker    Packs/day: 1.00     Years: 10.00    Pack years: 10.00    Types: Cigarettes    Last attempt to quit: 12/18/1969    Years since quitting: 48.3  . Smokeless tobacco: Never Used  Substance and Sexual Activity  . Alcohol use: Yes    Comment: ocassional use  . Drug use: No  . Sexual activity: Not on file  Lifestyle  . Physical activity:    Days per week: Not on file    Minutes per session: Not on file  . Stress: Not on file  Relationships  . Social connections:    Talks on phone: Not on file    Gets together: Not on file    Attends religious service: Not on file    Active member of club or organization: Not on file    Attends meetings of clubs or organizations: Not on file    Relationship status: Not on file  . Intimate partner violence:    Fear of current or ex partner: Not on file    Emotionally abused: Not on file    Physically abused: Not on file    Forced sexual activity: Not on file  Other Topics Concern  . Not on file  Social History Narrative  . Not on file   The PMH, PSH, Social History, Family History, Medications, and allergies have been reviewed in [facility], and have been updated if relevant.  OBJECTIVE:  BP 118/60 (BP Location: Right Arm, Patient Position: Sitting, Cuff Size: Small)   Pulse 78   Temp 98.2 F (36.8 C) (Oral)   Resp 16   Wt 157 lb (71.2 kg)   SpO2 96%   BMI 28.72 kg/m   Appears well, in no apparent distress.  Vital signs are normal. The abdomen is soft without tenderness, guarding, mass, rebound or organomegaly. No CVA tenderness or inguinal adenopathy noted. Urine dipstick shows positive for nitrates and positive for leukocytes.   ASSESSMENT: UTI uncomplicated without evidence of pyelonephritis  PLAN: Treatment per orders - cipro 250 mg twice daily x 5 days, also push fluids, may use Pyridium OTC prn. Call or return to clinic prn if these symptoms worsen or fail to improve as anticipated.

## 2018-04-20 NOTE — Addendum Note (Signed)
Addended by: Lucille Passy on: 04/20/2018 01:54 PM   Modules accepted: Orders

## 2018-04-20 NOTE — Addendum Note (Signed)
Addended by: Jiles Prows on: 04/20/2018 09:28 AM   Modules accepted: Orders

## 2018-04-20 NOTE — Patient Instructions (Signed)
Great to meet you.  Take cipro as directed- 1 tablet twice daily for 5 days.  Try OTC monistat cream.   Urinary Tract Infection, Adult A urinary tract infection (UTI) is an infection of any part of the urinary tract. The urinary tract includes the:  Kidneys.  Ureters.  Bladder.  Urethra.  These organs make, store, and get rid of pee (urine) in the body. Follow these instructions at home:  Take over-the-counter and prescription medicines only as told by your doctor.  If you were prescribed an antibiotic medicine, take it as told by your doctor. Do not stop taking the antibiotic even if you start to feel better.  Avoid the following drinks: ? Alcohol. ? Caffeine. ? Tea. ? Carbonated drinks.  Drink enough fluid to keep your pee clear or pale yellow.  Keep all follow-up visits as told by your doctor. This is important.  Make sure to: ? Empty your bladder often and completely. Do not to hold pee for long periods of time. ? Empty your bladder before and after sex. ? Wipe from front to back after a bowel movement if you are female. Use each tissue one time when you wipe. Contact a doctor if:  You have back pain.  You have a fever.  You feel sick to your stomach (nauseous).  You throw up (vomit).  Your symptoms do not get better after 3 days.  Your symptoms go away and then come back. Get help right away if:  You have very bad back pain.  You have very bad lower belly (abdominal) pain.  You are throwing up and cannot keep down any medicines or water. This information is not intended to replace advice given to you by your health care provider. Make sure you discuss any questions you have with your health care provider. Document Released: 05/22/2008 Document Revised: 05/11/2016 Document Reviewed: 10/25/2015 Elsevier Interactive Patient Education  [employer].

## 2018-04-22 LAB — URINE CULTURE

## 2018-05-08 DIAGNOSIS — G4733 Obstructive sleep apnea (adult) (pediatric): Secondary | ICD-10-CM | POA: Diagnosis not present

## 2018-05-30 ENCOUNTER — Ambulatory Visit (INDEPENDENT_AMBULATORY_CARE_PROVIDER_SITE_OTHER): Payer: Medicare HMO | Admitting: Internal Medicine

## 2018-05-30 ENCOUNTER — Ambulatory Visit: Payer: Medicare HMO | Admitting: Acute Care

## 2018-05-30 ENCOUNTER — Encounter: Payer: Self-pay | Admitting: Acute Care

## 2018-05-30 ENCOUNTER — Encounter: Payer: Self-pay | Admitting: Internal Medicine

## 2018-05-30 ENCOUNTER — Ambulatory Visit: Payer: Medicare HMO

## 2018-05-30 ENCOUNTER — Ambulatory Visit (INDEPENDENT_AMBULATORY_CARE_PROVIDER_SITE_OTHER): Payer: Medicare HMO | Admitting: *Deleted

## 2018-05-30 VITALS — BP 126/88 | HR 100 | Temp 98.3°F | Ht 62.0 in | Wt 158.0 lb

## 2018-05-30 VITALS — BP 126/88 | HR 100 | Resp 18 | Ht 62.0 in | Wt 158.0 lb

## 2018-05-30 VITALS — BP 138/82 | HR 89 | Ht 62.0 in | Wt 158.4 lb

## 2018-05-30 DIAGNOSIS — R7302 Impaired glucose tolerance (oral): Secondary | ICD-10-CM

## 2018-05-30 DIAGNOSIS — J301 Allergic rhinitis due to pollen: Secondary | ICD-10-CM | POA: Diagnosis not present

## 2018-05-30 DIAGNOSIS — I10 Essential (primary) hypertension: Secondary | ICD-10-CM | POA: Diagnosis not present

## 2018-05-30 DIAGNOSIS — J209 Acute bronchitis, unspecified: Secondary | ICD-10-CM

## 2018-05-30 DIAGNOSIS — E785 Hyperlipidemia, unspecified: Secondary | ICD-10-CM | POA: Diagnosis not present

## 2018-05-30 DIAGNOSIS — G4733 Obstructive sleep apnea (adult) (pediatric): Secondary | ICD-10-CM

## 2018-05-30 DIAGNOSIS — Z1211 Encounter for screening for malignant neoplasm of colon: Secondary | ICD-10-CM | POA: Diagnosis not present

## 2018-05-30 DIAGNOSIS — Z0000 Encounter for general adult medical examination without abnormal findings: Secondary | ICD-10-CM

## 2018-05-30 NOTE — Progress Notes (Addendum)
Subjective:   Maria [name] is a 77 y.o. female who presents for Medicare Annual (Subsequent) preventive examination.  Review of Systems:  No ROS.  Medicare Wellness Visit. Additional risk factors are reflected in the social history.  Cardiac Risk Factors include: advanced age (>68mn, >>33women);hypertension Sleep patterns: gets up 1 times nightly to void and sleeps 6-7 hours nightly.    Home Safety/Smoke Alarms: Feels safe in home. Smoke alarms in place.  Living environment; residence and Firearm Safety: 1-story house/ trailer, no firearms. Lives alone, no needs for DME, good support system Seat Belt Safety/Bike Helmet: Wears seat belt.      Objective:     Vitals: BP 126/88   Pulse 100   Resp 18   Ht _0  (1.575 m)   Wt 158 lb (71.7 kg)   SpO2 94%   BMI 28.90 kg/m   Body mass index is 28.9 kg/m.  Advanced Directives 05/30/2018 11/16/2017 07/31/2017 05/29/2017 10/13/2016  Does Patient Have a Medical Advance Directive? Yes No Yes Yes No  Type of AParamedicof ALincoln UniversityLiving will - [place]Living will [place]West Perr[place] will -  Does patient want to make changes to medical advance directive? - - No - Patient declined - -  Copy of [place] Chart? No - copy requested - No - copy requested No - copy requested -  Would patient like information on creating a medical advance directive? - No - Patient declined - - No - patient declined information    Tobacco Social History   Tobacco Use  Smoking Status Former Smoker  . Packs/day: 1.00  . Years: 10.00  . Pack years: 10.00  . Types: Cigarettes  . Last attempt to quit: 12/18/1969  . Years since quitting: 48.4  Smokeless Tobacco Never Used     Counseling given: Not Answered  Past Medical History:  Diagnosis Date  . Anemia   . Arthritis   . Carpal tunnel syndrome   . Diverticulosis   . GERD (gastroesophageal reflux disease)   .  Headache(784.0)    chronic  . High cholesterol   . Hyperlipidemia   . Hypertension   . Migraine   . Nephritis   . OSA (obstructive sleep apnea)   . Palpitations   . Personal history of colonic adenomas 09/15/2010  . TIA (transient ischemic attack)   . Vertigo, benign paroxysmal    Past Surgical History:  Procedure Laterality Date  . BLADDER REPAIR    . BREAST BIOPSY     rt benign cyst  . CARPAL TUNNEL RELEASE     x 2  . COLONOSCOPY    . REPLACEMENT TOTAL KNEE BILATERAL    . TONSILLECTOMY    . UPPER GASTROINTESTINAL ENDOSCOPY     Family History  Problem Relation Age of Onset  . Diabetes Father   . Aneurysm Father   . Kidney disease Father   . Hypertension Father   . Heart disease Mother   . Colon polyps Mother   . Chronic bronchitis Mother   . Diabetes Brother   . Colon cancer Maternal Grandmother   . Irritable bowel syndrome Daughter   . Emphysema Maternal Grandfather        smoked a pipe   Social History   Socioeconomic History  . Marital status: Divorced    Spouse name: Not on file  . Number of children: 3  . Years of education: Not on file  . Highest  education level: Not on file  Occupational History  . Occupation: retired  Scientific laboratory technician  . Financial resource strain: Not hard at all  . Food insecurity:    Worry: Never true    Inability: Never true  . Transportation needs:    Medical: No    Non-medical: No  Tobacco Use  . Smoking status: Former Smoker    Packs/day: 1.00    Years: 10.00    Pack years: 10.00    Types: Cigarettes    Last attempt to quit: 12/18/1969    Years since quitting: 48.4  . Smokeless tobacco: Never Used  Substance and Sexual Activity  . Alcohol use: Yes    Comment: ocassional use  . Drug use: No  . Sexual activity: Not Currently  Lifestyle  . Physical activity:    Days per week: 3 days    Minutes per session: 30 min  . Stress: Rather much  Relationships  . Social connections:    Talks on phone: More than three times a  week    Gets together: More than three times a week    Attends religious service: 1 to 4 times per year    Active member of club or organization: Yes    Attends meetings of clubs or organizations: Not on file    Relationship status: Not on file  Other Topics Concern  . Not on file  Social History Narrative  . Not on file    Outpatient Encounter Medications as of 05/30/2018  Medication Sig  . albuterol (PROAIR HFA) 108 (90 Base) MCG/ACT inhaler INHALE 2 PUFFS INTO THE LUNCH EVERY 6 HOURS AS NEEDED FOR WHEEZING OR SHORTNESS OF BREATH  . amLODipine (NORVASC) 5 MG tablet TAKE 1 TABLET EVERY DAY  . aspirin 81 MG tablet Take 81 mg by mouth daily.  . Azelastine-Fluticasone (DYMISTA) 137-50 MCG/ACT SUSP Place 1 puff into the nose 2 (two) times daily.  Marland Kitchen b complex vitamins tablet Take 1 tablet by mouth daily.    Marland Kitchen CALCIUM PO Take by mouth daily.    . Cholecalciferol (VITAMIN D-3 PO) Take by mouth daily.    . ciprofloxacin (CIPRO) 250 MG tablet Take 1 tablet (250 mg total) by mouth 2 (two) times daily.  . clopidogrel (PLAVIX) 75 MG tablet TAKE 1 TABLET EVERY DAY  . fesoterodine (TOVIAZ) 4 MG TB24 tablet Take 4 mg by mouth daily.  . hydrochlorothiazide (MICROZIDE) 12.5 MG capsule TAKE 1 CAPSULE EVERY DAY  . levocetirizine (XYZAL) 5 MG tablet TAKE 1 TABLET EVERY EVENING  . magnesium oxide (MAG-OX) 400 MG tablet Take 400 mg by mouth daily.  . Mirabegron (MYRBETRIQ PO) Take by mouth.  . montelukast (SINGULAIR) 10 MG tablet TAKE 1 TABLET AT BEDTIME  . potassium chloride (K-DUR) 10 MEQ tablet TAKE 1 TABLET EVERY DAY  . simvastatin (ZOCOR) 10 MG tablet TAKE 1 TABLET EVERY DAY  AT  6  PM  . SYMBICORT 160-4.5 MCG/ACT inhaler INHALE 2 PUFFS TWICE DAILY   No facility-administered encounter medications on file as of 05/30/2018.     Activities of Daily Living In your present state of health, do you have any difficulty performing the following activities: 05/30/2018  Hearing? N  Vision? N  Difficulty  concentrating or making decisions? N  Walking or climbing stairs? N  Dressing or bathing? N  Doing errands, shopping? N  Preparing Food and eating ? N  Using the Toilet? N  In the past six months, have you accidently leaked urine? Maria [name]  Comment followed by urology  Do you have problems with loss of bowel control? N  Managing your Medications? N  Managing your Finances? N  Housekeeping or managing your Housekeeping? N  Some recent data might be hidden    Patient Care Team: Biagio Borg, MD as PCP - General (Internal Medicine) Christian Mate, MD (Orthopedic Surgery) Bjorn Loser, MD as Consulting Physician (Urology)    Assessment:   This is a routine wellness examination for Texie. Physical assessment deferred to PCP.   Exercise Activities and Dietary recommendations Current Exercise Habits: Home exercise routine, Type of exercise: walking(hiking), Time (Minutes): 40, Frequency (Times/Week): 4, Weekly Exercise (Minutes/Week): 160, Intensity: Mild, Exercise limited by: orthopedic condition(s)  Diet (meal preparation, eat out, water intake, caffeinated beverages, dairy products, fruits and vegetables): in general, a "healthy" diet  , well balanced, eats a variety of fruits and vegetables daily, limits salt, fat/cholesterol, sugar,carbohydrates,caffeine, drinks 6-8 glasses of water daily.   Goals    . Patient Stated     Decrease my stress as much as possible and sale my home as soon as possible.       Fall Risk Fall Risk  05/30/2018 05/29/2017 12/23/2016 12/16/2015 11/10/2014  Falls in the past year? No No No Yes No  Number falls in past yr: - - - 1 -  Injury with Fall? - - - No -  Comment - - - - -    Depression Screen PHQ 2/9 Scores 05/30/2018 05/29/2017 12/29/2016 12/16/2015  PHQ - 2 Score 1 0 0 0  PHQ- 9 Score 3 - - -     Cognitive Function       Ad8 score reviewed for issues:  Issues making decisions: no  Less interest in hobbies / activities: no  Repeats  questions, stories (family complaining): no  Trouble using ordinary gadgets (microwave, computer, phone):no  Forgets the month or year: no  Mismanaging finances: no  Remembering appts: no  Daily problems with thinking and/or memory: no Ad8 score is= 0  Immunization History  Administered Date(s) Administered  . Influenza Split 10/22/2012, 08/25/2013  . Influenza Whole 08/18/2010  . Influenza, High Dose Seasonal PF 12/16/2015, 12/01/2016, 10/30/2017  . Influenza,inj,Quad PF,6+ Mos 01/05/2015  . Pneumococcal Conjugate-13 10/23/2013  . Pneumococcal Polysaccharide-23 08/18/2008  . Td 11/10/2014     Screening Tests Health Maintenance  Topic Date Due  . INFLUENZA VACCINE  07/18/2018  . COLONOSCOPY  11/20/2018  . TETANUS/TDAP  11/10/2024  . DEXA SCAN  Completed  . PNA vac Low Risk Adult  Completed      Plan:    Continue doing brain stimulating activities (puzzles, reading, adult coloring books, staying active) to keep memory sharp.   Continue to eat heart healthy diet (full of fruits, vegetables, whole grains, lean protein, water--limit salt, fat, and sugar intake) and increase physical activity as tolerated.  I have personally reviewed and noted the following in the patient's chart:   . Medical and social history . Use of alcohol, tobacco or illicit drugs  . Current medications and supplements . Functional ability and status . Nutritional status . Physical activity . Advanced directives . List of other physicians . Vitals . Screenings to include cognitive, depression, and falls . Referrals and appointments  In addition, I have reviewed and discussed with patient certain preventive protocols, quality metrics, and best practice recommendations. A written personalized care plan for preventive services as well as general preventive health recommendations were provided to patient.  Michiel Cowboy, RN  05/30/2018  Medical screening examination/treatment/procedure(s)  were performed by non-physician practitioner and as supervising physician I was immediately available for consultation/collaboration. I agree with above. Cathlean Cower, MD

## 2018-05-30 NOTE — Assessment & Plan Note (Signed)
Stable interval Plan Continue Xyzal daily

## 2018-05-30 NOTE — Assessment & Plan Note (Signed)
Stable interval Plan: Continue Symbicort 2 puffs twice daily Rinse mouth after use Use albuterol as needed for shortness of breath or wheezing

## 2018-05-30 NOTE — Progress Notes (Signed)
Subjective:    Patient ID: Maria [name], female    DOB: 20-Jun-1941, 77 y.o.   MRN: [id number]  HPI  Here for yearly f/u;  Overall doing ok;  Pt denies Chest pain, worsening SOB, DOE, wheezing, orthopnea, PND, worsening LE edema, palpitations, dizziness or syncope.  Pt denies neurological change such as new headache, facial or extremity weakness.  Pt denies polydipsia, polyuria, or low sugar symptoms. Pt states overall good compliance with treatment and medications, good tolerability, and has been trying to follow appropriate diet.  Pt denies worsening depressive symptoms, suicidal ideation or panic. No fever, night sweats, wt loss, loss of appetite, or other constitutional symptoms.  Pt states good ability with ADL's, has low fall risk, home safety reviewed and adequate, no other significant changes in hearing or vision, and only occasionally active with exercise.  No new complaints Past Medical History:  Diagnosis Date  . Anemia   . Arthritis   . Carpal tunnel syndrome   . Diverticulosis   . GERD (gastroesophageal reflux disease)   . Headache(784.0)    chronic  . High cholesterol   . Hyperlipidemia   . Hypertension   . Migraine   . Nephritis   . OSA (obstructive sleep apnea)   . Palpitations   . Personal history of colonic adenomas 09/15/2010  . TIA (transient ischemic attack)   . Vertigo, benign paroxysmal    Past Surgical History:  Procedure Laterality Date  . BLADDER REPAIR    . BREAST BIOPSY     rt benign cyst  . CARPAL TUNNEL RELEASE     x 2  . COLONOSCOPY    . REPLACEMENT TOTAL KNEE BILATERAL    . TONSILLECTOMY    . UPPER GASTROINTESTINAL ENDOSCOPY      reports that she quit smoking about 48 years ago. Her smoking use included cigarettes. She has a 10.00 pack-year smoking history. She has never used smokeless tobacco. She reports that she drinks alcohol. She reports that she does not use drugs. family history includes Aneurysm in her father; Chronic bronchitis in  her mother; Colon cancer in her maternal grandmother; Colon polyps in her mother; Diabetes in her brother and father; Emphysema in her maternal grandfather; Heart disease in her mother; Hypertension in her father; Irritable bowel syndrome in her daughter; Kidney disease in her father. Allergies  Allergen Reactions  . Amoxicillin Hives  . Ace Inhibitors Other (See Comments)    unknown  . Bee Venom   . Benicar [Olmesartan Medoxomil] Other (See Comments)    Tongue swelling  . Codeine     REACTION: nausea  . Lisinopril Other (See Comments)    unknown  . Macrobid [Nitrofurantoin Monohyd Macro]     Pulmonary Fibrosis  . Nutritional Supplements     Bee stings cause angioedema-carry an EPIPEN   Current Outpatient Medications on File Prior to Visit  Medication Sig Dispense Refill  . albuterol (PROAIR HFA) 108 (90 Base) MCG/ACT inhaler INHALE 2 PUFFS INTO THE LUNCH EVERY 6 HOURS AS NEEDED FOR WHEEZING OR SHORTNESS OF BREATH 8.5 Inhaler 2  . amLODipine (NORVASC) 5 MG tablet TAKE 1 TABLET EVERY DAY 90 tablet 1  . aspirin 81 MG tablet Take 81 mg by mouth daily.    . Azelastine-Fluticasone (DYMISTA) 137-50 MCG/ACT SUSP Place 1 puff into the nose 2 (two) times daily. 6 g 5  . b complex vitamins tablet Take 1 tablet by mouth daily.      Marland Kitchen CALCIUM PO Take by mouth  daily.      . Cholecalciferol (VITAMIN D-3 PO) Take by mouth daily.      . ciprofloxacin (CIPRO) 250 MG tablet Take 1 tablet (250 mg total) by mouth 2 (two) times daily. 10 tablet 0  . clopidogrel (PLAVIX) 75 MG tablet TAKE 1 TABLET EVERY DAY 90 tablet 1  . fesoterodine (TOVIAZ) 4 MG TB24 tablet Take 4 mg by mouth daily.    . hydrochlorothiazide (MICROZIDE) 12.5 MG capsule TAKE 1 CAPSULE EVERY DAY 90 capsule 1  . levocetirizine (XYZAL) 5 MG tablet TAKE 1 TABLET EVERY EVENING 90 tablet 1  . magnesium oxide (MAG-OX) 400 MG tablet Take 400 mg by mouth daily.    . Mirabegron (MYRBETRIQ PO) Take by mouth.    . montelukast (SINGULAIR) 10 MG  tablet TAKE 1 TABLET AT BEDTIME 90 tablet 1  . potassium chloride (K-DUR) 10 MEQ tablet TAKE 1 TABLET EVERY DAY 90 tablet 1  . simvastatin (ZOCOR) 10 MG tablet TAKE 1 TABLET EVERY DAY  AT  6  PM 90 tablet 1  . SYMBICORT 160-4.5 MCG/ACT inhaler INHALE 2 PUFFS TWICE DAILY 3 Inhaler 3   No current facility-administered medications on file prior to visit.    Review of Systems Constitutional: Negative for other unusual diaphoresis, sweats, appetite or weight changes HENT: Negative for other worsening hearing loss, ear pain, facial swelling, mouth sores or neck stiffness.   Eyes: Negative for other worsening pain, redness or other visual disturbance.  Respiratory: Negative for other stridor or swelling Cardiovascular: Negative for other palpitations or other chest pain  Gastrointestinal: Negative for worsening diarrhea or loose stools, blood in stool, distention or other pain Genitourinary: Negative for hematuria, flank pain or other change in urine volume.  Musculoskeletal: Negative for myalgias or other joint swelling.  Skin: Negative for other color change, or other wound or worsening drainage.  Neurological: Negative for other syncope or numbness. Hematological: Negative for other adenopathy or swelling Psychiatric/Behavioral: Negative for hallucinations, other worsening agitation, SI, self-injury, or new decreased concentration All other system neg per pt    Objective:   Physical Exam BP 126/88   Pulse 100   Temp 98.3 F (36.8 C) (Oral)   Ht _0  (1.575 m)   Wt 158 lb (71.7 kg)   SpO2 94%   BMI 28.90 kg/m  VS noted,  Constitutional: Pt appears in NAD HENT: Head: NCAT.  Right Ear: External ear normal.  Left Ear: External ear normal.  Eyes: . Pupils are equal, round, and reactive to light. Conjunctivae and EOM are normal Nose: without d/c or deformity Neck: Neck supple. Gross normal ROM Cardiovascular: Normal rate and regular rhythm.   Pulmonary/Chest: Effort normal and breath  sounds without rales or wheezing.  Abd:  Soft, NT, ND, + BS, no organomegaly Neurological: Pt is alert. At baseline orientation, motor grossly intact Skin: Skin is warm. No rashes, other new lesions, no LE edema Psychiatric: Pt behavior is normal without agitation  No other exam findings Lab Results  Component Value Date   WBC 6.2 02/18/2018   HGB 13.9 02/18/2018   HCT 41.8 02/18/2018   PLT 306.0 02/18/2018   GLUCOSE 91 02/18/2018   CHOL 137 12/01/2016   TRIG 52.0 12/01/2016   HDL 60.30 12/01/2016   LDLDIRECT 113.2 10/23/2013   LDLCALC 66 12/01/2016   ALT 20 02/18/2018   AST 22 02/18/2018   [name] 139 02/18/2018   K 3.7 02/18/2018   CL 104 02/18/2018   CREATININE 0.66 02/18/2018  BUN 11 02/18/2018   CO2 28 02/18/2018   TSH 1.57 02/18/2018   INR 0.96 01/31/2011   HGBA1C 5.9 02/18/2018      Assessment & Plan:

## 2018-05-30 NOTE — Assessment & Plan Note (Signed)
stable overall by history and exam, recent data reviewed with pt, and pt to continue medical treatment as before,  to f/u any worsening symptoms or concerns, for f/u lab at her convenience

## 2018-05-30 NOTE — Patient Instructions (Signed)
Please continue all other medications as before, and refills have been done if requested.  Please have the pharmacy call with any other refills you may need.  Please continue your efforts at being more active, low cholesterol diet, and weight control.  Please keep your appointments with your specialists as you may have planned  Please go to the LAB in the Basement (turn left off the elevator) for the tests to be done at your convenience  You will be contacted by phone if any changes need to be made immediately.  Otherwise, you will receive a letter about your results with an explanation, but please check with MyChart first.  Please remember to sign up for MyChart if you have not done so, as this will be important to you in the future with finding out test results, communicating by private email, and scheduling acute appointments online when needed.

## 2018-05-30 NOTE — Assessment & Plan Note (Signed)
stable overall by history and exam, recent data reviewed with pt, and pt to continue medical treatment as before,  to f/u any worsening symptoms or concerns Lab Results  Component Value Date   HGBA1C 5.9 02/18/2018

## 2018-05-30 NOTE — Assessment & Plan Note (Signed)
Compliant with CPAP No further nighttime awakenings, increased focus, more rested when he awakens Plan We will place an order for CPAP supplies. We will leave pressure at Auto Set 5-15 cm pressure as you have good control od sleep apnea at this setting. Continue on CPAP at bedtime. You appear to be benefiting from the treatment Goal is to wear for at least 6 hours each night for maximal clinical benefit. Continue to work on weight loss, as the link between excess weight  and sleep apnea is well established.  Do not drive if sleepy. Continue using the Biotene for mouth dryness. Remember to clean mask, tubing, filter, and reservoir once weekly with soapy water.  Follow up with Dr. Halford Chessman or Judson Roch NP In 12 months  or before as needed.

## 2018-05-30 NOTE — Patient Instructions (Signed)
Continue doing brain stimulating activities (puzzles, reading, adult coloring books, staying active) to keep memory sharp.   Continue to eat heart healthy diet (full of fruits, vegetables, whole grains, lean protein, water--limit salt, fat, and sugar intake) and increase physical activity as tolerated.   Maria [name] , Thank you for taking time to come for your Medicare Wellness Visit. I appreciate your ongoing commitment to your health goals. Please review the following plan we discussed and let me know if I can assist you in the future.   These are the goals we discussed: Goals    . Patient Stated     Decrease my stress as much as possible and sale my home as soon as possible.       This is a list of the screening recommended for you and due dates:  Health Maintenance  Topic Date Due  . Flu Shot  07/18/2018  . Colon Cancer Screening  11/20/2018  . Tetanus Vaccine  11/10/2024  . DEXA scan (bone density measurement)  Completed  . Pneumonia vaccines  Completed   Health Maintenance, Female Adopting a healthy lifestyle and getting preventive care can go a long way to promote health and wellness. Talk with your health care provider about what schedule of regular examinations is right for you. This is a good chance for you to check in with your provider about disease prevention and staying healthy. In between checkups, there are plenty of things you can do on your own. Experts have done a lot of research about which lifestyle changes and preventive measures are most likely to keep you healthy. Ask your health care provider for more information. Weight and diet Eat a healthy diet  Be sure to include plenty of vegetables, fruits, low-fat dairy products, and lean protein.  Do not eat a lot of foods high in solid fats, added sugars, or salt.  Get regular exercise. This is one of the most important things you can do for your health. ? Most adults should exercise for at least 150 minutes each  week. The exercise should increase your heart rate and make you sweat (moderate-intensity exercise). ? Most adults should also do strengthening exercises at least twice a week. This is in addition to the moderate-intensity exercise.  Maintain a healthy weight  Body mass index (BMI) is a measurement that can be used to identify possible weight problems. It estimates body fat based on height and weight. Your health care provider can help determine your BMI and help you achieve or maintain a healthy weight.  For females 106 years of age and older: ? A BMI below 18.5 is considered underweight. ? A BMI of 18.5 to 24.9 is normal. ? A BMI of 25 to 29.9 is considered overweight. ? A BMI of 30 and above is considered obese.  Watch levels of cholesterol and blood lipids  You should start having your blood tested for lipids and cholesterol at 78 years of age, then have this test every 5 years.  You may need to have your cholesterol levels checked more often if: ? Your lipid or cholesterol levels are high. ? You are older than 78 years of age. ? You are at high risk for heart disease.  Cancer screening Lung Cancer  Lung cancer screening is recommended for adults 22-29 years old who are at high risk for lung cancer because of a history of smoking.  A yearly low-dose CT scan of the lungs is recommended for people who: ? Currently smoke. ?  Have quit within the past 15 years. ? Have at least a 30-pack-year history of smoking. A pack year is smoking an average of one pack of cigarettes a day for 1 year.  Yearly screening should continue until it has been 15 years since you quit.  Yearly screening should stop if you develop a health problem that would prevent you from having lung cancer treatment.  Breast Cancer  Practice breast self-awareness. This means understanding how your breasts normally appear and feel.  It also means doing regular breast self-exams. Let your health care provider know  about any changes, no matter how small.  If you are in your 20s or 30s, you should have a clinical breast exam (CBE) by a health care provider every 1-3 years as part of a regular health exam.  If you are 13 or older, have a CBE every year. Also consider having a breast X-ray (mammogram) every year.  If you have a family history of breast cancer, talk to your health care provider about genetic screening.  If you are at high risk for breast cancer, talk to your health care provider about having an MRI and a mammogram every year.  Breast cancer gene (BRCA) assessment is recommended for women who have family members with BRCA-related cancers. BRCA-related cancers include: ? Breast. ? Ovarian. ? Tubal. ? Peritoneal cancers.  Results of the assessment will determine the need for genetic counseling and BRCA1 and BRCA2 testing.  Cervical Cancer Your health care provider may recommend that you be screened regularly for cancer of the pelvic organs (ovaries, uterus, and vagina). This screening involves a pelvic examination, including checking for microscopic changes to the surface of your cervix (Pap test). You may be encouraged to have this screening done every 3 years, beginning at age 51.  For women ages 59-65, health care providers may recommend pelvic exams and Pap testing every 3 years, or they may recommend the Pap and pelvic exam, combined with testing for human papilloma virus (HPV), every 5 years. Some types of HPV increase your risk of cervical cancer. Testing for HPV may also be done on women of any age with unclear Pap test results.  Other health care providers may not recommend any screening for nonpregnant women who are considered low risk for pelvic cancer and who do not have symptoms. Ask your health care provider if a screening pelvic exam is right for you.  If you have had past treatment for cervical cancer or a condition that could lead to cancer, you need Pap tests and screening  for cancer for at least 20 years after your treatment. If Pap tests have been discontinued, your risk factors (such as having a new sexual partner) need to be reassessed to determine if screening should resume. Some women have medical problems that increase the chance of getting cervical cancer. In these cases, your health care provider may recommend more frequent screening and Pap tests.  Colorectal Cancer  This type of cancer can be detected and often prevented.  Routine colorectal cancer screening usually begins at 78 years of age and continues through 78 years of age.  Your health care provider may recommend screening at an earlier age if you have risk factors for colon cancer.  Your health care provider may also recommend using home test kits to check for hidden blood in the stool.  A small camera at the end of a tube can be used to examine your colon directly (sigmoidoscopy or colonoscopy). This is done  to check for the earliest forms of colorectal cancer.  Routine screening usually begins at age 25.  Direct examination of the colon should be repeated every 5-10 years through 78 years of age. However, you may need to be screened more often if early forms of precancerous polyps or small growths are found.  Skin Cancer  Check your skin from head to toe regularly.  Tell your health care provider about any new moles or changes in moles, especially if there is a change in a mole's shape or color.  Also tell your health care provider if you have a mole that is larger than the size of a pencil eraser.  Always use sunscreen. Apply sunscreen liberally and repeatedly throughout the day.  Protect yourself by wearing long sleeves, pants, a wide-brimmed hat, and sunglasses whenever you are outside.  Heart disease, diabetes, and high blood pressure  High blood pressure causes heart disease and increases the risk of stroke. High blood pressure is more likely to develop in: ? People who have  blood pressure in the high end of the normal range (130-139/85-89 mm Hg). ? People who are overweight or obese. ? People who are African American.  If you are 27-39 years of age, have your blood pressure checked every 3-5 years. If you are 102 years of age or older, have your blood pressure checked every year. You should have your blood pressure measured twice-once when you are at a hospital or clinic, and once when you are not at a hospital or clinic. Record the average of the two measurements. To check your blood pressure when you are not at a hospital or clinic, you can use: ? An automated blood pressure machine at a pharmacy. ? A home blood pressure monitor.  If you are between 38 years and 54 years old, ask your health care provider if you should take aspirin to prevent strokes.  Have regular diabetes screenings. This involves taking a blood sample to check your fasting blood sugar level. ? If you are at a normal weight and have a low risk for diabetes, have this test once every three years after 78 years of age. ? If you are overweight and have a high risk for diabetes, consider being tested at a younger age or more often. Preventing infection Hepatitis B  If you have a higher risk for hepatitis B, you should be screened for this virus. You are considered at high risk for hepatitis B if: ? You were born in a country where hepatitis B is common. Ask your health care provider which countries are considered high risk. ? Your parents were born in a high-risk country, and you have not been immunized against hepatitis B (hepatitis B vaccine). ? You have HIV or AIDS. ? You use needles to inject street drugs. ? You live with someone who has hepatitis B. ? You have had sex with someone who has hepatitis B. ? You get hemodialysis treatment. ? You take certain medicines for conditions, including cancer, organ transplantation, and autoimmune conditions.  Hepatitis C  Blood testing is recommended  for: ? Everyone born from [birth year] through 1966. ? Anyone with known risk factors for hepatitis C.  Sexually transmitted infections (STIs)  You should be screened for sexually transmitted infections (STIs) including gonorrhea and chlamydia if: ? You are sexually active and are younger than 78 years of age. ? You are older than 78 years of age and your health care provider tells you that you are at  risk for this type of infection. ? Your sexual activity has changed since you were last screened and you are at an increased risk for chlamydia or gonorrhea. Ask your health care provider if you are at risk.  If you do not have HIV, but are at risk, it may be recommended that you take a prescription medicine daily to prevent HIV infection. This is called pre-exposure prophylaxis (PrEP). You are considered at risk if: ? You are sexually active and do not regularly use condoms or know the HIV status of your partner(s). ? You take drugs by injection. ? You are sexually active with a partner who has HIV.  Talk with your health care provider about whether you are at high risk of being infected with HIV. If you choose to begin PrEP, you should first be tested for HIV. You should then be tested every 3 months for as long as you are taking PrEP. Pregnancy  If you are premenopausal and you may become pregnant, ask your health care provider about preconception counseling.  If you may become pregnant, take 400 to 800 micrograms (mcg) of folic acid every day.  If you want to prevent pregnancy, talk to your health care provider about birth control (contraception). Osteoporosis and menopause  Osteoporosis is a disease in which the bones lose minerals and strength with aging. This can result in serious bone fractures. Your risk for osteoporosis can be identified using a bone density scan.  If you are 84 years of age or older, or if you are at risk for osteoporosis and fractures, ask your health care provider if  you should be screened.  Ask your health care provider whether you should take a calcium or vitamin D supplement to lower your risk for osteoporosis.  Menopause may have certain physical symptoms and risks.  Hormone replacement therapy may reduce some of these symptoms and risks. Talk to your health care provider about whether hormone replacement therapy is right for you. Follow these instructions at home:  Schedule regular health, dental, and eye exams.  Stay current with your immunizations.  Do not use any tobacco products including cigarettes, chewing tobacco, or electronic cigarettes.  If you are pregnant, do not drink alcohol.  If you are breastfeeding, limit how much and how often you drink alcohol.  Limit alcohol intake to no more than 1 drink per day for nonpregnant women. One drink equals 12 ounces of beer, 5 ounces of Jatziry Wechter, or 1 ounces of hard liquor.  Do not use street drugs.  Do not share needles.  Ask your health care provider for help if you need support or information about quitting drugs.  Tell your health care provider if you often feel depressed.  Tell your health care provider if you have ever been abused or do not feel safe at home. This information is not intended to replace advice given to you by your health care provider. Make sure you discuss any questions you have with your health care provider. Document Released: 06/19/2011 Document Revised: 05/11/2016 Document Reviewed: 09/07/2015 Elsevier Interactive Patient Education  [employer].

## 2018-05-30 NOTE — Progress Notes (Addendum)
History of Present Illness Maria [name] is a 77 y.o. female with OSA on CPAP and chronic bronchitis. She is followed by Dr. Halford [name]   05/30/2018  Pt. Presents for follow up. She is here for down Load to check and see if her pressures have decreased with better mask fit, and  less leak. CPAP titration shows optimal pressure is 7 cm H2O, but  as she is well controlled on Auto Set 5-15 ( AHI is 1.2), and required pressured in the 12-15 range per down loads I have been hesitant to decrease pressures.She states he has been doing well on her CPAP.  She endorses good compliance , which is confirmed by download.  She states she feels less of a mask leak.She has no complaints about pressure at present.She states the higher pressures are not bothering her with the improved mask fit.She is sleeping well. She states she has less fatigue with the improved sleep.She states she has no issues with equipment or pressure settings.  No current issues with bronchitis.  She is compliant with her Symbicort 2 puffs twice daily and rarely needs to use her rescue inhaler.  She is compliant with her Xyzal and Singulair for allergy control.  She denies fever, chest pain, orthopnea, or hemoptysis.  CPAP download 04/30/2018 through 05/29/2018 Air sense 10 AutoSet 5 cm H2O to 15 cm H2O Usage days 30 of 30 or 100% Greater than 4 hours 30 days or 100% Less than 4 hours 0 days or 0% Average usage 7 hours 1 minute per night Medium pressure 12.5 cm H2O Maximum pressure 14.8 cm H2O AHI equals 1.3  Discussed with patient increasing AutoSet to max of 18 cm H2O but she did not want to do this at this time as she felt AutoSet 5 to 15 cm H2O is working well for her in controlling her apnea.  I asked her to let us know if she wants to increase pressure at any time she can call the office.     Test Results: - Sleep Study 07/2017>> mild OSA with AHI 6/hr. , mild o2 desat 85%. MPLM .   CBC Latest Ref Rng & Units 02/18/2018 12/01/2016  04/27/2015  WBC 4.0 - 10.5 K/uL 6.2 7.3 8.6  Hemoglobin 12.0 - 15.0 g/dL 13.9 14.2 13.9  Hematocrit 36.0 - 46.0 % 41.8 42.5 41.0  Platelets 150.0 - 400.0 K/uL 306.0 268.0 308.0    BMP Latest Ref Rng & Units 02/18/2018 12/01/2016 11/10/2014  Glucose 70 - 99 mg/dL 91 104(H) 91  BUN 6 - 23 mg/dL _0 Creatinine 0.40 - 1.20 mg/dL 0.66 0.71 0.7  Sodium 135 - 145 mEq/L 139 140 138  Potassium 3.5 - 5.1 mEq/L 3.7 3.4(L) 3.6  Chloride 96 - 112 mEq/L 104 102 101  CO2 19 - 32 mEq/L _1 Calcium 8.4 - 10.5 mg/dL 9.6 9.7 9.5    BNP No results found for: BNP  ProBNP No results found for: PROBNP  PFT    Component Value Date/Time   FEV1PRE 1.42 04/17/2017 1058   FEV1POST 1.45 04/17/2017 1058   FVCPRE 1.63 04/17/2017 1058   FVCPOST 1.69 04/17/2017 1058   TLC 3.72 04/17/2017 1058   DLCOUNC 14.90 04/17/2017 1058   PREFEV1FVCRT 87 04/17/2017 1058   PSTFEV1FVCRT 86 04/17/2017 1058    No results found.   Past medical hx Past Medical History:  Diagnosis Date  . Anemia   . Arthritis   . Carpal tunnel syndrome   .  Diverticulosis   . GERD (gastroesophageal reflux disease)   . Headache(784.0)    chronic  . High cholesterol   . Hyperlipidemia   . Hypertension   . Migraine   . Nephritis   . OSA (obstructive sleep apnea)   . Palpitations   . Personal history of colonic adenomas 09/15/2010  . TIA (transient ischemic attack)   . Vertigo, benign paroxysmal      Social History   Tobacco Use  . Smoking status: Former Smoker    Packs/day: 1.00    Years: 10.00    Pack years: 10.00    Types: Cigarettes    Last attempt to quit: 12/18/1969    Years since quitting: 48.4  . Smokeless tobacco: Never Used  Substance Use Topics  . Alcohol use: Yes    Comment: ocassional use  . Drug use: No    Ms.Maria [name] reports that she quit smoking about 48 years ago. Her smoking use included cigarettes. She has a 10.00 pack-year smoking history. She has never used smokeless tobacco. She reports  that she drinks alcohol. She reports that she does not use drugs.  Tobacco Cessation: Former smoker quit 1971 with a 10-pack-year smoking history Past surgical hx, Family hx, Social hx all reviewed.  Current Outpatient Medications on File Prior to Visit  Medication Sig  . albuterol (PROAIR HFA) 108 (90 Base) MCG/ACT inhaler INHALE 2 PUFFS INTO THE LUNCH EVERY 6 HOURS AS NEEDED FOR WHEEZING OR SHORTNESS OF BREATH  . amLODipine (NORVASC) 5 MG tablet TAKE 1 TABLET EVERY DAY  . aspirin 81 MG tablet Take 81 mg by mouth daily.  . Azelastine-Fluticasone (DYMISTA) 137-50 MCG/ACT SUSP Place 1 puff into the nose 2 (two) times daily.  Marland Kitchen b complex vitamins tablet Take 1 tablet by mouth daily.    Marland Kitchen CALCIUM PO Take by mouth daily.    . Cholecalciferol (VITAMIN D-3 PO) Take by mouth daily.    . ciprofloxacin (CIPRO) 250 MG tablet Take 1 tablet (250 mg total) by mouth 2 (two) times daily.  . clopidogrel (PLAVIX) 75 MG tablet TAKE 1 TABLET EVERY DAY  . fesoterodine (TOVIAZ) 4 MG TB24 tablet Take 4 mg by mouth daily.  . hydrochlorothiazide (MICROZIDE) 12.5 MG capsule TAKE 1 CAPSULE EVERY DAY  . levocetirizine (XYZAL) 5 MG tablet TAKE 1 TABLET EVERY EVENING  . magnesium oxide (MAG-OX) 400 MG tablet Take 400 mg by mouth daily.  . Mirabegron (MYRBETRIQ PO) Take by mouth.  . montelukast (SINGULAIR) 10 MG tablet TAKE 1 TABLET AT BEDTIME  . potassium chloride (K-DUR) 10 MEQ tablet TAKE 1 TABLET EVERY DAY  . simvastatin (ZOCOR) 10 MG tablet TAKE 1 TABLET EVERY DAY  AT  6  PM  . SYMBICORT 160-4.5 MCG/ACT inhaler INHALE 2 PUFFS TWICE DAILY   No current facility-administered medications on file prior to visit.      Allergies  Allergen Reactions  . Amoxicillin Hives  . Ace Inhibitors Other (See Comments)    unknown  . Bee Venom   . Benicar [Olmesartan Medoxomil] Other (See Comments)    Tongue swelling  . Codeine     REACTION: nausea  . Lisinopril Other (See Comments)    unknown  . Macrobid  [Nitrofurantoin Monohyd Macro]     Pulmonary Fibrosis  . Nutritional Supplements     Bee stings cause angioedema-carry an EPIPEN    Review Of Systems:  Constitutional:   No  weight loss, night sweats,  Fevers, chills, fatigue, or  lassitude.  HEENT:  No headaches,  Difficulty swallowing,  Tooth/dental problems, or  Sore throat,                No sneezing, itching, ear ache, nasal congestion, post nasal drip,   CV:  No chest pain,  Orthopnea, PND, swelling in lower extremities, anasarca, dizziness, palpitations, syncope.   GI  No heartburn, indigestion, abdominal pain, nausea, vomiting, diarrhea, change in bowel habits, loss of appetite, bloody stools.   Resp: No shortness of breath with exertion or at rest.  No excess mucus, no productive cough,  No non-productive cough,  No coughing up of blood.  No change in color of mucus.  No wheezing.  No chest wall deformity  Skin: no rash or lesions.  GU: no dysuria, change in color of urine, no urgency or frequency.  No flank pain, no hematuria   MS:  No joint pain or swelling.  No decreased range of motion.  No back pain.  Psych:  No change in mood or affect. No depression or anxiety.  No memory loss.   Vital Signs BP 138/82 (BP Location: Left Arm, Cuff Size: Normal)   Pulse 89   Ht 5' 2" (1.575 m)   Wt 158 lb 6.4 oz (71.8 kg)   SpO2 94%   BMI 28.97 kg/m    Physical Exam:  General- No distress,  A&Ox3, pleasant and appropriate ENT: No sinus tenderness, TM clear, pale nasal mucosa, no oral exudate,no post nasal drip, no LAN Cardiac: S1, S2, regular rate and rhythm, no murmur Chest: No wheeze/ rales/ dullness; no accessory muscle use, no nasal flaring, no sternal retractions Abd.: Soft Non-tender, nondistended, nontender, bowel sounds positive, Body mass index is 28.97 kg/m. Ext: No clubbing cyanosis, edema Neuro:  normal strength, moving all extremities x4, alert and oriented x3, Skin: No rashes, warm and dry Psych: normal  mood and behavior   Assessment/Plan  Obstructive sleep apnea Compliant with CPAP No further nighttime awakenings, increased focus, more rested when he awakens Plan We will place an order for CPAP supplies. We will leave pressure at Auto Set 5-15 cm pressure as you have good control od sleep apnea at this setting. Continue on CPAP at bedtime. You appear to be benefiting from the treatment Goal is to wear for at least 6 hours each night for maximal clinical benefit. Continue to work on weight loss, as the link between excess weight  and sleep apnea is well established.  Do not drive if sleepy. Continue using the Biotene for mouth dryness. Remember to clean mask, tubing, filter, and reservoir once weekly with soapy water.  Follow up with Dr. Halford [name] or Judson Roch NP In 12 months  or before as needed.     Allergic rhinitis Stable interval Plan Continue Xyzal daily  Acute bronchitis Stable interval Plan: Continue Symbicort 2 puffs twice daily Rinse mouth after use Use albuterol as needed for shortness of breath or wheezing    Magdalen Spatz, NP 05/30/2018  1:36 PM

## 2018-05-30 NOTE — Assessment & Plan Note (Signed)
stable overall by history and exam, recent data reviewed with pt, and pt to continue medical treatment as before,  to f/u any worsening symptoms or concerns BP Readings from Last 3 Encounters:  05/30/18 126/88  05/30/18 138/82  04/20/18 118/60

## 2018-05-30 NOTE — Patient Instructions (Addendum)
It is good to see you today. We will place an order for CPAP supplies. We will leave pressure at Auto Set 5-15 cm pressure as you have good control od sleep apnea at this setting. Continue on CPAP at bedtime. You appear to be benefiting from the treatment Goal is to wear for at least 6 hours each night for maximal clinical benefit. Continue to work on weight loss, as the link between excess weight  and sleep apnea is well established.  Do not drive if sleepy. Continue using the Biotene for mouth dryness. Remember to clean mask, tubing, filter, and reservoir once weekly with soapy water.  Follow up with Dr. Halford Chessman or Judson Roch NP In 12 months  or before as needed.

## 2018-06-08 DIAGNOSIS — G4733 Obstructive sleep apnea (adult) (pediatric): Secondary | ICD-10-CM | POA: Diagnosis not present

## 2018-06-24 DIAGNOSIS — G4733 Obstructive sleep apnea (adult) (pediatric): Secondary | ICD-10-CM | POA: Diagnosis not present

## 2018-07-08 DIAGNOSIS — G4733 Obstructive sleep apnea (adult) (pediatric): Secondary | ICD-10-CM | POA: Diagnosis not present

## 2018-07-10 ENCOUNTER — Other Ambulatory Visit: Payer: Self-pay | Admitting: Internal Medicine

## 2018-07-10 DIAGNOSIS — N3946 Mixed incontinence: Secondary | ICD-10-CM | POA: Diagnosis not present

## 2018-07-10 DIAGNOSIS — R350 Frequency of micturition: Secondary | ICD-10-CM | POA: Diagnosis not present

## 2018-08-08 DIAGNOSIS — G4733 Obstructive sleep apnea (adult) (pediatric): Secondary | ICD-10-CM | POA: Diagnosis not present

## 2018-08-09 ENCOUNTER — Other Ambulatory Visit: Payer: Medicare HMO

## 2018-08-09 ENCOUNTER — Encounter: Payer: Self-pay | Admitting: Internal Medicine

## 2018-08-09 ENCOUNTER — Ambulatory Visit (INDEPENDENT_AMBULATORY_CARE_PROVIDER_SITE_OTHER): Payer: Medicare HMO | Admitting: Internal Medicine

## 2018-08-09 VITALS — BP 114/60 | HR 62 | Temp 98.2°F | Ht 62.0 in | Wt 159.0 lb

## 2018-08-09 DIAGNOSIS — N761 Subacute and chronic vaginitis: Secondary | ICD-10-CM

## 2018-08-09 DIAGNOSIS — R300 Dysuria: Secondary | ICD-10-CM | POA: Diagnosis not present

## 2018-08-09 DIAGNOSIS — R7302 Impaired glucose tolerance (oral): Secondary | ICD-10-CM

## 2018-08-09 DIAGNOSIS — R21 Rash and other nonspecific skin eruption: Secondary | ICD-10-CM

## 2018-08-09 DIAGNOSIS — N760 Acute vaginitis: Secondary | ICD-10-CM | POA: Insufficient documentation

## 2018-08-09 LAB — POC URINALSYSI DIPSTICK (AUTOMATED)
BILIRUBIN UA: NEGATIVE
Blood, UA: 1
GLUCOSE UA: NEGATIVE
Ketones, UA: NEGATIVE
Protein, UA: POSITIVE — AB
Spec Grav, UA: 1.015 (ref 1.010–1.025)
Urobilinogen, UA: 0.2 E.U./dL
pH, UA: 7 (ref 5.0–8.0)

## 2018-08-09 MED ORDER — CIPROFLOXACIN HCL 500 MG PO TABS: 500.0000 mg | ORAL_TABLET | Freq: Two times a day (BID) | ORAL | 0 refills | Status: AC

## 2018-08-09 MED ORDER — TERCONAZOLE 80 MG VA SUPP: 80.0000 mg | Freq: Every day | VAGINAL | 2 refills | Status: DC

## 2018-08-09 NOTE — Progress Notes (Addendum)
Subjective:    Patient ID: Maria [name], female    DOB: 1941/04/27, 77 y.o.   MRN: [id number]  HPI  Here with 3 days onset dysuria, freq, urgency and worsening incontinence over baseline, but Denies urinary symptoms such as flank pain, hematuria or n/v, fever, chills.  Insurance does not pay for certain oab meds such as myrbetrix and toviaz, oxybutnin not helping, So more incontinence recently, had recent miconazole for vaginitis better, but now worsening again as well.  Also incidentally has a kind of allergic rash to the RUQ only occurs with touching the skin, and better with topical benadryl.  No lip or tongue swelling, and Pt denies chest pain, increased sob or doe, wheezing, orthopnea, PND, increased LE swelling, palpitations, dizziness or syncope.   Past Medical History:  Diagnosis Date  . Anemia   . Arthritis   . Carpal tunnel syndrome   . Diverticulosis   . GERD (gastroesophageal reflux disease)   . Headache(784.0)    chronic  . High cholesterol   . Hyperlipidemia   . Hypertension   . Migraine   . Nephritis   . OSA (obstructive sleep apnea)   . Palpitations   . Personal history of colonic adenomas 09/15/2010  . TIA (transient ischemic attack)   . Vertigo, benign paroxysmal    Past Surgical History:  Procedure Laterality Date  . BLADDER REPAIR    . BREAST BIOPSY     rt benign cyst  . CARPAL TUNNEL RELEASE     x 2  . COLONOSCOPY    . REPLACEMENT TOTAL KNEE BILATERAL    . TONSILLECTOMY    . UPPER GASTROINTESTINAL ENDOSCOPY      reports that she quit smoking about 48 years ago. Her smoking use included cigarettes. She has a 10.00 pack-year smoking history. She has never used smokeless tobacco. She reports that she drinks alcohol. She reports that she does not use drugs. family history includes Aneurysm in her father; Chronic bronchitis in her mother; Colon cancer in her maternal grandmother; Colon polyps in her mother; Diabetes in her brother and father; Emphysema in  her maternal grandfather; Heart disease in her mother; Hypertension in her father; Irritable bowel syndrome in her daughter; Kidney disease in her father. Allergies  Allergen Reactions  . Amoxicillin Hives  . Ace Inhibitors Other (See Comments)    unknown  . Bee Venom   . Benicar [Olmesartan Medoxomil] Other (See Comments)    Tongue swelling  . Codeine     REACTION: nausea  . Lisinopril Other (See Comments)    unknown  . Macrobid [Nitrofurantoin Monohyd Macro]     Pulmonary Fibrosis  . Nutritional Supplements     Bee stings cause angioedema-carry an EPIPEN   Current Outpatient Medications on File Prior to Visit  Medication Sig Dispense Refill  . albuterol (PROAIR HFA) 108 (90 Base) MCG/ACT inhaler INHALE 2 PUFFS INTO THE LUNCH EVERY 6 HOURS AS NEEDED FOR WHEEZING OR SHORTNESS OF BREATH 8.5 Inhaler 2  . amLODipine (NORVASC) 5 MG tablet TAKE 1 TABLET EVERY DAY 90 tablet 1  . aspirin 81 MG tablet Take 81 mg by mouth daily.    . Azelastine-Fluticasone (DYMISTA) 137-50 MCG/ACT SUSP Place 1 puff into the nose 2 (two) times daily. 6 g 5  . b complex vitamins tablet Take 1 tablet by mouth daily.      Marland Kitchen CALCIUM PO Take by mouth daily.      . Cholecalciferol (VITAMIN D-3 PO) Take by mouth daily.      Marland Kitchen  clopidogrel (PLAVIX) 75 MG tablet TAKE 1 TABLET EVERY DAY 90 tablet 1  . hydrochlorothiazide (MICROZIDE) 12.5 MG capsule TAKE 1 CAPSULE EVERY DAY 90 capsule 1  . levocetirizine (XYZAL) 5 MG tablet TAKE 1 TABLET EVERY EVENING 90 tablet 1  . magnesium oxide (MAG-OX) 400 MG tablet Take 400 mg by mouth daily.    . montelukast (SINGULAIR) 10 MG tablet TAKE 1 TABLET AT BEDTIME 90 tablet 1  . oxybutynin (DITROPAN-XL) 10 MG 24 hr tablet TAKE 1 TABLET BY MOUTH Q DAILY  11  . potassium chloride (K-DUR) 10 MEQ tablet TAKE 1 TABLET EVERY DAY 90 tablet 1  . simvastatin (ZOCOR) 10 MG tablet TAKE 1 TABLET EVERY DAY  AT  6  PM 90 tablet 1  . SYMBICORT 160-4.5 MCG/ACT inhaler INHALE 2 PUFFS TWICE DAILY 3  Inhaler 3   No current facility-administered medications on file prior to visit.    Review of Systems  Constitutional: Negative for other unusual diaphoresis or sweats HENT: Negative for ear discharge or swelling Eyes: Negative for other worsening visual disturbances Respiratory: Negative for stridor or other swelling  Gastrointestinal: Negative for worsening distension or other blood Genitourinary: Negative for retention or other urinary change Musculoskeletal: Negative for other MSK pain or swelling Skin: Negative for color change or other new lesions Neurological: Negative for worsening tremors and other numbness  Psychiatric/Behavioral: Negative for worsening agitation or other fatigue All other system neg per pt    Objective:   Physical Exam BP 114/60 (BP Location: Left Arm, Patient Position: Sitting, Cuff Size: Normal)   Pulse 62   Temp 98.2 F (36.8 C) (Oral)   Ht _0  (1.575 m)   Wt 159 lb (72.1 kg)   SpO2 94%   BMI 29.08 kg/m  VS noted,  Constitutional: Pt appears in NAD HENT: Head: NCAT.  Right Ear: External ear normal.  Left Ear: External ear normal.  Eyes: . Pupils are equal, round, and reactive to light. Conjunctivae and EOM are normal Nose: without d/c or deformity Neck: Neck supple. Gross normal ROM Cardiovascular: Normal rate and regular rhythm.   Pulmonary/Chest: Effort normal and breath sounds without rales or wheezing.  Abd:  Soft, ND, + BS, no organomegaly, tender low mid abd without guarding or rebound Neurological: Pt is alert. At baseline orientation, motor grossly intact Skin: Skin is warm. No rashes, other new lesions, no LE edema Psychiatric: Pt behavior is normal without agitation  No other exam findings POCT Urinalysis Dipstick (Automated)  Order: [id number]  Status:  Final result Visible to patient:  No (Not Released) Dx:  Dysuria   Ref Range & Units 1d ago 5moago  Color, UA  amber  yellow   Clarity, UA  cloudy  cloudy   Glucose, UA  Negative Negative  neg R  Bilirubin, UA  negative  neg   Ketones, UA  negative  neg   Spec Grav, UA 1.010 - 1.025 1.015  1.020   Blood, UA  1  trace   pH, UA 5.0 - 8.0 7.0  6.0   Protein, UA Negative PositiveAbnormal   trace R  Urobilinogen, UA 0.2 or 1.0 E.U./dL 0.2  negativeAbnormal    Nitrite, UA  possible  positive   Leukocytes, UA Negative Large (3+)Abnormal              Assessment & Plan:

## 2018-08-09 NOTE — Assessment & Plan Note (Signed)
None at this time, ok to continue the topical benadryl prn

## 2018-08-09 NOTE — Assessment & Plan Note (Signed)
Recurring, and likely to be worse again with antibiotic, for terconazole asd,  to f/u any worsening symptoms or concerns

## 2018-08-09 NOTE — Assessment & Plan Note (Signed)
High suspicion for UTI - for urine cx, also cipro asd,  to f/u any worsening symptoms or concerns

## 2018-08-09 NOTE — Assessment & Plan Note (Signed)
Lab Results  Component Value Date   HGBA1C 5.9 02/18/2018  stable overall by history and exam, recent data reviewed with pt, and pt to continue medical treatment as before,  to f/u any worsening symptoms or concerns

## 2018-08-09 NOTE — Patient Instructions (Signed)
Please take all new medication as prescribed - the antibiotic, and the terconazole suppository as needed  Your specimen will be sent for culture  You can continue the OTC topical benadryl cream as needed  Please continue all other medications as before, and refills have been done if requested.  Please have the pharmacy call with any other refills you may need.  Please keep your appointments with your specialists as you may have planned

## 2018-08-09 NOTE — Addendum Note (Signed)
Addended by: Marcina Millard on: 08/09/2018 04:43 PM   Modules accepted: Orders

## 2018-08-11 LAB — URINE CULTURE
MICRO NUMBER:: 91009540
SPECIMEN QUALITY:: ADEQUATE

## 2018-09-08 DIAGNOSIS — G4733 Obstructive sleep apnea (adult) (pediatric): Secondary | ICD-10-CM | POA: Diagnosis not present

## 2018-10-03 DIAGNOSIS — G4733 Obstructive sleep apnea (adult) (pediatric): Secondary | ICD-10-CM | POA: Diagnosis not present

## 2018-10-08 DIAGNOSIS — G4733 Obstructive sleep apnea (adult) (pediatric): Secondary | ICD-10-CM | POA: Diagnosis not present

## 2018-10-21 DIAGNOSIS — G4733 Obstructive sleep apnea (adult) (pediatric): Secondary | ICD-10-CM | POA: Diagnosis not present

## 2018-10-29 DIAGNOSIS — Z23 Encounter for immunization: Secondary | ICD-10-CM | POA: Diagnosis not present

## 2018-11-08 DIAGNOSIS — N3946 Mixed incontinence: Secondary | ICD-10-CM | POA: Diagnosis not present

## 2018-11-08 DIAGNOSIS — R350 Frequency of micturition: Secondary | ICD-10-CM | POA: Diagnosis not present

## 2018-11-08 DIAGNOSIS — G4733 Obstructive sleep apnea (adult) (pediatric): Secondary | ICD-10-CM | POA: Diagnosis not present

## 2018-12-05 ENCOUNTER — Other Ambulatory Visit: Payer: Self-pay | Admitting: Internal Medicine

## 2018-12-08 DIAGNOSIS — G4733 Obstructive sleep apnea (adult) (pediatric): Secondary | ICD-10-CM | POA: Diagnosis not present

## 2018-12-24 ENCOUNTER — Other Ambulatory Visit (INDEPENDENT_AMBULATORY_CARE_PROVIDER_SITE_OTHER): Payer: Medicare HMO

## 2018-12-24 ENCOUNTER — Telehealth: Payer: Self-pay

## 2018-12-24 DIAGNOSIS — R399 Unspecified symptoms and signs involving the genitourinary system: Secondary | ICD-10-CM | POA: Diagnosis not present

## 2018-12-24 NOTE — Telephone Encounter (Addendum)
Relation to pt: self  Call back number: [phone]   Reason for call:  Patient seeking urine orders due to experiencing frequent urination, cloudy and abdominal pain. No available appointments today, patient requesting urine orders only, please advise

## 2018-12-24 NOTE — Addendum Note (Signed)
Addended by: Juliet Rude on: 12/24/2018 04:30 PM   Modules accepted: Orders

## 2018-12-24 NOTE — Telephone Encounter (Signed)
Returned pt's call, LVM    Copied from [place] [id number] [phone]. Topic: General - Other >> Dec 23, 2018  4:55 PM Ivar Drape wrote: Reason for CRM:   Patient would like to talk to Dr. Gwynn Burly nurse concerning a UTI and a urinalysis

## 2018-12-24 NOTE — Telephone Encounter (Signed)
Patient returned call and was advised to check her mobile VM, patient states in the future please call her home # [phone].

## 2018-12-24 NOTE — Telephone Encounter (Signed)
Patient is upset calling back to get an update on whether she can drop off a urinalysis or get something called in for her symptoms. Advised per flow to send a message and Shirron will f/u with patient.

## 2018-12-24 NOTE — Telephone Encounter (Signed)
Pt has been informed orders have been entered.

## 2018-12-25 ENCOUNTER — Ambulatory Visit: Payer: Medicare HMO | Admitting: Family

## 2018-12-25 LAB — URINALYSIS, ROUTINE W REFLEX MICROSCOPIC
Bilirubin Urine: NEGATIVE
Ketones, ur: NEGATIVE
Nitrite: NEGATIVE
SPECIFIC GRAVITY, URINE: 1.01 (ref 1.000–1.030)
Total Protein, Urine: NEGATIVE
URINE GLUCOSE: NEGATIVE
Urobilinogen, UA: 0.2 (ref 0.0–1.0)
pH: 7 (ref 5.0–8.0)

## 2018-12-25 LAB — URINE CULTURE
MICRO NUMBER:: 21872
SPECIMEN QUALITY:: ADEQUATE

## 2018-12-25 MED ORDER — LEVOFLOXACIN 500 MG PO TABS: 500.0000 mg | ORAL_TABLET | Freq: Every day | ORAL | 0 refills | Status: AC

## 2018-12-25 NOTE — Telephone Encounter (Signed)
Pt would like a call back with results and to know if something has been called in to her pharmacy yet. [id number](Phone) [phone](Fax)

## 2018-12-25 NOTE — Addendum Note (Signed)
Addended by: Biagio Borg on: 12/25/2018 08:43 AM   Modules accepted: Orders

## 2018-12-25 NOTE — Telephone Encounter (Signed)
1/7 UA reviewed this AM is c/w possible uti -   Urine culture pending  Ok for levaquin 500 qd x 7 days - done erx

## 2018-12-25 NOTE — Telephone Encounter (Signed)
Pt has been informed.

## 2018-12-31 NOTE — Telephone Encounter (Signed)
Pt called to ask what the message on mychart said. Pt was not able to get into mychart. Pt asked for Nurse to call once Pt hung up I seen the message in the results area. Attempted to call Pt back but no answer / please advise

## 2019-01-08 DIAGNOSIS — G4733 Obstructive sleep apnea (adult) (pediatric): Secondary | ICD-10-CM | POA: Diagnosis not present

## 2019-01-09 ENCOUNTER — Ambulatory Visit (INDEPENDENT_AMBULATORY_CARE_PROVIDER_SITE_OTHER): Payer: Medicare HMO | Admitting: Internal Medicine

## 2019-01-09 ENCOUNTER — Encounter: Payer: Self-pay | Admitting: Internal Medicine

## 2019-01-09 VITALS — BP 116/64 | HR 85 | Temp 98.7°F | Ht 62.0 in | Wt 165.0 lb

## 2019-01-09 DIAGNOSIS — I10 Essential (primary) hypertension: Secondary | ICD-10-CM | POA: Diagnosis not present

## 2019-01-09 DIAGNOSIS — R059 Cough, unspecified: Secondary | ICD-10-CM

## 2019-01-09 DIAGNOSIS — J069 Acute upper respiratory infection, unspecified: Secondary | ICD-10-CM | POA: Diagnosis not present

## 2019-01-09 DIAGNOSIS — R05 Cough: Secondary | ICD-10-CM | POA: Diagnosis not present

## 2019-01-09 MED ORDER — HYDROCODONE-HOMATROPINE 5-1.5 MG/5ML PO SYRP: 5.0000 mL | ORAL_SOLUTION | Freq: Four times a day (QID) | ORAL | 0 refills | Status: AC | PRN

## 2019-01-09 MED ORDER — LEVOFLOXACIN 250 MG PO TABS: 250.0000 mg | ORAL_TABLET | Freq: Every day | ORAL | 0 refills | Status: AC

## 2019-01-09 NOTE — Assessment & Plan Note (Signed)
Also at least temporarily should be improved with tramadol prn, cont PPI

## 2019-01-09 NOTE — Assessment & Plan Note (Signed)
stable overall by history and exam, recent data reviewed with pt, and pt to continue medical treatment as before,  to f/u any worsening symptoms or concerns

## 2019-01-09 NOTE — Progress Notes (Signed)
Subjective:    Patient ID: Maria [name], female    DOB: 08-Nov-1941, 78 y.o.   MRN: [id number]  HPI   Here with 2-3 days acute onset fever, facial pain, pressure, headache, general weakness and malaise, and greenish d/c, with mild ST and cough, but pt denies chest pain, wheezing, increased sob or doe, orthopnea, PND, increased LE swelling, palpitations, dizziness or syncope.  This is worse than her chronic cough thought GI related.  Pt denies new neurological symptoms such as new headache, or facial or extremity weakness or numbness   Pt denies polydipsia, polyuria. Past Medical History:  Diagnosis Date  . Anemia   . Arthritis   . Carpal tunnel syndrome   . Diverticulosis   . GERD (gastroesophageal reflux disease)   . Headache(784.0)    chronic  . High cholesterol   . Hyperlipidemia   . Hypertension   . Migraine   . Nephritis   . OSA (obstructive sleep apnea)   . Palpitations   . Personal history of colonic adenomas 09/15/2010  . TIA (transient ischemic attack)   . Vertigo, benign paroxysmal    Past Surgical History:  Procedure Laterality Date  . BLADDER REPAIR    . BREAST BIOPSY     rt benign cyst  . CARPAL TUNNEL RELEASE     x 2  . COLONOSCOPY    . REPLACEMENT TOTAL KNEE BILATERAL    . TONSILLECTOMY    . UPPER GASTROINTESTINAL ENDOSCOPY      reports that she quit smoking about 49 years ago. Her smoking use included cigarettes. She has a 10.00 pack-year smoking history. She has never used smokeless tobacco. She reports current alcohol use. She reports that she does not use drugs. family history includes Aneurysm in her father; Chronic bronchitis in her mother; Colon cancer in her maternal grandmother; Colon polyps in her mother; Diabetes in her brother and father; Emphysema in her maternal grandfather; Heart disease in her mother; Hypertension in her father; Irritable bowel syndrome in her daughter; Kidney disease in her father. Allergies  Allergen Reactions  .  Amoxicillin Hives  . Ace Inhibitors Other (See Comments)    unknown  . Bee Venom   . Benicar [Olmesartan Medoxomil] Other (See Comments)    Tongue swelling  . Codeine     REACTION: nausea  . Lisinopril Other (See Comments)    unknown  . Macrobid [Nitrofurantoin Monohyd Macro]     Pulmonary Fibrosis  . Nutritional Supplements     Bee stings cause angioedema-carry an EPIPEN   Current Outpatient Medications on File Prior to Visit  Medication Sig Dispense Refill  . albuterol (PROAIR HFA) 108 (90 Base) MCG/ACT inhaler INHALE 2 PUFFS INTO THE LUNCH EVERY 6 HOURS AS NEEDED FOR WHEEZING OR SHORTNESS OF BREATH 8.5 Inhaler 2  . amLODipine (NORVASC) 5 MG tablet TAKE 1 TABLET EVERY DAY 90 tablet 1  . aspirin 81 MG tablet Take 81 mg by mouth daily.    . Azelastine-Fluticasone (DYMISTA) 137-50 MCG/ACT SUSP Place 1 puff into the nose 2 (two) times daily. 6 g 5  . b complex vitamins tablet Take 1 tablet by mouth daily.      Marland Kitchen CALCIUM PO Take by mouth daily.      . Cholecalciferol (VITAMIN D-3 PO) Take by mouth daily.      . clopidogrel (PLAVIX) 75 MG tablet TAKE 1 TABLET EVERY DAY 90 tablet 1  . hydrochlorothiazide (MICROZIDE) 12.5 MG capsule TAKE 1 CAPSULE EVERY DAY 90 capsule  1  . levocetirizine (XYZAL) 5 MG tablet TAKE 1 TABLET EVERY EVENING 90 tablet 1  . magnesium oxide (MAG-OX) 400 MG tablet Take 400 mg by mouth daily.    . montelukast (SINGULAIR) 10 MG tablet TAKE 1 TABLET AT BEDTIME 90 tablet 1  . oxybutynin (DITROPAN-XL) 10 MG 24 hr tablet TAKE 1 TABLET BY MOUTH Q DAILY  11  . potassium chloride (K-DUR) 10 MEQ tablet TAKE 1 TABLET EVERY DAY 90 tablet 1  . simvastatin (ZOCOR) 10 MG tablet TAKE 1 TABLET EVERY DAY  AT  6  PM 90 tablet 1  . SYMBICORT 160-4.5 MCG/ACT inhaler INHALE 2 PUFFS TWICE DAILY 3 Inhaler 3  . terconazole (TERAZOL 3) 80 MG vaginal suppository Place 1 suppository (80 mg total) vaginally at bedtime. 3 suppository 2   No current facility-administered medications on file  prior to visit.    Review of Systems  Constitutional: Negative for other unusual diaphoresis or sweats HENT: Negative for ear discharge or swelling Eyes: Negative for other worsening visual disturbances Respiratory: Negative for stridor or other swelling  Gastrointestinal: Negative for worsening distension or other blood Genitourinary: Negative for retention or other urinary change Musculoskeletal: Negative for other MSK pain or swelling Skin: Negative for color change or other new lesions Neurological: Negative for worsening tremors and other numbness  Psychiatric/Behavioral: Negative for worsening agitation or other fatigue All other system neg per pt    Objective:   Physical Exam BP 116/64   Pulse 85   Temp 98.7 F (37.1 C) (Oral)   Ht _0  (1.575 m)   Wt 165 lb (74.8 kg)   SpO2 96%   BMI 30.18 kg/m  VS noted, mild ill Constitutional: Pt appears in NAD HENT: Head: NCAT.  Right Ear: External ear normal.  Left Ear: External ear normal.  Eyes: . Pupils are equal, round, and reactive to light. Conjunctivae and EOM are normal Nose: without d/c or deformity Bilat tm's with mild erythema.  Max sinus areas mild tender.  Pharynx with mild erythema, no exudate Neck: Neck supple. Gross normal ROM Cardiovascular: Normal rate and regular rhythm.   Pulmonary/Chest: Effort normal and breath sounds without rales or wheezing.  Neurological: Pt is alert. At baseline orientation, motor grossly intact Skin: Skin is warm. No rashes, other new lesions, no LE edema Psychiatric: Pt behavior is normal without agitation  No other exam findings Lab Results  Component Value Date   WBC 6.2 02/18/2018   HGB 13.9 02/18/2018   HCT 41.8 02/18/2018   PLT 306.0 02/18/2018   GLUCOSE 91 02/18/2018   CHOL 137 12/01/2016   TRIG 52.0 12/01/2016   HDL 60.30 12/01/2016   LDLDIRECT 113.2 10/23/2013   LDLCALC 66 12/01/2016   ALT 20 02/18/2018   AST 22 02/18/2018   [name] 139 02/18/2018   K 3.7 02/18/2018    CL 104 02/18/2018   CREATININE 0.66 02/18/2018   BUN 11 02/18/2018   CO2 28 02/18/2018   TSH 1.57 02/18/2018   INR 0.96 01/31/2011   HGBA1C 5.9 02/18/2018       Assessment & Plan:

## 2019-01-09 NOTE — Patient Instructions (Signed)
Please take all new medication as prescribed - the antibiotic, and cough medicine as needed  Please continue all other medications as before, and refills have been done if requested.  Please have the pharmacy call with any other refills you may need.  Please continue your efforts at being more active, low cholesterol diet, and weight control.  Please keep your appointments with your specialists as you may have planned

## 2019-01-09 NOTE — Assessment & Plan Note (Signed)
Mild to mod, for antibx course,  to f/u any worsening symptoms or concerns 

## 2019-02-20 ENCOUNTER — Encounter: Payer: Self-pay | Admitting: Internal Medicine

## 2019-02-21 DIAGNOSIS — I10 Essential (primary) hypertension: Secondary | ICD-10-CM | POA: Diagnosis not present

## 2019-02-21 DIAGNOSIS — S0990XA Unspecified injury of head, initial encounter: Secondary | ICD-10-CM | POA: Diagnosis not present

## 2019-02-27 ENCOUNTER — Telehealth: Payer: Self-pay

## 2019-02-27 NOTE — Telephone Encounter (Signed)
Left message for patient to call back to schedule.  °

## 2019-03-03 ENCOUNTER — Ambulatory Visit (INDEPENDENT_AMBULATORY_CARE_PROVIDER_SITE_OTHER): Payer: Medicare HMO | Admitting: Family Medicine

## 2019-03-03 ENCOUNTER — Other Ambulatory Visit: Payer: Self-pay

## 2019-03-03 ENCOUNTER — Telehealth: Payer: Self-pay

## 2019-03-03 DIAGNOSIS — G44309 Post-traumatic headache, unspecified, not intractable: Secondary | ICD-10-CM | POA: Insufficient documentation

## 2019-03-03 DIAGNOSIS — R42 Dizziness and giddiness: Secondary | ICD-10-CM

## 2019-03-03 NOTE — Assessment & Plan Note (Signed)
Patient has more of a posttraumatic headache.  Patient did have a CT scan did not show any significant bleeding, if anything symptoms are improving she states.  I believe the patient will do well with conservative therapy.  Patient is concerned more for the vertigo aspect and is to follow-up with vestibular neuro physical therapy that I think will be beneficial.  Patient is on Plavix and knows if any worsening headaches, worsening symptoms to seek medical attention immediately and even go to emergency room.  Patient is set up to see me again in 4 weeks to see how she does otherwise.  Patient is in agreement with this plan and has our concussion hotline if she can call with any questions.
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
## 2019-03-04 ENCOUNTER — Inpatient Hospital Stay: Payer: Medicare HMO | Admitting: Internal Medicine

## 2019-03-17 ENCOUNTER — Telehealth: Payer: Self-pay

## 2019-03-17 NOTE — Telephone Encounter (Signed)
Copied from CRM #[id number]. Topic: Referral - Status >> Mar 13, 2019  3:30 PM Harrald, Kathy J wrote: Reason for CRM: pt states she has not heard anything from PT.  Pt would like at least someone to give some home exercises as she continues to be unsteady. Gave pt the phone number for [facility], but not sure if they are even seeing pts at this time. Pt wanted Dr Smith to know. 

## 2019-03-17 NOTE — Telephone Encounter (Signed)
Copied from [place] [id number]. Topic: Referral - Status >> Mar 13, 2019  3:30 PM Scherrie Gerlach wrote: Reason for CRM: pt states she has not heard anything from PT.  Pt would like at least someone to give some home exercises as she continues to be unsteady. Gave pt the phone number for [facility], but not sure if they are even seeing pts at this time. Pt wanted Dr Tamala Julian to know.

## 2019-03-17 NOTE — Telephone Encounter (Signed)
Spoke with patient that she can work on balance at home holding onto countertop and standing on one foot. She is suppose to have PT appointment this week. Told patient she could consider pushing that appointment out and work on her balance at this time due to Covid outbreak. Told her that she should call [facility] as well to see if they are seeing patients at this time. Patient voices understanding.

## 2019-03-17 NOTE — Telephone Encounter (Signed)
Left message for patient to call back to discuss rehab exercises.

## 2019-03-20 DIAGNOSIS — R42 Dizziness and giddiness: Secondary | ICD-10-CM | POA: Diagnosis not present

## 2019-03-26 ENCOUNTER — Telehealth: Payer: Self-pay

## 2019-03-26 NOTE — Telephone Encounter (Signed)
Called patient to reschedule 04/03/2019. Left message.

## 2019-03-31 ENCOUNTER — Encounter: Payer: Self-pay | Admitting: Family Medicine

## 2019-04-02 NOTE — Progress Notes (Deleted)
[name] [address], [place] [address] Phone: [phone] Subjective:    I'm seeing this patient by the request  of:    CC:   [id number]  Maria [name] is a 78 y.o. female coming in with complaint of ***  Onset-  Location Duration-  Character- Aggravating factors- Reliving factors-  Therapies tried-  Severity-     Past Medical History:  Diagnosis Date  . Anemia   . Arthritis   . Carpal tunnel syndrome   . Diverticulosis   . GERD (gastroesophageal reflux disease)   . Headache(784.0)    chronic  . High cholesterol   . Hyperlipidemia   . Hypertension   . Migraine   . Nephritis   . OSA (obstructive sleep apnea)   . Palpitations   . Personal history of colonic adenomas 09/15/2010  . TIA (transient ischemic attack)   . Vertigo, benign paroxysmal    Past Surgical History:  Procedure Laterality Date  . BLADDER REPAIR    . BREAST BIOPSY     rt benign cyst  . CARPAL TUNNEL RELEASE     x 2  . COLONOSCOPY    . REPLACEMENT TOTAL KNEE BILATERAL    . TONSILLECTOMY    . UPPER GASTROINTESTINAL ENDOSCOPY     Social History   Socioeconomic History  . Marital status: Divorced    Spouse name: Not on file  . Number of children: 3  . Years of education: Not on file  . Highest education level: Not on file  Occupational History  . Occupation: retired  Scientific laboratory technician  . Financial resource strain: Not hard at all  . Food insecurity:    Worry: Never true    Inability: Never true  . Transportation needs:    Medical: No    Non-medical: No  Tobacco Use  . Smoking status: Former Smoker    Packs/day: 1.00    Years: 10.00    Pack years: 10.00    Types: Cigarettes    Last attempt to quit: 12/18/1969    Years since quitting: 49.3  . Smokeless tobacco: Never Used  Substance and Sexual Activity  . Alcohol use: Yes    Comment: ocassional use  . Drug use: No  . Sexual activity: Not Currently  Lifestyle  . Physical activity:     Days per week: 3 days    Minutes per session: 30 min  . Stress: Rather much  Relationships  . Social connections:    Talks on phone: More than three times a week    Gets together: More than three times a week    Attends religious service: 1 to 4 times per year    Active member of club or organization: Yes    Attends meetings of clubs or organizations: Not on file    Relationship status: Not on file  Other Topics Concern  . Not on file  Social History Narrative  . Not on file   Allergies  Allergen Reactions  . Amoxicillin Hives  . Ace Inhibitors Other (See Comments)    unknown  . Bee Venom   . Benicar [Olmesartan Medoxomil] Other (See Comments)    Tongue swelling  . Codeine     REACTION: nausea  . Lisinopril Other (See Comments)    unknown  . Macrobid [Nitrofurantoin Monohyd Macro]     Pulmonary Fibrosis  . Nutritional Supplements     Bee stings cause angioedema-carry an EPIPEN   Family History  Problem Relation Age of Onset  . Diabetes Father   . Aneurysm Father   . Kidney disease Father   . Hypertension Father   . Heart disease Mother   . Colon polyps Mother   . Chronic bronchitis Mother   . Diabetes Brother   . Colon cancer Maternal Grandmother   . Irritable bowel syndrome Daughter   . Emphysema Maternal Grandfather        smoked a pipe     Current Outpatient Medications (Cardiovascular):  .  amLODipine (NORVASC) 5 MG tablet, TAKE 1 TABLET EVERY DAY .  hydrochlorothiazide (MICROZIDE) 12.5 MG capsule, TAKE 1 CAPSULE EVERY DAY .  simvastatin (ZOCOR) 10 MG tablet, TAKE 1 TABLET EVERY DAY  AT  6  PM  Current Outpatient Medications (Respiratory):  .  albuterol (PROAIR HFA) 108 (90 Base) MCG/ACT inhaler, INHALE 2 PUFFS INTO THE LUNCH EVERY 6 HOURS AS NEEDED FOR WHEEZING OR SHORTNESS OF BREATH .  Azelastine-Fluticasone (DYMISTA) 137-50 MCG/ACT SUSP, Place 1 puff into the nose 2 (two) times daily. Marland Kitchen  levocetirizine (XYZAL) 5 MG tablet, TAKE 1 TABLET EVERY  EVENING .  montelukast (SINGULAIR) 10 MG tablet, TAKE 1 TABLET AT BEDTIME .  SYMBICORT 160-4.5 MCG/ACT inhaler, INHALE 2 PUFFS TWICE DAILY  Current Outpatient Medications (Analgesics):  .  aspirin 81 MG tablet, Take 81 mg by mouth daily.  Current Outpatient Medications (Hematological):  .  clopidogrel (PLAVIX) 75 MG tablet, TAKE 1 TABLET EVERY DAY  Current Outpatient Medications (Other):  .  b complex vitamins tablet, Take 1 tablet by mouth daily.   Marland Kitchen  CALCIUM PO, Take by mouth daily.   .  Cholecalciferol (VITAMIN D-3 PO), Take by mouth daily.   .  magnesium oxide (MAG-OX) 400 MG tablet, Take 400 mg by mouth daily. Marland Kitchen  oxybutynin (DITROPAN-XL) 10 MG 24 hr tablet, TAKE 1 TABLET BY MOUTH Q DAILY .  potassium chloride (K-DUR) 10 MEQ tablet, TAKE 1 TABLET EVERY DAY .  terconazole (TERAZOL 3) 80 MG vaginal suppository, Place 1 suppository (80 mg total) vaginally at bedtime.    Past medical history, social, surgical and family history all reviewed in electronic medical record.  No pertanent information unless stated regarding to the chief complaint.   Review of Systems:  No headache, visual changes, nausea, vomiting, diarrhea, constipation, dizziness, abdominal pain, skin rash, fevers, chills, night sweats, weight loss, swollen lymph nodes, body aches, joint swelling, muscle aches, chest pain, shortness of breath, mood changes.   Objective  There were no vitals taken for this visit. Systems examined below as of    General: No apparent distress alert and oriented x3 mood and affect normal, dressed appropriately.  HEENT: Pupils equal, extraocular movements intact  Respiratory: Patient's speak in full sentences and does not appear short of breath  Cardiovascular: No lower extremity edema, non tender, no erythema  Skin: Warm dry intact with no signs of infection or rash on extremities or on axial skeleton.  Abdomen: Soft nontender  Neuro: Cranial nerves II through XII are intact,  neurovascularly intact in all extremities with 2+ DTRs and 2+ pulses.  Lymph: No lymphadenopathy of posterior or anterior cervical chain or axillae bilaterally.  Gait normal with good balance and coordination.  MSK:  Non tender with full range of motion and good stability and symmetric strength and tone of shoulders, elbows, wrist, hip, knee and ankles bilaterally.     Impression and Recommendations:     This case required medical decision making of moderate complexity. The  above documentation has been reviewed and is accurate and complete Lyndal Pulley, DO       Note: This dictation was prepared with Dragon dictation along with smaller phrase technology. Any transcriptional errors that result from this process are unintentional.

## 2019-04-03 ENCOUNTER — Ambulatory Visit: Payer: Medicare HMO | Admitting: Family Medicine

## 2019-04-16 DIAGNOSIS — R42 Dizziness and giddiness: Secondary | ICD-10-CM | POA: Diagnosis not present

## 2019-04-17 ENCOUNTER — Encounter: Payer: Self-pay | Admitting: Family Medicine

## 2019-04-17 ENCOUNTER — Ambulatory Visit (INDEPENDENT_AMBULATORY_CARE_PROVIDER_SITE_OTHER): Payer: Medicare HMO | Admitting: Family Medicine

## 2019-04-17 DIAGNOSIS — G44309 Post-traumatic headache, unspecified, not intractable: Secondary | ICD-10-CM

## 2019-04-17 NOTE — Progress Notes (Signed)
[facility] [place],  [address] Phone: [phone] Subjective:    Virtual Visit via Video Note  I connected with Maria [name] on 04/17/19 at  1:00 PM EDT by a video enabled telemedicine application and verified that I am speaking with the correct person using two identifiers.     I discussed the limitations of evaluation and management by telemedicine and the availability of in person appointments. The patient expressed understanding and agreed to proceed.  Patient was in home setting and I was in the office setting.   I discussed the assessment and treatment plan with the patient. The patient was provided an opportunity to ask questions and all were answered. The patient agreed with the plan and demonstrated an understanding of the instructions.   The patient was advised to call back or seek an in-person evaluation if the symptoms worsen or if the condition fails to improve as anticipated.  I provided 10 minutes of non-face-to-face time during this encounter.   Lyndal Pulley, DO  :    CC: Head injury follow-up with vertigo  OAC:[id number]  Maria [name] is a 78 y.o. female coming in with complaint of patient was having vestibular neuro difficulties.  Patient went to physical therapy 2 times.  Taking vitamin supplementation and feels 95% better.  No significant lingering effects at the moment.  Feels almost like herself.    Past Medical History:  Diagnosis Date  . Anemia   . Arthritis   . Carpal tunnel syndrome   . Diverticulosis   . GERD (gastroesophageal reflux disease)   . Headache(784.0)    chronic  . High cholesterol   . Hyperlipidemia   . Hypertension   . Migraine   . Nephritis   . OSA (obstructive sleep apnea)   . Palpitations   . Personal history of colonic adenomas 09/15/2010  . TIA (transient ischemic attack)   . Vertigo, benign paroxysmal    Past Surgical History:  Procedure Laterality Date  .  BLADDER REPAIR    . BREAST BIOPSY     rt benign cyst  . CARPAL TUNNEL RELEASE     x 2  . COLONOSCOPY    . REPLACEMENT TOTAL KNEE BILATERAL    . TONSILLECTOMY    . UPPER GASTROINTESTINAL ENDOSCOPY     Social History   Socioeconomic History  . Marital status: Divorced    Spouse name: Not on file  . Number of children: 3  . Years of education: Not on file  . Highest education level: Not on file  Occupational History  . Occupation: retired  Scientific laboratory technician  . Financial resource strain: Not hard at all  . Food insecurity:    Worry: Never true    Inability: Never true  . Transportation needs:    Medical: No    Non-medical: No  Tobacco Use  . Smoking status: Former Smoker    Packs/day: 1.00    Years: 10.00    Pack years: 10.00    Types: Cigarettes    Last attempt to quit: 12/18/1969    Years since quitting: 49.3  . Smokeless tobacco: Never Used  Substance and Sexual Activity  . Alcohol use: Yes    Comment: ocassional use  . Drug use: No  . Sexual activity: Not Currently  Lifestyle  . Physical activity:    Days per week: 3 days    Minutes per session: 30 min  . Stress: Rather much  Relationships  .  Social connections:    Talks on phone: More than three times a week    Gets together: More than three times a week    Attends religious service: 1 to 4 times per year    Active member of club or organization: Yes    Attends meetings of clubs or organizations: Not on file    Relationship status: Not on file  Other Topics Concern  . Not on file  Social History Narrative  . Not on file   Allergies  Allergen Reactions  . Amoxicillin Hives  . Ace Inhibitors Other (See Comments)    unknown  . Bee Venom   . Benicar [Olmesartan Medoxomil] Other (See Comments)    Tongue swelling  . Codeine     REACTION: nausea  . Lisinopril Other (See Comments)    unknown  . Macrobid [Nitrofurantoin Monohyd Macro]     Pulmonary Fibrosis  . Nutritional Supplements     Bee stings cause  angioedema-carry an EPIPEN   Family History  Problem Relation Age of Onset  . Diabetes Father   . Aneurysm Father   . Kidney disease Father   . Hypertension Father   . Heart disease Mother   . Colon polyps Mother   . Chronic bronchitis Mother   . Diabetes Brother   . Colon cancer Maternal Grandmother   . Irritable bowel syndrome Daughter   . Emphysema Maternal Grandfather        smoked a pipe     Current Outpatient Medications (Cardiovascular):  .  amLODipine (NORVASC) 5 MG tablet, TAKE 1 TABLET EVERY DAY .  hydrochlorothiazide (MICROZIDE) 12.5 MG capsule, TAKE 1 CAPSULE EVERY DAY .  simvastatin (ZOCOR) 10 MG tablet, TAKE 1 TABLET EVERY DAY  AT  6  PM  Current Outpatient Medications (Respiratory):  .  albuterol (PROAIR HFA) 108 (90 Base) MCG/ACT inhaler, INHALE 2 PUFFS INTO THE LUNCH EVERY 6 HOURS AS NEEDED FOR WHEEZING OR SHORTNESS OF BREATH .  Azelastine-Fluticasone (DYMISTA) 137-50 MCG/ACT SUSP, Place 1 puff into the nose 2 (two) times daily. Marland Kitchen  levocetirizine (XYZAL) 5 MG tablet, TAKE 1 TABLET EVERY EVENING .  montelukast (SINGULAIR) 10 MG tablet, TAKE 1 TABLET AT BEDTIME .  SYMBICORT 160-4.5 MCG/ACT inhaler, INHALE 2 PUFFS TWICE DAILY  Current Outpatient Medications (Analgesics):  .  aspirin 81 MG tablet, Take 81 mg by mouth daily.  Current Outpatient Medications (Hematological):  .  clopidogrel (PLAVIX) 75 MG tablet, TAKE 1 TABLET EVERY DAY  Current Outpatient Medications (Other):  .  b complex vitamins tablet, Take 1 tablet by mouth daily.   Marland Kitchen  CALCIUM PO, Take by mouth daily.   .  Cholecalciferol (VITAMIN D-3 PO), Take by mouth daily.   .  magnesium oxide (MAG-OX) 400 MG tablet, Take 400 mg by mouth daily. Marland Kitchen  oxybutynin (DITROPAN-XL) 10 MG 24 hr tablet, TAKE 1 TABLET BY MOUTH Q DAILY .  potassium chloride (K-DUR) 10 MEQ tablet, TAKE 1 TABLET EVERY DAY .  terconazole (TERAZOL 3) 80 MG vaginal suppository, Place 1 suppository (80 mg total) vaginally at bedtime.     Past medical history, social, surgical and family history all reviewed in electronic medical record.  No pertanent information unless stated regarding to the chief complaint.   Review of Systems:  No headache, visual changes, nausea, vomiting, diarrhea, constipation, dizziness, abdominal pain, skin rash, fevers, chills, night sweats, weight loss, swollen lymph nodes, body aches, joint swelling, muscle aches, chest pain, shortness of breath, mood changes.  Objective     Patient did not have any shortness of breath.  Did have some difficulty with the video aspect of the platform so most of it was done audio.   Impression and Recommendations:     . The above documentation has been reviewed and is accurate and complete Lyndal Pulley, DO       Note: This dictation was prepared with Dragon dictation along with smaller phrase technology. Any transcriptional errors that result from this process are unintentional.

## 2019-04-17 NOTE — Assessment & Plan Note (Signed)
Imaging improvement with conservative therapy.  As long as patient is well follow-up as needed

## 2019-05-30 DIAGNOSIS — G4733 Obstructive sleep apnea (adult) (pediatric): Secondary | ICD-10-CM | POA: Diagnosis not present

## 2019-06-12 ENCOUNTER — Ambulatory Visit: Payer: Self-pay | Admitting: *Deleted

## 2019-06-12 NOTE — Telephone Encounter (Signed)
Pt calling with complaints of urinary frequency and pain that started on yesterday. Pt states that her urine is clear in color due to drinking a lot of water but also states that is foamy and cloudy. Pt states she did have blood after wiping one today today with urination. Pt states she has a history of UTIs and thinks that is what she has currently. Pt states she has a burning sensation in her urethra with urinating and also feels it before urinating. Pt advised if she becomes worse during the night to seek treatment in the Urgent Care/ED. Pt verbalized understanding. Pt can be contacted on mobile number [phone].  Reason for Disposition . > 2 UTI's in last year  Answer Assessment - Initial Assessment Questions 1. SEVERITY: "How bad is the pain?"  (e.g., Scale 1-10; mild, moderate, or severe)   - MILD (1-3): complains slightly about urination hurting   - MODERATE (4-7): interferes with normal activities     - SEVERE (8-10): excruciating, unwilling or unable to urinate because of the pain      4, pain with urinating burning pain and also before urinating pain in urethra 2. FREQUENCY: "How many times have you had painful urination today?"      With urinating everytime today 3. PATTERN: "Is pain present every time you urinate or just sometimes?"      Yes with urinating and also with the urge to urinat 4. ONSET: "When did the painful urination start?"      Yes and describes it as a burning senstation 5. FEVER: "Do you have a fever?" If so, ask: "What is your temperature, how was it measured, and when did it start?"     NO 6. PAST UTI: "Have you had a urine infection before?" If so, ask: "When was the last time?" and "What happened that time?"      Yes 7. CAUSE: "What do you think is causing the painful urination?"  (e.g., UTI, scratch, Herpes sore)     Urinary tract infection 8. OTHER SYMPTOMS: "Do you have any other symptoms?" (e.g., flank pain, vaginal discharge, genital sores, urgency,  blood in urine)     Did have a little blood in urine today with one wipe 9. PREGNANCY: "Is there any chance you are pregnant?" "When was your last menstrual period?"    n/a  Protocols used: [place]

## 2019-06-12 NOTE — Telephone Encounter (Signed)
Message from Beverley Fiedler sent at 06/12/2019 4:19 PM EDT  Summary: Clinical Advice   Patient would like to speak with nurse in regards to her frequent urination and pain. Please advise.          Patient called and left message to return call to office.

## 2019-06-13 ENCOUNTER — Ambulatory Visit (INDEPENDENT_AMBULATORY_CARE_PROVIDER_SITE_OTHER): Payer: Medicare HMO | Admitting: Internal Medicine

## 2019-06-13 DIAGNOSIS — R300 Dysuria: Secondary | ICD-10-CM | POA: Diagnosis not present

## 2019-06-13 MED ORDER — LEVOFLOXACIN 500 MG PO TABS: 500.0000 mg | ORAL_TABLET | Freq: Every day | ORAL | 0 refills | Status: AC

## 2019-06-13 NOTE — Telephone Encounter (Signed)
LVM for patient to call back and set up appointment.

## 2019-06-13 NOTE — Progress Notes (Signed)
Patient ID: Maria [name], female   DOB: [birth year], 78 y.o.   MRN: [id number]  Cumulative time during 7-day interval 11 min, there was not an associated office visit for this concern within a 7 day period.  Verbal consent for services obtained from patient prior to services given.  Names of all persons present for services: Cathlean Cower, MD, patient  Chief complaint: dysuria  History, background, results pertinent:   Here with 2-3 days with dysuria and freq and cloudy;  No fever, abd or flank pain, n/v, chills.  Denies urinary symptoms such as urgency, hematuria or n/v.  Pt denies chest pain, increased sob or doe, wheezing, orthopnea, PND, increased LE swelling, palpitations, dizziness or syncope. Pt states simply cannot come to lab as too scared during the pandemic  Past Medical History:  Diagnosis Date  . Anemia   . Arthritis   . Carpal tunnel syndrome   . Diverticulosis   . GERD (gastroesophageal reflux disease)   . Headache(784.0)    chronic  . High cholesterol   . Hyperlipidemia   . Hypertension   . Migraine   . Nephritis   . OSA (obstructive sleep apnea)   . Palpitations   . Personal history of colonic adenomas 09/15/2010  . TIA (transient ischemic attack)   . Vertigo, benign paroxysmal    No results found for this or any previous visit (from the past 48 hour(s)). Current Outpatient Medications on File Prior to Visit  Medication Sig Dispense Refill  . albuterol (PROAIR HFA) 108 (90 Base) MCG/ACT inhaler INHALE 2 PUFFS INTO THE LUNCH EVERY 6 HOURS AS NEEDED FOR WHEEZING OR SHORTNESS OF BREATH 8.5 Inhaler 2  . amLODipine (NORVASC) 5 MG tablet TAKE 1 TABLET EVERY DAY 90 tablet 1  . aspirin 81 MG tablet Take 81 mg by mouth daily.    . Azelastine-Fluticasone (DYMISTA) 137-50 MCG/ACT SUSP Place 1 puff into the nose 2 (two) times daily. 6 g 5  . b complex vitamins tablet Take 1 tablet by mouth daily.      Marland Kitchen CALCIUM PO Take by mouth daily.      . Cholecalciferol (VITAMIN D-3  PO) Take by mouth daily.      . clopidogrel (PLAVIX) 75 MG tablet TAKE 1 TABLET EVERY DAY 90 tablet 1  . hydrochlorothiazide (MICROZIDE) 12.5 MG capsule TAKE 1 CAPSULE EVERY DAY 90 capsule 1  . levocetirizine (XYZAL) 5 MG tablet TAKE 1 TABLET EVERY EVENING 90 tablet 1  . magnesium oxide (MAG-OX) 400 MG tablet Take 400 mg by mouth daily.    . montelukast (SINGULAIR) 10 MG tablet TAKE 1 TABLET AT BEDTIME 90 tablet 1  . oxybutynin (DITROPAN-XL) 10 MG 24 hr tablet TAKE 1 TABLET BY MOUTH Q DAILY  11  . potassium chloride (K-DUR) 10 MEQ tablet TAKE 1 TABLET EVERY DAY 90 tablet 1  . simvastatin (ZOCOR) 10 MG tablet TAKE 1 TABLET EVERY DAY  AT  6  PM 90 tablet 1  . SYMBICORT 160-4.5 MCG/ACT inhaler INHALE 2 PUFFS TWICE DAILY 3 Inhaler 3  . terconazole (TERAZOL 3) 80 MG vaginal suppository Place 1 suppository (80 mg total) vaginally at bedtime. 3 suppository 2   No current facility-administered medications on file prior to visit.    A/P/next steps:   Dysuria - likely uti, unable for urine studies as pt just not willing to leave her home during the panedmic, for empiric levaquin asd,  to f/u any worsening symptoms or concerns  Cathlean Cower MD

## 2019-06-13 NOTE — Telephone Encounter (Signed)
Noted  

## 2019-06-15 ENCOUNTER — Encounter: Payer: Self-pay | Admitting: Internal Medicine

## 2019-06-15 NOTE — Patient Instructions (Signed)
Please take all new medication as prescribed - the antibiotic  Please continue all other medications as before, and refills have been done if requested.  Please have the pharmacy call with any other refills you may need.  Please keep your appointments with your specialists as you may have planned

## 2019-06-15 NOTE — Assessment & Plan Note (Signed)
See notes

## 2019-07-24 ENCOUNTER — Telehealth: Payer: Self-pay | Admitting: Internal Medicine

## 2019-07-24 MED ORDER — HYDROCHLOROTHIAZIDE 12.5 MG PO CAPS: 12.5000 mg | ORAL_CAPSULE | Freq: Every day | ORAL | 1 refills | Status: DC

## 2019-07-24 MED ORDER — CLOPIDOGREL BISULFATE 75 MG PO TABS: 75.0000 mg | ORAL_TABLET | Freq: Every day | ORAL | 1 refills | Status: DC

## 2019-07-24 MED ORDER — AMLODIPINE BESYLATE 5 MG PO TABS: 5.0000 mg | ORAL_TABLET | Freq: Every day | ORAL | 1 refills | Status: DC

## 2019-07-24 MED ORDER — MONTELUKAST SODIUM 10 MG PO TABS: 10.0000 mg | ORAL_TABLET | Freq: Every day | ORAL | 1 refills | Status: DC

## 2019-07-24 MED ORDER — POTASSIUM CHLORIDE ER 10 MEQ PO TBCR: 10.0000 meq | EXTENDED_RELEASE_TABLET | Freq: Every day | ORAL | 1 refills | Status: DC

## 2019-07-24 MED ORDER — LEVOCETIRIZINE DIHYDROCHLORIDE 5 MG PO TABS: 5.0000 mg | ORAL_TABLET | Freq: Every evening | ORAL | 1 refills | Status: DC

## 2019-07-24 MED ORDER — SIMVASTATIN 10 MG PO TABS: ORAL_TABLET | ORAL | 1 refills | Status: DC

## 2019-07-24 NOTE — Telephone Encounter (Signed)
Patient calling because [facility] faxed the office requesting 6 scripts for her within the last 3 days and she has not heard anything. Please advise.

## 2019-08-06 NOTE — Telephone Encounter (Signed)
I spoke to the patient and informed her that they were sent to [facility] on 8/6. She demanded that I call them because she doesn't have her medication. I called [employer] and spoke to a representative in the pharmacy dept. The representative informed me that they did receive all 7 scripts on 8/6. She and I went over each med name that was submitted for refill and stated they were all shipped out on 8/12 and delivered on 8/15 at 11.24am. After disconnecting with the representative I reached back out to the patient and let her know they above information. She stated that she did not receive any of them but she would check with the post office.

## 2019-08-06 NOTE — Telephone Encounter (Signed)
Patient called in stating she has received a letter from [facility] stating they have still not received the medications. Patient is wanting them to be called in ASAP, due to not having them. Please advise as patient is unhappy this has not been resolved. [employer] number is [phone]. Patient is wanting a call back when this is done at [phone].

## 2019-08-07 NOTE — Progress Notes (Addendum)
Subjective:   Maria [name] is a 78 y.o. female who presents for Medicare Annual (Subsequent) preventive examination. I connected with patient by a telephone and verified that I am speaking with the correct person using two identifiers. Patient stated full name and DOB. Patient gave permission to continue with telephonic visit. Patient's location was at home and Nurse's location was at [place] office.   Review of Systems:   Cardiac Risk Factors include: advanced age (>39mn, >>42women);dyslipidemia;hypertension Sleep patterns: feels rested on waking, gets up 1-2 times nightly to void and sleeps 7 hours nightly.    Home Safety/Smoke Alarms: Feels safe in home. Smoke alarms in place.  Living environment; residence and Firearm Safety: 1-story house/ trailer. Lives alone, no needs for DME, good support system Seat Belt Safety/Bike Helmet: Wears seat belt.     Objective:     Vitals: There were no vitals taken for this visit.  There is no height or weight on file to calculate BMI.  Advanced Directives 08/08/2019 05/30/2018 11/16/2017 07/31/2017 05/29/2017 10/13/2016  Does Patient Have a Medical Advance Directive? Yes Yes No Yes Yes No  Type of AParamedicof [place] will [place] will - [place] will [place] will -  Does patient want to make changes to medical advance directive? - - - No - Patient declined - -  Copy of [place] Chart? No - copy requested No - copy requested - No - copy requested No - copy requested -  Would patient like information on creating a medical advance directive? - - No - Patient declined - - No - patient declined information    Tobacco Social History   Tobacco Use  Smoking Status Former Smoker  . Packs/day: 1.00  . Years: 10.00  . Pack years: 10.00  . Types: Cigarettes  . Quit date: 12/18/1969  . Years since quitting: 49.6   Smokeless Tobacco Never Used     Counseling given: Not Answered  Past Medical History:  Diagnosis Date  . Anemia   . Arthritis   . Bronchitis   . Carpal tunnel syndrome    in calves neuropathy  . Diverticulosis   . GERD (gastroesophageal reflux disease)   . Headache(784.0)    chronic  . High cholesterol   . Hyperlipidemia   . Hypertension   . Migraine   . Nephritis   . OSA (obstructive sleep apnea)   . Palpitations   . Personal history of colonic adenomas 09/15/2010  . TIA (transient ischemic attack)   . Vertigo, benign paroxysmal    Past Surgical History:  Procedure Laterality Date  . BLADDER REPAIR    . BREAST BIOPSY     rt benign cyst  . CARPAL TUNNEL RELEASE     x 2  . COLONOSCOPY    . REPLACEMENT TOTAL KNEE BILATERAL    . TONSILLECTOMY    . UPPER GASTROINTESTINAL ENDOSCOPY     Family History  Problem Relation Age of Onset  . Diabetes Father   . Aneurysm Father   . Kidney disease Father   . Hypertension Father   . Heart disease Mother   . Colon polyps Mother   . Chronic bronchitis Mother   . Diabetes Brother   . Colon cancer Maternal Grandmother   . Irritable bowel syndrome Daughter   . Emphysema Maternal Grandfather        smoked a pipe   Social History  Socioeconomic History  . Marital status: Divorced    Spouse name: Not on file  . Number of children: 3  . Years of education: Not on file  . Highest education level: Not on file  Occupational History  . Occupation: retired  Scientific laboratory technician  . Financial resource strain: Not hard at all  . Food insecurity    Worry: Never true    Inability: Never true  . Transportation needs    Medical: No    Non-medical: No  Tobacco Use  . Smoking status: Former Smoker    Packs/day: 1.00    Years: 10.00    Pack years: 10.00    Types: Cigarettes    Quit date: 12/18/1969    Years since quitting: 49.6  . Smokeless tobacco: Never Used  Substance and Sexual Activity  . Alcohol use: Yes    Comment: ocassional  use  . Drug use: No  . Sexual activity: Not Currently  Lifestyle  . Physical activity    Days per week: 2 days    Minutes per session: 40 min  . Stress: Only a little  Relationships  . Social connections    Talks on phone: More than three times a week    Gets together: More than three times a week    Attends religious service: More than 4 times per year    Active member of club or organization: Yes    Attends meetings of clubs or organizations: More than 4 times per year    Relationship status: Divorced  Other Topics Concern  . Not on file  Social History Narrative  . Not on file    Outpatient Encounter Medications as of 08/08/2019  Medication Sig  . albuterol (PROAIR HFA) 108 (90 Base) MCG/ACT inhaler INHALE 2 PUFFS INTO THE LUNCH EVERY 6 HOURS AS NEEDED FOR WHEEZING OR SHORTNESS OF BREATH  . amLODipine (NORVASC) 5 MG tablet Take 1 tablet (5 mg total) by mouth daily.  Marland Kitchen aspirin 81 MG tablet Take 81 mg by mouth daily.  Marland Kitchen b complex vitamins tablet Take 1 tablet by mouth daily.    Marland Kitchen CALCIUM PO Take by mouth daily.    . Cholecalciferol (VITAMIN D-3 PO) Take by mouth daily.    . clopidogrel (PLAVIX) 75 MG tablet Take 1 tablet (75 mg total) by mouth daily.  . hydrochlorothiazide (MICROZIDE) 12.5 MG capsule Take 1 capsule (12.5 mg total) by mouth daily.  Marland Kitchen levocetirizine (XYZAL) 5 MG tablet Take 1 tablet (5 mg total) by mouth every evening.  . magnesium oxide (MAG-OX) 400 MG tablet Take 400 mg by mouth daily.  . montelukast (SINGULAIR) 10 MG tablet Take 1 tablet (10 mg total) by mouth at bedtime.  Marland Kitchen oxybutynin (DITROPAN-XL) 10 MG 24 hr tablet TAKE 1 TABLET BY MOUTH Q DAILY  . potassium chloride (K-DUR) 10 MEQ tablet Take 1 tablet (10 mEq total) by mouth daily.  . simvastatin (ZOCOR) 10 MG tablet TAKE 1 TABLET EVERY DAY  AT  6  PM  . SYMBICORT 160-4.5 MCG/ACT inhaler INHALE 2 PUFFS TWICE DAILY  . Azelastine-Fluticasone (DYMISTA) 137-50 MCG/ACT SUSP Place 1 puff into the nose 2 (two)  times daily. (Patient not taking: Reported on 08/08/2019)  . [DISCONTINUED] terconazole (TERAZOL 3) 80 MG vaginal suppository Place 1 suppository (80 mg total) vaginally at bedtime. (Patient not taking: Reported on 08/08/2019)   No facility-administered encounter medications on file as of 08/08/2019.     Activities of Daily Living In your present state of health,  do you have any difficulty performing the following activities: 08/08/2019  Hearing? N  Vision? N  Difficulty concentrating or making decisions? N  Walking or climbing stairs? N  Dressing or bathing? N  Doing errands, shopping? N  Preparing Food and eating ? N  Using the Toilet? N  In the past six months, have you accidently leaked urine? Y  Do you have problems with loss of bowel control? N  Managing your Medications? N  Managing your Finances? N  Housekeeping or managing your Housekeeping? N  Some recent data might be hidden    Patient Care Team: Biagio Borg, MD as PCP - General (Internal Medicine) Christian Mate, MD (Orthopedic Surgery) Bjorn Loser, MD as Consulting Physician (Urology)    Assessment:   This is a routine wellness examination for Maria [name]. Physical assessment deferred to PCP.   Exercise Activities and Dietary recommendations Current Exercise Habits: Home exercise routine, Type of exercise: walking(goes hiking with hike club), Time (Minutes): 50, Frequency (Times/Week): 2, Weekly Exercise (Minutes/Week): 100  Diet (meal preparation, eat out, water intake, caffeinated beverages, dairy products, fruits and vegetables): in general, a "healthy" diet     Reviewed heart healthy diet. Encouraged patient to maintain daily water and healthy fluid intake.  Goals    . lose 40 pounds     Physically engage like going to the gym 3 days a week and walk 2 days a week, plan and prepare my meals       Fall Risk Fall Risk  08/08/2019 05/30/2018 05/29/2017 12/23/2016 12/16/2015  Falls in the past year? 0 No No  No Yes  Number falls in past yr: 0 - - - 1  Injury with Fall? - - - - No  Comment - - - - -    Depression Screen PHQ 2/9 Scores 08/08/2019 05/30/2018 05/29/2017 12/29/2016  PHQ - 2 Score 0 1 0 0  PHQ- 9 Score - 3 - -     Cognitive Function     6CIT Screen 08/08/2019  What Year? 0 points  What month? 0 points  What time? 0 points  Count back from 20 0 points  Months in reverse 0 points  Repeat phrase 0 points  Total Score 0    Immunization History  Administered Date(s) Administered  . Influenza Split 10/22/2012, 08/25/2013  . Influenza Whole 08/18/2010  . Influenza, High Dose Seasonal PF 12/16/2015, 12/01/2016, 10/30/2017  . Influenza,inj,Quad PF,6+ Mos 01/05/2015  . Pneumococcal Conjugate-13 10/23/2013  . Pneumococcal Polysaccharide-23 08/18/2008  . Td 11/10/2014   Screening Tests Health Maintenance  Topic Date Due  . URINE MICROALBUMIN  11/06/1950  . COLONOSCOPY  11/20/2018  . INFLUENZA VACCINE  07/19/2019  . TETANUS/TDAP  11/10/2024  . DEXA SCAN  Completed  . PNA vac Low Risk Adult  Completed      Plan:     Reviewed health maintenance screenings with patient today and relevant education, vaccines, and/or referrals were provided.   Continue to eat heart healthy diet (full of fruits, vegetables, whole grains, lean protein, water--limit salt, fat, and sugar intake) and increase physical activity as tolerated.  Continue doing brain stimulating activities (puzzles, reading, adult coloring books, staying active) to keep memory sharp.   I have personally reviewed and noted the following in the patient's chart:   . Medical and social history . Use of alcohol, tobacco or illicit drugs  . Current medications and supplements . Functional ability and status . Nutritional status . Physical activity . Advanced directives .  List of other physicians . Screenings to include cognitive, depression, and falls . Referrals and appointments  In addition, I have reviewed and  discussed with patient certain preventive protocols, quality metrics, and best practice recommendations. A written personalized care plan for preventive services as well as general preventive health recommendations were provided to patient.     Michiel Cowboy, RN  08/08/2019    Medical screening examination/treatment/procedure(s) were performed by non-physician practitioner and as supervising physician I was immediately available for consultation/collaboration. I agree with above. Cathlean Cower, MD

## 2019-08-08 ENCOUNTER — Ambulatory Visit (INDEPENDENT_AMBULATORY_CARE_PROVIDER_SITE_OTHER): Payer: Medicare HMO | Admitting: *Deleted

## 2019-08-08 DIAGNOSIS — Z0000 Encounter for general adult medical examination without abnormal findings: Secondary | ICD-10-CM

## 2019-08-13 ENCOUNTER — Encounter: Payer: Self-pay | Admitting: Internal Medicine

## 2019-08-13 ENCOUNTER — Ambulatory Visit (INDEPENDENT_AMBULATORY_CARE_PROVIDER_SITE_OTHER): Payer: Medicare HMO | Admitting: Internal Medicine

## 2019-08-13 ENCOUNTER — Other Ambulatory Visit: Payer: Self-pay

## 2019-08-13 VITALS — Ht 62.0 in | Wt 170.0 lb

## 2019-08-13 DIAGNOSIS — Z7902 Long term (current) use of antithrombotics/antiplatelets: Secondary | ICD-10-CM | POA: Diagnosis not present

## 2019-08-13 DIAGNOSIS — R1310 Dysphagia, unspecified: Secondary | ICD-10-CM

## 2019-08-13 DIAGNOSIS — R1319 Other dysphagia: Secondary | ICD-10-CM

## 2019-08-13 DIAGNOSIS — Z86010 Personal history of colonic polyps: Secondary | ICD-10-CM | POA: Diagnosis not present

## 2019-08-13 DIAGNOSIS — K625 Hemorrhage of anus and rectum: Secondary | ICD-10-CM

## 2019-08-13 NOTE — Progress Notes (Signed)
TELEHEALTH ENCOUNTER IN SETTING OF COVID-19 PANDEMIC - REQUESTED BY PATIENT SERVICE PROVIDED BY TELEMEDECINE - TYPE: Audio visual and audio (combined - connection issues w/ AV and had to change) PATIENT LOCATION: home PATIENT HAS CONSENTED TO TELEHEALTH VISIT PROVIDER LOCATION: OFFICE REFERRING PROVIDER Biagio Borg, MD  PARTICIPANTS OTHER THAN PATIENT:none TIME SPENT ON CALL:17 mins 22 sec    Maria [name] 78 y.o. 24-Oct-1941 [id number]  Assessment & Plan:   Encounter Diagnoses  Name Primary?  . Esophageal dysphagia Yes  . History of adenomatous polyp of colon   . Rectal bleeding   . Long term current use of antithrombotics/antiplatelets - clopidogrel - prior TIA    Evaluate and treat dysphagia with EGD, likely esophageal dilation Evaluate rectal bleeding and hx polyps with colonoscopy  The risks and benefits as well as alternatives of endoscopic procedure(s) have been discussed and reviewed. All questions answered. The patient agrees to proceed.  Hold clopidogrel 5 d before procedures.  Rare but real increased risk of stroke off clopidogrel explained - but want to reduce chance of GI hemorrhage from procedures. She understands and accepts    Subjective:   Chief Complaint: dysphagia, hx colon polyps  HPI  78 yo woman w/ PMH GERD abd colon polyps + multiple other medical problems.   Her c/o today are a remote hx of anal pain and some scanty rectal bleeding w/ bright red blood. She is also having intermittent but fairly frequent dysphagia with meat and bread but no unintentional weight loss. Pill dysphagia also, soft gel caps use has helped this. She also  does have bad burning indigestion and reflux  She stopped PPI because "I couldn't get it in the pandemic" Using natural product  - aloe vera juice - and it helps but not 100% She also has a chronic cough and is followed by pulmonary.  Wt Readings from Last 3 Encounters:  08/13/19 170 lb (77.1 kg)   01/09/19 165 lb (74.8 kg)  08/09/18 159 lb (72.1 kg)   Colonoscopy 2011 and 2014 both with diverticulosis and polyps  08/2010 - 3 adenomas max 8 mm 11/20/2013 2 diminutive polyps removed - one adenoma one lymphoid tissue   EGD 2014 - ring-like stricture and hiatal hernia, dilated to 54 Fr  Allergies  Allergen Reactions  . Amoxicillin Hives  . Ace Inhibitors Other (See Comments)    unknown  . Bee Venom   . Benicar [Olmesartan Medoxomil] Other (See Comments)    Tongue swelling  . Codeine     REACTION: nausea  . Lisinopril Other (See Comments)    unknown  . Macrobid [Nitrofurantoin Monohyd Macro]     Pulmonary Fibrosis  . Nutritional Supplements     Bee stings cause angioedema-carry an EPIPEN   Current Meds  Medication Sig  . albuterol (PROAIR HFA) 108 (90 Base) MCG/ACT inhaler INHALE 2 PUFFS INTO THE LUNCH EVERY 6 HOURS AS NEEDED FOR WHEEZING OR SHORTNESS OF BREATH  . amLODipine (NORVASC) 5 MG tablet Take 1 tablet (5 mg total) by mouth daily.  Marland Kitchen b complex vitamins tablet Take 1 tablet by mouth daily.    Marland Kitchen CALCIUM PO Take by mouth daily.    . Cholecalciferol (VITAMIN D-3 PO) Take by mouth daily.    . clopidogrel (PLAVIX) 75 MG tablet Take 1 tablet (75 mg total) by mouth daily.  . hydrochlorothiazide (MICROZIDE) 12.5 MG capsule Take 1 capsule (12.5 mg total) by mouth daily.  Marland Kitchen levocetirizine (XYZAL) 5 MG tablet Take 1  tablet (5 mg total) by mouth every evening.  . magnesium oxide (MAG-OX) 400 MG tablet Take 400 mg by mouth daily.  . montelukast (SINGULAIR) 10 MG tablet Take 1 tablet (10 mg total) by mouth at bedtime.  Marland Kitchen oxybutynin (DITROPAN-XL) 10 MG 24 hr tablet TAKE 1 TABLET BY MOUTH Q DAILY  . potassium chloride (K-DUR) 10 MEQ tablet Take 1 tablet (10 mEq total) by mouth daily.  . simvastatin (ZOCOR) 10 MG tablet TAKE 1 TABLET EVERY DAY  AT  6  PM  . SYMBICORT 160-4.5 MCG/ACT inhaler INHALE 2 PUFFS TWICE DAILY   Past Medical History:  Diagnosis Date  . Anemia   .  Arthritis   . Bronchitis   . Carpal tunnel syndrome    in calves neuropathy  . Diverticulosis   . GERD (gastroesophageal reflux disease)   . Headache(784.0)    chronic  . High cholesterol   . Hyperlipidemia   . Hypertension   . Migraine   . Nephritis   . OSA (obstructive sleep apnea)   . Palpitations   . Personal history of colonic adenomas 09/15/2010  . TIA (transient ischemic attack)   . Vertigo, benign paroxysmal    Past Surgical History:  Procedure Laterality Date  . BLADDER REPAIR    . BREAST BIOPSY     rt benign cyst  . CARPAL TUNNEL RELEASE     x 2  . COLONOSCOPY    . REPLACEMENT TOTAL KNEE BILATERAL    . TONSILLECTOMY    . UPPER GASTROINTESTINAL ENDOSCOPY     Social History   Social History Narrative   Retired, divorced 3 kids   Former smoker   Occ EtOH   No drugs   family history includes Aneurysm in her father; Chronic bronchitis in her mother; Colon cancer in her maternal grandmother; Colon polyps in her mother; Diabetes in her brother and father; Emphysema in her maternal grandfather; Heart disease in her mother; Hypertension in her father; Irritable bowel syndrome in her daughter; Kidney disease in her father.   Review of Systems As per HPI No cardiac or pulmonary sxs that are new/unstable All other ROS negatiove   AV view used to perform limited Physical exam PE - NAD Eyes anicteric Comfortable breathing clear speech Appropriate mood/affect

## 2019-08-13 NOTE — Patient Instructions (Addendum)
As we discussed today you will undergo upper GI endoscopy with esophageal dilation (likely) and a colonoscopy.  You will need to hold your clopidogrel (Plavix) 5 days before as in the past and we will clarify that with Dr. Jenny Reichmann.  I appreciate the opportunity to care for you. Gatha Mayer, MD, [facility]    I have spoken to the patient and mailed her the instructions (they will go out 08/15/2019). Anti-coag letter sent to Dr Jenny Reichmann and I will notify her when I hear back.

## 2019-08-14 ENCOUNTER — Telehealth: Payer: Self-pay

## 2019-08-14 NOTE — Telephone Encounter (Signed)
  08/14/2019   RE: Maria [name] DOB: Jun 08, 1941 MRN: [id number]   Dear Dr Cathlean Cower,    We have scheduled the above patient for an endoscopic procedure. Our records show that she is on anticoagulation therapy.   Please advise as to how long the patient may come off her therapy of Plavix prior to the procedure, which is scheduled for 08/28/2019.  Please fax back/ or route the completed form to Onnie Hatchel [place], [place] at [phone].   Sincerely,    Dr Silvano Rusk

## 2019-08-15 NOTE — Telephone Encounter (Addendum)
Patient informed and verbalized understanding that Dr Cathlean Cower approved holding her Plavix for 5 days. Have sent the fax to be scanned into epic.

## 2019-08-17 ENCOUNTER — Encounter: Payer: Self-pay | Admitting: Internal Medicine

## 2019-08-23 DIAGNOSIS — Z23 Encounter for immunization: Secondary | ICD-10-CM | POA: Diagnosis not present

## 2019-08-27 ENCOUNTER — Telehealth: Payer: Self-pay

## 2019-08-27 NOTE — Telephone Encounter (Signed)
Covid-19 screening questions   Do you now or have you had a fever in the last 14 days? NO   Do you have any respiratory symptoms of shortness of breath or cough now or in the last 14 days? NO, just a normal cough d/t reflux.    Do you have any family members or close contacts with diagnosed or suspected Covid-19 in the past 14 days? NO   Have you been tested for Covid-19 and found to be positive? NO

## 2019-08-28 ENCOUNTER — Other Ambulatory Visit: Payer: Self-pay

## 2019-08-28 ENCOUNTER — Ambulatory Visit (AMBULATORY_SURGERY_CENTER): Payer: Medicare HMO | Admitting: Internal Medicine

## 2019-08-28 ENCOUNTER — Encounter: Payer: Self-pay | Admitting: Internal Medicine

## 2019-08-28 VITALS — BP 151/73 | HR 66 | Temp 98.1°F | Resp 18 | Ht 62.0 in | Wt 170.0 lb

## 2019-08-28 DIAGNOSIS — R1310 Dysphagia, unspecified: Secondary | ICD-10-CM

## 2019-08-28 DIAGNOSIS — K2100 Gastro-esophageal reflux disease with esophagitis, without bleeding: Secondary | ICD-10-CM

## 2019-08-28 DIAGNOSIS — D123 Benign neoplasm of transverse colon: Secondary | ICD-10-CM

## 2019-08-28 DIAGNOSIS — K625 Hemorrhage of anus and rectum: Secondary | ICD-10-CM

## 2019-08-28 DIAGNOSIS — K5730 Diverticulosis of large intestine without perforation or abscess without bleeding: Secondary | ICD-10-CM

## 2019-08-28 DIAGNOSIS — K219 Gastro-esophageal reflux disease without esophagitis: Secondary | ICD-10-CM | POA: Diagnosis not present

## 2019-08-28 DIAGNOSIS — Z1211 Encounter for screening for malignant neoplasm of colon: Secondary | ICD-10-CM | POA: Diagnosis not present

## 2019-08-28 DIAGNOSIS — K222 Esophageal obstruction: Secondary | ICD-10-CM

## 2019-08-28 DIAGNOSIS — Z86010 Personal history of colonic polyps: Secondary | ICD-10-CM

## 2019-08-28 DIAGNOSIS — K259 Gastric ulcer, unspecified as acute or chronic, without hemorrhage or perforation: Secondary | ICD-10-CM | POA: Diagnosis not present

## 2019-08-28 DIAGNOSIS — D122 Benign neoplasm of ascending colon: Secondary | ICD-10-CM | POA: Diagnosis not present

## 2019-08-28 DIAGNOSIS — R1319 Other dysphagia: Secondary | ICD-10-CM

## 2019-08-28 MED ORDER — PANTOPRAZOLE SODIUM 40 MG PO TBEC: 40.0000 mg | DELAYED_RELEASE_TABLET | Freq: Every day | ORAL | 0 refills | Status: DC

## 2019-08-28 MED ORDER — SODIUM CHLORIDE 0.9 % IV SOLN: 500.0000 mL | Freq: Once | INTRAVENOUS | Status: DC

## 2019-08-28 MED ORDER — PANTOPRAZOLE SODIUM 40 MG PO TBEC: 40.0000 mg | DELAYED_RELEASE_TABLET | Freq: Every day | ORAL | 3 refills | Status: DC

## 2019-08-28 NOTE — Progress Notes (Signed)
A and O x3. Report to RN. Tolerated MAC anesthesia well.Teeth unchanged after procedure.

## 2019-08-28 NOTE — Progress Notes (Signed)
Pt's states no medical or surgical changes since previsit or office visit. 

## 2019-08-28 NOTE — Progress Notes (Signed)
Called to room to assist during endoscopic procedure.  Patient ID and intended procedure confirmed with present staff. Received instructions for my participation in the procedure from the performing physician.  

## 2019-08-28 NOTE — Op Note (Signed)
[place] Patient Name: Maria [name] Procedure Date: 08/28/2019 1:36 PM MRN: [id number] Endoscopist: Gatha Mayer , MD Age: 78 Referring MD:  Date of Birth: 15-Jul-1941 Gender: Female Account #: [id number] Procedure:                Colonoscopy Indications:              Rectal bleeding, Personal history of colonic polyps Medicines:                Propofol per Anesthesia, Monitored Anesthesia Care Procedure:                Pre-Anesthesia Assessment:                           - Prior to the procedure, a History and Physical                            was performed, and patient medications and                            allergies were reviewed. The patient's tolerance of                            previous anesthesia was also reviewed. The risks                            and benefits of the procedure and the sedation                            options and risks were discussed with the patient.                            All questions were answered, and informed consent                            was obtained. Prior Anticoagulants: The patient                            last took Plavix (clopidogrel) 5 days prior to the                            procedure. ASA Grade Assessment: II - A patient                            with mild systemic disease. After reviewing the                            risks and benefits, the patient was deemed in                            satisfactory condition to undergo the procedure.                           After obtaining informed consent, the colonoscope  was passed under direct vision. Throughout the                            procedure, the patient's blood pressure, pulse, and                            oxygen saturations were monitored continuously. The                            Colonoscope was introduced through the anus and                            advanced to the the cecum, identified by            appendiceal orifice and ileocecal valve. The                            colonoscopy was somewhat difficult due to                            significant looping. Successful completion of the                            procedure was aided by applying abdominal pressure.                            The patient tolerated the procedure well. The                            quality of the bowel preparation was good. The                            bowel preparation used was Miralax via split dose                            instruction. The ileocecal valve, appendiceal                            orifice, and rectum were photographed. Scope In: 1:56:18 PM Scope Out: 2:18:23 PM Scope Withdrawal Time: 0 hours 16 minutes 12 seconds  Total Procedure Duration: 0 hours 22 minutes 5 seconds  Findings:                 The perianal examination was normal.                           The digital rectal exam findings include decreased                            sphincter tone.                           Five sessile polyps were found in the transverse  colon and ascending colon. The polyps were 2 to 8                            mm in size. These polyps were removed with a cold                            snare. Resection and retrieval were complete.                            Verification of patient identification for the                            specimen was done. Estimated blood loss was minimal.                           Multiple diverticula were found in the left colon.                           The exam was otherwise without abnormality on                            direct and retroflexion views. Due to decreased                            sphincter tone difficult to see anorectal area in                            retroflex but on pull through ok Complications:            No immediate complications. Estimated Blood Loss:     Estimated blood loss was minimal. Impression:                - Decreased sphincter tone found on digital rectal                            exam.                           - Five 2 to 8 mm polyps in the transverse colon and                            in the ascending colon, removed with a cold snare.                            Resected and retrieved.                           - Diverticulosis in the left colon.                           - The examination was otherwise normal on direct                            and retroflexion views. Recommendation:           -  Patient has a contact number available for                            emergencies. The signs and symptoms of potential                            delayed complications were discussed with the                            patient. Return to normal activities tomorrow.                            Written discharge instructions were provided to the                            patient.                           - Resume previous diet.                           - Continue present medications.                           - Resume Plavix (clopidogrel) at prior dose                            tomorrow.                           - No repeat colonoscopy due to age. Gatha Mayer, MD 08/28/2019 2:38:13 PM This report has been signed electronically.

## 2019-08-28 NOTE — Progress Notes (Signed)
Dr. Carlean Purl has ordered Protonix 40 mg po qd to pt's mail order pharmacy. Pt. States her meds usually take around 10 days to arrive from there and requests we call enough Protonix in to [facility] on [address] to get her by until then. Dr. Carlean Purl has approved and Protonix 26m #15 tablets sent to [facility].

## 2019-08-28 NOTE — Op Note (Addendum)
[place] Patient Name: Maria [name] Procedure Date: 08/28/2019 1:36 PM MRN: [id number] Endoscopist: Gatha Mayer , MD Age: 78 Referring MD:  Date of Birth: 12-29-1940 Gender: Female Account #: [id number] Procedure:                Upper GI endoscopy Indications:              Dysphagia Medicines:                Propofol per Anesthesia, Monitored Anesthesia Care Procedure:                Pre-Anesthesia Assessment:                           - Prior to the procedure, a History and Physical                            was performed, and patient medications and                            allergies were reviewed. The patient's tolerance of                            previous anesthesia was also reviewed. The risks                            and benefits of the procedure and the sedation                            options and risks were discussed with the patient.                            All questions were answered, and informed consent                            was obtained. Prior Anticoagulants: The patient                            last took Plavix (clopidogrel) 5 days prior to the                            procedure. ASA Grade Assessment: II - A patient                            with mild systemic disease. After reviewing the                            risks and benefits, the patient was deemed in                            satisfactory condition to undergo the procedure.                           After obtaining informed consent, the endoscope was  passed under direct vision. Throughout the                            procedure, the patient's blood pressure, pulse, and                            oxygen saturations were monitored continuously. The                            Endoscope was introduced through the mouth, and                            advanced to the second part of duodenum. The upper                            GI endoscopy was  accomplished without difficulty.                            The patient tolerated the procedure well. Scope In: Scope Out: Findings:                 LA Grade B (one or more mucosal breaks greater than                            5 mm, not extending between the tops of two mucosal                            folds) esophagitis with no bleeding was found in                            the distal esophagus. Biopsies were taken with a                            cold forceps for histology. Verification of patient                            identification for the specimen was done. Estimated                            blood loss was minimal.                           One benign-appearing, intrinsic moderate stenosis                            was found at the gastroesophageal junction. This                            stenosis measured 1.6 cm (inner diameter). The                            stenosis was traversed. A TTS dilator was passed  through the scope. Dilation with a 16-17-18 mm                            balloon dilator was performed to 18 mm. The                            dilation site was examined and showed mild mucosal                            disruption. Estimated blood loss was minimal.                           A 4 cm hiatal hernia was present.                           The exam was otherwise without abnormality.                           The cardia and gastric fundus were normal on                            retroflexion. Complications:            No immediate complications. Estimated Blood Loss:     Estimated blood loss was minimal. Impression:               - LA Grade B reflux esophagitis. Biopsied.                           - Benign-appearing esophageal stenosis. Dilated.                           - 4 cm hiatal hernia.                           - The examination was otherwise normal. Recommendation:           - Patient has a contact number available  for                            emergencies. The signs and symptoms of potential                            delayed complications were discussed with the                            patient. Return to normal activities tomorrow.                            Written discharge instructions were provided to the                            patient.                           - Clear liquids x 1 hour then soft foods rest of  day. Start prior diet tomorrow.                           - Continue present medications.                           - Await pathology results.                           - Follow an antireflux regimen.                           - Use Protonix (pantoprazole) 40 mg PO daily.                           Colonoscopy next                           - Resume Plavix (clopidogrel) at prior dose                            tomorrow. [Management]. Gatha Mayer, MD 08/28/2019 2:33:12 PM This report has been signed electronically.

## 2019-08-28 NOTE — Patient Instructions (Addendum)
There is inflammation and scar tissue at the end of the esophagus. o took biopsies and dilated it so you will swallow better.  I sent a prescription to your mail order pharmacy for pantoprazole which will treat the acid reflux and heal things up.  You should stay on this to reduce risk of recurrent problems.  I also found and removed 4 colon polyps, tiny to small in size.  I will let you know the results but do not think you will need a repeat colonoscopy.  I suspect you had some bleeding from hemorrhoids.   Nothing bad going on.  RESTART PLAVIX (CLIOPIDOGREL) TOMORROW 9/11   Follow an antireflux regimen.  This includes:      - Do not lie down for at least 3 to 4 hours after meals.       - Raise the head of the bed 4 to 6 inches.       - Decrease excess weight.       - Avoid citrus juices and other acidic foods, alcohol, chocolate, mints, coffee and other caffeinated beverages, carbonated beverages, fatty and fried foods.       - Avoid tight-fitting clothing.       - Avoid cigarettes and other tobacco products.   I appreciate the opportunity to care for you. Gatha Mayer, MD, FACG   YOU HAD AN ENDOSCOPIC PROCEDURE TODAY AT [facility]:   Refer to the procedure report that was given to you for any specific questions about what was found during the examination.  If the procedure report does not answer your questions, please call your gastroenterologist to clarify.  If you requested that your care partner not be given the details of your procedure findings, then the procedure report has been included in a sealed envelope for you to review at your convenience later.  YOU SHOULD EXPECT: Some feelings of bloating in the abdomen. Passage of more gas than usual.  Walking can help get rid of the air that was put into your GI tract during the procedure and reduce the bloating. If you had a lower endoscopy (such as a colonoscopy or flexible sigmoidoscopy) you may notice  spotting of blood in your stool or on the toilet paper. If you underwent a bowel prep for your procedure, you may not have a normal bowel movement for a few days.  Please Note:  You might notice some irritation and congestion in your nose or some drainage.  This is from the oxygen used during your procedure.  There is no need for concern and it should clear up in a day or so.  SYMPTOMS TO REPORT IMMEDIATELY:   Following lower endoscopy (colonoscopy or flexible sigmoidoscopy):  Excessive amounts of blood in the stool  Significant tenderness or worsening of abdominal pains  Swelling of the abdomen that is new, acute  Fever of 100F or higher   Following upper endoscopy (EGD)  Vomiting of blood or coffee ground material  New chest pain or pain under the shoulder blades  Painful or persistently difficult swallowing  New shortness of breath  Fever of 100F or higher  Black, tarry-looking stools  For urgent or emergent issues, a gastroenterologist can be reached at any hour by calling [phone].   DIET:  Nothing by mouth until 3:30pm today, then clear liquids only for 1 hour from 3:30pm to 4:30pm, then follow a soft diet for the rest of the day today starting at 4:30 pm (see handout given  to you by your recovery nurse) and then you may proceed to your regular diet as tolerated tomorrow.  Drink plenty of fluids but you should avoid alcoholic beverages for 24 hours.  MEDICATIONS: Continue present medications. Use Protonix (pantoprazole) 40 mg by mouth daily. Resume Plavix (clopidogrel) at prior dose tomorrow.  Please see handouts given to you by your recovery nurse.  ACTIVITY:  You should plan to take it easy for the rest of today and you should NOT DRIVE or use heavy machinery until tomorrow (because of the sedation medicines used during the test).    FOLLOW UP: Our staff will call the number listed on your records 48-72 hours following your procedure to check on you and address any  questions or concerns that you may have regarding the information given to you following your procedure. If we do not reach you, we will leave a message.  We will attempt to reach you two times.  During this call, we will ask if you have developed any symptoms of COVID 19. If you develop any symptoms (ie: fever, flu-like symptoms, shortness of breath, cough etc.) before then, please call [phone].  If you test positive for Covid 19 in the 2 weeks post procedure, please call and report this information to [place].    If any biopsies were taken you will be contacted by phone or by letter within the next 1-3 weeks.  Please call us at [phone] if you have not heard about the biopsies in 3 weeks.   Thank you for allowing [place] to provide for your healthcare needs today.   SIGNATURES/CONFIDENTIALITY: You and/or your care partner have signed paperwork which will be entered into your electronic medical record.  These signatures attest to the fact that that the information above on your After Visit Summary has been reviewed and is understood.  Full responsibility of the confidentiality of this discharge information lies with you and/or your care-partner.

## 2019-09-01 ENCOUNTER — Telehealth: Payer: Self-pay

## 2019-09-01 NOTE — Telephone Encounter (Signed)
Left message on follow up call.

## 2019-09-01 NOTE — Telephone Encounter (Signed)
  Follow up Call-  Call back number 08/28/2019  Post procedure Call Back phone  # [phone]  Permission to leave phone message Yes  Some recent data might be hidden     Patient questions:  Do you have a fever, pain , or abdominal swelling? No. Pain Score  0 *  Have you tolerated food without any problems? Yes.    Have you been able to return to your normal activities? Yes.    Do you have any questions about your discharge instructions: Diet   No. Medications  No. Follow up visit  No.  Do you have questions or concerns about your Care? No.  Actions: * If pain score is 4 or above: No action needed, pain <4. 1. Have you developed a fever since your procedure? no  2.   Have you had an respiratory symptoms (SOB or cough) since your procedure? no  3.   Have you tested positive for COVID 19 since your procedure no  4.   Have you had any family members/close contacts diagnosed with the COVID 19 since your procedure?  no   If yes to any of these questions please route to Joylene John, RN and Alphonsa Gin, Therapist, sports.

## 2019-09-03 NOTE — Telephone Encounter (Signed)
Marland Kitchen

## 2019-09-05 ENCOUNTER — Encounter: Payer: Self-pay | Admitting: Internal Medicine

## 2019-09-05 NOTE — Progress Notes (Signed)
4 adenomas no recall - age Reflux changes no recall My Chart letter

## 2019-09-08 ENCOUNTER — Other Ambulatory Visit: Payer: Self-pay | Admitting: Internal Medicine

## 2019-10-17 ENCOUNTER — Other Ambulatory Visit: Payer: Self-pay

## 2019-10-17 ENCOUNTER — Ambulatory Visit (INDEPENDENT_AMBULATORY_CARE_PROVIDER_SITE_OTHER): Payer: Medicare HMO | Admitting: Internal Medicine

## 2019-10-17 DIAGNOSIS — J209 Acute bronchitis, unspecified: Secondary | ICD-10-CM

## 2019-10-17 DIAGNOSIS — I10 Essential (primary) hypertension: Secondary | ICD-10-CM | POA: Diagnosis not present

## 2019-10-17 DIAGNOSIS — R7302 Impaired glucose tolerance (oral): Secondary | ICD-10-CM

## 2019-10-17 MED ORDER — DOXYCYCLINE HYCLATE 100 MG PO TABS: 100.0000 mg | ORAL_TABLET | Freq: Two times a day (BID) | ORAL | 0 refills | Status: DC

## 2019-10-17 MED ORDER — HYDROCODONE-HOMATROPINE 5-1.5 MG/5ML PO SYRP: 5.0000 mL | ORAL_SOLUTION | Freq: Four times a day (QID) | ORAL | 0 refills | Status: AC | PRN

## 2019-10-17 NOTE — Progress Notes (Signed)
Patient ID: Maria [name], female   DOB: [birth year], 78 y.o.   MRN: [id number]  Virtual Visit via Video Note  I connected with [name] on 10/17/19 at  2:00 PM EDT by a video enabled telemedicine application and verified that I am speaking with the correct person using two identifiers.  Location: Patient: at home Provider: at office   I discussed the limitations of evaluation and management by telemedicine and the availability of in person appointments. The patient expressed understanding and agreed to proceed.  History of Present Illness: Here with acute onset mild to mod 2-3 days ST, HA, general weakness and malaise, with prod cough greenish sputum, but Pt denies chest pain, increased sob or doe, wheezing, orthopnea, PND, increased LE swelling, palpitations, dizziness or syncope.  Pt denies new neurological symptoms such as new headache, or facial or extremity weakness or numbness   Pt denies polydipsia, polyuria,  Past Medical History:  Diagnosis Date  . Anemia   . Arthritis   . Bronchitis   . Carpal tunnel syndrome    in calves neuropathy  . Diverticulosis   . GERD (gastroesophageal reflux disease)   . Headache(784.0)    chronic  . High cholesterol   . Hyperlipidemia   . Hypertension   . Migraine   . Nephritis   . OSA (obstructive sleep apnea)   . Palpitations   . Personal history of colonic adenomas 09/15/2010  . TIA (transient ischemic attack)   . Vertigo, benign paroxysmal    Past Surgical History:  Procedure Laterality Date  . BLADDER REPAIR    . BREAST BIOPSY     rt benign cyst  . CARPAL TUNNEL RELEASE     x 2  . COLONOSCOPY    . REPLACEMENT TOTAL KNEE BILATERAL    . TONSILLECTOMY    . UPPER GASTROINTESTINAL ENDOSCOPY      reports that she quit smoking about 49 years ago. Her smoking use included cigarettes. She has a 10.00 pack-year smoking history. She has never used smokeless tobacco. She reports current alcohol use. She reports that she does not  use drugs. family history includes Aneurysm in her father; Chronic bronchitis in her mother; Colon cancer in her maternal grandmother; Colon polyps in her mother; Diabetes in her brother and father; Emphysema in her maternal grandfather; Heart disease in her mother; Hypertension in her father; Irritable bowel syndrome in her daughter; Kidney disease in her father. Allergies  Allergen Reactions  . Amoxicillin Hives  . Ace Inhibitors Other (See Comments)    unknown  . Bee Venom   . Benicar [Olmesartan Medoxomil] Other (See Comments)    Tongue swelling  . Codeine     REACTION: nausea  . Lisinopril Other (See Comments)    unknown  . Macrobid [Nitrofurantoin Monohyd Macro]     Pulmonary Fibrosis  . Nutritional Supplements     Bee stings cause angioedema-carry an EPIPEN   Current Outpatient Medications on File Prior to Visit  Medication Sig Dispense Refill  . albuterol (PROAIR HFA) 108 (90 Base) MCG/ACT inhaler INHALE 2 PUFFS INTO THE LUNCH EVERY 6 HOURS AS NEEDED FOR WHEEZING OR SHORTNESS OF BREATH 8.5 Inhaler 2  . amLODipine (NORVASC) 5 MG tablet Take 1 tablet (5 mg total) by mouth daily. 90 tablet 1  . b complex vitamins tablet Take 1 tablet by mouth daily.      Marland Kitchen CALCIUM PO Take by mouth daily.      . Cholecalciferol (VITAMIN D-3 PO) Take by  mouth daily.      . clopidogrel (PLAVIX) 75 MG tablet Take 1 tablet (75 mg total) by mouth daily. 90 tablet 1  . hydrochlorothiazide (MICROZIDE) 12.5 MG capsule Take 1 capsule (12.5 mg total) by mouth daily. 90 capsule 1  . levocetirizine (XYZAL) 5 MG tablet Take 1 tablet (5 mg total) by mouth every evening. 90 tablet 1  . magnesium oxide (MAG-OX) 400 MG tablet Take 400 mg by mouth daily.    . montelukast (SINGULAIR) 10 MG tablet Take 1 tablet (10 mg total) by mouth at bedtime. 90 tablet 1  . oxybutynin (DITROPAN-XL) 10 MG 24 hr tablet TAKE 1 TABLET BY MOUTH Q DAILY  11  . pantoprazole (PROTONIX) 40 MG tablet Take 1 tablet (40 mg total) by mouth  daily before breakfast. 90 tablet 3  . pantoprazole (PROTONIX) 40 MG tablet Take 1 tablet (40 mg total) by mouth daily for 15 days. 15 tablet 0  . potassium chloride (K-DUR) 10 MEQ tablet Take 1 tablet (10 mEq total) by mouth daily. 90 tablet 1  . simvastatin (ZOCOR) 10 MG tablet TAKE 1 TABLET EVERY DAY  AT  6  PM 90 tablet 1  . SYMBICORT 160-4.5 MCG/ACT inhaler INHALE 2 PUFFS TWICE DAILY 3 Inhaler 3   Current Facility-Administered Medications on File Prior to Visit  Medication Dose Route Frequency Provider Last Rate Last Dose  . 0.9 %  sodium chloride infusion  500 mL Intravenous Once Gatha Mayer, MD        Observations/Objective: Alert, NAD, appropriate mood and affect, resps normal, cn 2-12 intact, moves all 4s, no visible rash or swelling Lab Results  Component Value Date   WBC 6.2 02/18/2018   HGB 13.9 02/18/2018   HCT 41.8 02/18/2018   PLT 306.0 02/18/2018   GLUCOSE 91 02/18/2018   CHOL 137 12/01/2016   TRIG 52.0 12/01/2016   HDL 60.30 12/01/2016   LDLDIRECT 113.2 10/23/2013   LDLCALC 66 12/01/2016   ALT 20 02/18/2018   AST 22 02/18/2018   [name] 139 02/18/2018   K 3.7 02/18/2018   CL 104 02/18/2018   CREATININE 0.66 02/18/2018   BUN 11 02/18/2018   CO2 28 02/18/2018   TSH 1.57 02/18/2018   INR 0.96 01/31/2011   HGBA1C 5.9 02/18/2018   Assessment and Plan: See notes  Follow Up Instructions: See notes   I discussed the assessment and treatment plan with the patient. The patient was provided an opportunity to ask questions and all were answered. The patient agreed with the plan and demonstrated an understanding of the instructions.   The patient was advised to call back or seek an in-person evaluation if the symptoms worsen or if the condition fails to improve as anticipated.  Cathlean Cower, MD

## 2019-10-18 ENCOUNTER — Encounter: Payer: Self-pay | Admitting: Internal Medicine

## 2019-10-18 NOTE — Assessment & Plan Note (Signed)
stable overall by history and exam, recent data reviewed with pt, and pt to continue medical treatment as before,  to f/u any worsening symptoms or concerns  

## 2019-10-18 NOTE — Patient Instructions (Signed)
Please take all new medication as prescribed  Please continue all other medications as before, and refills have been done if requested.  Please have the pharmacy call with any other refills you may need.  Please continue your efforts at being more active, low cholesterol diet, and weight control.  Please keep your appointments with your specialists as you may have planned

## 2019-10-18 NOTE — Assessment & Plan Note (Signed)
To continue to monitor BP at home and next visit

## 2019-10-18 NOTE — Assessment & Plan Note (Signed)
Mild to mod, for antibx course,  to f/u any worsening symptoms or concerns 

## 2019-10-27 ENCOUNTER — Telehealth: Payer: Self-pay | Admitting: Internal Medicine

## 2019-10-27 ENCOUNTER — Other Ambulatory Visit (INDEPENDENT_AMBULATORY_CARE_PROVIDER_SITE_OTHER): Payer: Medicare HMO

## 2019-10-27 DIAGNOSIS — R300 Dysuria: Secondary | ICD-10-CM

## 2019-10-27 LAB — URINALYSIS, ROUTINE W REFLEX MICROSCOPIC
Bilirubin Urine: NEGATIVE
Ketones, ur: NEGATIVE
Nitrite: POSITIVE — AB
Specific Gravity, Urine: 1.015 (ref 1.000–1.030)
Total Protein, Urine: NEGATIVE
Urine Glucose: NEGATIVE
Urobilinogen, UA: 0.2 (ref 0.0–1.0)
pH: 6 (ref 5.0–8.0)

## 2019-10-27 MED ORDER — LEVOFLOXACIN 500 MG PO TABS: 500.0000 mg | ORAL_TABLET | Freq: Every day | ORAL | 0 refills | Status: AC

## 2019-10-27 MED ORDER — FLUCONAZOLE 150 MG PO TABS: ORAL_TABLET | ORAL | 1 refills | Status: DC

## 2019-10-27 NOTE — Telephone Encounter (Addendum)
Ok for [facility] [address], but as we do not normally provide antibiotics on demand without an evaluation of some kind as this is not good medical care (as other things can cause her symptoms0, please come to lab for UA and cx

## 2019-10-27 NOTE — Telephone Encounter (Signed)
Please advise if this can be handled without an appointment.  She was seen on 10/30.

## 2019-10-27 NOTE — Telephone Encounter (Signed)
Patient called to say that she have been having vaginitis and have frequency, urgency, burning and the pain upon voiding asking for something before the day is out. Very upset that she have not gotten a call back since  She called in at 9 AM. Please advise Ph# [phone]

## 2019-10-27 NOTE — Telephone Encounter (Signed)
levaquin done erx [facility] [address] - ok to let pt know

## 2019-10-27 NOTE — Telephone Encounter (Signed)
Patient called and would like a call back from Dr. Jenny Reichmann or his CMA about a possible UTI she thinks she has. She is currently on antibiotic and thinks it may be causing her UTI. Please call patient back, thanks.

## 2019-10-27 NOTE — Telephone Encounter (Signed)
Pt has been informed and will come by the lab today.

## 2019-10-27 NOTE — Addendum Note (Signed)
Addended by: Biagio Borg on: 10/27/2019 03:23 PM   Modules accepted: Orders

## 2019-10-28 NOTE — Telephone Encounter (Signed)
Pt has been informed.

## 2019-10-29 LAB — URINE CULTURE
MICRO NUMBER:: 1079052
SPECIMEN QUALITY:: ADEQUATE

## 2019-12-11 ENCOUNTER — Telehealth: Payer: Self-pay | Admitting: *Deleted

## 2019-12-11 ENCOUNTER — Ambulatory Visit: Payer: Self-pay

## 2019-12-11 ENCOUNTER — Other Ambulatory Visit: Payer: Self-pay

## 2019-12-11 ENCOUNTER — Other Ambulatory Visit: Payer: Self-pay | Admitting: *Deleted

## 2019-12-11 ENCOUNTER — Other Ambulatory Visit: Payer: Medicare HMO

## 2019-12-11 DIAGNOSIS — R300 Dysuria: Secondary | ICD-10-CM

## 2019-12-11 NOTE — Telephone Encounter (Signed)
Copied from [place] [id number]. Topic: General - Other >> Dec 11, 2019  8:52 AM Leward Quan A wrote: Reason for CRM: Patient called to say that she had vaginitis that was being treated and now she is having cloudy urine and need to come in and leave a urine sample so that she can get some medication to treat a UTI before it get any worst. Asking for a call back ASAP please Ph# [phone]

## 2019-12-11 NOTE — Telephone Encounter (Signed)
Notified pt w/MD ok order to have UA done. Have been place in system.Marland KitchenJohny Chess

## 2019-12-11 NOTE — Telephone Encounter (Signed)
Pt called in again requesting that Dr. Jenny Reichmann nurse returns her call. Pt stated she does not want the communication to be done through [facility]. Pt request call back.

## 2019-12-11 NOTE — Telephone Encounter (Signed)
Patient is calling to ask did the specimen get run. She is in pain. Please advise CB- [phone]

## 2019-12-11 NOTE — Telephone Encounter (Signed)
Patient is calling to let the Doctor know that she feels that that is unacceptable for her not to have her lab results back before the office called. Patient was extremely upset and yelled at the agent several times about her dissatisfaction that she called at [facility] and there where no answers for her at 4p. Patient expressed discontentment and that her UTI would have grown by Monday. Patient asked in emergency situations where would she be directed since the office was closed. Patient was advised the Urgent Care or the Emergency Room

## 2019-12-11 NOTE — Telephone Encounter (Signed)
Ok , orders done

## 2019-12-13 LAB — URINALYSIS, ROUTINE W REFLEX MICROSCOPIC
Bilirubin Urine: NEGATIVE
Glucose, UA: NEGATIVE
Hyaline Cast: NONE SEEN /LPF
Ketones, ur: NEGATIVE
Nitrite: POSITIVE — AB
Specific Gravity, Urine: 1.015 (ref 1.001–1.03)
WBC, UA: >=60 /HPF — AB (ref 0–5)
pH: 7.5 (ref 5.0–8.0)

## 2019-12-13 LAB — URINE CULTURE

## 2019-12-14 ENCOUNTER — Other Ambulatory Visit: Payer: Self-pay | Admitting: Internal Medicine

## 2019-12-14 MED ORDER — CIPROFLOXACIN HCL 500 MG PO TABS: 500.0000 mg | ORAL_TABLET | Freq: Two times a day (BID) | ORAL | 0 refills | Status: AC

## 2019-12-15 ENCOUNTER — Telehealth: Payer: Self-pay | Admitting: Internal Medicine

## 2019-12-15 NOTE — Progress Notes (Signed)
LVM-notified lab results sent to [facility] CHART-of any question please call the office back [phone].

## 2019-12-15 NOTE — Telephone Encounter (Signed)
Patient called stating that she was disconnected from getting urine results.  I have given patient Dr. Gwynn Burly response to recent labs.  Patient understood.

## 2019-12-16 NOTE — Telephone Encounter (Signed)
Notified pt about lab results with Dr. Jenny Reichmann instructions.

## 2019-12-25 ENCOUNTER — Telehealth: Payer: Self-pay | Admitting: Internal Medicine

## 2019-12-25 MED ORDER — SULFAMETHOXAZOLE-TRIMETHOPRIM 800-160 MG PO TABS: 1.0000 | ORAL_TABLET | Freq: Two times a day (BID) | ORAL | 0 refills | Status: DC

## 2019-12-25 NOTE — Telephone Encounter (Signed)
Called patient LVM for call back to discuss labs.

## 2019-12-25 NOTE — Telephone Encounter (Signed)
We received urine culture results just today [facility]  Urine culture + for E coli > 100K colonies, which is pansensitive  Pt allergic to amoxil and macrobid  Ok for Septra ds - done erx  Please let pt know - sent [facility] [address]

## 2019-12-25 NOTE — Telephone Encounter (Signed)
Patient called back. Informed patient of results and informed her that the [facility] [address].   Patient had no further questions

## 2020-01-07 ENCOUNTER — Other Ambulatory Visit: Payer: Self-pay | Admitting: Internal Medicine

## 2020-01-07 NOTE — Telephone Encounter (Signed)
Please refill as per office routine med refill policy (all routine meds refilled for 3 mo or monthly per pt preference up to one year from last visit, then month to month grace period for 3 mo, then further med refills will have to be denied)  

## 2020-01-09 ENCOUNTER — Other Ambulatory Visit: Payer: Self-pay | Admitting: Internal Medicine

## 2020-01-09 DIAGNOSIS — R1310 Dysphagia, unspecified: Secondary | ICD-10-CM

## 2020-01-09 DIAGNOSIS — R1319 Other dysphagia: Secondary | ICD-10-CM

## 2020-01-09 MED ORDER — HYDROCHLOROTHIAZIDE 12.5 MG PO CAPS: 12.5000 mg | ORAL_CAPSULE | Freq: Every day | ORAL | 0 refills | Status: DC

## 2020-01-09 MED ORDER — SIMVASTATIN 10 MG PO TABS: ORAL_TABLET | ORAL | 0 refills | Status: DC

## 2020-01-09 MED ORDER — POTASSIUM CHLORIDE ER 10 MEQ PO TBCR: 10.0000 meq | EXTENDED_RELEASE_TABLET | Freq: Every day | ORAL | 0 refills | Status: DC

## 2020-01-09 MED ORDER — OXYBUTYNIN CHLORIDE ER 10 MG PO TB24: ORAL_TABLET | ORAL | 0 refills | Status: DC

## 2020-01-09 MED ORDER — AMLODIPINE BESYLATE 5 MG PO TABS: 5.0000 mg | ORAL_TABLET | Freq: Every day | ORAL | 0 refills | Status: DC

## 2020-01-09 MED ORDER — LEVOCETIRIZINE DIHYDROCHLORIDE 5 MG PO TABS: 5.0000 mg | ORAL_TABLET | Freq: Every evening | ORAL | 0 refills | Status: DC

## 2020-01-09 MED ORDER — PANTOPRAZOLE SODIUM 40 MG PO TBEC: 40.0000 mg | DELAYED_RELEASE_TABLET | Freq: Every day | ORAL | 0 refills | Status: DC

## 2020-01-09 MED ORDER — MONTELUKAST SODIUM 10 MG PO TABS: 10.0000 mg | ORAL_TABLET | Freq: Every day | ORAL | 0 refills | Status: DC

## 2020-01-09 MED ORDER — CLOPIDOGREL BISULFATE 75 MG PO TABS: 75.0000 mg | ORAL_TABLET | Freq: Every day | ORAL | 0 refills | Status: DC

## 2020-01-09 NOTE — Telephone Encounter (Signed)
I have spoken to pt. She has been informed that only one 90-day refill will be sent to [facility] to hold her over until her OV in Feb. She expressed understanding.

## 2020-01-09 NOTE — Telephone Encounter (Signed)
   Patient calling upset, stating her medications have not been renewed because an appointment is needed. Scheduled virtual CPE, patient declined in office visit. Requesting assistant contact her to discuss med renewal

## 2020-01-20 ENCOUNTER — Other Ambulatory Visit: Payer: Self-pay | Admitting: Internal Medicine

## 2020-01-20 DIAGNOSIS — Z1231 Encounter for screening mammogram for malignant neoplasm of breast: Secondary | ICD-10-CM

## 2020-01-23 ENCOUNTER — Ambulatory Visit (INDEPENDENT_AMBULATORY_CARE_PROVIDER_SITE_OTHER): Payer: Medicare HMO | Admitting: Internal Medicine

## 2020-01-23 VITALS — BP 119/69

## 2020-01-23 DIAGNOSIS — R059 Cough, unspecified: Secondary | ICD-10-CM

## 2020-01-23 DIAGNOSIS — Z0000 Encounter for general adult medical examination without abnormal findings: Secondary | ICD-10-CM | POA: Diagnosis not present

## 2020-01-23 DIAGNOSIS — R1319 Other dysphagia: Secondary | ICD-10-CM

## 2020-01-23 DIAGNOSIS — R05 Cough: Secondary | ICD-10-CM | POA: Diagnosis not present

## 2020-01-23 DIAGNOSIS — N390 Urinary tract infection, site not specified: Secondary | ICD-10-CM | POA: Diagnosis not present

## 2020-01-23 DIAGNOSIS — E611 Iron deficiency: Secondary | ICD-10-CM

## 2020-01-23 DIAGNOSIS — E538 Deficiency of other specified B group vitamins: Secondary | ICD-10-CM | POA: Diagnosis not present

## 2020-01-23 DIAGNOSIS — R7302 Impaired glucose tolerance (oral): Secondary | ICD-10-CM | POA: Diagnosis not present

## 2020-01-23 DIAGNOSIS — R1310 Dysphagia, unspecified: Secondary | ICD-10-CM

## 2020-01-23 DIAGNOSIS — E559 Vitamin D deficiency, unspecified: Secondary | ICD-10-CM | POA: Diagnosis not present

## 2020-01-23 MED ORDER — PANTOPRAZOLE SODIUM 40 MG PO TBEC: 40.0000 mg | DELAYED_RELEASE_TABLET | Freq: Every day | ORAL | 3 refills | Status: DC

## 2020-01-23 MED ORDER — CLOPIDOGREL BISULFATE 75 MG PO TABS: 75.0000 mg | ORAL_TABLET | Freq: Every day | ORAL | 3 refills | Status: DC

## 2020-01-23 MED ORDER — SIMVASTATIN 10 MG PO TABS: ORAL_TABLET | ORAL | 3 refills | Status: DC

## 2020-01-23 MED ORDER — LEVOCETIRIZINE DIHYDROCHLORIDE 5 MG PO TABS: 5.0000 mg | ORAL_TABLET | Freq: Every evening | ORAL | 3 refills | Status: DC

## 2020-01-23 MED ORDER — POTASSIUM CHLORIDE ER 10 MEQ PO TBCR: 10.0000 meq | EXTENDED_RELEASE_TABLET | Freq: Every day | ORAL | 3 refills | Status: DC

## 2020-01-23 MED ORDER — HYDROCHLOROTHIAZIDE 12.5 MG PO CAPS: 12.5000 mg | ORAL_CAPSULE | Freq: Every day | ORAL | 3 refills | Status: DC

## 2020-01-23 MED ORDER — MONTELUKAST SODIUM 10 MG PO TABS: 10.0000 mg | ORAL_TABLET | Freq: Every day | ORAL | 3 refills | Status: DC

## 2020-01-23 MED ORDER — OXYBUTYNIN CHLORIDE ER 10 MG PO TB24: ORAL_TABLET | ORAL | 3 refills | Status: DC

## 2020-01-23 MED ORDER — ALBUTEROL SULFATE HFA 108 (90 BASE) MCG/ACT IN AERS: INHALATION_SPRAY | RESPIRATORY_TRACT | 2 refills | Status: DC

## 2020-01-23 MED ORDER — AMLODIPINE BESYLATE 5 MG PO TABS: 5.0000 mg | ORAL_TABLET | Freq: Every day | ORAL | 3 refills | Status: DC

## 2020-01-23 NOTE — Progress Notes (Signed)
Patient ID: Maria [name], female   DOB: May 04, 1941, 79 y.o.   MRN: [id number]  Virtual Visit via Video Note  I connected with Maria [name] on 01/23/20 at  1:40 PM EST by a video enabled telemedicine application and verified that I am speaking with the correct person using two identifiers.  Location: Patient: at home Provider: at office   I discussed the limitations of evaluation and management by telemedicine and the availability of in person appointments. The patient expressed understanding and agreed to proceed.  History of Present Illness: Here for wellness and f/u;  Overall doing ok;  Pt denies Chest pain, worsening SOB, DOE, wheezing, orthopnea, PND, worsening LE edema, palpitations, dizziness or syncope.  Pt denies neurological change such as new headache, facial or extremity weakness.  Pt denies polydipsia, polyuria, or low sugar symptoms. Pt states overall good compliance with treatment and medications, good tolerability, and has been trying to follow appropriate diet.  Pt denies worsening depressive symptoms, suicidal ideation or panic. No fever, night sweats, wt loss, loss of appetite, or other constitutional symptoms.  Pt states good ability with ADL's, has low fall risk, home safety reviewed and adequate, no other significant changes in hearing or vision, and only occasionally active with exercise. No new complaints Past Medical History:  Diagnosis Date  . Anemia   . Arthritis   . Bronchitis   . Carpal tunnel syndrome    in calves neuropathy  . Diverticulosis   . GERD (gastroesophageal reflux disease)   . Headache(784.0)    chronic  . High cholesterol   . Hyperlipidemia   . Hypertension   . Migraine   . Nephritis   . OSA (obstructive sleep apnea)   . Palpitations   . Personal history of colonic adenomas 09/15/2010  . TIA (transient ischemic attack)   . Vertigo, benign paroxysmal    Past Surgical History:  Procedure Laterality Date  . BLADDER REPAIR    . BREAST  BIOPSY     rt benign cyst  . CARPAL TUNNEL RELEASE     x 2  . COLONOSCOPY    . REPLACEMENT TOTAL KNEE BILATERAL    . TONSILLECTOMY    . UPPER GASTROINTESTINAL ENDOSCOPY      reports that she quit smoking about 50 years ago. Her smoking use included cigarettes. She has a 10.00 pack-year smoking history. She has never used smokeless tobacco. She reports current alcohol use. She reports that she does not use drugs. family history includes Aneurysm in her father; Chronic bronchitis in her mother; Colon cancer in her maternal grandmother; Colon polyps in her mother; Diabetes in her brother and father; Emphysema in her maternal grandfather; Heart disease in her mother; Hypertension in her father; Irritable bowel syndrome in her daughter; Kidney disease in her father. Allergies  Allergen Reactions  . Amoxicillin Hives  . Ace Inhibitors Other (See Comments)    unknown  . Bee Venom   . Benicar [Olmesartan Medoxomil] Other (See Comments)    Tongue swelling  . Codeine     REACTION: nausea  . Lisinopril Other (See Comments)    unknown  . Macrobid [Nitrofurantoin Monohyd Macro]     Pulmonary Fibrosis  . Nutritional Supplements     Bee stings cause angioedema-carry an EPIPEN  . Shingrix [Zoster Vac Recomb Adjuvanted] Other (See Comments)    Chills, feverish, nausea x 4 days   Current Outpatient Medications on File Prior to Visit  Medication Sig Dispense Refill  . b complex vitamins  tablet Take 1 tablet by mouth daily.      Marland Kitchen CALCIUM PO Take by mouth daily.      . Cholecalciferol (VITAMIN D-3 PO) Take by mouth daily.       No current facility-administered medications on file prior to visit.   Observations/Objective: Alert, NAD, appropriate mood and affect, resps normal, cn 2-12 intact, moves all 4s, no visible rash or swelling  Lab Results  Component Value Date   WBC 6.2 02/18/2018   HGB 13.9 02/18/2018   HCT 41.8 02/18/2018   PLT 306.0 02/18/2018   GLUCOSE 91 02/18/2018   CHOL 137  12/01/2016   TRIG 52.0 12/01/2016   HDL 60.30 12/01/2016   LDLDIRECT 113.2 10/23/2013   LDLCALC 66 12/01/2016   ALT 20 02/18/2018   AST 22 02/18/2018   [name] 139 02/18/2018   K 3.7 02/18/2018   CL 104 02/18/2018   CREATININE 0.66 02/18/2018   BUN 11 02/18/2018   CO2 28 02/18/2018   TSH 1.57 02/18/2018   INR 0.96 01/31/2011   HGBA1C 5.9 02/18/2018    Assessment and Plan: See notes  Follow Up Instructions: See notes   I discussed the assessment and treatment plan with the patient. The patient was provided an opportunity to ask questions and all were answered. The patient agreed with the plan and demonstrated an understanding of the instructions.   The patient was advised to call back or seek an in-person evaluation if the symptoms worsen or if the condition fails to improve as anticipated.  Cathlean Cower, MD

## 2020-01-25 ENCOUNTER — Encounter: Payer: Self-pay | Admitting: Internal Medicine

## 2020-01-25 NOTE — Assessment & Plan Note (Signed)

## 2020-01-25 NOTE — Assessment & Plan Note (Signed)
stable overall by history and exam, recent data reviewed with pt, and pt to continue medical treatment as before,  to f/u any worsening symptoms or concerns  

## 2020-01-25 NOTE — Patient Instructions (Signed)
Please continue all other medications as before, and refills have been done if requested.  Please have the pharmacy call with any other refills you may need.  Please continue your efforts at being more active, low cholesterol diet, and weight control.  You are otherwise up to date with prevention measures today.  Please keep your appointments with your specialists as you may have planned  Please go to the LAB at the blood drawing area for the tests to be done  You will be contacted by phone if any changes need to be made immediately.  Otherwise, you will receive a letter about your results with an explanation, but please check with MyChart first.  Please remember to sign up for MyChart if you have not done so, as this will be important to you in the future with finding out test results, communicating by private email, and scheduling acute appointments online when needed.  Please make an Appointment to return in 6 months, or sooner if needed

## 2020-01-25 NOTE — Assessment & Plan Note (Signed)
For pulm referral per pt reqeust

## 2020-01-25 NOTE — Assessment & Plan Note (Signed)
For urine cx

## 2020-01-27 DIAGNOSIS — N390 Urinary tract infection, site not specified: Secondary | ICD-10-CM | POA: Diagnosis not present

## 2020-01-27 DIAGNOSIS — N3946 Mixed incontinence: Secondary | ICD-10-CM | POA: Diagnosis not present

## 2020-02-06 ENCOUNTER — Telehealth: Payer: Self-pay

## 2020-02-06 MED ORDER — BUDESONIDE-FORMOTEROL FUMARATE 160-4.5 MCG/ACT IN AERO: 2.0000 | INHALATION_SPRAY | Freq: Two times a day (BID) | RESPIRATORY_TRACT | 3 refills | Status: DC

## 2020-02-06 NOTE — Telephone Encounter (Signed)
New message    The patient calls stating she receive some medication and would like the nurse to call her to discuss.   The patient did not give the name of the medication only will speak with the nurse.

## 2020-02-06 NOTE — Telephone Encounter (Signed)
Pt wanted to know how to use the albuterol inhaler.  Pt informed it was a rescue inhaler to be used when needed.  Pt stated understanding.  Symbicort added back to her med list as it was removed by mistake.

## 2020-02-13 ENCOUNTER — Ambulatory Visit: Payer: Medicare HMO | Attending: Internal Medicine

## 2020-02-13 DIAGNOSIS — Z23 Encounter for immunization: Secondary | ICD-10-CM | POA: Insufficient documentation

## 2020-02-13 NOTE — Progress Notes (Signed)
Covid-19 Vaccination Clinic  Name:  Maria [name]    MRN: [id number] DOB: [birth year]  02/13/2020  [name] was observed post Covid-19 immunization for 30 minutes based on pre-vaccination screening without incidence. She was provided with Vaccine Information Sheet and instruction to access the V-Safe system.   [name] was instructed to call 911 with any severe reactions post vaccine: Marland Kitchen Difficulty breathing  . Swelling of your face and throat  . A fast heartbeat  . A bad rash all over your body  . Dizziness and weakness    Immunizations Administered    Name Date Dose VIS Date Route   Pfizer COVID-19 Vaccine 02/13/2020  9:49 AM 0.3 mL 11/28/2019 Intramuscular   Manufacturer: [place]   Lot: [id number]   [place]: [id number]

## 2020-03-09 ENCOUNTER — Ambulatory Visit: Payer: Medicare HMO | Attending: Internal Medicine

## 2020-03-09 DIAGNOSIS — Z23 Encounter for immunization: Secondary | ICD-10-CM

## 2020-03-09 NOTE — Progress Notes (Signed)
   Covid-19 Vaccination Clinic  Name:  Maria [name]    MRN: [id number] DOB: 1941-10-07  03/09/2020  Maria [name] was observed post Covid-19 immunization for 15 minutes without incident. She was provided with Vaccine Information Sheet and instruction to access the V-Safe system.   Maria [name] was instructed to call 911 with any severe reactions post vaccine: Marland Kitchen Difficulty breathing  . Swelling of face and throat  . A fast heartbeat  . A bad rash all over body  . Dizziness and weakness   Immunizations Administered    Name Date Dose VIS Date Route   Pfizer COVID-19 Vaccine 03/09/2020  4:44 PM 0.3 mL 11/28/2019 Intramuscular   Manufacturer: [place]   Lot: [id number]   [place]: [id number]

## 2020-03-25 ENCOUNTER — Other Ambulatory Visit: Payer: Self-pay

## 2020-03-25 ENCOUNTER — Ambulatory Visit
Admission: RE | Admit: 2020-03-25 | Discharge: 2020-03-25 | Disposition: A | Discharge status: Home / Self Care | Payer: Medicare HMO | Source: Ambulatory Visit | Attending: Internal Medicine | Admitting: Internal Medicine

## 2020-03-25 DIAGNOSIS — Z1231 Encounter for screening mammogram for malignant neoplasm of breast: Secondary | ICD-10-CM | POA: Diagnosis not present

## 2020-04-09 ENCOUNTER — Ambulatory Visit: Payer: Medicare HMO | Admitting: Pulmonary Disease

## 2020-04-09 ENCOUNTER — Other Ambulatory Visit: Payer: Self-pay

## 2020-04-09 ENCOUNTER — Encounter: Payer: Self-pay | Admitting: Pulmonary Disease

## 2020-04-09 DIAGNOSIS — E559 Vitamin D deficiency, unspecified: Secondary | ICD-10-CM

## 2020-04-09 DIAGNOSIS — J849 Interstitial pulmonary disease, unspecified: Secondary | ICD-10-CM | POA: Diagnosis not present

## 2020-04-09 DIAGNOSIS — N390 Urinary tract infection, site not specified: Secondary | ICD-10-CM | POA: Diagnosis not present

## 2020-04-09 DIAGNOSIS — R7302 Impaired glucose tolerance (oral): Secondary | ICD-10-CM | POA: Diagnosis not present

## 2020-04-09 DIAGNOSIS — E538 Deficiency of other specified B group vitamins: Secondary | ICD-10-CM

## 2020-04-09 DIAGNOSIS — Z0000 Encounter for general adult medical examination without abnormal findings: Secondary | ICD-10-CM | POA: Diagnosis not present

## 2020-04-09 DIAGNOSIS — E611 Iron deficiency: Secondary | ICD-10-CM | POA: Diagnosis not present

## 2020-04-09 NOTE — Patient Instructions (Signed)
Will need some additional test to determine the cause of the scarring in your lung and reassess it We will schedule you for high-resolution CT, pulmonary function test and a 6-minute walk test We will get some labs today and give you a questionnaire to fill out  Follow-up after PFTs to review

## 2020-04-09 NOTE — Progress Notes (Addendum)
Maria [name]    [id number]    [birth year]  Primary Care Physician:John, Hunt Oris, MD  Referring Physician: Biagio Borg, MD [address] [place],  [place] [address]  Chief complaint: Consult for interstitial lung disease  HPI: 79 year old with history of hypertension, stroke, sleep apnea. Referred for evaluation of interstitial lung disease This was noted on CT scan done in 2018.  Not on any treatment Notes increasing cough, dyspnea over the past few months.  No mucus production, wheezing or chills.  Pets: No pets Occupation: Retired Quarry manager at [employer] Exposures: No known exposures.  No mold, hot tub, Jacuzzi.  No asbestos, down pillows or comforters Smoking history: 5-pack-year smoker.  Quit in 1971 Travel history: No significant travel history Relevant family history: Grandfather had emphysema.  He was a pipe smoker  Outpatient Encounter Medications as of 04/09/2020  Medication Sig  . albuterol (PROAIR HFA) 108 (90 Base) MCG/ACT inhaler INHALE 2 PUFFS INTO THE LUNCH EVERY 6 HOURS AS NEEDED FOR WHEEZING OR SHORTNESS OF BREATH  . amLODipine (NORVASC) 5 MG tablet Take 1 tablet (5 mg total) by mouth daily.  Marland Kitchen b complex vitamins tablet Take 1 tablet by mouth daily.    . budesonide-formoterol (SYMBICORT) 160-4.5 MCG/ACT inhaler Inhale 2 puffs into the lungs 2 (two) times daily.  Marland Kitchen CALCIUM PO Take by mouth daily.    . Cholecalciferol (VITAMIN D-3 PO) Take by mouth daily.    . clopidogrel (PLAVIX) 75 MG tablet Take 1 tablet (75 mg total) by mouth daily.  . hydrochlorothiazide (MICROZIDE) 12.5 MG capsule Take 1 capsule (12.5 mg total) by mouth daily.  Marland Kitchen levocetirizine (XYZAL) 5 MG tablet Take 1 tablet (5 mg total) by mouth every evening.  . montelukast (SINGULAIR) 10 MG tablet Take 1 tablet (10 mg total) by mouth at bedtime.  Marland Kitchen oxybutynin (DITROPAN-XL) 10 MG 24 hr tablet TAKE 1 TABLET BY MOUTH Q DAILY  . pantoprazole (PROTONIX) 40 MG tablet Take 1  tablet (40 mg total) by mouth daily before breakfast.  . potassium chloride (KLOR-CON) 10 MEQ tablet Take 1 tablet (10 mEq total) by mouth daily.  . simvastatin (ZOCOR) 10 MG tablet TAKE 1 TABLET EVERY DAY  AT  6  PM  . trimethoprim (TRIMPEX) 100 MG tablet Take 100 mg by mouth daily.   . Turmeric (QC TUMERIC COMPLEX) 500 MG CAPS Take 1 capsule by mouth daily.   No facility-administered encounter medications on file as of 04/09/2020.    Allergies as of 04/09/2020 - Review Complete 04/09/2020  Allergen Reaction Noted  . Amoxicillin Hives 04/09/2013  . Ace inhibitors Other (See Comments) 05/10/2011  . Bee venom  10/13/2016  . Benicar [olmesartan medoxomil] Other (See Comments) 03/22/2011  . Codeine    . Lisinopril Other (See Comments) 05/10/2011  . Macrobid [nitrofurantoin monohyd macro]  06/08/2015  . Nutritional supplements  03/22/2011  . Shingrix [zoster vac recomb adjuvanted] Other (See Comments) 01/23/2020    Past Medical History:  Diagnosis Date  . Anemia   . Arthritis   . Bronchitis   . Carpal tunnel syndrome    in calves neuropathy  . Diverticulosis   . GERD (gastroesophageal reflux disease)   . Headache(784.0)    chronic  . High cholesterol   . Hyperlipidemia   . Hypertension   . Migraine   . Nephritis   . OSA (obstructive sleep apnea)   . Palpitations   . Personal history of colonic  adenomas 09/15/2010  . TIA (transient ischemic attack)   . Vertigo, benign paroxysmal     Past Surgical History:  Procedure Laterality Date  . BLADDER REPAIR    . BREAST BIOPSY     rt benign cyst  . CARPAL TUNNEL RELEASE     x 2  . COLONOSCOPY    . REPLACEMENT TOTAL KNEE BILATERAL    . TONSILLECTOMY    . UPPER GASTROINTESTINAL ENDOSCOPY      Family History  Problem Relation Age of Onset  . Diabetes Father   . Aneurysm Father   . Kidney disease Father   . Hypertension Father   . Heart disease Mother   . Colon polyps Mother   . Chronic bronchitis Mother   . Diabetes  Brother   . Colon cancer Maternal Grandmother   . Irritable bowel syndrome Daughter   . Emphysema Maternal Grandfather        smoked a pipe    Social History   Socioeconomic History  . Marital status: Divorced    Spouse name: Not on file  . Number of children: 3  . Years of education: Not on file  . Highest education level: Not on file  Occupational History  . Occupation: retired  Tobacco Use  . Smoking status: Former Smoker    Packs/day: 1.00    Years: 10.00    Pack years: 10.00    Types: Cigarettes    Quit date: 12/18/1969    Years since quitting: 50.3  . Smokeless tobacco: Never Used  Substance and Sexual Activity  . Alcohol use: Yes    Comment: ocassional use  . Drug use: No  . Sexual activity: Not Currently  Other Topics Concern  . Not on file  Social History Narrative   Retired, divorced 3 kids   Former smoker   Occ EtOH   No drugs   Social Determinants of Radio broadcast assistant Strain:   . Difficulty of Paying Living Expenses:   Food Insecurity:   . Worried About Charity fundraiser in the Last Year:   . Arboriculturist in the Last Year:   Transportation Needs:   . Film/video editor (Medical):   Marland Kitchen Lack of Transportation (Non-Medical):   Physical Activity: Insufficiently Active  . Days of Exercise per Week: 2 days  . Minutes of Exercise per Session: 40 min  Stress: No Stress Concern Present  . Feeling of Stress : Only a little  Social Connections: Unknown  . Frequency of Communication with Friends and Family: Not on file  . Frequency of Social Gatherings with Friends and Family: Not on file  . Attends Religious Services: More than 4 times per year  . Active Member of Clubs or Organizations: Not on file  . Attends Archivist Meetings: More than 4 times per year  . Marital Status: Divorced  Human resources officer Violence:   . Fear of Current or Ex-Partner:   . Emotionally Abused:   Marland Kitchen Physically Abused:   . Sexually Abused:      Review of systems: Review of Systems  Constitutional: Negative for fever and chills.  HENT: Negative.   Eyes: Negative for blurred vision.  Respiratory: as per HPI  Cardiovascular: Negative for chest pain and palpitations.  Gastrointestinal: Negative for vomiting, diarrhea, blood per rectum. Genitourinary: Negative for dysuria, urgency, frequency and hematuria.  Musculoskeletal: Negative for myalgias, back pain and joint pain.  Skin: Negative for itching and rash.  Neurological: Negative for  dizziness, tremors, focal weakness, seizures and loss of consciousness.  Endo/Heme/Allergies: Negative for environmental allergies.  Psychiatric/Behavioral: Negative for depression, suicidal ideas and hallucinations.  All other systems reviewed and are negative.  Physical Exam: Blood pressure 122/76, pulse 91, temperature 97.9 F (36.6 C), temperature source Temporal, height _0  (1.575 m), weight 176 lb 3.2 oz (79.9 kg), SpO2 93 %. Gen:      No acute distress HEENT:  EOMI, sclera anicteric Neck:     No masses; no thyromegaly Lungs:    Clear to auscultation bilaterally; normal respiratory effort CV:         Regular rate and rhythm; no murmurs Abd:      + bowel sounds; soft, non-tender; no palpable masses, no distension Ext:    No edema; adequate peripheral perfusion Skin:      Warm and dry; no rash Neuro: alert and oriented x 3 Psych: normal mood and affect  Data Reviewed: Imaging: High-resolution CT 03/29/2017-basal gradient fibrotic changes with reticulation, traction bronchiectasis.  Moderate air trapping.  Probable UIP pattern.  PFTs: 04/17/2017 FVC 1.69 [64%], FEV1 1.45 [73%], F/F 56, TLC 3.72 [76%], DLCO 14.90 [65%] Mild restriction with moderate diffusion defect.  Labs: RAST panel 04/27/2015-IgE 40, sensitive to grass, tree pollen CBC 02/18/2018-WBC 6.2, eos 3.9%, absolute eosinophil count 242  Assessment:  Pulmonary fibrosis Probable UIP pattern She will need reassessment  with high-res CT, PFTs and CTD serologies Has no significant exposures.  ILD questionnaire given  Reevaluate in 2 to 4 weeks and decide on next steps.  OSA Stable Follows with Dr. Halford Chessman  Plan/Recommendations: - High-res CT, PFTs, 6-minute walk - CTD serologies - ILD questionnaire   Marshell Garfinkel MD [facility] 04/09/2020, 9:21 AM  CC: Biagio Borg, MD

## 2020-04-12 LAB — ANA,IFA RA DIAG PNL W/RFLX TIT/PATN
Anti Nuclear Antibody (ANA): NEGATIVE
Cyclic Citrullin Peptide Ab: <16 UNITS
Rheumatoid fact SerPl-aCnc: <14 IU/mL (ref <14)

## 2020-04-12 LAB — ANTI-SCLERODERMA ANTIBODY: Scleroderma (Scl-70) (ENA) Antibody, IgG: <1 AI

## 2020-04-12 LAB — SJOGREN'S SYNDROME ANTIBODS(SSA + SSB)
SSA (Ro) (ENA) Antibody, IgG: <1 AI
SSB (La) (ENA) Antibody, IgG: <1 AI

## 2020-04-12 LAB — ANCA SCREEN W REFLEX TITER
ANCA Screen: POSITIVE — AB
Atypical P-ANCA titer: 1:80 {titer} — ABNORMAL HIGH

## 2020-04-15 ENCOUNTER — Other Ambulatory Visit: Payer: Self-pay

## 2020-04-15 ENCOUNTER — Ambulatory Visit (INDEPENDENT_AMBULATORY_CARE_PROVIDER_SITE_OTHER)
Admission: RE | Admit: 2020-04-15 | Discharge: 2020-04-15 | Disposition: A | Discharge status: Home / Self Care | Payer: Medicare HMO | Source: Ambulatory Visit | Attending: Pulmonary Disease | Admitting: Pulmonary Disease

## 2020-04-15 DIAGNOSIS — J849 Interstitial pulmonary disease, unspecified: Secondary | ICD-10-CM | POA: Diagnosis not present

## 2020-04-16 ENCOUNTER — Telehealth: Payer: Self-pay | Admitting: Pulmonary Disease

## 2020-04-16 ENCOUNTER — Other Ambulatory Visit (INDEPENDENT_AMBULATORY_CARE_PROVIDER_SITE_OTHER): Payer: Medicare HMO

## 2020-04-16 DIAGNOSIS — Z0000 Encounter for general adult medical examination without abnormal findings: Secondary | ICD-10-CM | POA: Diagnosis not present

## 2020-04-16 DIAGNOSIS — R7302 Impaired glucose tolerance (oral): Secondary | ICD-10-CM

## 2020-04-16 LAB — BASIC METABOLIC PANEL
BUN: 11 mg/dL (ref 6–23)
CO2: 29 mEq/L (ref 19–32)
Calcium: 9 mg/dL (ref 8.4–10.5)
Chloride: 100 mEq/L (ref 96–112)
Creatinine, Ser: 0.75 mg/dL (ref 0.40–1.20)
GFR: 74.46 mL/min (ref >60.00)
Glucose, Bld: 98 mg/dL (ref 70–99)
Potassium: 3.9 mEq/L (ref 3.5–5.1)
Sodium: 136 mEq/L (ref 135–145)

## 2020-04-16 LAB — IBC PANEL
Iron: 31 ug/dL — ABNORMAL LOW (ref 42–145)
Saturation Ratios: 8.6 % — ABNORMAL LOW (ref 20.0–50.0)
Transferrin: 256 mg/dL (ref 212.0–360.0)

## 2020-04-16 LAB — HEPATIC FUNCTION PANEL
ALT: 19 U/L (ref 0–35)
AST: 19 U/L (ref 0–37)
Albumin: 4.1 g/dL (ref 3.5–5.2)
Alkaline Phosphatase: 62 U/L (ref 39–117)
Bilirubin, Direct: 0.1 mg/dL (ref 0.0–0.3)
Total Bilirubin: 0.5 mg/dL (ref 0.2–1.2)
Total Protein: 6.8 g/dL (ref 6.0–8.3)

## 2020-04-16 LAB — VITAMIN B12: Vitamin B-12: >1500 pg/mL — ABNORMAL HIGH (ref 211–911)

## 2020-04-16 LAB — URINALYSIS
Bilirubin Urine: NEGATIVE
Hgb urine dipstick: NEGATIVE
Ketones, ur: NEGATIVE
Leukocytes,Ua: NEGATIVE
Nitrite: NEGATIVE
Specific Gravity, Urine: 1.015 (ref 1.000–1.030)
Total Protein, Urine: NEGATIVE
Urine Glucose: NEGATIVE
Urobilinogen, UA: 0.2 (ref 0.0–1.0)
pH: 7 (ref 5.0–8.0)

## 2020-04-16 LAB — HEMOGLOBIN A1C: Hgb A1c MFr Bld: 5.7 % (ref 4.6–6.5)

## 2020-04-16 LAB — CBC WITH DIFFERENTIAL/PLATELET
Basophils Absolute: 0.1 10*3/uL (ref 0.0–0.1)
Basophils Relative: 1.1 % (ref 0.0–3.0)
Eosinophils Absolute: 0.1 10*3/uL (ref 0.0–0.7)
Eosinophils Relative: 1.8 % (ref 0.0–5.0)
HCT: 38.7 % (ref 36.0–46.0)
Hemoglobin: 12.9 g/dL (ref 12.0–15.0)
Lymphocytes Relative: 31.2 % (ref 12.0–46.0)
Lymphs Abs: 2.5 10*3/uL (ref 0.7–4.0)
MCHC: 33.3 g/dL (ref 30.0–36.0)
MCV: 90.4 fl (ref 78.0–100.0)
Monocytes Absolute: 0.9 10*3/uL (ref 0.1–1.0)
Monocytes Relative: 11.6 % (ref 3.0–12.0)
Neutro Abs: 4.4 10*3/uL (ref 1.4–7.7)
Neutrophils Relative %: 54.3 % (ref 43.0–77.0)
Platelets: 290 10*3/uL (ref 150.0–400.0)
RBC: 4.28 Mil/uL (ref 3.87–5.11)
RDW: 14.3 % (ref 11.5–15.5)
WBC: 8.1 10*3/uL (ref 4.0–10.5)

## 2020-04-16 LAB — LIPID PANEL
Cholesterol: 137 mg/dL (ref 0–200)
HDL: 59.5 mg/dL (ref >39.00)
LDL Cholesterol: 56 mg/dL (ref 0–99)
NonHDL: 77.09
Total CHOL/HDL Ratio: 2
Triglycerides: 107 mg/dL (ref 0.0–149.0)
VLDL: 21.4 mg/dL (ref 0.0–40.0)

## 2020-04-16 LAB — HYPERSENSITIVITY PNEUMONITIS
A. Pullulans Abs: NEGATIVE
A.Fumigatus #1 Abs: NEGATIVE
Micropolyspora faeni, IgG: NEGATIVE
Pigeon Serum Abs: NEGATIVE
Thermoact. Saccharii: NEGATIVE
Thermoactinomyces vulgaris, IgG: NEGATIVE

## 2020-04-16 LAB — TSH: TSH: 2.99 u[IU]/mL (ref 0.35–4.50)

## 2020-04-16 LAB — VITAMIN D 25 HYDROXY (VIT D DEFICIENCY, FRACTURES): VITD: 77.48 ng/mL (ref 30.00–100.00)

## 2020-04-16 NOTE — Telephone Encounter (Signed)
Orders have been changed. Spoke with Caren Griffins at [facility] lab, she is aware. Nothing further was needed.

## 2020-04-17 LAB — URINE CULTURE
MICRO NUMBER:: 10425862
Result:: NO GROWTH
SPECIMEN QUALITY:: ADEQUATE

## 2020-04-20 LAB — MYOMARKER 3 PLUS PROFILE (RDL)

## 2020-05-07 DIAGNOSIS — G4733 Obstructive sleep apnea (adult) (pediatric): Secondary | ICD-10-CM | POA: Diagnosis not present

## 2020-05-26 ENCOUNTER — Telehealth (INDEPENDENT_AMBULATORY_CARE_PROVIDER_SITE_OTHER): Payer: Medicare HMO | Admitting: Acute Care

## 2020-05-26 ENCOUNTER — Other Ambulatory Visit: Payer: Self-pay

## 2020-05-26 DIAGNOSIS — J849 Interstitial pulmonary disease, unspecified: Secondary | ICD-10-CM | POA: Diagnosis not present

## 2020-05-26 NOTE — Progress Notes (Signed)
Virtual Visit via Telephone Note  I connected with Maria [name] on 05/26/20 at  1:30 PM EDT by telephone and verified that I am speaking with the correct person using two identifiers.  Location: Patient: Home Provider: Working remotely from home   I discussed the limitations, risks, security and privacy concerns of performing an evaluation and management service by telephone and the availability of in person appointments. I also discussed with the patient that there may be a patient responsible charge related to this service. The patient expressed understanding and agreed to proceed.  HPI: 79 year old with history of hypertension, stroke, sleep apnea. Referred for evaluation of interstitial lung disease This was noted on CT scan done in 2018.  Not on any treatment Notes increasing cough, dyspnea over the past few months.  No mucus production, wheezing or chills She had an exposure to DDT as a child ( middle School) and had severe respiratory problems ever since, including multiple flu illnesses that would take her 6-8 weeks to recover from. 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 Exposures: No known exposures.  No mold, hot tub, Jacuzzi.  No asbestos, down pillows or comforters Smoking history: 5-pack-year smoker.  Quit in 1971 Travel history: No significant travel history Relevant family history: Grandfather had emphysema.  He was a pipe smoker    History of Present Illness: Pt. Presents for follow up. She was last seen by Dr. Vaughan Browner 04/09/2020 for evaluation of dyspnea and worsening cough.She had a CT Chest done 03/2018 which showed a mild progression of disease. She states she has had a 25 pound weight gain She noticed an increase in SOB since Covid vaccine.She states she coughs up clear mucus throughout the day. She continues to  exercise and walk 5 miles several times a week. Pt. And I   reviewed her lab work ( which was negative for autoimmune etiology) and the results of her CT Scan, which shows mildly progressed findings. We discussed that we should get PFT's and a 6 minute walk to allow [place] to assess for any clinical signs of progression.She is in agreement with this plan. She has requested that we send her paper copies of the auto immune labs that were drawn 04/09/2020, and also a paper copy of the results of her 04/15/2020 HRCT.     Observations/Objective:  Pt.speaks in full sentences, is in NAD that I can hear via telephone Imaging: High-resolution CT 03/29/2017-basal gradient fibrotic changes with reticulation, traction bronchiectasis.  Moderate air trapping.  Probable UIP pattern.  HRCT 04/15/2020 Lungs/Pleura: No pneumothorax. No pleural effusion. No acute consolidative airspace disease or lung masses. Subcentimeter perifissural nodules along the left major fissure stable and considered benign. No new significant pulmonary nodules. No significant air trapping or evidence of tracheobronchomalacia on the expiration sequence. There is moderate patchy confluent subpleural reticulation and ground-glass opacity throughout both lungs with associated moderate traction bronchiectasis and architectural distortion. No frank honeycombing. There is a mild basilar predominance to these findings. Findings have progressed mildly since 03/29/2017 chest CT.  Spectrum of findings compatible with basilar predominant fibrotic interstitial lung disease without frank honeycombing, mildly progressed since 03/29/2017 chest CT. Findings are categorized as probable UIP per consensus guidelines  New mild right axillary adenopathy, nonspecific. Correlate for any history of recent COVID-19 vaccination to explain this finding. If there is no such history, suggest  follow-up chest CT with IV contrast in 3-6 months.  Covid Vaccination  dates 02/13/2020 and 03/09/2020   PFTs: 04/17/2017 FVC 1.69 [64%], FEV1 1.45 [73%], F/F 56, TLC 3.72 [76%], DLCO 14.90 [65%] Mild restriction with moderate diffusion defect.  Labs: RAST panel 04/27/2015-IgE 40, sensitive to grass, tree pollen CBC 02/18/2018-WBC 6.2, eos 3.9%, absolute eosinophil count 242   Assessment and Plan: Dyspnea ILD Probable UIP with mild progression per HRCT Autoimmune work up negative Plan Will order PFT's and 6 minute walk with follow up in 1 month on a Monday morning at 9 am. ( after above procedures have been completed) We will Send paper copy of all labs done by Dr. Vaughan Browner 4/23 at your request We will send a  paper copy of HRCT report at your request. Discuss anti fibrotic medications at follow up if appropriate Consider Pulmonary rehab, but patient is very active and exercises on her own.  Follow Up Instructions: follow up in 1 month on a Monday morning at 9 am. ( after above procedures have been completed) with Dr. Vaughan Browner or Judson Roch NP.    I discussed the assessment and treatment plan with the patient. The patient was provided an opportunity to ask questions and all were answered. The patient agreed with the plan and demonstrated an understanding of the instructions.   The patient was advised to call back or seek an in-person evaluation if the symptoms worsen or if the condition fails to improve as anticipated.  I provided 45 minutes of non-face-to-face time during this encounter.   Magdalen Spatz, NP 05/26/2020

## 2020-05-27 ENCOUNTER — Encounter: Payer: Self-pay | Admitting: Acute Care

## 2020-05-27 NOTE — Patient Instructions (Addendum)
It was good to talk with you today Will order PFT's and 6 minute walk with follow up in 1 month on a Monday morning at 9 am.( after above procedures have been done) with Dr. Vaughan Browner or Judson Roch NP We will Send paper copy of all labs done by Dr. Vaughan Browner 4/23 at your request We will send a  paper copy of HRCT  report done 4/28 at your request Continue exercising as you have been doing Please contact office for sooner follow up if symptoms do not improve or worsen or seek emergency care

## 2020-06-30 ENCOUNTER — Other Ambulatory Visit: Payer: Self-pay

## 2020-06-30 ENCOUNTER — Ambulatory Visit (INDEPENDENT_AMBULATORY_CARE_PROVIDER_SITE_OTHER): Payer: Medicare HMO

## 2020-06-30 DIAGNOSIS — R0602 Shortness of breath: Secondary | ICD-10-CM

## 2020-06-30 DIAGNOSIS — J849 Interstitial pulmonary disease, unspecified: Secondary | ICD-10-CM | POA: Diagnosis not present

## 2020-07-06 ENCOUNTER — Other Ambulatory Visit: Payer: Self-pay

## 2020-07-06 ENCOUNTER — Ambulatory Visit: Payer: Medicare HMO | Admitting: Pulmonary Disease

## 2020-07-06 ENCOUNTER — Ambulatory Visit (INDEPENDENT_AMBULATORY_CARE_PROVIDER_SITE_OTHER): Payer: Medicare HMO | Admitting: Pulmonary Disease

## 2020-07-06 ENCOUNTER — Encounter: Payer: Self-pay | Admitting: Pulmonary Disease

## 2020-07-06 VITALS — BP 112/62 | HR 59 | Temp 98.1°F | Ht 63.0 in | Wt 177.8 lb

## 2020-07-06 DIAGNOSIS — I10 Essential (primary) hypertension: Secondary | ICD-10-CM | POA: Diagnosis not present

## 2020-07-06 DIAGNOSIS — J849 Interstitial pulmonary disease, unspecified: Secondary | ICD-10-CM

## 2020-07-06 LAB — PULMONARY FUNCTION TEST
DL/VA % pred: 121 %
DL/VA: 5.01 ml/min/mmHg/L
DLCO cor % pred: 79 %
DLCO cor: 14.53 ml/min/mmHg
DLCO unc % pred: 79 %
DLCO unc: 14.53 ml/min/mmHg
FEF 25-75 Post: 1.97 L/sec
FEF 25-75 Pre: 2.02 L/sec
FEF2575-%Change-Post: -2 %
FEF2575-%Pred-Post: 140 %
FEF2575-%Pred-Pre: 143 %
FEV1-%Change-Post: 0 %
FEV1-%Pred-Post: 76 %
FEV1-%Pred-Pre: 76 %
FEV1-Post: 1.44 L
FEV1-Pre: 1.43 L
FEV1FVC-%Change-Post: -2 %
FEV1FVC-%Pred-Pre: 116 %
FEV6-%Change-Post: 3 %
FEV6-%Pred-Post: 71 %
FEV6-%Pred-Pre: 69 %
FEV6-Post: 1.72 L
FEV6-Pre: 1.67 L
FEV6FVC-%Pred-Post: 105 %
FEV6FVC-%Pred-Pre: 105 %
FVC-%Change-Post: 3 %
FVC-%Pred-Post: 68 %
FVC-%Pred-Pre: 65 %
FVC-Post: 1.72 L
FVC-Pre: 1.67 L
Post FEV1/FVC ratio: 84 %
Post FEV6/FVC ratio: 100 %
Pre FEV1/FVC ratio: 86 %
Pre FEV6/FVC Ratio: 100 %
RV % pred: 66 %
RV: 1.53 L
TLC % pred: 66 %
TLC: 3.26 L

## 2020-07-06 NOTE — Progress Notes (Signed)
PFT done today. 

## 2020-07-06 NOTE — Patient Instructions (Signed)
We will get you started on paperwork to start Esbriet therapy. We will schedule echocardiogram to evaluate pulmonary hypertension  Follow-up in 1 to 2 months.

## 2020-07-06 NOTE — Progress Notes (Signed)
Maria [name]    [id number]    1941/01/13  Primary Care Physician:John, Hunt Oris, MD  Referring Physician: Biagio Borg, MD [address] [place],  [place] [address]  Chief complaint: Follow-up for pulmonary fibrosis, IPF  HPI: 79 year old with history of hypertension, stroke, sleep apnea. Referred for evaluation of interstitial lung disease This was noted on CT scan done in 2018.  Not on any treatment Notes increasing cough, dyspnea over the past few months.  Symptoms worsen after COVID-19 vaccination.  No mucus production, wheezing or chills.  She had an exposure to DDT as a child ( middle School) and had severe respiratory problems ever since, including multiple flu illnesses that would take her 6-8 weeks to recover from.  Pets: No pets Occupation: Retired Quarry manager at [employer] Exposures: Reports exposure to DDD as a child and to acids in her lab at work but no known ongoing exposures.  No mold, hot tub, Jacuzzi.  No asbestos, down pillows or comforters Smoking history: 5-pack-year smoker.  Quit in 1971 Travel history: No significant travel history Relevant family history: Grandfather had emphysema.  He was a pipe smoker  Interim history: Here for review of lab results and discuss treatment options for pulmonary fibrosis  States that dyspnea is unchanged.  Has productive cough with clear mucus.  Outpatient Encounter Medications as of 07/06/2020  Medication Sig  . albuterol (PROAIR HFA) 108 (90 Base) MCG/ACT inhaler INHALE 2 PUFFS INTO THE LUNCH EVERY 6 HOURS AS NEEDED FOR WHEEZING OR SHORTNESS OF BREATH  . amLODipine (NORVASC) 5 MG tablet Take 1 tablet (5 mg total) by mouth daily.  Marland Kitchen b complex vitamins tablet Take 1 tablet by mouth daily.    . budesonide-formoterol (SYMBICORT) 160-4.5 MCG/ACT inhaler Inhale 2 puffs into the lungs 2 (two) times daily.  Marland Kitchen CALCIUM PO Take by mouth daily.    . Cholecalciferol (VITAMIN D-3 PO) Take by mouth daily.     . clopidogrel (PLAVIX) 75 MG tablet Take 1 tablet (75 mg total) by mouth daily.  . hydrochlorothiazide (MICROZIDE) 12.5 MG capsule Take 1 capsule (12.5 mg total) by mouth daily.  Marland Kitchen levocetirizine (XYZAL) 5 MG tablet Take 1 tablet (5 mg total) by mouth every evening.  . montelukast (SINGULAIR) 10 MG tablet Take 1 tablet (10 mg total) by mouth at bedtime.  Marland Kitchen oxybutynin (DITROPAN-XL) 10 MG 24 hr tablet TAKE 1 TABLET BY MOUTH Q DAILY  . pantoprazole (PROTONIX) 40 MG tablet Take 1 tablet (40 mg total) by mouth daily before breakfast.  . potassium chloride (KLOR-CON) 10 MEQ tablet Take 1 tablet (10 mEq total) by mouth daily.  . simvastatin (ZOCOR) 10 MG tablet TAKE 1 TABLET EVERY DAY  AT  6  PM  . trimethoprim (TRIMPEX) 100 MG tablet Take 100 mg by mouth daily.   . Turmeric (QC TUMERIC COMPLEX) 500 MG CAPS Take 1 capsule by mouth daily.   No facility-administered encounter medications on file as of 07/06/2020.   Physical Exam: Blood pressure 122/76, pulse 91, temperature 97.9 F (36.6 C), temperature source Temporal, height _0  (1.575 m), weight 176 lb 3.2 oz (79.9 kg), SpO2 93 %. Gen:      No acute distress HEENT:  EOMI, sclera anicteric Neck:     No masses; no thyromegaly Lungs:    Clear to auscultation bilaterally; normal respiratory effort CV:         Regular rate and rhythm; no murmurs Abd:      +  bowel sounds; soft, non-tender; no palpable masses, no distension Ext:    No edema; adequate peripheral perfusion Skin:      Warm and dry; no rash Neuro: alert and oriented x 3 Psych: normal mood and affect  Data Reviewed: Imaging: High-resolution CT 03/29/2017-basal gradient fibrotic changes with reticulation, traction bronchiectasis.  Moderate air trapping.  Probable UIP pattern.  PFTs: 04/17/2017 FVC 1.69 [64%], FEV1 1.45 [73%], F/F 86, TLC 3.72 [76%], DLCO 14.90 [65%]  06/17/2020 FVC 1.72 [68%], FEV1 1.44 [76%], F/F 84, TLC 3.26 [6 6%], DLCO 14.53 [79%]  Moderate restriction with  diffusion defect.  Slightly worsened compared to 2018  6-minute walk test 06/30/2020- 443 m, nadir O2 sat of 90%  Labs: RAST panel 04/27/2015-IgE 40, sensitive to grass, tree pollen CBC 02/18/2018-WBC 6.2, eos 3.9%, absolute eosinophil count 242  ILD serologies 04/09/2020-atypical p-ANCA 1:80  Assessment:  Pulmonary fibrosis Probable UIP pattern.  This is likely IPF with minimal exposures.  Serologies show mild elevation in ANCA which is likely nonspecific with no other signs and symptoms of autoimmune process.  Discussed findings in detail.  To make a diagnosis with certainty we would need a lung biopsy but not recommended given her age. Given mild progression on CT scan and PFTs with worsening symptoms we have decided to treat with antifibrotic Informed decision making with patient today and paperwork initiated for Esbriet  Lower extremity edema She has intermittent lower extremity edema which is concerning for pulmonary hypertension Schedule echocardiogram  OSA Stable Follows with Dr. Halford Chessman  Plan/Recommendations: - Start Esbriet paperwork - Echocardiogram  Marshell Garfinkel MD [facility] 07/06/2020, 2:28 PM  CC: Biagio Borg, MD

## 2020-07-08 ENCOUNTER — Telehealth: Payer: Self-pay | Admitting: Pharmacy Technician

## 2020-07-08 NOTE — Telephone Encounter (Signed)
Submitted a Prior Authorization request to [facility] for [place] via Cover My Meds. Will update once we receive a response.   (KeyJaneece Riggers) - [id number]

## 2020-07-08 NOTE — Telephone Encounter (Signed)
Received New start paperwork for ESBRIET. Will update as we work through the benefits process.  

## 2020-07-12 NOTE — Telephone Encounter (Signed)
Received notification from [facility] regarding a prior authorization for ESBRIET. Authorization has been APPROVED from 12/19/19 to 12/17/20.   Authorization # (KeyJaneece Riggers) - [id number]  Ran test claim, patient's copay is $2,711.55 for 1 month supply.

## 2020-07-13 NOTE — Telephone Encounter (Signed)
Patient scheduled Esbriet new start visit for Tuesday 8/3 at 2:20pm.

## 2020-07-13 NOTE — Telephone Encounter (Signed)
Left message for patient to call back to discuss Esbriet BIV and to obtain estimated household income to apply for grant foundation.

## 2020-07-13 NOTE — Telephone Encounter (Signed)
Patient called back and provided household info to apply for grant. Patient was Approved for $9,000 PF grant for copay assistance. Coverage dates are 06/13/20 through 06/12/21.  Pharmacy Card [id number]

## 2020-07-20 ENCOUNTER — Other Ambulatory Visit: Payer: Self-pay

## 2020-07-20 ENCOUNTER — Ambulatory Visit: Payer: Medicare HMO | Admitting: Pharmacist

## 2020-07-20 DIAGNOSIS — Z7189 Other specified counseling: Secondary | ICD-10-CM

## 2020-07-20 NOTE — Patient Instructions (Addendum)
Thank you for meeting with the pharmacy team today!  Below find a summary of what we discussed at your visit: . RINSE your mouth out after Symbicort to prevent fungal infections . Talk with Dr. Jenny Reichmann about heart risk and Ofev . Call the office if and when you are ready to start [place].  Call [phone] with any questions or concerns.  Mariella Saa, PharmD, [place], CPP Clinical Specialty Pharmacist (Rheumatology and Pulmonology)  07/20/2020 3:18 PM

## 2020-07-20 NOTE — Progress Notes (Signed)
HPI  Patient presents today to [facility] for Initial appt with pharmacy team for [place] counseling. Pertinent past medical history includes IPF, HTN, history of tobacco abuse, TIA, Afib, OSA, allergic rhinitis, and GERD. She is naive to anti-fibrotic therapy.  OBJECTIVE Allergies  Allergen Reactions  . Amoxicillin Hives  . Ace Inhibitors Other (See Comments)    unknown  . Bee Venom   . Benicar [Olmesartan Medoxomil] Other (See Comments)    Tongue swelling  . Codeine     REACTION: nausea  . Lisinopril Other (See Comments)    unknown  . Macrobid [Nitrofurantoin Monohyd Macro]     Pulmonary Fibrosis  . Nutritional Supplements     Bee stings cause angioedema-carry an EPIPEN  . Shingrix [Zoster Vac Recomb Adjuvanted] Other (See Comments)    Chills, feverish, nausea x 4 days    Outpatient Encounter Medications as of 07/20/2020  Medication Sig  . albuterol (PROAIR HFA) 108 (90 Base) MCG/ACT inhaler INHALE 2 PUFFS INTO THE LUNCH EVERY 6 HOURS AS NEEDED FOR WHEEZING OR SHORTNESS OF BREATH  . amLODipine (NORVASC) 5 MG tablet Take 1 tablet (5 mg total) by mouth daily.  Marland Kitchen b complex vitamins tablet Take 1 tablet by mouth daily.    . budesonide-formoterol (SYMBICORT) 160-4.5 MCG/ACT inhaler Inhale 2 puffs into the lungs 2 (two) times daily.  Marland Kitchen CALCIUM PO Take by mouth daily.    . Cholecalciferol (VITAMIN D-3 PO) Take by mouth daily.    . clopidogrel (PLAVIX) 75 MG tablet Take 1 tablet (75 mg total) by mouth daily.  . hydrochlorothiazide (MICROZIDE) 12.5 MG capsule Take 1 capsule (12.5 mg total) by mouth daily.  Marland Kitchen levocetirizine (XYZAL) 5 MG tablet Take 1 tablet (5 mg total) by mouth every evening.  . montelukast (SINGULAIR) 10 MG tablet Take 1 tablet (10 mg total) by mouth at bedtime.  Marland Kitchen oxybutynin (DITROPAN-XL) 10 MG 24 hr tablet TAKE 1 TABLET BY MOUTH Q DAILY  . pantoprazole (PROTONIX) 40 MG tablet Take 1 tablet (40 mg total) by mouth daily before breakfast.  . potassium chloride  (KLOR-CON) 10 MEQ tablet Take 1 tablet (10 mEq total) by mouth daily.  . simvastatin (ZOCOR) 10 MG tablet TAKE 1 TABLET EVERY DAY  AT  6  PM  . trimethoprim (TRIMPEX) 100 MG tablet Take 100 mg by mouth daily.   . Turmeric (QC TUMERIC COMPLEX) 500 MG CAPS Take 1 capsule by mouth daily.   No facility-administered encounter medications on file as of 07/20/2020.     Immunization History  Administered Date(s) Administered  . Influenza Split 10/22/2012, 08/25/2013  . Influenza Whole 08/18/2010  . Influenza, High Dose Seasonal PF 12/16/2015, 12/01/2016, 10/30/2017, 10/29/2018  . Influenza, Quadrivalent, Recombinant, Inj, Pf 08/23/2019  . Influenza,inj,Quad PF,6+ Mos 01/05/2015  . Influenza-Unspecified 10/18/2018  . PFIZER SARS-COV-2 Vaccination 02/13/2020, 03/09/2020  . Pneumococcal Conjugate-13 10/23/2013  . Pneumococcal Polysaccharide-23 08/18/2008  . Td 11/10/2014  . Zoster Recombinat (Shingrix) 09/08/2019     PFT's TLC  Date Value Ref Range Status  07/06/2020 3.26 L Final     CMP     Component Value Date/Time   NA 136 04/16/2020 1412   K 3.9 04/16/2020 1412   CL 100 04/16/2020 1412   CO2 29 04/16/2020 1412   GLUCOSE 98 04/16/2020 1412   BUN 11 04/16/2020 1412   CREATININE 0.75 04/16/2020 1412   CALCIUM 9.0 04/16/2020 1412   PROT 6.8 04/16/2020 1412   ALBUMIN 4.1 04/16/2020 1412   AST 19 04/16/2020  1412   ALT 19 04/16/2020 1412   ALKPHOS 62 04/16/2020 1412   BILITOT 0.5 04/16/2020 1412   GFRNONAA >60 02/02/2011 0313   GFRAA  02/02/2011 0313    >60        The eGFR has been calculated using the MDRD equation. This calculation has not been validated in all clinical situations. eGFR's persistently <60 mL/min signify possible Chronic Kidney Disease.     CBC    Component Value Date/Time   WBC 8.1 04/16/2020 1412   RBC 4.28 04/16/2020 1412   HGB 12.9 04/16/2020 1412   HCT 38.7 04/16/2020 1412   PLT 290.0 04/16/2020 1412   MCV 90.4 04/16/2020 1412   MCH 27.2  01/31/2011 1600   MCHC 33.3 04/16/2020 1412   RDW 14.3 04/16/2020 1412   LYMPHSABS 2.5 04/16/2020 1412   MONOABS 0.9 04/16/2020 1412   EOSABS 0.1 04/16/2020 1412   BASOSABS 0.1 04/16/2020 1412     LFT's Hepatic Function Latest Ref Rng & Units 04/16/2020 02/18/2018 12/01/2016  Total Protein 6.0 - 8.3 g/dL 6.8 6.9 7.0  Albumin 3.5 - 5.2 g/dL 4.1 3.8 4.4  AST 0 - 37 U/L _0 ALT 0 - 35 U/L _1 Alk Phosphatase 39 - 117 U/L 62 63 59  Total Bilirubin 0.2 - 1.2 mg/dL 0.5 0.4 0.4  Bilirubin, Direct 0.0 - 0.3 mg/dL 0.1 0.1 0.1    ASSESSMENT  1. Esbriet Medication Management  Patient counseled on purpose, proper use, and potential adverse effects including nausea, vomiting, abdominal pain, GERD, weight loss, arthralgia, dizziness, and suns sensitivity/rash.  Stressed the importance of routine lab monitoring. Will monitor LFT's every month for the first 6 months of treatment then every 3 months. Will monitor CBC every 3 months.  Starting dose will be Esbriet 267 mg 1 tablet three times daily for 7 days, then 2 tablets three times daily for 7 days, then 3 tablets three times daily.  Maintenance dose will be 801 mg 1 tablet three times daily if tolerated.  Stressed the importance of taking with meals to minimize stomach upset.    Esbriet was approved through insurance but is almost 3,000 a month.  Patient was enrolled in grant to cover the cost of the medication up to 9,000.  Will apply for patient assistance in the future if [facility] set-up and refill process.  She would like to use [facility] specialty pharmacy.  Patient has several reservations to starting Esbriet.  Patient states she has difficulty swallowing tablets and is also concerned about pill burden.  She also is concerned about sun sensitivity as she is very active outdoors.  Discussed alternative medication Ofev.  Advised [place] Gave is reserved for second line in patients who have  cardiovascular disease antiplatelet/anticoagulation.  Patient is concerned about increased risk of MI and stroke with Ofev.  Patient would like to delay starting therapy until after her appointment with cardiologist Dr. Jenny Reichmann and after her beach vacations.  She has a follow-up appointment in September and will revisit starting an antifibrotic at that time.  2. Medication Reconciliation  A drug regimen assessment was performed, including review of allergies, interactions, disease-state management, dosing and immunization history. Medications were reviewed with the patient, including name, instructions, indication, goals of therapy, potential side effects, importance of adherence, and safe use.  Drug interaction(s): no major drug interactions identified.   Patient not rinsing her mouth out after using Symbicort.  Instructed patient to  rinse mouth after use in order to prevent fungal infections.  3. Immunizations  Patient is up to date with annual influenza, Prevnar 13, and Covid-19 vaccines.  Patient is due for repeat Pneumovax 23.  She had a reaction to her first Shingrix vaccine and recommended for her not to receive second dose.  All questions encouraged and answered.  Instructed patient to call with any further questions or concerns.  Thank you for allowing pharmacy to participate in this patient's care.  This appointment required  60 minutes of patient care (this includes precharting, chart review, review of results, face-to-face care, etc.).    Mariella Saa, PharmD, [place], CPP Clinical Specialty Pharmacist (Rheumatology and Pulmonology)  07/21/2020 8:19 AM

## 2020-08-05 ENCOUNTER — Encounter: Payer: Self-pay | Admitting: Internal Medicine

## 2020-08-05 ENCOUNTER — Ambulatory Visit (INDEPENDENT_AMBULATORY_CARE_PROVIDER_SITE_OTHER): Payer: Medicare HMO | Admitting: Internal Medicine

## 2020-08-05 ENCOUNTER — Ambulatory Visit (HOSPITAL_COMMUNITY): Payer: Medicare HMO | Attending: Cardiology

## 2020-08-05 ENCOUNTER — Other Ambulatory Visit: Payer: Self-pay

## 2020-08-05 VITALS — BP 130/70 | HR 63 | Temp 97.9°F | Ht 63.0 in | Wt 177.0 lb

## 2020-08-05 DIAGNOSIS — E611 Iron deficiency: Secondary | ICD-10-CM | POA: Diagnosis not present

## 2020-08-05 DIAGNOSIS — I10 Essential (primary) hypertension: Secondary | ICD-10-CM | POA: Diagnosis not present

## 2020-08-05 DIAGNOSIS — R7302 Impaired glucose tolerance (oral): Secondary | ICD-10-CM | POA: Diagnosis not present

## 2020-08-05 DIAGNOSIS — E785 Hyperlipidemia, unspecified: Secondary | ICD-10-CM | POA: Diagnosis not present

## 2020-08-05 LAB — CBC WITH DIFFERENTIAL/PLATELET
Absolute Monocytes: 873 {cells}/uL (ref 200–950)
Basophils Absolute: 72 {cells}/uL (ref 0–200)
Basophils Relative: 0.8 %
Eosinophils Absolute: 108 {cells}/uL (ref 15–500)
Eosinophils Relative: 1.2 %
HCT: 43.1 % (ref 35.0–45.0)
Hemoglobin: 14.3 g/dL (ref 11.7–15.5)
Lymphs Abs: 2367 {cells}/uL (ref 850–3900)
MCH: 29.5 pg (ref 27.0–33.0)
MCHC: 33.2 g/dL (ref 32.0–36.0)
MCV: 89 fL (ref 80.0–100.0)
MPV: 11 fL (ref 7.5–12.5)
Monocytes Relative: 9.7 %
Neutro Abs: 5580 {cells}/uL (ref 1500–7800)
Neutrophils Relative %: 62 %
Platelets: 332 Thousand/uL (ref 140–400)
RBC: 4.84 Million/uL (ref 3.80–5.10)
RDW: 12.7 % (ref 11.0–15.0)
Total Lymphocyte: 26.3 %
WBC: 9 Thousand/uL (ref 3.8–10.8)

## 2020-08-05 LAB — ECHOCARDIOGRAM COMPLETE
Area-P 1/2: 4.15 cm2
P 1/2 time: 492 msec
S' Lateral: 2.8 cm

## 2020-08-05 NOTE — Progress Notes (Signed)
Subjective:    Patient ID: Maria [name], female    DOB: 22-Jul-1941, 79 y.o.   MRN: [id number]  HPI  Here to f/u; overall doing ok,  Pt denies chest pain, wheezing, orthopnea, PND, increased LE swelling, palpitations, dizziness or syncope.  Pt denies new neurological symptoms such as new headache, or facial or extremity weakness or numbness.  Pt denies polydipsia, polyuria, or low sugar episode.  Pt states overall good compliance with meds, mostly trying to follow appropriate diet, with wt overall stable,  but little exercise however.  Breathing some worse after vaccination, has been seeing pulmonary.  Had echocardiogram this am.  Denies worsening reflux, abd pain, dysphagia, n/v, bowel change or blood. Past Medical History:  Diagnosis Date  . Anemia   . Arthritis   . Bronchitis   . Carpal tunnel syndrome    in calves neuropathy  . Diverticulosis   . GERD (gastroesophageal reflux disease)   . Headache(784.0)    chronic  . High cholesterol   . Hyperlipidemia   . Hypertension   . Migraine   . Nephritis   . OSA (obstructive sleep apnea)   . Palpitations   . Personal history of colonic adenomas 09/15/2010  . TIA (transient ischemic attack)   . Vertigo, benign paroxysmal    Past Surgical History:  Procedure Laterality Date  . BLADDER REPAIR    . BREAST BIOPSY     rt benign cyst  . CARPAL TUNNEL RELEASE     x 2  . COLONOSCOPY    . REPLACEMENT TOTAL KNEE BILATERAL    . TONSILLECTOMY    . UPPER GASTROINTESTINAL ENDOSCOPY      reports that she quit smoking about 50 years ago. Her smoking use included cigarettes. She has a 10.00 pack-year smoking history. She has never used smokeless tobacco. She reports current alcohol use. She reports that she does not use drugs. family history includes Aneurysm in her father; Chronic bronchitis in her mother; Colon cancer in her maternal grandmother; Colon polyps in her mother; Diabetes in her brother and father; Emphysema in her maternal  grandfather; Heart disease in her mother; Hypertension in her father; Irritable bowel syndrome in her daughter; Kidney disease in her father. Allergies  Allergen Reactions  . Amoxicillin Hives  . Ace Inhibitors Other (See Comments)    unknown  . Bee Venom   . Benicar [Olmesartan Medoxomil] Other (See Comments)    Tongue swelling  . Codeine     REACTION: nausea  . Lisinopril Other (See Comments)    unknown  . Macrobid [Nitrofurantoin Monohyd Macro]     Pulmonary Fibrosis  . Nutritional Supplements     Bee stings cause angioedema-carry an EPIPEN  . Shingrix [Zoster Vac Recomb Adjuvanted] Other (See Comments)    Chills, feverish, nausea x 4 days   Current Outpatient Medications on File Prior to Visit  Medication Sig Dispense Refill  . albuterol (PROAIR HFA) 108 (90 Base) MCG/ACT inhaler INHALE 2 PUFFS INTO THE LUNCH EVERY 6 HOURS AS NEEDED FOR WHEEZING OR SHORTNESS OF BREATH 18 g 2  . amLODipine (NORVASC) 5 MG tablet Take 1 tablet (5 mg total) by mouth daily. 90 tablet 3  . b complex vitamins tablet Take 1 tablet by mouth daily.      . budesonide-formoterol (SYMBICORT) 160-4.5 MCG/ACT inhaler Inhale 2 puffs into the lungs 2 (two) times daily. 1 Inhaler 3  . CALCIUM PO Take by mouth daily.      . Cholecalciferol (VITAMIN D-3  PO) Take by mouth daily.      . clopidogrel (PLAVIX) 75 MG tablet Take 1 tablet (75 mg total) by mouth daily. 90 tablet 3  . co-enzyme Q-10 30 MG capsule Take 30 mg by mouth 3 (three) times daily.    . Collagen Hydrolysate POWD 1 Scoop by Does not apply route daily.    . hydrochlorothiazide (MICROZIDE) 12.5 MG capsule Take 1 capsule (12.5 mg total) by mouth daily. 90 capsule 3  . levocetirizine (XYZAL) 5 MG tablet Take 1 tablet (5 mg total) by mouth every evening. 90 tablet 3  . LUTEIN PO Take by mouth daily.    . montelukast (SINGULAIR) 10 MG tablet Take 1 tablet (10 mg total) by mouth at bedtime. 90 tablet 3  . Multiple Vitamins-Minerals (WOMENS MULTIVITAMIN PO)  Take 1 capsule by mouth daily.    Marland Kitchen oxybutynin (DITROPAN-XL) 10 MG 24 hr tablet TAKE 1 TABLET BY MOUTH Q DAILY 90 tablet 3  . pantoprazole (PROTONIX) 40 MG tablet Take 1 tablet (40 mg total) by mouth daily before breakfast. 90 tablet 3  . potassium chloride (KLOR-CON) 10 MEQ tablet Take 1 tablet (10 mEq total) by mouth daily. 90 tablet 3  . simvastatin (ZOCOR) 10 MG tablet TAKE 1 TABLET EVERY DAY  AT  6  PM 90 tablet 3  . trimethoprim (TRIMPEX) 100 MG tablet Take 100 mg by mouth daily.     . Turmeric (QC TUMERIC COMPLEX) 500 MG CAPS Take 1 capsule by mouth daily.    Marland Kitchen zinc gluconate 50 MG tablet Take 50 mg by mouth daily.     No current facility-administered medications on file prior to visit.   Review of Systems All otherwise neg per pt    Objective:   Physical Exam BP 130/70 (BP Location: Left Arm, Patient Position: Sitting, Cuff Size: Large)   Pulse 63   Temp 97.9 F (36.6 C) (Oral)   Ht _0  (1.6 m)   Wt 177 lb (80.3 kg)   SpO2 94%   BMI 31.35 kg/m  VS noted,  Constitutional: Pt appears in NAD HENT: Head: NCAT.  Right Ear: External ear normal.  Left Ear: External ear normal.  Eyes: . Pupils are equal, round, and reactive to light. Conjunctivae and EOM are normal Nose: without d/c or deformity Neck: Neck supple. Gross normal ROM Cardiovascular: Normal rate and regular rhythm.   Pulmonary/Chest: Effort normal and breath sounds without rales or wheezing.  Abd:  Soft, NT, ND, + BS, no organomegaly Neurological: Pt is alert. At baseline orientation, motor grossly intact Skin: Skin is warm. No rashes, other new lesions, no LE edema Psychiatric: Pt behavior is normal without agitation  All otherwise neg per pt Lab Results  Component Value Date   WBC 9.0 08/05/2020   HGB 14.3 08/05/2020   HCT 43.1 08/05/2020   PLT 332 08/05/2020   GLUCOSE 98 04/16/2020   CHOL 137 04/16/2020   TRIG 107.0 04/16/2020   HDL 59.50 04/16/2020   LDLDIRECT 113.2 10/23/2013   LDLCALC 56  04/16/2020   ALT 19 04/16/2020   AST 19 04/16/2020   [name] 136 04/16/2020   K 3.9 04/16/2020   CL 100 04/16/2020   CREATININE 0.75 04/16/2020   BUN 11 04/16/2020   CO2 29 04/16/2020   TSH 2.99 04/16/2020   INR 0.96 01/31/2011   HGBA1C 5.7 04/16/2020         Assessment & Plan:

## 2020-08-05 NOTE — Patient Instructions (Signed)
Please continue all other medications as before, and refills have been done if requested.  Please have the pharmacy call with any other refills you may need.  Please continue your efforts at being more active, low cholesterol diet, and weight control.  You are otherwise up to date with prevention measures today.  Please keep your appointments with your specialists as you may have planned  You will be contacted regarding the referral for: Dr Carlean Purl for iron deficiency and possible Gi blood loss  Please go to the LAB at the blood drawing area for the tests to be done  You will be contacted by phone if any changes need to be made immediately.  Otherwise, you will receive a letter about your results with an explanation, but please check with MyChart first.  Please remember to sign up for MyChart if you have not done so, as this will be important to you in the future with finding out test results, communicating by private email, and scheduling acute appointments online when needed.  Please make an Appointment to return in 6 months, or sooner if needed

## 2020-08-05 NOTE — Progress Notes (Signed)
Pt forgot to discuss with dr Jenny Reichmann re: pea sized soft nodule noted on right ring finger joint.  Pt states knot "appeared overnight" in April & has not grown in size since then. Knot appears to be slightly discolored, however, normal skin temp; not movable, palpates firm; pt states that nodule is not painful unless she really pushes on it.  She was actively pushing on it during the exam without noted discomfort.  Pt assured that this exam will be passed to Dr Jenny Reichmann for him to determine next steps.  Pt satisfied with RN response & denies further ques/concerns at this time.

## 2020-08-06 LAB — FERRITIN: Ferritin: 35 ng/mL (ref 16–288)

## 2020-08-06 LAB — TRANSFERRIN: Transferrin: 270 mg/dL (ref 188–341)

## 2020-08-07 ENCOUNTER — Encounter: Payer: Self-pay | Admitting: Internal Medicine

## 2020-08-07 NOTE — Assessment & Plan Note (Signed)
For lab recheck, refer GI dr Carlean Purl

## 2020-08-07 NOTE — Assessment & Plan Note (Signed)
stable overall by history and exam, recent data reviewed with pt, and pt to continue medical treatment as before,  to f/u any worsening symptoms or concerns

## 2020-08-07 NOTE — Assessment & Plan Note (Signed)
stable overall by history and exam, recent data reviewed with pt, and pt to continue medical treatment as before,  to f/u any worsening symptoms or concerns  

## 2020-08-11 ENCOUNTER — Telehealth: Payer: Self-pay | Admitting: Pulmonary Disease

## 2020-08-11 NOTE — Telephone Encounter (Signed)
Called and spoke with pt letting her know the info stated by Dr. Vaughan Browner and she verbalized understanding. Pt has been scheduled an appt with pharmacy for [place] med coaching. Nothing further needed.

## 2020-08-11 NOTE — Telephone Encounter (Signed)
Patient can be scheduled for an appointment on any open slot on the pharmacy schedule to further discuss medications. Thanks.   Mariella Saa, PharmD, [place], CPP Clinical Specialty Pharmacist (Rheumatology and Pulmonology)  08/11/2020 9:32 AM

## 2020-08-11 NOTE — Telephone Encounter (Signed)
Echo shows mild elevation in pressure.  A condition called pulmonary hypertension I discussed with her in detail at time of return visit.  She can start taking the antifibrotic medication

## 2020-08-11 NOTE — Telephone Encounter (Signed)
Spoke with the pt  She is asking if Dr Vaughan Browner has reviewed the ECHO yet  She states that she is needing to know if this was okay so she can start taking her antifibrotic or not  She states received a grant for medication, but needs to start taking this in August  Please advise, thanks

## 2020-08-12 ENCOUNTER — Ambulatory Visit: Payer: Self-pay

## 2020-08-18 NOTE — Telephone Encounter (Signed)
Patient is scheduled for appointment tomorrow on 9/2. Nothing further needed at this time,

## 2020-08-19 ENCOUNTER — Ambulatory Visit (INDEPENDENT_AMBULATORY_CARE_PROVIDER_SITE_OTHER): Payer: Medicare HMO | Admitting: Internal Medicine

## 2020-08-19 ENCOUNTER — Ambulatory Visit: Payer: Medicare HMO | Admitting: Pharmacist

## 2020-08-19 ENCOUNTER — Other Ambulatory Visit: Payer: Self-pay | Admitting: Pharmacist

## 2020-08-19 ENCOUNTER — Encounter: Payer: Self-pay | Admitting: Internal Medicine

## 2020-08-19 ENCOUNTER — Other Ambulatory Visit: Payer: Self-pay

## 2020-08-19 VITALS — BP 118/62 | HR 74 | Ht 63.0 in | Wt 177.0 lb

## 2020-08-19 DIAGNOSIS — K219 Gastro-esophageal reflux disease without esophagitis: Secondary | ICD-10-CM

## 2020-08-19 DIAGNOSIS — R0989 Other specified symptoms and signs involving the circulatory and respiratory systems: Secondary | ICD-10-CM

## 2020-08-19 DIAGNOSIS — J8410 Pulmonary fibrosis, unspecified: Secondary | ICD-10-CM

## 2020-08-19 DIAGNOSIS — K222 Esophageal obstruction: Secondary | ICD-10-CM | POA: Diagnosis not present

## 2020-08-19 DIAGNOSIS — R6889 Other general symptoms and signs: Secondary | ICD-10-CM

## 2020-08-19 DIAGNOSIS — Z5181 Encounter for therapeutic drug level monitoring: Secondary | ICD-10-CM

## 2020-08-19 MED ORDER — ESBRIET 267 MG PO CAPS: 3.0000 | ORAL_CAPSULE | Freq: Three times a day (TID) | ORAL | 0 refills | Status: DC

## 2020-08-19 MED ORDER — ESBRIET 267 MG PO CAPS: ORAL_CAPSULE | ORAL | 0 refills | Status: DC

## 2020-08-19 NOTE — Patient Instructions (Addendum)
We are providing you with a handout on iron rich foods to read and follow.  Please start over the counter Ferrous Sulfate 330m : Take one every other day.   .Normal BMI (Body Mass Index- based on height and weight) is between 23 and 30. Your BMI today is Body mass index is 31.35 kg/m. .Marland KitchenPlease consider follow up  regarding your BMI with your Primary Care Provider.  Follow up as needed with Dr GCarlean Purl   I appreciate the opportunity to care for you. CSilvano Rusk MD, F[facility]

## 2020-08-19 NOTE — Progress Notes (Signed)
HPI  Patient presents today to [facility] for follow-up appt with pharmacy team for new start counseling for Esbriet. She had initial new start appointment 1 month ago, but was not ready to start. She presents today ready to begin therapy. Pertinent past medical history includes IPF, HTN, hx of tobacco abuse, TIA, AFIB, OSA, allergic rhinitis, and GERD. Patient is naive to anti-fibrotic therapy.   OBJECTIVE Allergies  Allergen Reactions  . Amoxicillin Hives  . Ace Inhibitors Other (See Comments)    unknown  . Bee Venom   . Benicar [Olmesartan Medoxomil] Other (See Comments)    Tongue swelling  . Codeine     REACTION: nausea  . Covid-19 (Mrna) Vaccine     Increased shortness of Breath in Fibrosis  . Lisinopril Other (See Comments)    unknown  . Macrobid [Nitrofurantoin Monohyd Macro]     Pulmonary Fibrosis  . Nutritional Supplements     Bee stings cause angioedema-carry an EPIPEN  . Shingrix [Zoster Vac Recomb Adjuvanted] Other (See Comments)    Chills, feverish, nausea x 4 days    Outpatient Encounter Medications as of 08/19/2020  Medication Sig Note  . ferrous sulfate 324 MG TBEC Take 324 mg by mouth every other day.   . albuterol (PROAIR HFA) 108 (90 Base) MCG/ACT inhaler INHALE 2 PUFFS INTO THE LUNCH EVERY 6 HOURS AS NEEDED FOR WHEEZING OR SHORTNESS OF BREATH 07/20/2020: Infrequent use.  Marland Kitchen amLODipine (NORVASC) 5 MG tablet Take 1 tablet (5 mg total) by mouth daily.   Marland Kitchen b complex vitamins tablet Take 1 tablet by mouth daily.     . budesonide-formoterol (SYMBICORT) 160-4.5 MCG/ACT inhaler Inhale 2 puffs into the lungs 2 (two) times daily.   Marland Kitchen CALCIUM PO Take by mouth daily.     . Cholecalciferol (VITAMIN D-3 PO) Take by mouth daily.     . clopidogrel (PLAVIX) 75 MG tablet Take 1 tablet (75 mg total) by mouth daily.   Marland Kitchen co-enzyme Q-10 30 MG capsule Take 30 mg by mouth 3 (three) times daily.   . Collagen Hydrolysate POWD 1 Scoop by Does not apply route daily.   .  hydrochlorothiazide (MICROZIDE) 12.5 MG capsule Take 1 capsule (12.5 mg total) by mouth daily.   Marland Kitchen levocetirizine (XYZAL) 5 MG tablet Take 1 tablet (5 mg total) by mouth every evening.   . LUTEIN PO Take by mouth daily.   . montelukast (SINGULAIR) 10 MG tablet Take 1 tablet (10 mg total) by mouth at bedtime.   . Multiple Vitamins-Minerals (WOMENS MULTIVITAMIN PO) Take 1 capsule by mouth daily.   Marland Kitchen oxybutynin (DITROPAN-XL) 10 MG 24 hr tablet TAKE 1 TABLET BY MOUTH Q DAILY   . pantoprazole (PROTONIX) 40 MG tablet Take 1 tablet (40 mg total) by mouth daily before breakfast.   . Pirfenidone (ESBRIET) 267 MG CAPS Take 1 capsule by mouth with breakfast, with lunch, and with evening meal for 7 days, THEN 2 capsules with breakfast, with lunch, and with evening meal for 7 days, THEN 3 capsules with breakfast, with lunch, and with evening meal for 16 days.   . Pirfenidone (ESBRIET) 267 MG CAPS Take 3 capsules by mouth with breakfast, with lunch, and with evening meal.   . potassium chloride (KLOR-CON) 10 MEQ tablet Take 1 tablet (10 mEq total) by mouth daily.   . simvastatin (ZOCOR) 10 MG tablet TAKE 1 TABLET EVERY DAY  AT  6  PM   . trimethoprim (TRIMPEX) 100 MG tablet Take 100 mg  by mouth daily.    . Turmeric (QC TUMERIC COMPLEX) 500 MG CAPS Take 1 capsule by mouth daily.   Marland Kitchen zinc gluconate 50 MG tablet Take 50 mg by mouth daily.    No facility-administered encounter medications on file as of 08/19/2020.     Immunization History  Administered Date(s) Administered  . Influenza Split 10/22/2012, 08/25/2013  . Influenza Whole 08/18/2010  . Influenza, High Dose Seasonal PF 12/16/2015, 12/01/2016, 10/30/2017, 10/29/2018  . Influenza, Quadrivalent, Recombinant, Inj, Pf 08/23/2019  . Influenza,inj,Quad PF,6+ Mos 01/05/2015  . Influenza-Unspecified 10/18/2018  . PFIZER SARS-COV-2 Vaccination 02/13/2020, 03/09/2020  . Pneumococcal Conjugate-13 10/23/2013  . Pneumococcal Polysaccharide-23 08/18/2008  . Td  11/10/2014  . Zoster Recombinat (Shingrix) 09/08/2019     HRCT  PFT's TLC  Date Value Ref Range Status  07/06/2020 3.26 L Final     CMP     Component Value Date/Time   NA 136 04/16/2020 1412   K 3.9 04/16/2020 1412   CL 100 04/16/2020 1412   CO2 29 04/16/2020 1412   GLUCOSE 98 04/16/2020 1412   BUN 11 04/16/2020 1412   CREATININE 0.75 04/16/2020 1412   CALCIUM 9.0 04/16/2020 1412   PROT 6.8 04/16/2020 1412   ALBUMIN 4.1 04/16/2020 1412   AST 19 04/16/2020 1412   ALT 19 04/16/2020 1412   ALKPHOS 62 04/16/2020 1412   BILITOT 0.5 04/16/2020 1412   GFRNONAA >60 02/02/2011 0313   GFRAA  02/02/2011 0313    >60        The eGFR has been calculated using the MDRD equation. This calculation has not been validated in all clinical situations. eGFR's persistently <60 mL/min signify possible Chronic Kidney Disease.     CBC    Component Value Date/Time   WBC 9.0 08/05/2020 1443   RBC 4.84 08/05/2020 1443   HGB 14.3 08/05/2020 1443   HCT 43.1 08/05/2020 1443   PLT 332 08/05/2020 1443   MCV 89.0 08/05/2020 1443   MCH 29.5 08/05/2020 1443   MCHC 33.2 08/05/2020 1443   RDW 12.7 08/05/2020 1443   LYMPHSABS 2,367 08/05/2020 1443   MONOABS 0.9 04/16/2020 1412   EOSABS 108 08/05/2020 1443   BASOSABS 72 08/05/2020 1443     LFT's Hepatic Function Latest Ref Rng & Units 04/16/2020 02/18/2018 12/01/2016  Total Protein 6.0 - 8.3 g/dL 6.8 6.9 7.0  Albumin 3.5 - 5.2 g/dL 4.1 3.8 4.4  AST 0 - 37 U/L _0 ALT 0 - 35 U/L _1 Alk Phosphatase 39 - 117 U/L 62 63 59  Total Bilirubin 0.2 - 1.2 mg/dL 0.5 0.4 0.4  Bilirubin, Direct 0.0 - 0.3 mg/dL 0.1 0.1 0.1       ASSESSMENT  1. Esbriet Medication Management  Patient counseled on purpose, proper use, and potential adverse effects including nausea, vomiting, abdominal pain, GERD, weight loss, arthralgia, dizziness, and suns sensitivity/rash. She appears more confident and comfortable with potential side effects that may  arise. We discussed potential strategies to reduce nausea if this became an issue. Patient verbalized understanding. Will monitor LFT's every month for the first 6 months of treatment then every 3 months. Will monitor CBC every 3 months.  Starting dose will be Esbriet 267 mg 1 tablet three times daily for 7 days, then 2 tablets three times daily for 7 days, then 3 tablets three times daily. Ideally, maintenance dose will be 801 mg 1 tablet three times daily, however, patient has difficulty swallowing pills (particularly tablets). Due  to this, will likely continue with 267 mg 3 capsules 3 times daily. Stressed the importance of taking with meals to minimize stomach upset. Patient verbalized understanding.  Esbriet was approved through insurance but is almost 3,000 a month.  Patient was enrolled in grant to cover the cost of the medication up to 9,000.  Will apply for patient assistance in the future if exhaust grant funding.  Reviewed specialty pharmacy set-up and refill process.  She would like to use [facility] specialty pharmacy at [facility]. Prescription sent to [facility] for starter dose and 30-day maintenance with first shipment scheduled for Tuesday.   At initial visit, patient had several reservations to starting Esbriet. She appears much more comfortable moving forward with Esbriet therapy at today's visit.  Nancy Fetter exposure continues to be a concerning side effects, but she reports willingness and understanding of sun sensitivity precautions.   2. Medication Reconciliation  A drug regimen assessment was performed, including review of allergies, interactions, disease-state management, dosing and immunization history. Medications were reviewed with the patient, including name, instructions, indication, goals of therapy, potential side effects, importance of adherence, and safe use.  Drug interaction(s): no concerns or DDI's identified   3. Immunizations  Patient is up to date with annual  influenza, Prevnar 13, and Covid-19 vaccines.  Patient is due for repeat Pneumovax 23.  She had a reaction to her first COVID vaccine and recommended for her not to receive second dose.  PLAN - Start Esbriet. Prescription sent to [facility]. Shipment set for Tuesday. - Hepatic panel every month for 6 months, then every 3 months. Standing order placed. - CBC every 3 months. Standing order placed.   All questions encouraged and answered.  Instructed patient to call with any further questions or concerns.  Thank you for allowing pharmacy to participate in this patient's care.  This appointment required  40 minutes of patient care (this includes precharting, chart review, review of results, face-to-face care, etc.).   Harriet Pho, PharmD PGY-1 [facility] Resident   08/19/2020 4:49 PM

## 2020-08-19 NOTE — Patient Instructions (Signed)
Thank you for meeting with the pharmacy team!  Below find a recap of what we discussed at your visit:  Start Esbriet  Blood work every month for the next 6 months  No appointment needed just check in at front desk between 8:30-1 and 2-5pm  Please call with any questions or concerns.  [phone]

## 2020-08-19 NOTE — Progress Notes (Signed)
Maria [name] 79 y.o. May 05, 1941 [id number]  Assessment & Plan:   Encounter Diagnoses  Name Primary?  . GERD with stricture Yes  . Chronic throat clearing   . Postinflammatory pulmonary fibrosis (HCC)     She seems to be doing relatively well overall her ferritin was only 35 when checked recently and she is not anemic but I think boosting her ferritin with some iron supplements and iron rich foods is reasonable so I have given her a list of iron rich foods and also have suggested ferrous sulfate over-the-counter every other day.  There is no need for endoscopic evaluation.  I explained that she may not get GERD symptoms with the new medication she just have to wait and see we will reassess if she does become symptomatic.  Regarding her chronic throat clearing that may be behavioral, she does not seem to have postnasal drip symptoms or anything like that I really do not think it is from GERD, question if it could be related to airway and lung disease.   She will return as needed  CC: Biagio Borg, MD    Subjective:   Chief Complaint:  HPI The patient is here at the request of Dr. Jenny Reichmann regarding history of iron deficiency anemia though she is not anemic now. I saw her last year and she had an EGD and a colonoscopy, she had some small precancerous polyps but no recall was planned given her age and number of polyps and she had GERD with stricture and was dilated by balloon dilation. She is not having dysphagia anymore but she continues to have a chronic throat clearing. She wonders if she could have a Zenker's diverticulum because her mother had the symptoms and had one. Her mother also had dysphagia. There is no rectal bleeding or melena etc.  She is about to start a new medication for her pulmonary fibrosis, Esbiret, and it may cause dyspepsia or GERD so she is wondering about the possibility of that. She continues on a PPI.   CBC Latest Ref Rng & Units 08/05/2020  04/16/2020 02/18/2018  WBC 3.8 - 10.8 Thousand/uL 9.0 8.1 6.2  Hemoglobin 11.7 - 15.5 g/dL 14.3 12.9 13.9  Hematocrit 35 - 45 % 43.1 38.7 41.8  Platelets 140 - 400 Thousand/uL 332 290.0 306.0   Lab Results  Component Value Date   FERRITIN 35 08/05/2020     Allergies  Allergen Reactions  . Amoxicillin Hives  . Ace Inhibitors Other (See Comments)    unknown  . Bee Venom   . Benicar [Olmesartan Medoxomil] Other (See Comments)    Tongue swelling  . Codeine     REACTION: nausea  . Covid-19 (Mrna) Vaccine     Increased shortness of Breath in Fibrosis  . Lisinopril Other (See Comments)    unknown  . Macrobid [Nitrofurantoin Monohyd Macro]     Pulmonary Fibrosis  . Nutritional Supplements     Bee stings cause angioedema-carry an EPIPEN  . Shingrix [Zoster Vac Recomb Adjuvanted] Other (See Comments)    Chills, feverish, nausea x 4 days   Current Meds  Medication Sig  . albuterol (PROAIR HFA) 108 (90 Base) MCG/ACT inhaler INHALE 2 PUFFS INTO THE LUNCH EVERY 6 HOURS AS NEEDED FOR WHEEZING OR SHORTNESS OF BREATH  . amLODipine (NORVASC) 5 MG tablet Take 1 tablet (5 mg total) by mouth daily.  Marland Kitchen b complex vitamins tablet Take 1 tablet by mouth daily.    . budesonide-formoterol (SYMBICORT) 160-4.5 MCG/ACT inhaler  Inhale 2 puffs into the lungs 2 (two) times daily.  Marland Kitchen CALCIUM PO Take by mouth daily.    . Cholecalciferol (VITAMIN D-3 PO) Take by mouth daily.    . clopidogrel (PLAVIX) 75 MG tablet Take 1 tablet (75 mg total) by mouth daily.  Marland Kitchen co-enzyme Q-10 30 MG capsule Take 30 mg by mouth 3 (three) times daily.  . Collagen Hydrolysate POWD 1 Scoop by Does not apply route daily.  . hydrochlorothiazide (MICROZIDE) 12.5 MG capsule Take 1 capsule (12.5 mg total) by mouth daily.  Marland Kitchen levocetirizine (XYZAL) 5 MG tablet Take 1 tablet (5 mg total) by mouth every evening.  . LUTEIN PO Take by mouth daily.  . montelukast (SINGULAIR) 10 MG tablet Take 1 tablet (10 mg total) by mouth at bedtime.  .  Multiple Vitamins-Minerals (WOMENS MULTIVITAMIN PO) Take 1 capsule by mouth daily.  Marland Kitchen oxybutynin (DITROPAN-XL) 10 MG 24 hr tablet TAKE 1 TABLET BY MOUTH Q DAILY  . pantoprazole (PROTONIX) 40 MG tablet Take 1 tablet (40 mg total) by mouth daily before breakfast.  . potassium chloride (KLOR-CON) 10 MEQ tablet Take 1 tablet (10 mEq total) by mouth daily.  . simvastatin (ZOCOR) 10 MG tablet TAKE 1 TABLET EVERY DAY  AT  6  PM  . trimethoprim (TRIMPEX) 100 MG tablet Take 100 mg by mouth daily.   . Turmeric (QC TUMERIC COMPLEX) 500 MG CAPS Take 1 capsule by mouth daily.  Marland Kitchen zinc gluconate 50 MG tablet Take 50 mg by mouth daily.   Past Medical History:  Diagnosis Date  . Anemia   . Arthritis   . Bronchitis   . Carpal tunnel syndrome    in calves neuropathy  . Diverticulosis   . GERD (gastroesophageal reflux disease)   . Headache(784.0)    chronic  . High cholesterol   . Hyperlipidemia   . Hypertension   . Migraine   . Nephritis   . OSA (obstructive sleep apnea)   . Palpitations   . Personal history of colonic adenomas 09/15/2010  . TIA (transient ischemic attack)   . Vertigo, benign paroxysmal    Past Surgical History:  Procedure Laterality Date  . BLADDER REPAIR    . BREAST BIOPSY     rt benign cyst  . CARPAL TUNNEL RELEASE     x 2  . COLONOSCOPY    . REPLACEMENT TOTAL KNEE BILATERAL    . TONSILLECTOMY    . UPPER GASTROINTESTINAL ENDOSCOPY     Social History   Social History Narrative   Retired, divorced 3 kids   Former smoker   Occ [employer]   No drugs   family history includes Aneurysm in her father; Chronic bronchitis in her mother; Colon cancer in her maternal grandmother; Colon polyps in her mother; Diabetes in her brother and father; Emphysema in her maternal grandfather; Heart disease in her mother; Hypertension in her father; Irritable bowel syndrome in her daughter; Kidney disease in her father.   Review of Systems As per HPI  Objective:   Physical  Exam Well-developed well-nourished elderly white woman in no acute distress BP 118/62   Pulse 74   Ht 5' 3" (1.6 m)   Wt 177 lb (80.3 kg)   BMI 31.35 kg/m

## 2020-08-25 ENCOUNTER — Other Ambulatory Visit: Payer: Self-pay

## 2020-08-25 ENCOUNTER — Ambulatory Visit (INDEPENDENT_AMBULATORY_CARE_PROVIDER_SITE_OTHER): Payer: Medicare HMO

## 2020-08-25 VITALS — BP 118/70 | HR 61 | Temp 98.2°F | Resp 16 | Ht 63.0 in | Wt 173.6 lb

## 2020-08-25 DIAGNOSIS — Z0000 Encounter for general adult medical examination without abnormal findings: Secondary | ICD-10-CM | POA: Diagnosis not present

## 2020-08-25 MED FILL — ESBRIET 267 MG TABS: 267 | 30 days supply | Qty: 207 | Fill #0

## 2020-08-25 NOTE — Patient Instructions (Signed)
Maria [name] , Thank you for taking time to come for your Medicare Wellness Visit. I appreciate your ongoing commitment to your health goals. Please review the following plan we discussed and let me know if I can assist you in the future.   Screening recommendations/referrals: Colonoscopy: 08/28/2019; no repeat due to age Mammogram: 03/25/2020; due every year Bone Density: 02/07/2017; due every 2-3 years Recommended yearly ophthalmology/optometry visit for glaucoma screening and checkup Recommended yearly dental visit for hygiene and checkup  Vaccinations: Influenza vaccine: 08/23/2019 Pneumococcal vaccine: completed Tdap vaccine: 11/10/2014; due every 10 years Shingles vaccine: 09/08/2019; reaction to vaccine   Covid-19: completed  Advanced directives: Please bring a copy of your health care power of attorney and living will to the office at your convenience.  Conditions/risks identified: Yes; Reviewed health maintenance screenings with patient today and relevant education, vaccines, and/or referrals were provided. Please continue to do your personal lifestyle choices by: daily care of teeth and gums, regular physical activity (goal should be 5 days a week for 30 minutes), eat a healthy diet, avoid tobacco and drug use, limiting any alcohol intake, taking a low-dose aspirin (if not allergic or have been advised by your provider otherwise) and taking vitamins and minerals as recommended by your provider. Continue doing brain stimulating activities (puzzles, reading, adult coloring books, staying active) to keep memory sharp. Continue to eat heart healthy diet (full of fruits, vegetables, whole grains, lean protein, water--limit salt, fat, and sugar intake) and increase physical activity as tolerated.  Next appointment: Please schedule your next Medicare Wellness Visit with your Nurse Health Advisor in 1 year.   Preventive Care 31 Years and Older, Female Preventive care refers to lifestyle choices and  visits with your health care provider that can promote health and wellness. What does preventive care include?  A yearly physical exam. This is also called an annual well check.  Dental exams once or twice a year.  Routine eye exams. Ask your health care provider how often you should have your eyes checked.  Personal lifestyle choices, including:  Daily care of your teeth and gums.  Regular physical activity.  Eating a healthy diet.  Avoiding tobacco and drug use.  Limiting alcohol use.  Practicing safe sex.  Taking low-dose aspirin every day.  Taking vitamin and mineral supplements as recommended by your health care provider. What happens during an annual well check? The services and screenings done by your health care provider during your annual well check will depend on your age, overall health, lifestyle risk factors, and family history of disease. Counseling  Your health care provider may ask you questions about your:  Alcohol use.  Tobacco use.  Drug use.  Emotional well-being.  Home and relationship well-being.  Sexual activity.  Eating habits.  History of falls.  Memory and ability to understand (cognition).  Work and work Statistician.  Reproductive health. Screening  You may have the following tests or measurements:  Height, weight, and BMI.  Blood pressure.  Lipid and cholesterol levels. These may be checked every 5 years, or more frequently if you are over 81 years old.  Skin check.  Lung cancer screening. You may have this screening every year starting at age 57 if you have a 30-pack-year history of smoking and currently smoke or have quit within the past 15 years.  Fecal occult blood test (FOBT) of the stool. You may have this test every year starting at age 79.  Flexible sigmoidoscopy or colonoscopy. You may have a  sigmoidoscopy every 5 years or a colonoscopy every 10 years starting at age 51.  Hepatitis C blood test.  Hepatitis B  blood test.  Sexually transmitted disease (STD) testing.  Diabetes screening. This is done by checking your blood sugar (glucose) after you have not eaten for a while (fasting). You may have this done every 1-3 years.  Bone density scan. This is done to screen for osteoporosis. You may have this done starting at age 44.  Mammogram. This may be done every 1-2 years. Talk to your health care provider about how often you should have regular mammograms. Talk with your health care provider about your test results, treatment options, and if necessary, the need for more tests. Vaccines  Your health care provider may recommend certain vaccines, such as:  Influenza vaccine. This is recommended every year.  Tetanus, diphtheria, and acellular pertussis (Tdap, Td) vaccine. You may need a Td booster every 10 years.  Zoster vaccine. You may need this after age 86.  Pneumococcal 13-valent conjugate (PCV13) vaccine. One dose is recommended after age 68.  Pneumococcal polysaccharide (PPSV23) vaccine. One dose is recommended after age 53. Talk to your health care provider about which screenings and vaccines you need and how often you need them. This information is not intended to replace advice given to you by your health care provider. Make sure you discuss any questions you have with your health care provider. Document Released: 12/31/2015 Document Revised: 08/23/2016 Document Reviewed: 10/05/2015 Elsevier Interactive Patient Education  2017 [employer] Prevention in the Home Falls can cause injuries. They can happen to people of all ages. There are many things you can do to make your home safe and to help prevent falls. What can I do on the outside of my home?  Regularly fix the edges of walkways and driveways and fix any cracks.  Remove anything that might make you trip as you walk through a door, such as a raised step or threshold.  Trim any bushes or trees on the path to your  home.  Use bright outdoor lighting.  Clear any walking paths of anything that might make someone trip, such as rocks or tools.  Regularly check to see if handrails are loose or broken. Make sure that both sides of any steps have handrails.  Any raised decks and porches should have guardrails on the edges.  Have any leaves, snow, or ice cleared regularly.  Use sand or salt on walking paths during winter.  Clean up any spills in your garage right away. This includes oil or grease spills. What can I do in the bathroom?  Use night lights.  Install grab bars by the toilet and in the tub and shower. Do not use towel bars as grab bars.  Use non-skid mats or decals in the tub or shower.  If you need to sit down in the shower, use a plastic, non-slip stool.  Keep the floor dry. Clean up any water that spills on the floor as soon as it happens.  Remove soap buildup in the tub or shower regularly.  Attach bath mats securely with double-sided non-slip rug tape.  Do not have throw rugs and other things on the floor that can make you trip. What can I do in the bedroom?  Use night lights.  Make sure that you have a light by your bed that is easy to reach.  Do not use any sheets or blankets that are too big for your bed. They  should not hang down onto the floor.  Have a firm chair that has side arms. You can use this for support while you get dressed.  Do not have throw rugs and other things on the floor that can make you trip. What can I do in the kitchen?  Clean up any spills right away.  Avoid walking on wet floors.  Keep items that you use a lot in easy-to-reach places.  If you need to reach something above you, use a strong step stool that has a grab bar.  Keep electrical cords out of the way.  Do not use floor polish or wax that makes floors slippery. If you must use wax, use non-skid floor wax.  Do not have throw rugs and other things on the floor that can make you  trip. What can I do with my stairs?  Do not leave any items on the stairs.  Make sure that there are handrails on both sides of the stairs and use them. Fix handrails that are broken or loose. Make sure that handrails are as long as the stairways.  Check any carpeting to make sure that it is firmly attached to the stairs. Fix any carpet that is loose or worn.  Avoid having throw rugs at the top or bottom of the stairs. If you do have throw rugs, attach them to the floor with carpet tape.  Make sure that you have a light switch at the top of the stairs and the bottom of the stairs. If you do not have them, ask someone to add them for you. What else can I do to help prevent falls?  Wear shoes that:  Do not have high heels.  Have rubber bottoms.  Are comfortable and fit you well.  Are closed at the toe. Do not wear sandals.  If you use a stepladder:  Make sure that it is fully opened. Do not climb a closed stepladder.  Make sure that both sides of the stepladder are locked into place.  Ask someone to hold it for you, if possible.  Clearly mark and make sure that you can see:  Any grab bars or handrails.  First and last steps.  Where the edge of each step is.  Use tools that help you move around (mobility aids) if they are needed. These include:  Canes.  Walkers.  Scooters.  Crutches.  Turn on the lights when you go into a dark area. Replace any light bulbs as soon as they burn out.  Set up your furniture so you have a clear path. Avoid moving your furniture around.  If any of your floors are uneven, fix them.  If there are any pets around you, be aware of where they are.  Review your medicines with your doctor. Some medicines can make you feel dizzy. This can increase your chance of falling. Ask your doctor what other things that you can do to help prevent falls. This information is not intended to replace advice given to you by your health care provider. Make  sure you discuss any questions you have with your health care provider. Document Released: 09/30/2009 Document Revised: 05/11/2016 Document Reviewed: 01/08/2015 Elsevier Interactive Patient Education  2017 [employer].

## 2020-08-25 NOTE — Progress Notes (Signed)
Subjective:   Maria [name] is a 79 y.o. female who presents for Medicare Annual (Subsequent) preventive examination.  Review of Systems    No ROS. Medicare Wellness Visit Cardiac Risk Factors include: advanced age (>6mn, >>42women);dyslipidemia;family history of premature cardiovascular disease;hypertension     Objective:    Today's Vitals   08/25/20 1033  BP: 118/70  Pulse: 61  Resp: 16  Temp: 98.2 F (36.8 C)  SpO2: 97%  Weight: 173 lb 9.6 oz (78.7 kg)  Height: _0  (1.6 m)  PainSc: 0-No pain   Body mass index is 30.75 kg/m.  Advanced Directives 08/25/2020 08/08/2019 05/30/2018 11/16/2017 07/31/2017 05/29/2017 10/13/2016  Does Patient Have a Medical Advance Directive? Yes Yes Yes No Yes Yes No  Type of Advance Directive - HClevelandLiving will H[place]issLiving will - HHarvey CedarsLiving will HMagnoliaLiving will -  Does patient want to make changes to medical advance directive? No - Patient declined - - - No - Patient declined - -  Copy of [place] Chart? - No - copy requested No - copy requested - No - copy requested No - copy requested -  Would patient like information on creating a medical advance directive? - - - No - Patient declined - - No - patient declined information    Current Medications (verified) Outpatient Encounter Medications as of 08/25/2020  Medication Sig  . albuterol (PROAIR HFA) 108 (90 Base) MCG/ACT inhaler INHALE 2 PUFFS INTO THE LUNCH EVERY 6 HOURS AS NEEDED FOR WHEEZING OR SHORTNESS OF BREATH  . amLODipine (NORVASC) 5 MG tablet Take 1 tablet (5 mg total) by mouth daily.  .Marland Kitchenb complex vitamins tablet Take 1 tablet by mouth daily.    . budesonide-formoterol (SYMBICORT) 160-4.5 MCG/ACT inhaler Inhale 2 puffs into the lungs 2 (two) times daily.  .Marland KitchenCALCIUM PO Take by mouth daily.    . Cholecalciferol (VITAMIN D-3 PO) Take by mouth daily.    . clopidogrel  (PLAVIX) 75 MG tablet Take 1 tablet (75 mg total) by mouth daily.  .Marland Kitchenco-enzyme Q-10 30 MG capsule Take 30 mg by mouth 3 (three) times daily.  . Collagen Hydrolysate POWD 1 Scoop by Does not apply route daily.  . ferrous sulfate 324 MG TBEC Take 324 mg by mouth every other day.  . hydrochlorothiazide (MICROZIDE) 12.5 MG capsule Take 1 capsule (12.5 mg total) by mouth daily.  .Marland Kitchenlevocetirizine (XYZAL) 5 MG tablet Take 1 tablet (5 mg total) by mouth every evening.  . LUTEIN PO Take by mouth daily.  . montelukast (SINGULAIR) 10 MG tablet Take 1 tablet (10 mg total) by mouth at bedtime.  . Multiple Vitamins-Minerals (WOMENS MULTIVITAMIN PO) Take 1 capsule by mouth daily.  .Marland Kitchenoxybutynin (DITROPAN-XL) 10 MG 24 hr tablet TAKE 1 TABLET BY MOUTH Q DAILY  . pantoprazole (PROTONIX) 40 MG tablet Take 1 tablet (40 mg total) by mouth daily before breakfast.  . Pirfenidone (ESBRIET) 267 MG CAPS Take 1 capsule by mouth with breakfast, with lunch, and with evening meal for 7 days, THEN 2 capsules with breakfast, with lunch, and with evening meal for 7 days, THEN 3 capsules with breakfast, with lunch, and with evening meal for 16 days.  . Pirfenidone (ESBRIET) 267 MG CAPS Take 3 capsules by mouth with breakfast, with lunch, and with evening meal.  . potassium chloride (KLOR-CON) 10 MEQ tablet Take 1 tablet (10 mEq total) by mouth daily.  .Marland Kitchen  simvastatin (ZOCOR) 10 MG tablet TAKE 1 TABLET EVERY DAY  AT  6  PM  . trimethoprim (TRIMPEX) 100 MG tablet Take 100 mg by mouth daily.   . Turmeric (QC TUMERIC COMPLEX) 500 MG CAPS Take 1 capsule by mouth daily.  Marland Kitchen zinc gluconate 50 MG tablet Take 50 mg by mouth daily.   No facility-administered encounter medications on file as of 08/25/2020.    Allergies (verified) Amoxicillin, Ace inhibitors, Bee venom, Benicar [olmesartan medoxomil], Codeine, Covid-19 (mrna) vaccine, Lisinopril, Macrobid [nitrofurantoin monohyd macro], Nutritional supplements, and Shingrix [zoster vac recomb  adjuvanted]   History: Past Medical History:  Diagnosis Date  . Anemia   . Arthritis   . Bronchitis   . Carpal tunnel syndrome    in calves neuropathy  . Diverticulosis   . GERD (gastroesophageal reflux disease)   . Headache(784.0)    chronic  . High cholesterol   . Hyperlipidemia   . Hypertension   . Migraine   . Nephritis   . OSA (obstructive sleep apnea)   . Palpitations   . Personal history of colonic adenomas 09/15/2010  . TIA (transient ischemic attack)   . Vertigo, benign paroxysmal    Past Surgical History:  Procedure Laterality Date  . BLADDER REPAIR    . BREAST BIOPSY     rt benign cyst  . CARPAL TUNNEL RELEASE     x 2  . COLONOSCOPY    . REPLACEMENT TOTAL KNEE BILATERAL    . TONSILLECTOMY    . UPPER GASTROINTESTINAL ENDOSCOPY     Family History  Problem Relation Age of Onset  . Diabetes Father   . Aneurysm Father   . Kidney disease Father   . Hypertension Father   . Heart disease Mother   . Colon polyps Mother   . Chronic bronchitis Mother   . Diabetes Brother   . Colon cancer Maternal Grandmother   . Irritable bowel syndrome Daughter   . Emphysema Maternal Grandfather        smoked a pipe   Social History   Socioeconomic History  . Marital status: Divorced    Spouse name: Not on file  . Number of children: 3  . Years of education: Not on file  . Highest education level: Not on file  Occupational History  . Occupation: retired  Tobacco Use  . Smoking status: Former Smoker    Packs/day: 1.00    Years: 10.00    Pack years: 10.00    Types: Cigarettes    Quit date: 12/18/1969    Years since quitting: 50.7  . Smokeless tobacco: Never Used  Vaping Use  . Vaping Use: Never used  Substance and Sexual Activity  . Alcohol use: Yes    Comment: ocassional use  . Drug use: No  . Sexual activity: Not Currently  Other Topics Concern  . Not on file  Social History Narrative   Retired, divorced 3 kids   Former smoker   Occ EtOH   No drugs    Social Determinants of Radio broadcast assistant Strain: Low Risk   . Difficulty of Paying Living Expenses: Not hard at all  Food Insecurity: No Food Insecurity  . Worried About Charity fundraiser in the Last Year: Never true  . Ran Out of Food in the Last Year: Never true  Transportation Needs: No Transportation Needs  . Lack of Transportation (Medical): No  . Lack of Transportation (Non-Medical): No  Physical Activity:   . Days of  Exercise per Week: Not on file  . Minutes of Exercise per Session: Not on file  Stress: No Stress Concern Present  . Feeling of Stress : Not at all  Social Connections: Moderately Integrated  . Frequency of Communication with Friends and Family: More than three times a week  . Frequency of Social Gatherings with Friends and Family: More than three times a week  . Attends Religious Services: More than 4 times per year  . Active Member of Clubs or Organizations: Yes  . Attends Archivist Meetings: More than 4 times per year  . Marital Status: Divorced    Tobacco Counseling Counseling given: Not Answered   Clinical Intake:  Pre-visit preparation completed: Yes  Pain : No/denies pain Pain Score: 0-No pain     BMI - recorded: 30.75 Nutritional Status: BMI > 30  Obese Nutritional Risks: None Diabetes: No  How often do you need to have someone help you when you read instructions, pamphlets, or other written materials from your doctor or pharmacy?: 1 - Never What is the last grade level you completed in school?: College Graduate  Diabetic? no  Interpreter Needed?: No  Information entered by :: Brennen Gardiner N. Mykal Kirchman, LPN   Activities of Daily Living In your present state of health, do you have any difficulty performing the following activities: 08/25/2020  Hearing? N  Vision? N  Difficulty concentrating or making decisions? N  Walking or climbing stairs? N  Dressing or bathing? N  Doing errands, shopping? N  Preparing Food  and eating ? N  Using the Toilet? N  In the past six months, have you accidently leaked urine? N  Do you have problems with loss of bowel control? N  Managing your Medications? N  Managing your Finances? N  Housekeeping or managing your Housekeeping? N  Some recent data might be hidden    Patient Care Team: Biagio Borg, MD as PCP - General (Internal Medicine) Christian Mate, MD (Orthopedic Surgery) Bjorn Loser, MD as Consulting Physician (Urology)  Indicate any recent Medical Services you may have received from other than [facility] providers in the past year (date may be approximate).     Assessment:   This is a routine wellness examination for Lyncoln.  Hearing/Vision screen No exam data present  Dietary issues and exercise activities discussed: Current Exercise Habits: Home exercise routine, Type of exercise: Other - see comments;walking (hiking and line dancing), Time (Minutes): 30, Frequency (Times/Week): 5, Weekly Exercise (Minutes/Week): 150, Intensity: Mild, Exercise limited by: respiratory conditions(s);cardiac condition(s)  Goals    . lose 40 pounds     Physically engage like going to the gym 3 days a week and walk 2 days a week, plan and prepare my meals      Depression Screen PHQ 2/9 Scores 08/25/2020 08/05/2020 01/23/2020 08/08/2019 05/30/2018 05/29/2017 12/29/2016  PHQ - 2 Score 0 0 1 0 1 0 0  PHQ- 9 Score - - - - 3 - -    Fall Risk Fall Risk  08/05/2020 01/23/2020 08/08/2019 05/30/2018 05/29/2017  Falls in the past year? 0 0 0 No No  Number falls in past yr: 0 - 0 - -  Injury with Fall? 0 - - - -  Comment - - - - -  Risk for fall due to : No Fall Risks - - - -  Follow up Falls evaluation completed - - - -    Any stairs in or around the home? No  If so, are  there any without handrails? No  Home free of loose throw rugs in walkways, pet beds, electrical cords, etc? Yes  Adequate lighting in your home to reduce risk of falls? Yes   ASSISTIVE DEVICES UTILIZED TO  PREVENT FALLS:  Life alert? No  Use of a cane, walker or w/c? No  Grab bars in the bathroom? No  Shower chair or bench in shower? No  Elevated toilet seat or a handicapped toilet? No   TIMED UP AND GO:  Was the test performed? No .  Length of time to ambulate 10 feet: 0 sec.   Gait steady and fast without use of assistive device  Cognitive Function: not indicated; patient is cogitatively intact.      6CIT Screen 08/08/2019  What Year? 0 points  What month? 0 points  What time? 0 points  Count back from 20 0 points  Months in reverse 0 points  Repeat phrase 0 points  Total Score 0    Immunizations Immunization History  Administered Date(s) Administered  . Influenza Split 10/22/2012, 08/25/2013  . Influenza Whole 08/18/2010  . Influenza, High Dose Seasonal PF 12/16/2015, 12/01/2016, 10/30/2017, 10/29/2018  . Influenza, Quadrivalent, Recombinant, Inj, Pf 08/23/2019  . Influenza,inj,Quad PF,6+ Mos 01/05/2015  . Influenza-Unspecified 10/18/2018  . PFIZER SARS-COV-2 Vaccination 02/13/2020, 03/09/2020  . Pneumococcal Conjugate-13 10/23/2013  . Pneumococcal Polysaccharide-23 08/18/2008  . Td 11/10/2014  . Zoster Recombinat (Shingrix) 09/08/2019    TDAP status: Up to date Flu Vaccine status: Up to date Pneumococcal vaccine status: Up to date Covid-19 vaccine status: Completed vaccines  Qualifies for Shingles Vaccine? Yes   Zostavax completed No   Shingrix Completed?: No.    Education has been provided regarding the importance of this vaccine. Patient has been advised to call insurance company to determine out of pocket expense if they have not yet received this vaccine. Advised may also receive vaccine at local pharmacy or Health Dept. Verbalized acceptance and understanding.  Screening Tests Health Maintenance  Topic Date Due  . Hepatitis C Screening  Never done  . INFLUENZA VACCINE  07/18/2020  . TETANUS/TDAP  11/10/2024  . DEXA SCAN  Completed  . COVID-19 Vaccine   Completed  . PNA vac Low Risk Adult  Completed    Health Maintenance  Health Maintenance Due  Topic Date Due  . Hepatitis C Screening  Never done  . INFLUENZA VACCINE  07/18/2020    Colorectal cancer screening: No longer required.  Mammogram status: Completed 03/25/2020. Repeat every year Bone Density status: Completed 02/07/2017. Results reflect: Bone density results: OSTEOPENIA. Repeat every 2 years.  Lung Cancer Screening: (Low Dose CT Chest recommended if Age 66-80 years, 30 pack-year currently smoking OR have quit w/in 15years.) does not qualify.   Lung Cancer Screening Referral: no  Additional Screening:  Hepatitis C Screening: does qualify; Completed no  Vision Screening: Recommended annual ophthalmology exams for early detection of glaucoma and other disorders of the eye. Is the patient up to date with their annual eye exam?  Yes  Who is the provider or what is the name of the office in which the patient attends annual eye exams? Audry Pili, MD If pt is not established with a provider, would they like to be referred to a provider to establish care? No .   Dental Screening: Recommended annual dental exams for proper oral hygiene  Community Resource Referral / Chronic Care Management: CRR required this visit?  No   CCM required this visit?  No  Plan:     I have personally reviewed and noted the following in the patient's chart:   . Medical and social history . Use of alcohol, tobacco or illicit drugs  . Current medications and supplements . Functional ability and status . Nutritional status . Physical activity . Advanced directives . List of other physicians . Hospitalizations, surgeries, and ER visits in previous 12 months . Vitals . Screenings to include cognitive, depression, and falls . Referrals and appointments  In addition, I have reviewed and discussed with patient certain preventive protocols, quality metrics, and best practice recommendations. A  written personalized care plan for preventive services as well as general preventive health recommendations were provided to patient.     Sheral Flow, LPN   06/21/4236   Nurse Notes: n/a

## 2020-09-09 ENCOUNTER — Ambulatory Visit (INDEPENDENT_AMBULATORY_CARE_PROVIDER_SITE_OTHER): Payer: Medicare HMO | Admitting: Pulmonary Disease

## 2020-09-09 ENCOUNTER — Other Ambulatory Visit: Payer: Self-pay

## 2020-09-09 ENCOUNTER — Encounter: Payer: Self-pay | Admitting: Pulmonary Disease

## 2020-09-09 VITALS — BP 122/68 | HR 68 | Temp 98.7°F | Ht 62.0 in | Wt 175.2 lb

## 2020-09-09 DIAGNOSIS — J84112 Idiopathic pulmonary fibrosis: Secondary | ICD-10-CM | POA: Diagnosis not present

## 2020-09-09 DIAGNOSIS — I272 Pulmonary hypertension, unspecified: Secondary | ICD-10-CM

## 2020-09-09 DIAGNOSIS — R0600 Dyspnea, unspecified: Secondary | ICD-10-CM | POA: Diagnosis not present

## 2020-09-09 DIAGNOSIS — R0609 Other forms of dyspnea: Secondary | ICD-10-CM

## 2020-09-09 DIAGNOSIS — R062 Wheezing: Secondary | ICD-10-CM | POA: Diagnosis not present

## 2020-09-09 NOTE — Addendum Note (Signed)
Addended by: Suzzanne Cloud E on: 09/09/2020 03:56 PM   Modules accepted: Orders

## 2020-09-09 NOTE — Patient Instructions (Signed)
We will check labs today including comprehensive metabolic panel, N-terminal proBNP Covid IgG  Continue the Esbriet medication Continue with weight loss program and exercise  Follow-up in 1 month

## 2020-09-09 NOTE — Progress Notes (Addendum)
Maria [name]    [id number]    November 10, 1941  Primary Care Physician:John, Hunt Oris, MD  Referring Physician: Biagio Borg, MD [address] [place],  [place] [address]  Chief complaint: Follow-up for pulmonary fibrosis, IPF  HPI: 79 year old with history of hypertension, stroke, sleep apnea. Referred for evaluation of interstitial lung disease This was noted on CT scan done in 2018.  Not on any treatment Notes increasing cough, dyspnea over the past few months.  Symptoms worsen after COVID-19 vaccination.  No mucus production, wheezing or chills.  She had an exposure to DDT as a child ( middle School) and had severe respiratory problems ever since, including multiple flu illnesses that would take her 6-8 weeks to recover from.  Pets: No pets Occupation: Retired Quarry manager at [employer] Exposures: Reports exposure to DDD as a child and to acids in her lab at work but no known ongoing exposures.  No mold, hot tub, Jacuzzi.  No asbestos, down pillows or comforters Smoking history: 5-pack-year smoker.  Quit in 1971 Travel history: No significant travel history Relevant family history: Grandfather had emphysema.  He was a pipe smoker  Interim history: Started esbriet 2 weeks ago.  She is tolerating well without issue Chief complaint is cough which is nonproductive in nature  Outpatient Encounter Medications as of 09/09/2020  Medication Sig  . albuterol (PROAIR HFA) 108 (90 Base) MCG/ACT inhaler INHALE 2 PUFFS INTO THE LUNCH EVERY 6 HOURS AS NEEDED FOR WHEEZING OR SHORTNESS OF BREATH  . amLODipine (NORVASC) 5 MG tablet Take 1 tablet (5 mg total) by mouth daily.  Marland Kitchen b complex vitamins tablet Take 1 tablet by mouth daily.    . budesonide-formoterol (SYMBICORT) 160-4.5 MCG/ACT inhaler Inhale 2 puffs into the lungs 2 (two) times daily.  Marland Kitchen CALCIUM PO Take by mouth daily.    . Cholecalciferol (VITAMIN D-3 PO) Take by mouth daily.    . clopidogrel (PLAVIX) 75 MG  tablet Take 1 tablet (75 mg total) by mouth daily.  Marland Kitchen co-enzyme Q-10 30 MG capsule Take 30 mg by mouth 3 (three) times daily.  . Collagen Hydrolysate POWD 1 Scoop by Does not apply route daily.  . ferrous sulfate 324 MG TBEC Take 324 mg by mouth every other day.  . hydrochlorothiazide (MICROZIDE) 12.5 MG capsule Take 1 capsule (12.5 mg total) by mouth daily.  Marland Kitchen levocetirizine (XYZAL) 5 MG tablet Take 1 tablet (5 mg total) by mouth every evening.  . LUTEIN PO Take by mouth daily.  . montelukast (SINGULAIR) 10 MG tablet Take 1 tablet (10 mg total) by mouth at bedtime.  . Multiple Vitamins-Minerals (WOMENS MULTIVITAMIN PO) Take 1 capsule by mouth daily.  Marland Kitchen oxybutynin (DITROPAN-XL) 10 MG 24 hr tablet TAKE 1 TABLET BY MOUTH Q DAILY  . pantoprazole (PROTONIX) 40 MG tablet Take 1 tablet (40 mg total) by mouth daily before breakfast.  . Pirfenidone (ESBRIET) 267 MG CAPS Take 1 capsule by mouth with breakfast, with lunch, and with evening meal for 7 days, THEN 2 capsules with breakfast, with lunch, and with evening meal for 7 days, THEN 3 capsules with breakfast, with lunch, and with evening meal for 16 days.  . Pirfenidone (ESBRIET) 267 MG CAPS Take 3 capsules by mouth with breakfast, with lunch, and with evening meal.  . potassium chloride (KLOR-CON) 10 MEQ tablet Take 1 tablet (10 mEq total) by mouth daily.  . simvastatin (ZOCOR) 10 MG tablet TAKE 1 TABLET  EVERY DAY  AT  6  PM  . trimethoprim (TRIMPEX) 100 MG tablet Take 100 mg by mouth daily.   . Turmeric (QC TUMERIC COMPLEX) 500 MG CAPS Take 1 capsule by mouth daily.  Marland Kitchen zinc gluconate 50 MG tablet Take 50 mg by mouth daily.   No facility-administered encounter medications on file as of 09/09/2020.   Physical Exam: Blood pressure 122/68, pulse 68, temperature 98.7 F (37.1 C), temperature source Oral, height _0  (1.575 m), weight 175 lb 3.2 oz (79.5 kg), SpO2 93 %. Gen:      No acute distress HEENT:  EOMI, sclera anicteric Neck:     No masses;  no thyromegaly Lungs:    Clear to auscultation bilaterally; normal respiratory effort CV:         Regular rate and rhythm; no murmurs Abd:      + bowel sounds; soft, non-tender; no palpable masses, no distension Ext:    No edema; adequate peripheral perfusion Skin:      Warm and dry; no rash Neuro: alert and oriented x 3 Psych: normal mood and affect  Data Reviewed: Imaging: High-resolution CT 03/29/2017-basal gradient fibrotic changes with reticulation, traction bronchiectasis.  Moderate air trapping.  Probable UIP pattern.  PFTs: 04/17/2017 FVC 1.69 [64%], FEV1 1.45 [73%], F/F 86, TLC 3.72 [76%], DLCO 14.90 [65%]  06/17/2020 FVC 1.72 [68%], FEV1 1.44 [76%], F/F 84, TLC 3.26 [6 6%], DLCO 14.53 [79%]  Moderate restriction with diffusion defect.  Slightly worsened compared to 2018  6-minute walk test 06/30/2020- 443 m, nadir O2 sat of 90%  Labs: RAST panel 04/27/2015-IgE 40, sensitive to grass, tree pollen CBC 02/18/2018-WBC 6.2, eos 3.9%, absolute eosinophil count 242  ILD serologies 04/09/2020-atypical p-ANCA 1:80  Assessment:  Pulmonary fibrosis Probable UIP pattern.  This is likely IPF with minimal exposures.  Serologies show mild elevation in ANCA which is likely nonspecific with no other signs and symptoms of autoimmune process.  Discussed findings in detail.  To make a diagnosis with certainty we would need a lung biopsy but not recommended given her age. Given mild progression on CT scan and PFTs with worsening symptoms we have decided to treat with antifibrotic Continue Esbriet with titration Check CMP  Patient is interested in checking her Covid IgG and will add that to the labs  Mild pulmonary hypertension Noted on echocardiogram.  Patient and daughter would like to hold off on additional work-up such as right heart cath or tyvaso We will reassess in 3 to 6 months Check proBNP  Chronic cough Secondary to pulmonary fibrosis, GERD Increase PPI to twice daily Start  Tessalon for cough Advised to raise head of the bed and not lay down at least 2 hours after eating  OSA Stable Follows with Dr. Halford Chessman  Plan/Recommendations: - Continue Esbriet - Check CMP, proBNP - Check Covid IgG - Weight loss with diet and exercise - Increase Protonix to twice daily, Tessalon  Marshell Garfinkel MD [facility] 09/09/2020, 3:16 PM  CC: Biagio Borg, MD

## 2020-09-10 LAB — COMPREHENSIVE METABOLIC PANEL
ALT: 20 U/L (ref 0–35)
AST: 26 U/L (ref 0–37)
Albumin: 4.4 g/dL (ref 3.5–5.2)
Alkaline Phosphatase: 68 U/L (ref 39–117)
BUN: 18 mg/dL (ref 6–23)
CO2: 28 mEq/L (ref 19–32)
Calcium: 10 mg/dL (ref 8.4–10.5)
Chloride: 94 mEq/L — ABNORMAL LOW (ref 96–112)
Creatinine, Ser: 0.82 mg/dL (ref 0.40–1.20)
GFR: 67.1 mL/min (ref >60.00)
Glucose, Bld: 91 mg/dL (ref 70–99)
Potassium: 3.7 mEq/L (ref 3.5–5.1)
Sodium: 131 mEq/L — ABNORMAL LOW (ref 135–145)
Total Bilirubin: 0.4 mg/dL (ref 0.2–1.2)
Total Protein: 7.7 g/dL (ref 6.0–8.3)

## 2020-09-10 LAB — SARS-COV-2 ANTIBODY(IGG)SPIKE,SEMI-QUANTITATIVE: SARS COV1 AB(IGG)SPIKE,SEMI QN: >20 index — ABNORMAL HIGH (ref <1.00)

## 2020-09-10 LAB — PRO B NATRIURETIC PEPTIDE: NT-Pro BNP: 24 pg/mL (ref 0–738)

## 2020-09-29 ENCOUNTER — Other Ambulatory Visit: Payer: Self-pay | Admitting: Pharmacist

## 2020-09-29 DIAGNOSIS — J8410 Pulmonary fibrosis, unspecified: Secondary | ICD-10-CM

## 2020-09-29 MED FILL — ESBRIET 267 MG CAPSULE: 267 | 30 days supply | Qty: 270 | Fill #0

## 2020-10-08 ENCOUNTER — Other Ambulatory Visit: Payer: Self-pay | Admitting: *Deleted

## 2020-10-08 DIAGNOSIS — R1319 Other dysphagia: Secondary | ICD-10-CM

## 2020-10-08 MED ORDER — PANTOPRAZOLE SODIUM 40 MG PO TBEC: 40.0000 mg | DELAYED_RELEASE_TABLET | Freq: Two times a day (BID) | ORAL | 2 refills | Status: DC

## 2020-10-11 ENCOUNTER — Encounter: Payer: Self-pay | Admitting: Pulmonary Disease

## 2020-10-11 ENCOUNTER — Ambulatory Visit: Payer: Medicare HMO | Admitting: Pulmonary Disease

## 2020-10-11 ENCOUNTER — Other Ambulatory Visit: Payer: Self-pay

## 2020-10-11 ENCOUNTER — Ambulatory Visit (INDEPENDENT_AMBULATORY_CARE_PROVIDER_SITE_OTHER): Payer: Medicare HMO

## 2020-10-11 VITALS — BP 122/80 | HR 66 | Temp 98.9°F | Ht 62.0 in | Wt 169.0 lb

## 2020-10-11 DIAGNOSIS — R062 Wheezing: Secondary | ICD-10-CM

## 2020-10-11 DIAGNOSIS — R0609 Other forms of dyspnea: Secondary | ICD-10-CM

## 2020-10-11 DIAGNOSIS — R0600 Dyspnea, unspecified: Secondary | ICD-10-CM

## 2020-10-11 DIAGNOSIS — J84112 Idiopathic pulmonary fibrosis: Secondary | ICD-10-CM | POA: Diagnosis not present

## 2020-10-11 LAB — COMPREHENSIVE METABOLIC PANEL
ALT: 62 U/L — ABNORMAL HIGH (ref 0–35)
AST: 54 U/L — ABNORMAL HIGH (ref 0–37)
Albumin: 4.2 g/dL (ref 3.5–5.2)
Alkaline Phosphatase: 72 U/L (ref 39–117)
BUN: 10 mg/dL (ref 6–23)
CO2: 30 mEq/L (ref 19–32)
Calcium: 9.7 mg/dL (ref 8.4–10.5)
Chloride: 92 mEq/L — ABNORMAL LOW (ref 96–112)
Creatinine, Ser: 0.68 mg/dL (ref 0.40–1.20)
GFR: 82.54 mL/min (ref >60.00)
Glucose, Bld: 89 mg/dL (ref 70–99)
Potassium: 4 mEq/L (ref 3.5–5.1)
Sodium: 128 mEq/L — ABNORMAL LOW (ref 135–145)
Total Bilirubin: 0.6 mg/dL (ref 0.2–1.2)
Total Protein: 6.8 g/dL (ref 6.0–8.3)

## 2020-10-11 NOTE — Progress Notes (Addendum)
Maria [name]    [id number]    08-05-1941  Primary Care Physician:John, Hunt Oris, MD  Referring Physician: Biagio Borg, MD [address] [place],  [place] [address]  Chief complaint: Follow-up for  IPF Started Esbriet September 2242  HPI: 79 year old with history of hypertension, stroke, sleep apnea. Referred for evaluation of interstitial lung disease This was noted on CT scan done in 2018.  Not on any treatment Notes increasing cough, dyspnea over the past few months.  Symptoms worsen after COVID-19 vaccination.  No mucus production, wheezing or chills.  She had an exposure to DDT as a child ( middle School) and had severe respiratory problems ever since, including multiple flu illnesses that would take her 6-8 weeks to recover from.  Pets: No pets Occupation: Retired Quarry manager at [employer] Exposures: Reports exposure to DDD as a child and to acids in her lab at work but no known ongoing exposures.  No mold, hot tub, Jacuzzi.  No asbestos, down pillows or comforters Smoking history: 5-pack-year smoker.  Quit in 1971 Travel history: No significant travel history Relevant family history: Grandfather had emphysema.  He was a pipe smoker  Interim history: Continues on Esbriet.  She is on full dose now and is tolerating it well without any issues  Outpatient Encounter Medications as of 10/11/2020  Medication Sig  . albuterol (PROAIR HFA) 108 (90 Base) MCG/ACT inhaler INHALE 2 PUFFS INTO THE LUNCH EVERY 6 HOURS AS NEEDED FOR WHEEZING OR SHORTNESS OF BREATH  . amLODipine (NORVASC) 5 MG tablet Take 1 tablet (5 mg total) by mouth daily.  Marland Kitchen b complex vitamins tablet Take 1 tablet by mouth daily.    . budesonide-formoterol (SYMBICORT) 160-4.5 MCG/ACT inhaler Inhale 2 puffs into the lungs 2 (two) times daily.  Marland Kitchen CALCIUM PO Take by mouth daily.    . Cholecalciferol (VITAMIN D-3 PO) Take by mouth daily.    . clopidogrel (PLAVIX) 75 MG tablet Take 1 tablet  (75 mg total) by mouth daily.  Marland Kitchen co-enzyme Q-10 30 MG capsule Take 30 mg by mouth 3 (three) times daily.  . Collagen Hydrolysate POWD 1 Scoop by Does not apply route daily.  . ferrous sulfate 324 MG TBEC Take 324 mg by mouth every other day.  . hydrochlorothiazide (MICROZIDE) 12.5 MG capsule Take 1 capsule (12.5 mg total) by mouth daily.  Marland Kitchen levocetirizine (XYZAL) 5 MG tablet Take 1 tablet (5 mg total) by mouth every evening.  . LUTEIN PO Take by mouth daily.  . montelukast (SINGULAIR) 10 MG tablet Take 1 tablet (10 mg total) by mouth at bedtime.  . Multiple Vitamins-Minerals (WOMENS MULTIVITAMIN PO) Take 1 capsule by mouth daily.  Marland Kitchen oxybutynin (DITROPAN-XL) 10 MG 24 hr tablet TAKE 1 TABLET BY MOUTH Q DAILY  . pantoprazole (PROTONIX) 40 MG tablet Take 1 tablet (40 mg total) by mouth 2 (two) times daily.  . Pirfenidone (ESBRIET) 267 MG CAPS Take 3 capsules by mouth with breakfast, with lunch, and with evening meal.  . potassium chloride (KLOR-CON) 10 MEQ tablet Take 1 tablet (10 mEq total) by mouth daily.  . simvastatin (ZOCOR) 10 MG tablet TAKE 1 TABLET EVERY DAY  AT  6  PM  . trimethoprim (TRIMPEX) 100 MG tablet Take 100 mg by mouth daily.   . Turmeric (QC TUMERIC COMPLEX) 500 MG CAPS Take 1 capsule by mouth daily.  Marland Kitchen zinc gluconate 50 MG tablet Take 50 mg by mouth daily.  No facility-administered encounter medications on file as of 10/11/2020.   Physical Exam: Blood pressure 122/80, pulse 66, temperature 98.9 F (37.2 C), temperature source Oral, height _0  (1.575 m), weight 169 lb (76.7 kg), SpO2 95 %. Gen:      No acute distress HEENT:  EOMI, sclera anicteric Neck:     No masses; no thyromegaly Lungs:    Bibasal crackles CV:         Regular rate and rhythm; no murmurs Abd:      + bowel sounds; soft, non-tender; no palpable masses, no distension Ext:    No edema; adequate peripheral perfusion Skin:      Warm and dry; no rash Neuro: alert and oriented x 3 Psych: normal mood and  affect  Data Reviewed: Imaging: High-resolution CT 03/29/2017-basal gradient fibrotic changes with reticulation, traction bronchiectasis.  Moderate air trapping.  Probable UIP pattern.  PFTs: 04/17/2017 FVC 1.69 [64%], FEV1 1.45 [73%], F/F 86, TLC 3.72 [76%], DLCO 14.90 [65%]  06/17/2020 FVC 1.72 [68%], FEV1 1.44 [76%], F/F 84, TLC 3.26 [66%], DLCO 14.53 [79%]  Moderate restriction with diffusion defect.  Slightly worsened compared to 2018  6-minute walk test 06/30/2020- 443 m, nadir O2 sat of 90%  Labs: RAST panel 04/27/2015-IgE 40, sensitive to grass, tree pollen CBC 02/18/2018-WBC 6.2, eos 3.9%, absolute eosinophil count 242  N-terminal proBNP 09/10/2019 1-24  ILD serologies 04/09/2020-atypical p-ANCA 1:80  Assessment:  Pulmonary fibrosis Probable UIP pattern.  This is likely IPF with minimal exposures.  Serologies show mild elevation in ANCA which is likely nonspecific with no other signs and symptoms of autoimmune process.  Discussed findings in detail.  To make a diagnosis with certainty we would need a lung biopsy but not recommended given her age. Given mild progression on CT scan and PFTs with worsening symptoms we have decided to treat with antifibrotic Continue Esbriet Check CMP  Start supplemental oxygen with portable concentrator given desats on exertion Referred to rehab  Mild pulmonary hypertension Noted on echocardiogram.  Patient and daughter would like to hold off on additional work-up such as right heart cath or tyvaso.  proBNP is normal We will reassess in 3 to 6 months  Chronic cough Secondary to pulmonary fibrosis, GERD Continue PPI, Tessalon Advised to raise head of the bed and not lay down at least 2 hours after eating  OSA Stable Download reviewed today with good compliance  Plan/Recommendations: - Continue Esbriet - Check CMP - Weight loss with diet and exercise - Supplemental oxygen - Pulmonary rehab  Marshell Garfinkel MD [facility] 10/11/2020, 10:53 AM  CC: Biagio Borg, MD

## 2020-10-11 NOTE — Progress Notes (Signed)
Six Minute Walk - 10/11/20 1035      Six Minute Walk   Supplemental oxygen during test? No    Lap distance in meters  34 meters    Laps Completed 12    Partial lap (in meters) 2 meters    Baseline BP (sitting) 128/74    Baseline Heartrate 58    Baseline Dyspnea (Borg Scale) 3    Baseline Fatigue (Borg Scale) 2    Baseline SPO2 93 %      End of Test Values    BP (sitting) 130/74    Heartrate 95    Dyspnea (Borg Scale) 3    Fatigue (Borg Scale) 2    SPO2 86 %      2 Minutes Post Walk Values   BP (sitting) 124/74    Heartrate 76    SPO2 95 %    Stopped or paused before six minutes? No      Interpretation   Distance completed 410 meters    Tech Comments: After desat at end to 86% ra pt's sats recovered to above 90% ra after approx 20 seconds.

## 2020-10-11 NOTE — Patient Instructions (Signed)
We will see if he can get supplemental oxygen using portable concentrator as he desatted with 6-minute walk Check comprehensive metabolic panel today today and schedule another comprehensive metabolic panel in 1 month Referral to pulmonary rehab  Continue the Esbriet medication  Follow-up in 2 to 3 months.

## 2020-10-12 ENCOUNTER — Encounter: Payer: Self-pay | Admitting: Pulmonary Disease

## 2020-10-14 ENCOUNTER — Other Ambulatory Visit: Payer: Self-pay | Admitting: Pulmonary Disease

## 2020-10-14 DIAGNOSIS — J84112 Idiopathic pulmonary fibrosis: Secondary | ICD-10-CM

## 2020-10-14 DIAGNOSIS — Z5181 Encounter for therapeutic drug level monitoring: Secondary | ICD-10-CM

## 2020-10-29 ENCOUNTER — Other Ambulatory Visit: Payer: Self-pay | Admitting: Pulmonary Disease

## 2020-10-29 ENCOUNTER — Other Ambulatory Visit (INDEPENDENT_AMBULATORY_CARE_PROVIDER_SITE_OTHER): Payer: Medicare HMO

## 2020-10-29 ENCOUNTER — Other Ambulatory Visit: Payer: Self-pay | Admitting: Pharmacist

## 2020-10-29 DIAGNOSIS — J8410 Pulmonary fibrosis, unspecified: Secondary | ICD-10-CM

## 2020-10-29 DIAGNOSIS — J84112 Idiopathic pulmonary fibrosis: Secondary | ICD-10-CM

## 2020-10-29 DIAGNOSIS — Z5181 Encounter for therapeutic drug level monitoring: Secondary | ICD-10-CM

## 2020-10-29 LAB — COMPREHENSIVE METABOLIC PANEL
ALT: 63 U/L — ABNORMAL HIGH (ref 0–35)
AST: 39 U/L — ABNORMAL HIGH (ref 0–37)
Albumin: 4.3 g/dL (ref 3.5–5.2)
Alkaline Phosphatase: 68 U/L (ref 39–117)
BUN: 12 mg/dL (ref 6–23)
CO2: 26 mEq/L (ref 19–32)
Calcium: 9.6 mg/dL (ref 8.4–10.5)
Chloride: 98 mEq/L (ref 96–112)
Creatinine, Ser: 0.7 mg/dL (ref 0.40–1.20)
GFR: 81.94 mL/min (ref >60.00)
Glucose, Bld: 110 mg/dL — ABNORMAL HIGH (ref 70–99)
Potassium: 3.5 mEq/L (ref 3.5–5.1)
Sodium: 132 mEq/L — ABNORMAL LOW (ref 135–145)
Total Bilirubin: 0.8 mg/dL (ref 0.2–1.2)
Total Protein: 7.2 g/dL (ref 6.0–8.3)

## 2020-10-29 NOTE — Telephone Encounter (Signed)
Routing refill request

## 2020-11-01 MED FILL — ESBRIET 267 MG CAPSULE: 267 | 30 days supply | Qty: 270 | Fill #0

## 2020-11-17 ENCOUNTER — Telehealth: Payer: Self-pay | Admitting: Pulmonary Disease

## 2020-11-17 NOTE — Telephone Encounter (Signed)
Dr. Vaughan Browner Patient would like lab results. Please advise

## 2020-11-19 NOTE — Telephone Encounter (Signed)
Dr. Vaughan Browner patient would like results released to her mychart please  Also patient was walked on a POC while in office and order placed for POC and continuous and this is not going to work. Just need clarification if only want POC or want POC and a continuous stationary concentrator.    Dr. Vaughan Browner please advise

## 2020-11-19 NOTE — Telephone Encounter (Signed)
She has slightly elevated liver counts but this is stable.  No change in recommendations Continue Esbriet Order follow-up comprehensive metabolic panel in 1 month.

## 2020-11-19 NOTE — Telephone Encounter (Signed)
PCCs please advise if the current order will work for pt to receive a POC and stationary unit for nighttime use, or if a new order needs to be placed.  Thanks!

## 2020-11-19 NOTE — Telephone Encounter (Signed)
I have released orders  She will need a POC and also continuous stationary concentrator for use at home and at night

## 2020-11-22 NOTE — Telephone Encounter (Signed)
I think this order will be ok Joellen Jersey

## 2020-11-22 NOTE — Telephone Encounter (Signed)
Called and spoke to patient, she received [employer] statement on medications billed to her insurance. Advised patient, [employer] statements will not show any information about her grant and what it pays. Patient has not been charged or billed for any money from pharmacy. Patient's grant is active and picking up all copays.  Patient confirmed understanding and had no further statements. Nothing further is needed.

## 2020-11-22 NOTE — Telephone Encounter (Signed)
Patient checking on order for oxygen. Patient phone number is [phone]. Not available between 11:00 am to 2:00 pm.

## 2020-11-22 NOTE — Telephone Encounter (Signed)
Called and spoke with pt letting her know the info stated by Dr. Vaughan Browner and she verbalized understanding.  While speaking with pt, she wanted some clarification in regards to the grant that she had received for the [facility] and also wanted to know why she was having to pay about $700 a month for her medication. Pt is requesting pharmacy team to contact her to further discuss.  Routing to Pharmacy Team. Please advise.

## 2020-11-26 ENCOUNTER — Telehealth: Payer: Self-pay | Admitting: Pulmonary Disease

## 2020-11-26 DIAGNOSIS — J849 Interstitial pulmonary disease, unspecified: Secondary | ICD-10-CM | POA: Diagnosis not present

## 2020-11-26 DIAGNOSIS — G4733 Obstructive sleep apnea (adult) (pediatric): Secondary | ICD-10-CM | POA: Diagnosis not present

## 2020-11-26 MED FILL — ESBRIET 267 MG CAPSULE: 267 | 30 days supply | Qty: 270 | Fill #1

## 2020-11-26 NOTE — Telephone Encounter (Signed)
11/26/20   Patient reached   She is frustrated regarding updates from both [employer] as well as our office.  There was confusion regarding her scheduled best fit eval for POC.  She reports that she was never contacted by [employer].  She thought this was scheduled on 11/22/2020.  She also reviewed this with our pharmacy team per the patient.  We do not see this in the documentation from that call.  Contacted Melissa, [employer] representative.  She reported that the patient did miss her best for eval on 11/17/2020.  They will work on getting the patient scheduled for a follow-up.  Plan: I will contact the patient and let her know to be aware that [employer] should be contacting her with the next 48-72 business hours to get rescheduled for a best fit eval  Community message sent to [facility] Jordan, and [facility] pool as well  Reviewed with patient to contact her office if she has not heard anything from [employer] by 12/01/2020  Nothing further needed.  Wyn Quaker, FNP

## 2020-12-02 DIAGNOSIS — R8279 Other abnormal findings on microbiological examination of urine: Secondary | ICD-10-CM | POA: Diagnosis not present

## 2020-12-02 DIAGNOSIS — N3000 Acute cystitis without hematuria: Secondary | ICD-10-CM | POA: Diagnosis not present

## 2020-12-02 DIAGNOSIS — E119 Type 2 diabetes mellitus without complications: Secondary | ICD-10-CM | POA: Diagnosis not present

## 2020-12-08 ENCOUNTER — Encounter: Payer: Self-pay | Admitting: Pulmonary Disease

## 2020-12-08 ENCOUNTER — Other Ambulatory Visit: Payer: Self-pay

## 2020-12-08 ENCOUNTER — Ambulatory Visit: Payer: Medicare HMO | Admitting: Pulmonary Disease

## 2020-12-08 VITALS — BP 126/76 | HR 88 | Temp 97.9°F | Ht 62.0 in | Wt 166.8 lb

## 2020-12-08 DIAGNOSIS — J84112 Idiopathic pulmonary fibrosis: Secondary | ICD-10-CM | POA: Diagnosis not present

## 2020-12-08 DIAGNOSIS — Z5181 Encounter for therapeutic drug level monitoring: Secondary | ICD-10-CM | POA: Diagnosis not present

## 2020-12-08 LAB — COMPREHENSIVE METABOLIC PANEL
ALT: 17 U/L (ref 0–35)
AST: 20 U/L (ref 0–37)
Albumin: 4 g/dL (ref 3.5–5.2)
Alkaline Phosphatase: 73 U/L (ref 39–117)
BUN: 11 mg/dL (ref 6–23)
CO2: 30 mEq/L (ref 19–32)
Calcium: 9.5 mg/dL (ref 8.4–10.5)
Chloride: 96 mEq/L (ref 96–112)
Creatinine, Ser: 0.64 mg/dL (ref 0.40–1.20)
GFR: 83.66 mL/min (ref >60.00)
Glucose, Bld: 86 mg/dL (ref 70–99)
Potassium: 3.9 mEq/L (ref 3.5–5.1)
Sodium: 131 mEq/L — ABNORMAL LOW (ref 135–145)
Total Bilirubin: 0.5 mg/dL (ref 0.2–1.2)
Total Protein: 6.8 g/dL (ref 6.0–8.3)

## 2020-12-08 NOTE — Addendum Note (Signed)
Addended by: Elton Sin on: 12/08/2020 03:09 PM   Modules accepted: Orders

## 2020-12-08 NOTE — Addendum Note (Signed)
Addended by: Suzzanne Cloud E on: 12/08/2020 09:33 AM   Modules accepted: Orders

## 2020-12-08 NOTE — Patient Instructions (Addendum)
I am glad you are doing well with your breathing and tolerating the medications We will check a comprehensive metabolic panel today and every month for the next 3 months Continue exercise regimen We will make referral to pulmonary rehab  Follow-up in 3 months.

## 2020-12-08 NOTE — Progress Notes (Signed)
Maria [name]    [id number]    15-Feb-1941  Primary Care Physician:John, Hunt Oris, MD  Referring Physician: Biagio Borg, MD [address] [place],  [place] [address]  Chief complaint: Follow-up for  IPF Started Esbriet September 7157  HPI: 79 year old with history of hypertension, stroke, sleep apnea. Referred for evaluation of interstitial lung disease This was noted on CT scan done in 2018.  Not on any treatment Notes increasing cough, dyspnea over the past few months.  Symptoms worsen after COVID-19 vaccination.  No mucus production, wheezing or chills.  She had an exposure to DDT as a child ( middle School) and had severe respiratory problems ever since, including multiple flu illnesses that would take her 6-8 weeks to recover from.  Pets: No pets Occupation: Retired Quarry manager at [employer] Exposures: Reports exposure to DDD as a child and to acids in her lab at work but no known ongoing exposures.  No mold, hot tub, Jacuzzi.  No asbestos, down pillows or comforters Smoking history: 5-pack-year smoker.  Quit in 1971 Travel history: No significant travel history Relevant family history: Grandfather had emphysema.  He was a pipe smoker  Interim history: Continues on Esbriet.  She is on full dose now and is tolerating it well without any issues  Recently started on supplemental oxygen during exertion. Continues on CPAP at home.  Remains active with hiking and water aerobics.  Outpatient Encounter Medications as of 12/08/2020  Medication Sig  . albuterol (PROAIR HFA) 108 (90 Base) MCG/ACT inhaler INHALE 2 PUFFS INTO THE LUNCH EVERY 6 HOURS AS NEEDED FOR WHEEZING OR SHORTNESS OF BREATH  . amLODipine (NORVASC) 5 MG tablet Take 1 tablet (5 mg total) by mouth daily.  Marland Kitchen b complex vitamins tablet Take 1 tablet by mouth daily.    . budesonide-formoterol (SYMBICORT) 160-4.5 MCG/ACT inhaler Inhale 2 puffs into the lungs 2 (two) times daily.  Marland Kitchen CALCIUM PO  Take by mouth daily.    . Cholecalciferol (VITAMIN D-3 PO) Take by mouth daily.    . clopidogrel (PLAVIX) 75 MG tablet Take 1 tablet (75 mg total) by mouth daily.  Marland Kitchen co-enzyme Q-10 30 MG capsule Take 30 mg by mouth 3 (three) times daily.  . Collagen Hydrolysate POWD 1 Scoop by Does not apply route daily.  . ESBRIET 267 MG CAPS TAKE 3 CAPSULES BY MOUTH WITH BREAKFAST, WITH LUNCH, AND WITH EVENING MEAL.  . ferrous sulfate 324 MG TBEC Take 324 mg by mouth every other day.  . hydrochlorothiazide (MICROZIDE) 12.5 MG capsule Take 1 capsule (12.5 mg total) by mouth daily.  Marland Kitchen levocetirizine (XYZAL) 5 MG tablet Take 1 tablet (5 mg total) by mouth every evening.  . LUTEIN PO Take by mouth daily.  . montelukast (SINGULAIR) 10 MG tablet Take 1 tablet (10 mg total) by mouth at bedtime.  . Multiple Vitamins-Minerals (WOMENS MULTIVITAMIN PO) Take 1 capsule by mouth daily.  Marland Kitchen oxybutynin (DITROPAN-XL) 10 MG 24 hr tablet TAKE 1 TABLET BY MOUTH Q DAILY  . pantoprazole (PROTONIX) 40 MG tablet Take 1 tablet (40 mg total) by mouth 2 (two) times daily.  . potassium chloride (KLOR-CON) 10 MEQ tablet Take 1 tablet (10 mEq total) by mouth daily.  . simvastatin (ZOCOR) 10 MG tablet TAKE 1 TABLET EVERY DAY  AT  6  PM  . trimethoprim (TRIMPEX) 100 MG tablet Take 100 mg by mouth daily.   . Turmeric (QC TUMERIC COMPLEX) 500 MG  CAPS Take 1 capsule by mouth daily.  Marland Kitchen zinc gluconate 50 MG tablet Take 50 mg by mouth daily.   No facility-administered encounter medications on file as of 12/08/2020.   Physical Exam: Blood pressure 122/80, pulse 66, temperature 98.9 F (37.2 C), temperature source Oral, height 5' 2" (1.575 m), weight 169 lb (76.7 kg), SpO2 95 %. Gen:      No acute distress HEENT:  EOMI, sclera anicteric Neck:     No masses; no thyromegaly Lungs:    Bibasal crackles CV:         Regular rate and rhythm; no murmurs Abd:      + bowel sounds; soft, non-tender; no palpable masses, no distension Ext:    No edema;  adequate peripheral perfusion Skin:      Warm and dry; no rash Neuro: alert and oriented x 3 Psych: normal mood and affect  Data Reviewed: Imaging: High-resolution CT 03/29/2017-basal gradient fibrotic changes with reticulation, traction bronchiectasis.  Moderate air trapping.  Probable UIP pattern.  PFTs: 04/17/2017 FVC 1.69 [64%], FEV1 1.45 [73%], F/F 86, TLC 3.72 [76%], DLCO 14.90 [65%]  06/17/2020 FVC 1.72 [68%], FEV1 1.44 [76%], F/F 84, TLC 3.26 [66%], DLCO 14.53 [79%]  Moderate restriction with diffusion defect.  Slightly worsened compared to 2018  6-minute walk test 06/30/2020- 443 m, nadir O2 sat of 90%  Labs: RAST panel 04/27/2015-IgE 40, sensitive to grass, tree pollen CBC 02/18/2018-WBC 6.2, eos 3.9%, absolute eosinophil count 242  N-terminal proBNP 09/10/2019 1-24  ILD serologies 04/09/2020-atypical p-ANCA 1:80  Sleep: PSG 07/31/17, AHI 6.4, SpO2 low 85%. CPAP titration 11/15/2017 CPAP 7  Assessment:  Pulmonary fibrosis Probable UIP pattern.  This is likely IPF with minimal exposures.  Serologies show mild elevation in ANCA which is likely nonspecific with no other signs and symptoms of autoimmune process.  Discussed findings in detail.  To make a diagnosis with certainty we would need a lung biopsy but not recommended given her age. Given mild progression on CT scan and PFTs with worsening symptoms we have decided to treat with antifibrotic Continue Esbriet LFTs are slightly elevated.  Continue monitoring labs  Continue supplemental oxygen with portable concentrator Referred to rehab  Mild pulmonary hypertension Noted on echocardiogram.  Patient and daughter would like to hold off on additional work-up such as right heart cath or tyvaso.  proBNP is normal We will reassess in 3 to 6 months  Chronic cough Secondary to pulmonary fibrosis, GERD Continue PPI, Tessalon Advised to raise head of the bed and not lay down at least 2 hours after eating  OSA Stable Download  reviewed with good compliance  Plan/Recommendations: - Continue Esbriet - Check CMP - Weight loss with diet and exercise - Supplemental oxygen - Pulmonary rehab  Marshell Garfinkel MD [facility] 12/08/2020, 9:15 AM  CC: Biagio Borg, MD

## 2020-12-09 ENCOUNTER — Encounter (HOSPITAL_COMMUNITY): Payer: Self-pay | Admitting: *Deleted

## 2020-12-09 NOTE — Progress Notes (Signed)
Received referral from Dr. Vaughan Browner for this pt to participate in pulmonary rehab with the the diagnosis of ILD. Clinical review of pt follow up appt on 12/22 Pulmonary office note.  Pt with Covid Risk Score - 6. Pt appropriate for scheduling for Pulmonary rehab.  Will forward to support staff for scheduling and verification of insurance eligibility/benefits with pt consent. Cherre Huger, BSN Cardiac and Training and development officer

## 2020-12-13 ENCOUNTER — Other Ambulatory Visit: Payer: Self-pay | Admitting: Internal Medicine

## 2020-12-27 DIAGNOSIS — J849 Interstitial pulmonary disease, unspecified: Secondary | ICD-10-CM | POA: Diagnosis not present

## 2020-12-27 DIAGNOSIS — G4733 Obstructive sleep apnea (adult) (pediatric): Secondary | ICD-10-CM | POA: Diagnosis not present

## 2020-12-28 ENCOUNTER — Telehealth (HOSPITAL_COMMUNITY): Payer: Self-pay

## 2020-12-28 MED FILL — ESBRIET 267 MG CAPSULE: 267 | 30 days supply | Qty: 270 | Fill #2

## 2020-12-28 NOTE — Telephone Encounter (Signed)
Pt insurance is active and benefits verified through [employer] $10, DED 0/0 met, out of pocket $3,900/0 met, co-insurance 0%. no pre-authorization required. Passport, [employer] 12/28/2020_0 Terrence Dupont, REF# [id number]

## 2020-12-29 ENCOUNTER — Encounter: Payer: Self-pay | Admitting: *Deleted

## 2021-01-04 ENCOUNTER — Telehealth (HOSPITAL_COMMUNITY): Payer: Self-pay | Admitting: *Deleted

## 2021-01-06 ENCOUNTER — Encounter (HOSPITAL_COMMUNITY): Payer: Self-pay

## 2021-01-06 NOTE — Telephone Encounter (Signed)
Attempted to call patient in regards to Cardiac Rehab - LM on VM Mailed letter 

## 2021-01-12 ENCOUNTER — Other Ambulatory Visit (INDEPENDENT_AMBULATORY_CARE_PROVIDER_SITE_OTHER): Payer: Medicare HMO

## 2021-01-12 DIAGNOSIS — N3946 Mixed incontinence: Secondary | ICD-10-CM | POA: Diagnosis not present

## 2021-01-12 DIAGNOSIS — J84112 Idiopathic pulmonary fibrosis: Secondary | ICD-10-CM

## 2021-01-12 DIAGNOSIS — Z5181 Encounter for therapeutic drug level monitoring: Secondary | ICD-10-CM | POA: Diagnosis not present

## 2021-01-12 LAB — COMPREHENSIVE METABOLIC PANEL
ALT: 13 U/L (ref 0–35)
AST: 16 U/L (ref 0–37)
Albumin: 4.1 g/dL (ref 3.5–5.2)
Alkaline Phosphatase: 81 U/L (ref 39–117)
BUN: 10 mg/dL (ref 6–23)
CO2: 34 mEq/L — ABNORMAL HIGH (ref 19–32)
Calcium: 9.8 mg/dL (ref 8.4–10.5)
Chloride: 93 mEq/L — ABNORMAL LOW (ref 96–112)
Creatinine, Ser: 0.71 mg/dL (ref 0.40–1.20)
GFR: 80.44 mL/min (ref >60.00)
Glucose, Bld: 85 mg/dL (ref 70–99)
Potassium: 3.8 mEq/L (ref 3.5–5.1)
Sodium: 131 mEq/L — ABNORMAL LOW (ref 135–145)
Total Bilirubin: 0.6 mg/dL (ref 0.2–1.2)
Total Protein: 6.8 g/dL (ref 6.0–8.3)

## 2021-01-15 DIAGNOSIS — G4733 Obstructive sleep apnea (adult) (pediatric): Secondary | ICD-10-CM | POA: Diagnosis not present

## 2021-01-24 ENCOUNTER — Encounter: Payer: Self-pay | Admitting: *Deleted

## 2021-01-27 DIAGNOSIS — J849 Interstitial pulmonary disease, unspecified: Secondary | ICD-10-CM | POA: Diagnosis not present

## 2021-01-27 DIAGNOSIS — G4733 Obstructive sleep apnea (adult) (pediatric): Secondary | ICD-10-CM | POA: Diagnosis not present

## 2021-02-02 IMAGING — CT CT CHEST HIGH RESOLUTION W/O CM
2 of 7 series · 14 of 36 positions shown, 17 images · non-contrast
Comparison: 03/29/2017 high-resolution chest CT.

CLINICAL DATA: Worsening chronic cough and dyspnea for 1 year.
Follow-up interstitial lung disease.

EXAM:
CT CHEST WITHOUT CONTRAST
TECHNIQUE: Multidetector CT imaging of the chest was performed following the
standard protocol without intravenous contrast. High resolution
imaging of the lungs, as well as inspiratory and expiratory imaging,
was performed.

[Series 4: high resolution · axial · 0.60mm/px · z∈[-246,-46]mm · 11 of 240 slices shown, 14 images]
[im 20/240  mediastinal]
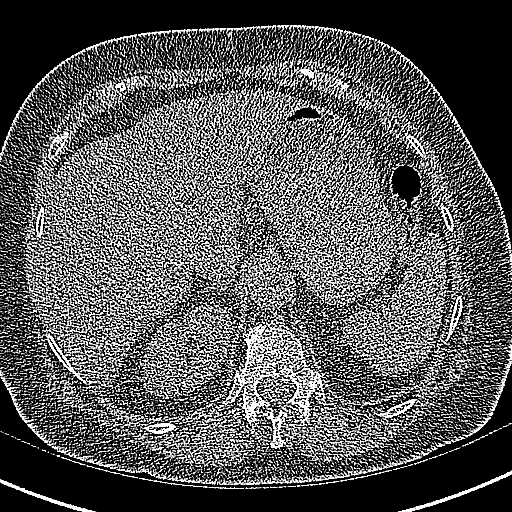
[im 20/240  lung]
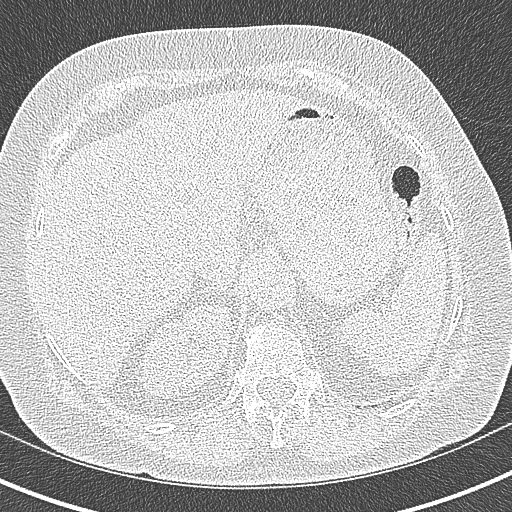
[im 40/240  lung]
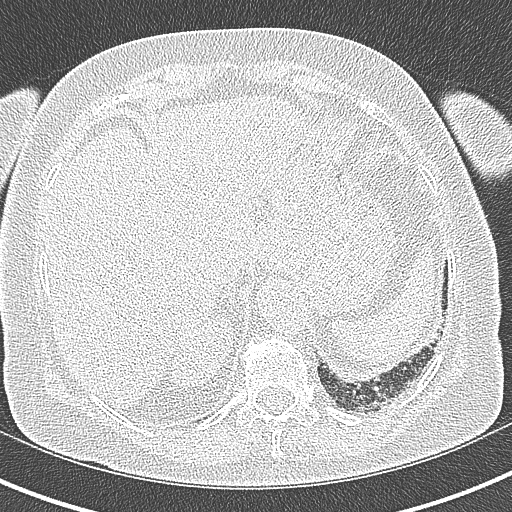
[im 60/240  lung]
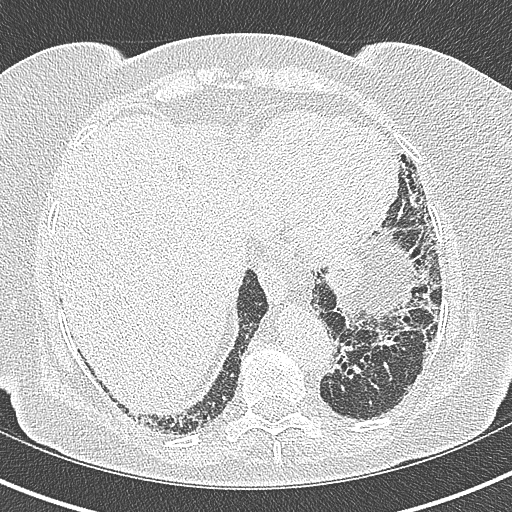
[im 80/240  lung]
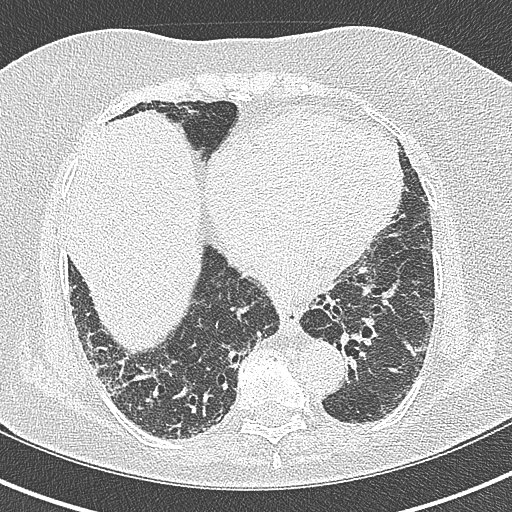
[im 100/240  mediastinal]
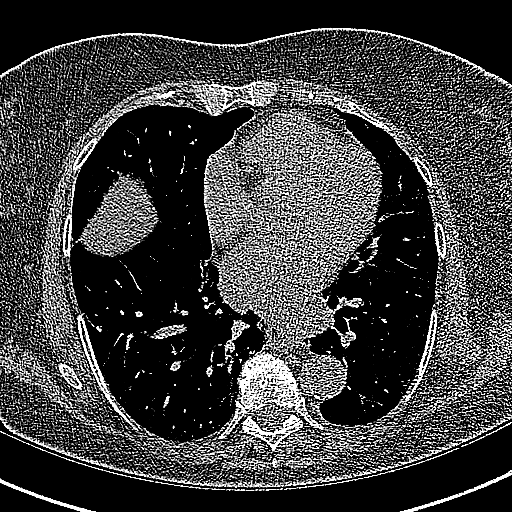
[im 100/240  lung]
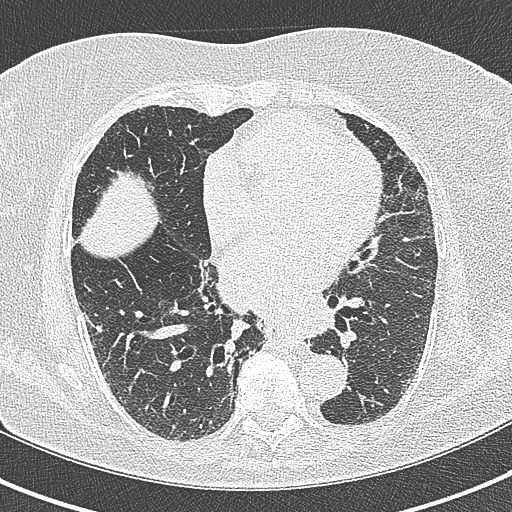
[im 120/240  lung]
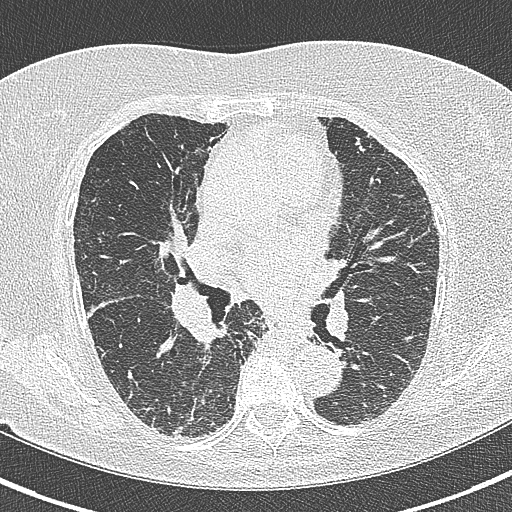
[im 140/240  lung]
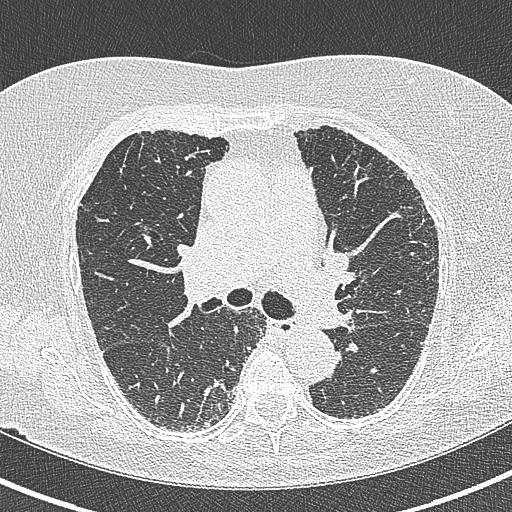
[im 160/240  lung]
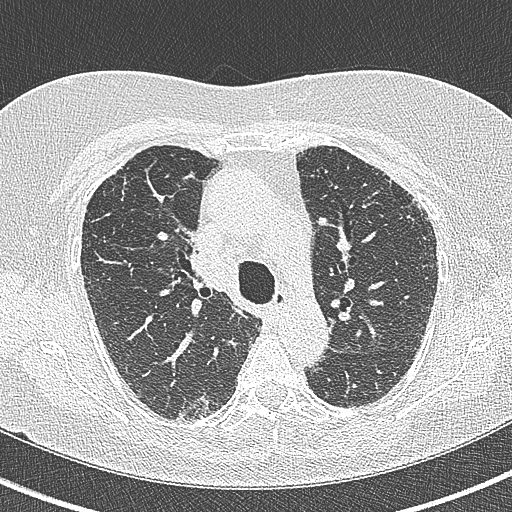
[im 180/240  mediastinal]
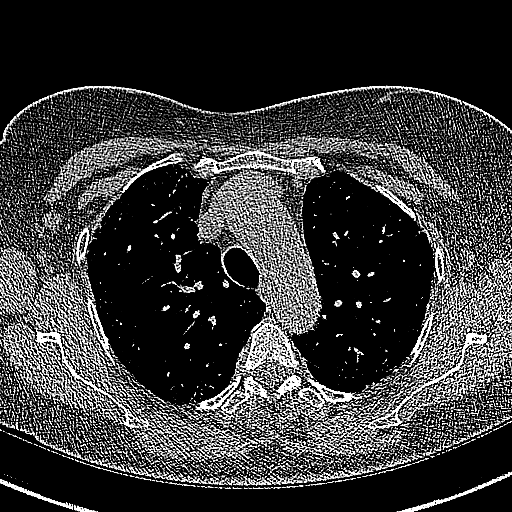
[im 180/240  lung]
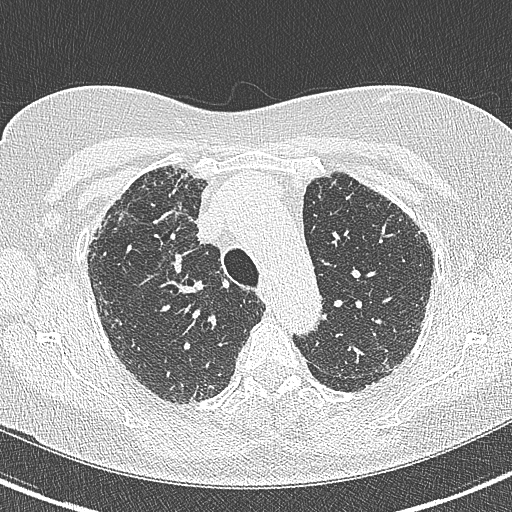
[im 200/240  lung]
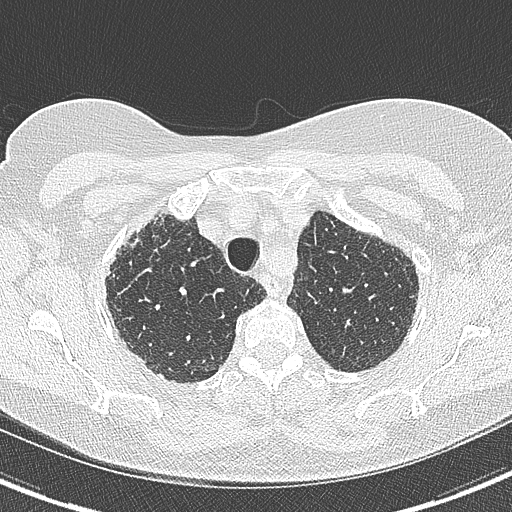
[im 220/240  lung]
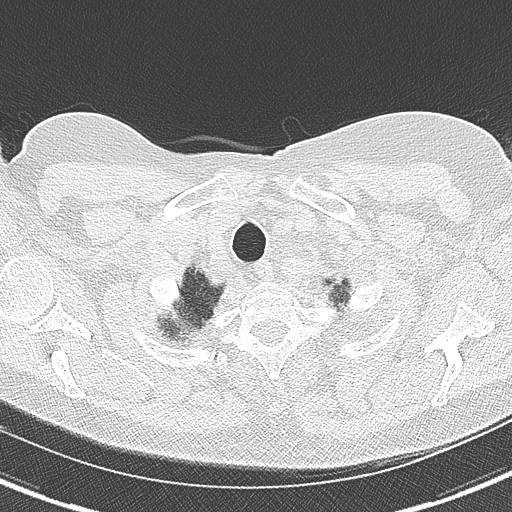

[Series 6: coronal · coronal · 0.51mm/px · 3 of 121 slices shown]
[im 25/121  lung]
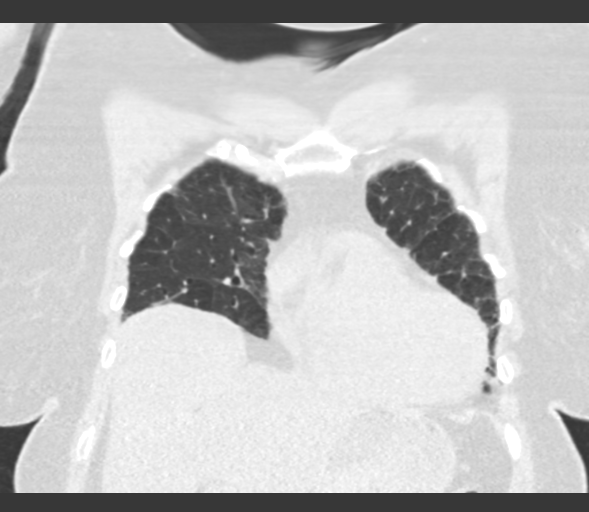
[im 49/121  lung]
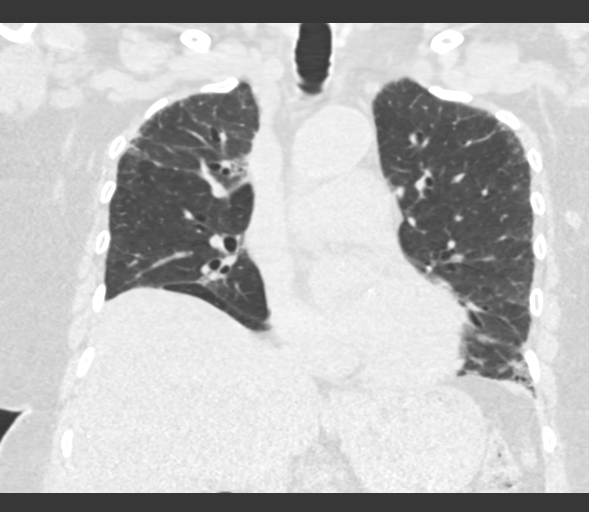
[im 73/121  lung]
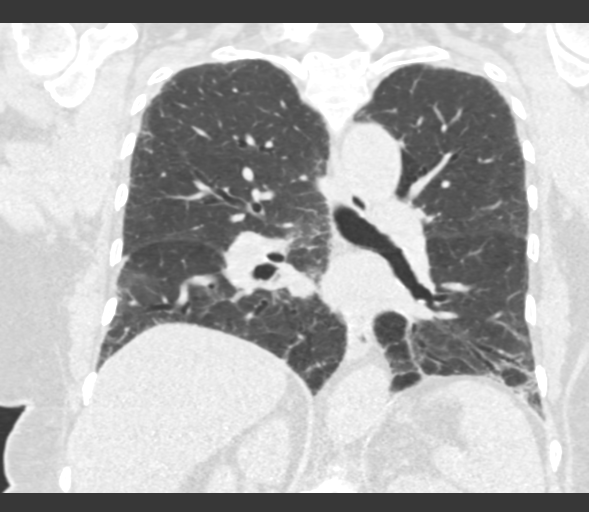

[14 of 36 positions shown; findings below may reference images not displayed]

FINDINGS: Cardiovascular: Normal heart size. No significant pericardial
effusion/thickening. Atherosclerotic nonaneurysmal thoracic aorta.
Top-normal caliber main pulmonary artery (3.4 cm diameter).

Mediastinum/Nodes: Hypodense 1.5 cm left thyroid nodule, unchanged.
Unremarkable esophagus. New mild right axillary adenopathy measuring
up to 1.1 cm (series 2/image 33). No left axillary adenopathy. No
pathologically enlarged mediastinal or discrete hilar lymph nodes on
this noncontrast scan.

Lungs/Pleura: No pneumothorax. No pleural effusion. No acute
consolidative airspace disease or lung masses. Subcentimeter
perifissural nodules along the left major fissure stable and
considered benign. No new significant pulmonary nodules. No
significant air trapping or evidence of tracheobronchomalacia on the
expiration sequence. There is moderate patchy confluent subpleural
reticulation and ground-glass opacity throughout both lungs with
associated moderate traction bronchiectasis and architectural
distortion. No frank honeycombing. There is a mild basilar
predominance to these findings. Findings have progressed mildly
since 03/29/[address] chest CT.

Upper abdomen: Small hiatal hernia. Diffuse hepatic steatosis.
Colonic diverticulosis.

Musculoskeletal: No aggressive appearing focal osseous lesions.
Moderate thoracic spondylosis.
IMPRESSION: 1. Spectrum of findings compatible with basilar predominant fibrotic
interstitial lung disease without frank honeycombing, mildly
progressed since 03/29/[address] chest CT. Findings are categorized as
probable UIP per consensus guidelines: Diagnosis of Idiopathic
Pulmonary Fibrosis: An Official ATS/ERS/JRS/ALAT Clinical Practice
Guideline. Am J Respir Crit Care Med Vol 198, [place] 5, [id number][DATE]. New mild right axillary adenopathy, nonspecific. Correlate for
any history of recent [id number] vaccination to explain this finding.
If there is no such history, suggest follow-up chest CT with IV
contrast in 3-6 months.
3. Small hiatal hernia.
4. Diffuse hepatic steatosis.
5. Aortic Atherosclerosis ([id number]).

## 2021-02-14 MED FILL — ESBRIET 267 MG CAPSULE: 267 | 30 days supply | Qty: 270 | Fill #3

## 2021-02-17 ENCOUNTER — Telehealth (HOSPITAL_COMMUNITY): Payer: Self-pay | Admitting: *Deleted

## 2021-02-18 ENCOUNTER — Other Ambulatory Visit: Payer: Self-pay

## 2021-02-18 ENCOUNTER — Encounter (HOSPITAL_COMMUNITY)
Admission: RE | Admit: 2021-02-18 | Discharge: 2021-02-18 | Disposition: A | Discharge status: Home / Self Care | Payer: Medicare HMO | Source: Ambulatory Visit | Attending: Pulmonary Disease | Admitting: Pulmonary Disease

## 2021-02-18 ENCOUNTER — Encounter (HOSPITAL_COMMUNITY): Payer: Self-pay

## 2021-02-18 VITALS — BP 122/74 | HR 80 | Ht 62.0 in | Wt 168.9 lb

## 2021-02-18 DIAGNOSIS — J84112 Idiopathic pulmonary fibrosis: Secondary | ICD-10-CM | POA: Diagnosis not present

## 2021-02-18 HISTORY — DX: Cerebral infarction, unspecified: I63.9

## 2021-02-18 NOTE — Progress Notes (Signed)
Pulmonary Individual Treatment Plan  Patient Details  Name: Maria [name] MRN: [id number] Date of Birth: Apr 26, 1941 Referring Provider:   April Manson Pulmonary Rehab Walk Test from 02/18/2021 in [place]  Referring Provider Dr. Vaughan Browner      Initial Encounter Date:  Flowsheet Row Pulmonary Rehab Walk Test from 02/18/2021 in [place]  Date 02/18/21      Visit Diagnosis: IPF (idiopathic pulmonary fibrosis) ([place])  Patient's Home Medications on Admission:   Current Outpatient Medications:  .  albuterol (PROAIR HFA) 108 (90 Base) MCG/ACT inhaler, INHALE 2 PUFFS INTO THE LUNCH EVERY 6 HOURS AS NEEDED FOR WHEEZING OR SHORTNESS OF BREATH, Disp: 18 g, Rfl: 2 .  amLODipine (NORVASC) 5 MG tablet, TAKE 1 TABLET EVERY DAY, Disp: 90 tablet, Rfl: 2 .  b complex vitamins tablet, Take 1 tablet by mouth daily.  , Disp: , Rfl:  .  budesonide-formoterol (SYMBICORT) 160-4.5 MCG/ACT inhaler, Inhale 2 puffs into the lungs 2 (two) times daily. (Patient not taking: Reported on 02/18/2021), Disp: 1 Inhaler, Rfl: 3 .  CALCIUM PO, Take by mouth daily.  , Disp: , Rfl:  .  Cholecalciferol (VITAMIN D-3 PO), Take by mouth daily.  , Disp: , Rfl:  .  clopidogrel (PLAVIX) 75 MG tablet, TAKE 1 TABLET EVERY DAY, Disp: 90 tablet, Rfl: 2 .  co-enzyme Q-10 30 MG capsule, Take 30 mg by mouth 3 (three) times daily., Disp: , Rfl:  .  Collagen Hydrolysate POWD, 1 Scoop by Does not apply route daily., Disp: , Rfl:  .  ESBRIET 267 MG CAPS, TAKE 3 CAPSULES BY MOUTH WITH BREAKFAST, WITH LUNCH, AND WITH EVENING MEAL., Disp: 270 capsule, Rfl: 11 .  ferrous sulfate 324 MG TBEC, Take 324 mg by mouth every other day., Disp: , Rfl:  .  hydrochlorothiazide (MICROZIDE) 12.5 MG capsule, TAKE 1 CAPSULE EVERY DAY, Disp: 90 capsule, Rfl: 2 .  levocetirizine (XYZAL) 5 MG tablet, TAKE 1 TABLET EVERY EVENING, Disp: 90 tablet, Rfl: 3 .  LUTEIN PO, Take by mouth daily., Disp: ,  Rfl:  .  montelukast (SINGULAIR) 10 MG tablet, TAKE 1 TABLET AT BEDTIME, Disp: 90 tablet, Rfl: 2 .  Multiple Vitamins-Minerals (WOMENS MULTIVITAMIN PO), Take 1 capsule by mouth daily., Disp: , Rfl:  .  oxybutynin (DITROPAN-XL) 10 MG 24 hr tablet, TAKE 1 TABLET BY MOUTH Q DAILY, Disp: 90 tablet, Rfl: 3 .  pantoprazole (PROTONIX) 40 MG tablet, Take 1 tablet (40 mg total) by mouth 2 (two) times daily., Disp: 180 tablet, Rfl: 2 .  potassium chloride (KLOR-CON) 10 MEQ tablet, TAKE 1 TABLET EVERY DAY, Disp: 90 tablet, Rfl: 2 .  simvastatin (ZOCOR) 10 MG tablet, TAKE 1 TABLET EVERY DAY  AT  6  PM, Disp: 90 tablet, Rfl: 2 .  trimethoprim (TRIMPEX) 100 MG tablet, Take 100 mg by mouth daily. , Disp: , Rfl:  .  Turmeric (QC TUMERIC COMPLEX) 500 MG CAPS, Take 1 capsule by mouth daily., Disp: , Rfl:  .  zinc gluconate 50 MG tablet, Take 50 mg by mouth daily., Disp: , Rfl:   Past Medical History: Past Medical History:  Diagnosis Date  . Anemia   . Arthritis   . Bronchitis   . Carpal tunnel syndrome    in calves neuropathy  . Diverticulosis   . GERD (gastroesophageal reflux disease)   . Headache(784.0)    chronic  . High cholesterol   . Hyperlipidemia   . Hypertension   .  Migraine   . Nephritis   . OSA (obstructive sleep apnea)   . Palpitations   . Personal history of colonic adenomas 09/15/2010  . Stroke ([place])   . TIA (transient ischemic attack)   . Vertigo, benign paroxysmal     Tobacco Use: Social History   Tobacco Use  Smoking Status Former Smoker  . Packs/day: 1.00  . Years: 10.00  . Pack years: 10.00  . Types: Cigarettes  . Quit date: 12/18/1969  . Years since quitting: 51.2  Smokeless Tobacco Never Used    Labs: Recent Chemical engineer    Labs for ITP Cardiac and Pulmonary Rehab Latest Ref Rng & Units 10/23/2013 11/10/2014 12/01/2016 02/18/2018 04/16/2020   Cholestrol 0 - 200 mg/dL 202(H) 154 137 - 137   LDLCALC 0 - 99 mg/dL - 80 66 - 56   LDLDIRECT mg/dL 113.2 - - - -    HDL >39.00 mg/dL 73.10 59.60 60.30 - 59.50   Trlycerides 0.0 - 149.0 mg/dL 102.0 74.0 52.0 - 107.0   Hemoglobin A1c 4.6 - 6.5 % 5.8 6.2 5.8 5.9 5.7      Capillary Blood Glucose: Lab Results  Component Value Date   GLUCAP 99 02/01/2011     Pulmonary Assessment Scores:  Pulmonary Assessment Scores    Row Name 02/18/21 1350         ADL UCSD   ADL Phase Entry     SOB Score total 72           CAT Score   CAT Score 22           mMRC Score   mMRC Score 4           UCSD: Self-administered rating of dyspnea associated with activities of daily living (ADLs) 6-point scale (0 = "not at all" to 5 = "maximal or unable to do because of breathlessness")  Scoring Scores range from 0 to 120.  Minimally important difference is 5 units  CAT: CAT can identify the health impairment of COPD patients and is better correlated with disease progression.  CAT has a scoring range of zero to 40. The CAT score is classified into four groups of low (less than 10), medium (10 - 20), high (21-30) and very high (31-40) based on the impact level of disease on health status. A CAT score over 10 suggests significant symptoms.  A worsening CAT score could be explained by an exacerbation, poor medication adherence, poor inhaler technique, or progression of COPD or comorbid conditions.  CAT MCID is 2 points  mMRC: mMRC (Modified Medical Research Council) Dyspnea Scale is used to assess the degree of baseline functional disability in patients of respiratory disease due to dyspnea. No minimal important difference is established. A decrease in score of 1 point or greater is considered a positive change.   Pulmonary Function Assessment:  Pulmonary Function Assessment - 02/18/21 0946      Breath   Shortness of Breath Yes;Limiting activity           Exercise Target Goals: Exercise Program Goal: Individual exercise prescription set using results from initial 6 min walk test and THRR while considering   patient's activity barriers and safety.   Exercise Prescription Goal: Initial exercise prescription builds to 30-45 minutes a day of aerobic activity, 2-3 days per week.  Home exercise guidelines will be given to patient during program as part of exercise prescription that the participant will acknowledge.  Activity Barriers & Risk Stratification:  Activity Barriers &  Cardiac Risk Stratification - 02/18/21 0942      Activity Barriers & Cardiac Risk Stratification   Activity Barriers Back Problems;Deconditioning;Shortness of Breath;Left Knee Replacement;Right Knee Replacement           6 Minute Walk:  6 Minute Walk    Row Name 02/18/21 1217         6 Minute Walk   Phase Initial     Distance 1348 feet     Walk Time 6 minutes     # of Rest Breaks 0     MPH 2.55     METS 2.39     RPE 8     Perceived Dyspnea  1     VO2 Peak 8.35     Symptoms No     Resting HR 59 bpm     Resting BP 128/64     Resting Oxygen Saturation  99 %     Exercise Oxygen Saturation  during 6 min walk 94 %     Max Ex. HR 105 bpm     Max Ex. BP 152/78     2 Minute Post BP 122/74           Interval HR   1 Minute HR 96     2 Minute HR 98     3 Minute HR 100     4 Minute HR 99     5 Minute HR 105     6 Minute HR 105     2 Minute Post HR 72     Interval Heart Rate? Yes           Interval Oxygen   Interval Oxygen? Yes     Baseline Oxygen Saturation % 99 %     1 Minute Oxygen Saturation % 94 %     1 Minute Liters of Oxygen 4 L     2 Minute Oxygen Saturation % 99 %     2 Minute Liters of Oxygen 4 L     3 Minute Oxygen Saturation % 100 %     3 Minute Liters of Oxygen 4 L     4 Minute Oxygen Saturation % 98 %     4 Minute Liters of Oxygen 4 L     5 Minute Oxygen Saturation % 97 %     5 Minute Liters of Oxygen 4 L     6 Minute Oxygen Saturation % 97 %     6 Minute Liters of Oxygen 4 L     2 Minute Post Oxygen Saturation % 99 %     2 Minute Post Liters of Oxygen 4 L            Oxygen  Initial Assessment:  Oxygen Initial Assessment - 02/18/21 0945      Home Oxygen   Home Oxygen Device Portable Concentrator;Home Concentrator    Sleep Oxygen Prescription CPAP    Home Exercise Oxygen Prescription Continuous    Liters per minute 4    Home Resting Oxygen Prescription Continuous    Liters per minute 4    Compliance with Home Oxygen Use No    Comments --   Does not wear oxygen at rest like she is prescribed     Initial 6 min Walk   Oxygen Used Continuous    Liters per minute 4      Program Oxygen Prescription   Program Oxygen Prescription Continuous    Liters per minute 4  Intervention   Short Term Goals To learn and understand importance of monitoring SPO2 with pulse oximeter and demonstrate accurate use of the pulse oximeter.;To learn and understand importance of maintaining oxygen saturations>88%;To learn and demonstrate proper pursed lip breathing techniques or other breathing techniques.;To learn and demonstrate proper use of respiratory medications;To learn and exhibit compliance with exercise, home and travel O2 prescription    Long  Term Goals Exhibits compliance with exercise, home and travel O2 prescription;Verbalizes importance of monitoring SPO2 with pulse oximeter and return demonstration;Maintenance of O2 saturations>88%;Exhibits proper breathing techniques, such as pursed lip breathing or other method taught during program session;Compliance with respiratory medication;Demonstrates proper use of MDI's           Oxygen Re-Evaluation:   Oxygen Discharge (Final Oxygen Re-Evaluation):   Initial Exercise Prescription:  Initial Exercise Prescription - 02/18/21 1200      Date of Initial Exercise RX and Referring Provider   Date 02/18/21    Referring Provider Dr. Vaughan Browner    Expected Discharge Date 04/21/21      Oxygen   Oxygen Continuous    Liters 4      Treadmill   MPH 2    Grade 0    Minutes 15      NuStep   Level 2    SPM 80    Minutes  15      Prescription Details   Frequency (times per week) 2    Duration Progress to 30 minutes of continuous aerobic without signs/symptoms of physical distress      Intensity   THRR 40-80% of Max Heartrate 56-112    Ratings of Perceived Exertion 11-13    Perceived Dyspnea 0-4      Progression   Progression Continue to progress workloads to maintain intensity without signs/symptoms of physical distress.      Resistance Training   Training Prescription Yes    Weight orange bands    Reps 10-15           Perform Capillary Blood Glucose checks as needed.  Exercise Prescription Changes:   Exercise Comments:   Exercise Goals and Review:  Exercise Goals    Row Name 02/18/21 1210             Exercise Goals   Increase Physical Activity Yes       Intervention Provide advice, education, support and counseling about physical activity/exercise needs.;Develop an individualized exercise prescription for aerobic and resistive training based on initial evaluation findings, risk stratification, comorbidities and participant's personal goals.       Expected Outcomes Short Term: Attend rehab on a regular basis to increase amount of physical activity.;Long Term: Add in home exercise to make exercise part of routine and to increase amount of physical activity.;Long Term: Exercising regularly at least 3-5 days a week.       Increase Strength and Stamina Yes       Intervention Provide advice, education, support and counseling about physical activity/exercise needs.;Develop an individualized exercise prescription for aerobic and resistive training based on initial evaluation findings, risk stratification, comorbidities and participant's personal goals.       Expected Outcomes Short Term: Increase workloads from initial exercise prescription for resistance, speed, and METs.;Short Term: Perform resistance training exercises routinely during rehab and add in resistance training at home;Long Term:  Improve cardiorespiratory fitness, muscular endurance and strength as measured by increased METs and functional capacity (6MWT)       Able to understand and use rate of perceived  exertion (RPE) scale Yes       Intervention Provide education and explanation on how to use RPE scale       Expected Outcomes Short Term: Able to use RPE daily in rehab to express subjective intensity level;Long Term:  Able to use RPE to guide intensity level when exercising independently       Able to understand and use Dyspnea scale Yes       Intervention Provide education and explanation on how to use Dyspnea scale       Expected Outcomes Short Term: Able to use Dyspnea scale daily in rehab to express subjective sense of shortness of breath during exertion;Long Term: Able to use Dyspnea scale to guide intensity level when exercising independently       Knowledge and understanding of Target Heart Rate Range (THRR) Yes       Intervention Provide education and explanation of THRR including how the numbers were predicted and where they are located for reference       Expected Outcomes Short Term: Able to state/look up THRR;Long Term: Able to use THRR to govern intensity when exercising independently;Short Term: Able to use daily as guideline for intensity in rehab       Understanding of Exercise Prescription Yes       Intervention Provide education, explanation, and written materials on patient's individual exercise prescription       Expected Outcomes Short Term: Able to explain program exercise prescription;Long Term: Able to explain home exercise prescription to exercise independently              Exercise Goals Re-Evaluation :   Discharge Exercise Prescription (Final Exercise Prescription Changes):   Nutrition:  Target Goals: Understanding of nutrition guidelines, daily intake of sodium <1582m, cholesterol <2046m calories 30% from fat and 7% or less from saturated fats, daily to have 5 or more servings of  fruits and vegetables.  Biometrics:  Pre Biometrics - 02/18/21 1211      Pre Biometrics   Grip Strength 24 kg            Nutrition Therapy Plan and Nutrition Goals:   Nutrition Assessments:  MEDIFICTS Score Key:  ?70 Need to make dietary changes   40-70 Heart Healthy Diet  ? 40 Therapeutic Level Cholesterol Diet   Picture Your Plate Scores:  <4<10nhealthy dietary pattern with much room for improvement.  41-50 Dietary pattern unlikely to meet recommendations for good health and room for improvement.  51-60 More healthful dietary pattern, with some room for improvement.   >60 Healthy dietary pattern, although there may be some specific behaviors that could be improved.    Nutrition Goals Re-Evaluation:   Nutrition Goals Discharge (Final Nutrition Goals Re-Evaluation):   Psychosocial: Target Goals: Acknowledge presence or absence of significant depression and/or stress, maximize coping skills, provide positive support system. Participant is able to verbalize types and ability to use techniques and skills needed for reducing stress and depression.  Initial Review & Psychosocial Screening:  Initial Psych Review & Screening - 02/18/21 0948      Initial Review   Current issues with None Identified      Family Dynamics   Good Support System? Yes   Friends and Bible study     Barriers   Psychosocial barriers to participate in program The patient should benefit from training in stress management and relaxation.      Screening Interventions   Interventions Encouraged to exercise  Quality of Life Scores:  Scores of 19 and below usually indicate a poorer quality of life in these areas.  A difference of  2-3 points is a clinically meaningful difference.  A difference of 2-3 points in the total score of the Quality of Life Index has been associated with significant improvement in overall quality of life, self-image, physical symptoms, and general health  in studies assessing change in quality of life.  PHQ-9: Recent Review Flowsheet Data    Depression screen [facility] 2/9 02/18/2021 08/25/2020 08/05/2020 01/23/2020 08/08/2019   Decreased Interest 0 0 0 0 0   Down, Depressed, Hopeless 0 0 0 1 0   PHQ - 2 Score 0 0 0 1 0   Altered sleeping 1 - - - -   Tired, decreased energy 0 - - - -   Change in appetite 0 - - - -   Feeling bad or failure about yourself  0 - - - -   Trouble concentrating 0 - - - -   Moving slowly or fidgety/restless 0 - - - -   Suicidal thoughts 0 - - - -   Difficult doing work/chores Not difficult at all - - - -     Interpretation of Total Score  Total Score Depression Severity:  1-4 = Minimal depression, 5-9 = Mild depression, 10-14 = Moderate depression, 15-19 = Moderately severe depression, 20-27 = Severe depression   Psychosocial Evaluation and Intervention:  Psychosocial Evaluation - 02/18/21 1212      Psychosocial Evaluation & Interventions   Interventions Encouraged to exercise with the program and follow exercise prescription    Continue Psychosocial Services  No Follow up required           Psychosocial Re-Evaluation:   Psychosocial Discharge (Final Psychosocial Re-Evaluation):   Education: Education Goals: Education classes will be provided on a weekly basis, covering required topics. Participant will state understanding/return demonstration of topics presented.  Learning Barriers/Preferences:  Learning Barriers/Preferences - 02/18/21 0949      Learning Barriers/Preferences   Learning Barriers None    Learning Preferences Written Material;Skilled Demonstration;Individual Instruction           Education Topics: Risk Factor Reduction:  -Group instruction that is supported by a PowerPoint presentation. Instructor discusses the definition of a risk factor, different risk factors for pulmonary disease, and how the heart and lungs work together.     Nutrition for Pulmonary Patient:  -Group instruction  provided by PowerPoint slides, verbal discussion, and written materials to support subject matter. The instructor gives an explanation and review of healthy diet recommendations, which includes a discussion on weight management, recommendations for fruit and vegetable consumption, as well as protein, fluid, caffeine, fiber, sodium, sugar, and alcohol. Tips for eating when patients are short of breath are discussed.   Pursed Lip Breathing:  -Group instruction that is supported by demonstration and informational handouts. Instructor discusses the benefits of pursed lip and diaphragmatic breathing and detailed demonstration on how to preform both.     Oxygen Safety:  -Group instruction provided by PowerPoint, verbal discussion, and written material to support subject matter. There is an overview of "What is Oxygen" and "Why do we need it".  Instructor also reviews how to create a safe environment for oxygen use, the importance of using oxygen as prescribed, and the risks of noncompliance. There is a brief discussion on traveling with oxygen and resources the patient may utilize.   Oxygen Equipment:  -Group instruction provided by [facility] Staff  utilizing handouts, written materials, and equipment demonstrations.   Signs and Symptoms:  -Group instruction provided by written material and verbal discussion to support subject matter. Warning signs and symptoms of infection, stroke, and heart attack are reviewed and when to call the physician/911 reinforced. Tips for preventing the spread of infection discussed.   Advanced Directives:  -Group instruction provided by verbal instruction and written material to support subject matter. Instructor reviews Advanced Directive laws and proper instruction for filling out document.   Pulmonary Video:  -Group video education that reviews the importance of medication and oxygen compliance, exercise, good nutrition, pulmonary hygiene, and pursed lip and  diaphragmatic breathing for the pulmonary patient.   Exercise for the Pulmonary Patient:  -Group instruction that is supported by a PowerPoint presentation. Instructor discusses benefits of exercise, core components of exercise, frequency, duration, and intensity of an exercise routine, importance of utilizing pulse oximetry during exercise, safety while exercising, and options of places to exercise outside of rehab.     Pulmonary Medications:  -Verbally interactive group education provided by instructor with focus on inhaled medications and proper administration.   Anatomy and Physiology of the Respiratory System and Intimacy:  -Group instruction provided by PowerPoint, verbal discussion, and written material to support subject matter. Instructor reviews respiratory cycle and anatomical components of the respiratory system and their functions. Instructor also reviews differences in obstructive and restrictive respiratory diseases with examples of each. Intimacy, Sex, and Sexuality differences are reviewed with a discussion on how relationships can change when diagnosed with pulmonary disease. Common sexual concerns are reviewed.   MD DAY -A group question and answer session with a medical doctor that allows participants to ask questions that relate to their pulmonary disease state.   OTHER EDUCATION -Group or individual verbal, written, or video instructions that support the educational goals of the pulmonary rehab program.   Holiday Eating Survival Tips:  -Group instruction provided by PowerPoint slides, verbal discussion, and written materials to support subject matter. The instructor gives patients tips, tricks, and techniques to help them not only survive but enjoy the holidays despite the onslaught of food that accompanies the holidays.   Knowledge Questionnaire Score:   Core Components/Risk Factors/Patient Goals at Admission:  Personal Goals and Risk Factors at Admission -  02/18/21 1212      Core Components/Risk Factors/Patient Goals on Admission    Weight Management Yes;Weight Loss    Expected Outcomes Weight Loss: Understanding of general recommendations for a balanced deficit meal plan, which promotes 1-2 lb weight loss per week and includes a negative energy balance of [phone] kcal/d;Long Term: Adherence to nutrition and physical activity/exercise program aimed toward attainment of established weight goal    Improve shortness of breath with ADL's Yes    Intervention Provide education, individualized exercise plan and daily activity instruction to help decrease symptoms of SOB with activities of daily living.    Expected Outcomes Short Term: Improve cardiorespiratory fitness to achieve a reduction of symptoms when performing ADLs;Long Term: Be able to perform more ADLs without symptoms or delay the onset of symptoms           Core Components/Risk Factors/Patient Goals Review:    Core Components/Risk Factors/Patient Goals at Discharge (Final Review):    ITP Comments:   Comments:

## 2021-02-18 NOTE — Progress Notes (Signed)
Maria [name] 80 y.o. female Pulmonary Rehab Orientation Note This patient who was referred to Pulmonary rehab by Dr. Vaughan Browner with the diagnosis of Idiopathic Pulmonary Fibrosis arrived today in Cardiac and Pulmonary Rehab. She arrived ambulatory with steady/normal gait. She does carry portable oxygen. However, she did not have her oxygen on but had her portable concentrator with her. [employer] is the provider for their DME. Per pt, she uses oxygen intermittently. Pt states she is prescribed 4L with exertion and 2L at rest but does not use oxygen when she is resting. Color good, skin warm and dry. Patient is oriented to time and place. Patient's medical history, psychosocial health, and medications reviewed. Psychosocial assessment reveals pt lives alone. Pt is currently retired. Pt hobbies include gardening, line dancing and hiking. Pt reports her stress level is low. Areas of stress/anxiety include Health. Pt does not exhibit signs of depression. PHQ2/9 score 0/1. Pt shows good coping skills with positive outlook . [name] was offered emotional support and reassurance. Will continue to monitor and evaluate progress toward psychosocial goal(s) of improving quality of life. Patient reports she does take medications as prescribed. Patient states she follows a Regular diet. The patient reports that she wants to lose weight but has not been putting in any effort to do so. .Patient's weight will be monitored closely. Demonstration and practice of PLB using pulse oximeter. Patient able to return demonstration satisfactorily. Safety and hand hygiene in the exercise area reviewed with patient. Patient voices understanding of the information reviewed. Department expectations discussed with patient and achievable goals were set. The patient shows enthusiasm about attending the program and we look forward to working with this nice lady. The patient completed a 6 minute walk test today and is scheduled to begin exercise on  02/22/21.  45 minutes was spent on a variety of activities such as assessment of the patient, obtaining baseline data including height, weight, BMI, and grip strength, verifying medical history, allergies, and current medications, and teaching patient strategies for performing tasks with less respiratory effort with emphasis on pursed lip breathing.  Rick Duff MS, ACSM CEP

## 2021-02-18 NOTE — Progress Notes (Signed)
Maria [name] 80 y.o. female Pulmonary Rehab Physical Assessment  Note  Physical assessment reveals heart rate is normal, breath sounds clear to auscultation, no wheezes, rales, or rhonchi, mild-moderate rhonchi heard both lower lobes however the left worse than the right. Grip strength equal, strong. Distal pulses palpable with trace swelling left greater than right.  Pt with no complaints of abdominal discomfort or constipation.  Pt does experience diarrhea associated with taking Esbriet.. Pt is able to manage this.  Pt with bowel sound in all four quadrants.  Pt is looking forward to beginning exercise next week. Cherre Huger, BSN Cardiac and Training and development officer

## 2021-02-22 ENCOUNTER — Other Ambulatory Visit: Payer: Self-pay

## 2021-02-22 ENCOUNTER — Encounter (HOSPITAL_COMMUNITY): Admission: RE | Admit: 2021-02-22 | Payer: Medicare HMO | Source: Ambulatory Visit

## 2021-02-24 ENCOUNTER — Encounter (HOSPITAL_COMMUNITY)
Admission: RE | Admit: 2021-02-24 | Discharge: 2021-02-24 | Disposition: A | Discharge status: Home / Self Care | Payer: Medicare HMO | Source: Ambulatory Visit | Attending: Pulmonary Disease | Admitting: Pulmonary Disease

## 2021-02-24 ENCOUNTER — Other Ambulatory Visit: Payer: Self-pay

## 2021-02-24 DIAGNOSIS — G4733 Obstructive sleep apnea (adult) (pediatric): Secondary | ICD-10-CM | POA: Diagnosis not present

## 2021-02-24 DIAGNOSIS — J84112 Idiopathic pulmonary fibrosis: Secondary | ICD-10-CM

## 2021-02-24 DIAGNOSIS — J849 Interstitial pulmonary disease, unspecified: Secondary | ICD-10-CM | POA: Diagnosis not present

## 2021-02-24 NOTE — Progress Notes (Signed)
Daily Session Note  Patient Details  Name: CEIRA HOESCHEN MRN: [id number] Date of Birth: February 10, 1941 Referring Provider:   April Manson Pulmonary Rehab Walk Test from 02/18/2021 in [place]  Referring Provider Dr. Vaughan Browner      Encounter Date: 02/24/2021  Check In:  Session Check In - 02/24/21 1142      Check-In   Supervising physician immediately available to respond to emergencies [facility] immediately available    Physician(s) Dr. Arbutus Ped    Location [facility]    Staff Present Rosebud Poles, RN, BSN;Lisa Ysidro Evert, RN;Jessica Hassell Done, MS, ACSM-CEP, Exercise Physiologist    Virtual Visit No    Medication changes reported     No    Fall or balance concerns reported    No    Tobacco Cessation No Change    Warm-up and Cool-down Performed on first and last piece of equipment    Resistance Training Performed Yes    VAD Patient? No    PAD/SET Patient? No      Pain Assessment   Currently in Pain? No/denies    Pain Score 0-No pain    Multiple Pain Sites No           Capillary Blood Glucose: No results found for this or any previous visit (from the past 24 hour(s)).    Social History   Tobacco Use  Smoking Status Former Smoker  . Packs/day: 1.00  . Years: 10.00  . Pack years: 10.00  . Types: Cigarettes  . Quit date: 12/18/1969  . Years since quitting: 51.2  Smokeless Tobacco Never Used    Goals Met:  Proper associated with RPD/PD & O2 Sat Exercise tolerated well Strength training completed today  Goals Unmet:  Not Applicable  Comments: Service time is from 1055 to 1210   Dr. Fransico Him is Medical Director for Cardiac Rehab at [facility].

## 2021-02-28 ENCOUNTER — Telehealth: Payer: Self-pay | Admitting: Internal Medicine

## 2021-02-28 NOTE — Telephone Encounter (Signed)
Patient called and said that she had dental work done this morning and they prescribed her amoxicillin. She said that she is allergic to that and that the pharmacy was sending something over. She is requesting a call back at [phone]. Please advise

## 2021-02-28 NOTE — Telephone Encounter (Signed)
Sorry, this request should be handled per her dentist who tried to prescibe the amoxil, as this is their responsiblity

## 2021-03-01 ENCOUNTER — Encounter (HOSPITAL_COMMUNITY)
Admission: RE | Admit: 2021-03-01 | Discharge: 2021-03-01 | Disposition: A | Discharge status: Home / Self Care | Payer: Medicare HMO | Source: Ambulatory Visit | Attending: Pulmonary Disease | Admitting: Pulmonary Disease

## 2021-03-01 ENCOUNTER — Other Ambulatory Visit: Payer: Self-pay

## 2021-03-01 DIAGNOSIS — J84112 Idiopathic pulmonary fibrosis: Secondary | ICD-10-CM | POA: Diagnosis not present

## 2021-03-01 NOTE — Progress Notes (Signed)
Daily Session Note  Patient Details  Name: Maria [name] MRN: [id number] Date of Birth: [birth year] Referring Provider:   April Manson Pulmonary Rehab Walk Test from 02/18/2021 in [place]  Referring Provider Dr. Vaughan Browner      Encounter Date: 03/01/2021  Check In:  Session Check In - 03/01/21 1110      Check-In   Supervising physician immediately available to respond to emergencies [facility] immediately available    Physician(s) Dr. Arbutus Ped    [facility]    Staff Present Rosebud Poles, RN, BSN;Carlette Wilber Oliphant, RN, Isaac Laud, MS, ACSM-CEP, Exercise Physiologist    Virtual Visit No    Medication changes reported     No    Fall or balance concerns reported    No    Tobacco Cessation No Change    Warm-up and Cool-down Performed on first and last piece of equipment    Resistance Training Performed Yes    VAD Patient? No    PAD/SET Patient? No      Pain Assessment   Currently in Pain? No/denies    Multiple Pain Sites No           Capillary Blood Glucose: No results found for this or any previous visit (from the past 24 hour(s)).    Social History   Tobacco Use  Smoking Status Former Smoker  . Packs/day: 1.00  . Years: 10.00  . Pack years: 10.00  . Types: Cigarettes  . Quit date: 12/18/1969  . Years since quitting: 51.2  Smokeless Tobacco Never Used    Goals Met:  Proper associated with RPD/PD & O2 Sat Exercise tolerated well Strength training completed today  Goals Unmet:  Not Applicable  Comments: Service time is from 1050 to 1155    Dr. Fransico Him is Medical Director for Cardiac Rehab at [facility].

## 2021-03-01 NOTE — Telephone Encounter (Signed)
Notified patient via voicemail.

## 2021-03-01 NOTE — Progress Notes (Signed)
Maria [name] 80 y.o. female Nutrition Note  Diagnosis: IPF  Past Medical History:  Diagnosis Date  . Anemia   . Arthritis   . Bronchitis   . Carpal tunnel syndrome    in calves neuropathy  . Diverticulosis   . GERD (gastroesophageal reflux disease)   . Headache(784.0)    chronic  . High cholesterol   . Hyperlipidemia   . Hypertension   . Migraine   . Nephritis   . OSA (obstructive sleep apnea)   . Palpitations   . Personal history of colonic adenomas 09/15/2010  . Stroke ([place])   . TIA (transient ischemic attack)   . Vertigo, benign paroxysmal      Medications reviewed.   Current Outpatient Medications:  .  albuterol (PROAIR HFA) 108 (90 Base) MCG/ACT inhaler, INHALE 2 PUFFS INTO THE LUNCH EVERY 6 HOURS AS NEEDED FOR WHEEZING OR SHORTNESS OF BREATH, Disp: 18 g, Rfl: 2 .  amLODipine (NORVASC) 5 MG tablet, TAKE 1 TABLET EVERY DAY, Disp: 90 tablet, Rfl: 2 .  b complex vitamins tablet, Take 1 tablet by mouth daily.  , Disp: , Rfl:  .  budesonide-formoterol (SYMBICORT) 160-4.5 MCG/ACT inhaler, Inhale 2 puffs into the lungs 2 (two) times daily. (Patient not taking: Reported on 02/18/2021), Disp: 1 Inhaler, Rfl: 3 .  CALCIUM PO, Take by mouth daily.  , Disp: , Rfl:  .  Cholecalciferol (VITAMIN D-3 PO), Take by mouth daily.  , Disp: , Rfl:  .  clopidogrel (PLAVIX) 75 MG tablet, TAKE 1 TABLET EVERY DAY, Disp: 90 tablet, Rfl: 2 .  co-enzyme Q-10 30 MG capsule, Take 30 mg by mouth 3 (three) times daily., Disp: , Rfl:  .  Collagen Hydrolysate POWD, 1 Scoop by Does not apply route daily., Disp: , Rfl:  .  ESBRIET 267 MG CAPS, TAKE 3 CAPSULES BY MOUTH WITH BREAKFAST, WITH LUNCH, AND WITH EVENING MEAL., Disp: 270 capsule, Rfl: 11 .  ferrous sulfate 324 MG TBEC, Take 324 mg by mouth every other day., Disp: , Rfl:  .  hydrochlorothiazide (MICROZIDE) 12.5 MG capsule, TAKE 1 CAPSULE EVERY DAY, Disp: 90 capsule, Rfl: 2 .  levocetirizine (XYZAL) 5 MG tablet, TAKE 1 TABLET EVERY EVENING, Disp:  90 tablet, Rfl: 3 .  LUTEIN PO, Take by mouth daily., Disp: , Rfl:  .  montelukast (SINGULAIR) 10 MG tablet, TAKE 1 TABLET AT BEDTIME, Disp: 90 tablet, Rfl: 2 .  Multiple Vitamins-Minerals (WOMENS MULTIVITAMIN PO), Take 1 capsule by mouth daily., Disp: , Rfl:  .  oxybutynin (DITROPAN-XL) 10 MG 24 hr tablet, TAKE 1 TABLET BY MOUTH Q DAILY, Disp: 90 tablet, Rfl: 3 .  pantoprazole (PROTONIX) 40 MG tablet, Take 1 tablet (40 mg total) by mouth 2 (two) times daily., Disp: 180 tablet, Rfl: 2 .  potassium chloride (KLOR-CON) 10 MEQ tablet, TAKE 1 TABLET EVERY DAY, Disp: 90 tablet, Rfl: 2 .  simvastatin (ZOCOR) 10 MG tablet, TAKE 1 TABLET EVERY DAY  AT  6  PM, Disp: 90 tablet, Rfl: 2 .  trimethoprim (TRIMPEX) 100 MG tablet, Take 100 mg by mouth daily. , Disp: , Rfl:  .  Turmeric (QC TUMERIC COMPLEX) 500 MG CAPS, Take 1 capsule by mouth daily., Disp: , Rfl:  .  zinc gluconate 50 MG tablet, Take 50 mg by mouth daily., Disp: , Rfl:    Ht Readings from Last 1 Encounters:  02/18/21 _0  (1.575 m)     Wt Readings from Last 3 Encounters:  02/18/21 168 lb  14 oz (76.6 kg)  12/08/20 166 lb 12.8 oz (75.7 kg)  10/11/20 169 lb (76.7 kg)     There is no height or weight on file to calculate BMI.   Social History   Tobacco Use  Smoking Status Former Smoker  . Packs/day: 1.00  . Years: 10.00  . Pack years: 10.00  . Types: Cigarettes  . Quit date: 12/18/1969  . Years since quitting: 51.2  Smokeless Tobacco Never Used      Nutrition Note  Spoke with pt. Nutrition Plan and Nutrition Survey goals reviewed with pt.   Pt has Pre-diabetes. Last A1c indicates blood glucose well-controlled. Pt reports nausea after taking esbriet occasionally. She always takes it with meals. She has started eating 3 meals per day with esbriet. She tries to incorporate some protein.  She avoids dairy.  Her weight has been maintained.  She is concerned about her food choices as she eats "junk food" when she feels nauseous.   She is getting dentures soon. She thinks she will have to eat more soft foods.  Discussed healthier snack options.  She is on O2 with exercise. She does not report any difficulties preparing or procuring foods.  Pt expressed understanding of the information reviewed.    Nutrition Diagnosis ? Food-and nutrition-related knowledge deficit related to lack of exposure to information as related to diagnosis of: ? IPF, HTN ? Pre-diabetes  Nutrition Intervention ? Pt's individual nutrition plan reviewed with pt. ? Benefits of adopting healthy diet reviewed with Rate My Plate survey ? Continue client-centered nutrition education by RD, as part of interdisciplinary care.  Goal(s) ? Pt to build a healthy plate including vegetables, fruits, whole grains, and low-fat dairy products in a heart healthy meal plan. ? Pt to choose healthy snacks to counteract nausea ? Try Hippeas snacks Plan:   Will provide client-centered nutrition education as part of interdisciplinary care  Monitor and evaluate progress toward nutrition goal with team.   Michaele Offer, MS, RDN, LDN

## 2021-03-03 ENCOUNTER — Encounter (HOSPITAL_COMMUNITY): Payer: Medicare HMO

## 2021-03-03 ENCOUNTER — Telehealth (HOSPITAL_COMMUNITY): Payer: Self-pay | Admitting: Internal Medicine

## 2021-03-08 ENCOUNTER — Other Ambulatory Visit: Payer: Self-pay

## 2021-03-08 ENCOUNTER — Encounter (HOSPITAL_COMMUNITY)
Admission: RE | Admit: 2021-03-08 | Discharge: 2021-03-08 | Disposition: A | Discharge status: Home / Self Care | Payer: Medicare HMO | Source: Ambulatory Visit | Attending: Pulmonary Disease | Admitting: Pulmonary Disease

## 2021-03-08 VITALS — Wt 167.3 lb

## 2021-03-08 DIAGNOSIS — J84112 Idiopathic pulmonary fibrosis: Secondary | ICD-10-CM | POA: Diagnosis not present

## 2021-03-08 NOTE — Progress Notes (Signed)
Daily Session Note  Patient Details  Name: ARAIYA TILMON MRN: [id number] Date of Birth: 1941-02-15 Referring Provider:   April Manson Pulmonary Rehab Walk Test from 02/18/2021 in [place]  Referring Provider Dr. Vaughan Browner      Encounter Date: 03/08/2021  Check In:  Session Check In - 03/08/21 1111      Check-In   Supervising physician immediately available to respond to emergencies [facility] immediately available    Physician(s) Dr. Tawanna Solo    [facility]    Staff Present Rosebud Poles, RN, Isaac Laud, MS, ACSM-CEP, Exercise Physiologist;Lisa Ysidro Evert, RN    Virtual Visit No    Medication changes reported     No    Fall or balance concerns reported    No    Tobacco Cessation No Change    Warm-up and Cool-down Performed on first and last piece of equipment    Resistance Training Performed No    VAD Patient? No      Pain Assessment   Currently in Pain? No/denies           Capillary Blood Glucose: No results found for this or any previous visit (from the past 24 hour(s)).   Exercise Prescription Changes - 03/08/21 1100      Response to Exercise   Blood Pressure (Admit) 118/70    Blood Pressure (Exercise) 142/70    Blood Pressure (Exit) 108/68    Heart Rate (Admit) 84 bpm    Heart Rate (Exercise) 104 bpm    Heart Rate (Exit) 92 bpm    Oxygen Saturation (Admit) 97 %    Oxygen Saturation (Exercise) 96 %    Oxygen Saturation (Exit) 97 %    Rating of Perceived Exertion (Exercise) 9    Perceived Dyspnea (Exercise) 0    Duration Continue with 30 min of aerobic exercise without signs/symptoms of physical distress.    Intensity Other (comment)   40%-80% HR Max     Progression   Progression Continue to progress workloads to maintain intensity without signs/symptoms of physical distress.      Resistance Training   Training Prescription Yes    Weight orange bands    Reps 10-15    Time 10 Minutes       Oxygen   Oxygen Continuous    Liters 4      Treadmill   MPH 2.5    Grade 0    Minutes 15      NuStep   Level 3    SPM 80    Minutes 15    METs 2.5           Social History   Tobacco Use  Smoking Status Former Smoker  . Packs/day: 1.00  . Years: 10.00  . Pack years: 10.00  . Types: Cigarettes  . Quit date: 12/18/1969  . Years since quitting: 51.2  Smokeless Tobacco Never Used    Goals Met:  Proper associated with RPD/PD & O2 Sat Exercise tolerated well Strength training completed today  Goals Unmet:  Not Applicable  Comments: Service time is from 1053 to 1147    Dr. Fransico Him is Medical Director for Cardiac Rehab at [facility].

## 2021-03-10 ENCOUNTER — Other Ambulatory Visit: Payer: Self-pay

## 2021-03-10 ENCOUNTER — Encounter (HOSPITAL_COMMUNITY)
Admission: RE | Admit: 2021-03-10 | Discharge: 2021-03-10 | Disposition: A | Discharge status: Home / Self Care | Payer: Medicare HMO | Source: Ambulatory Visit | Attending: Pulmonary Disease | Admitting: Pulmonary Disease

## 2021-03-10 DIAGNOSIS — J84112 Idiopathic pulmonary fibrosis: Secondary | ICD-10-CM | POA: Diagnosis not present

## 2021-03-10 NOTE — Progress Notes (Signed)
Daily Session Note  Patient Details  Name: Maria [name] MRN: [id number] Date of Birth: 1941/02/29 Referring Provider:   April Manson Pulmonary Rehab Walk Test from 02/18/2021 in [place]  Referring Provider Dr. Vaughan Browner      Encounter Date: 03/10/2021  Check In:  Session Check In - 03/10/21 1113      Check-In   Supervising physician immediately available to respond to emergencies [facility] immediately available    Physician(s) Dr. Tawanna Solo    Location [facility]    Staff Present Rosebud Poles, RN, BSN;Debrina Kizer Ysidro Evert, RN;Jessica Hassell Done, MS, ACSM-CEP, Exercise Physiologist    Virtual Visit No    Medication changes reported     No    Fall or balance concerns reported    No    Tobacco Cessation No Change    Warm-up and Cool-down Performed on first and last piece of equipment    Resistance Training Performed Yes    VAD Patient? No    PAD/SET Patient? No      Pain Assessment   Currently in Pain? No/denies    Multiple Pain Sites No           Capillary Blood Glucose: No results found for this or any previous visit (from the past 24 hour(s)).    Social History   Tobacco Use  Smoking Status Former Smoker  . Packs/day: 1.00  . Years: 10.00  . Pack years: 10.00  . Types: Cigarettes  . Quit date: 12/18/1969  . Years since quitting: 51.2  Smokeless Tobacco Never Used    Goals Met:  Exercise tolerated well No report of cardiac concerns or symptoms Strength training completed today  Goals Unmet:  Not Applicable  Comments: Service time is from 1045 to 1145    Dr. Fransico Him is Medical Director for Cardiac Rehab at [facility].

## 2021-03-15 ENCOUNTER — Telehealth: Payer: Self-pay | Admitting: Pulmonary Disease

## 2021-03-15 ENCOUNTER — Other Ambulatory Visit (HOSPITAL_COMMUNITY): Payer: Self-pay

## 2021-03-15 ENCOUNTER — Other Ambulatory Visit: Payer: Self-pay

## 2021-03-15 ENCOUNTER — Encounter (HOSPITAL_COMMUNITY)
Admission: RE | Admit: 2021-03-15 | Discharge: 2021-03-15 | Disposition: A | Discharge status: Home / Self Care | Payer: Medicare HMO | Source: Ambulatory Visit | Attending: Pulmonary Disease | Admitting: Pulmonary Disease

## 2021-03-15 ENCOUNTER — Other Ambulatory Visit (INDEPENDENT_AMBULATORY_CARE_PROVIDER_SITE_OTHER): Payer: Medicare HMO

## 2021-03-15 DIAGNOSIS — J84112 Idiopathic pulmonary fibrosis: Secondary | ICD-10-CM

## 2021-03-15 DIAGNOSIS — Z5181 Encounter for therapeutic drug level monitoring: Secondary | ICD-10-CM

## 2021-03-15 LAB — CBC WITH DIFFERENTIAL/PLATELET
Basophils Absolute: 0.1 10*3/uL (ref 0.0–0.1)
Basophils Relative: 0.8 % (ref 0.0–3.0)
Eosinophils Absolute: 0.1 10*3/uL (ref 0.0–0.7)
Eosinophils Relative: 1.3 % (ref 0.0–5.0)
HCT: 39.4 % (ref 36.0–46.0)
Hemoglobin: 13.6 g/dL (ref 12.0–15.0)
Lymphocytes Relative: 28.5 % (ref 12.0–46.0)
Lymphs Abs: 1.9 10*3/uL (ref 0.7–4.0)
MCHC: 34.4 g/dL (ref 30.0–36.0)
MCV: 89.9 fl (ref 78.0–100.0)
Monocytes Absolute: 0.8 10*3/uL (ref 0.1–1.0)
Monocytes Relative: 11.5 % (ref 3.0–12.0)
Neutro Abs: 3.9 10*3/uL (ref 1.4–7.7)
Neutrophils Relative %: 57.9 % (ref 43.0–77.0)
Platelets: 310 10*3/uL (ref 150.0–400.0)
RBC: 4.39 Mil/uL (ref 3.87–5.11)
RDW: 13.4 % (ref 11.5–15.5)
WBC: 6.7 10*3/uL (ref 4.0–10.5)

## 2021-03-15 LAB — COMPREHENSIVE METABOLIC PANEL
ALT: 16 U/L (ref 0–35)
AST: 19 U/L (ref 0–37)
Albumin: 4.2 g/dL (ref 3.5–5.2)
Alkaline Phosphatase: 84 U/L (ref 39–117)
BUN: 13 mg/dL (ref 6–23)
CO2: 29 mEq/L (ref 19–32)
Calcium: 9.6 mg/dL (ref 8.4–10.5)
Chloride: 96 mEq/L (ref 96–112)
Creatinine, Ser: 0.67 mg/dL (ref 0.40–1.20)
GFR: 82.59 mL/min (ref >60.00)
Glucose, Bld: 105 mg/dL — ABNORMAL HIGH (ref 70–99)
Potassium: 3.9 mEq/L (ref 3.5–5.1)
Sodium: 132 mEq/L — ABNORMAL LOW (ref 135–145)
Total Bilirubin: 0.4 mg/dL (ref 0.2–1.2)
Total Protein: 7.1 g/dL (ref 6.0–8.3)

## 2021-03-15 LAB — HEPATIC FUNCTION PANEL
ALT: 16 U/L (ref 0–35)
AST: 19 U/L (ref 0–37)
Albumin: 4.2 g/dL (ref 3.5–5.2)
Alkaline Phosphatase: 84 U/L (ref 39–117)
Bilirubin, Direct: 0.2 mg/dL (ref 0.0–0.3)
Total Bilirubin: 0.4 mg/dL (ref 0.2–1.2)
Total Protein: 7.1 g/dL (ref 6.0–8.3)

## 2021-03-15 NOTE — Progress Notes (Signed)
Pulmonary Individual Treatment Plan  Patient Details  Name: Maria [name] MRN: [id number] Date of Birth: Apr 26, 1941 Referring Provider:   April Manson Pulmonary Rehab Walk Test from 02/18/2021 in [place]  Referring Provider Dr. Vaughan Browner      Initial Encounter Date:  Flowsheet Row Pulmonary Rehab Walk Test from 02/18/2021 in [place]  Date 02/18/21      Visit Diagnosis: IPF (idiopathic pulmonary fibrosis) ([place])  Patient's Home Medications on Admission:   Current Outpatient Medications:  .  albuterol (PROAIR HFA) 108 (90 Base) MCG/ACT inhaler, INHALE 2 PUFFS INTO THE LUNCH EVERY 6 HOURS AS NEEDED FOR WHEEZING OR SHORTNESS OF BREATH, Disp: 18 g, Rfl: 2 .  amLODipine (NORVASC) 5 MG tablet, TAKE 1 TABLET EVERY DAY, Disp: 90 tablet, Rfl: 2 .  b complex vitamins tablet, Take 1 tablet by mouth daily.  , Disp: , Rfl:  .  budesonide-formoterol (SYMBICORT) 160-4.5 MCG/ACT inhaler, Inhale 2 puffs into the lungs 2 (two) times daily. (Patient not taking: Reported on 02/18/2021), Disp: 1 Inhaler, Rfl: 3 .  CALCIUM PO, Take by mouth daily.  , Disp: , Rfl:  .  Cholecalciferol (VITAMIN D-3 PO), Take by mouth daily.  , Disp: , Rfl:  .  clopidogrel (PLAVIX) 75 MG tablet, TAKE 1 TABLET EVERY DAY, Disp: 90 tablet, Rfl: 2 .  co-enzyme Q-10 30 MG capsule, Take 30 mg by mouth 3 (three) times daily., Disp: , Rfl:  .  Collagen Hydrolysate POWD, 1 Scoop by Does not apply route daily., Disp: , Rfl:  .  ESBRIET 267 MG CAPS, TAKE 3 CAPSULES BY MOUTH WITH BREAKFAST, WITH LUNCH, AND WITH EVENING MEAL., Disp: 270 capsule, Rfl: 11 .  ferrous sulfate 324 MG TBEC, Take 324 mg by mouth every other day., Disp: , Rfl:  .  hydrochlorothiazide (MICROZIDE) 12.5 MG capsule, TAKE 1 CAPSULE EVERY DAY, Disp: 90 capsule, Rfl: 2 .  levocetirizine (XYZAL) 5 MG tablet, TAKE 1 TABLET EVERY EVENING, Disp: 90 tablet, Rfl: 3 .  LUTEIN PO, Take by mouth daily., Disp: ,  Rfl:  .  montelukast (SINGULAIR) 10 MG tablet, TAKE 1 TABLET AT BEDTIME, Disp: 90 tablet, Rfl: 2 .  Multiple Vitamins-Minerals (WOMENS MULTIVITAMIN PO), Take 1 capsule by mouth daily., Disp: , Rfl:  .  oxybutynin (DITROPAN-XL) 10 MG 24 hr tablet, TAKE 1 TABLET BY MOUTH Q DAILY, Disp: 90 tablet, Rfl: 3 .  pantoprazole (PROTONIX) 40 MG tablet, Take 1 tablet (40 mg total) by mouth 2 (two) times daily., Disp: 180 tablet, Rfl: 2 .  potassium chloride (KLOR-CON) 10 MEQ tablet, TAKE 1 TABLET EVERY DAY, Disp: 90 tablet, Rfl: 2 .  simvastatin (ZOCOR) 10 MG tablet, TAKE 1 TABLET EVERY DAY  AT  6  PM, Disp: 90 tablet, Rfl: 2 .  trimethoprim (TRIMPEX) 100 MG tablet, Take 100 mg by mouth daily. , Disp: , Rfl:  .  Turmeric (QC TUMERIC COMPLEX) 500 MG CAPS, Take 1 capsule by mouth daily., Disp: , Rfl:  .  zinc gluconate 50 MG tablet, Take 50 mg by mouth daily., Disp: , Rfl:   Past Medical History: Past Medical History:  Diagnosis Date  . Anemia   . Arthritis   . Bronchitis   . Carpal tunnel syndrome    in calves neuropathy  . Diverticulosis   . GERD (gastroesophageal reflux disease)   . Headache(784.0)    chronic  . High cholesterol   . Hyperlipidemia   . Hypertension   .  Migraine   . Nephritis   . OSA (obstructive sleep apnea)   . Palpitations   . Personal history of colonic adenomas 09/15/2010  . Stroke ([place])   . TIA (transient ischemic attack)   . Vertigo, benign paroxysmal     Tobacco Use: Social History   Tobacco Use  Smoking Status Former Smoker  . Packs/day: 1.00  . Years: 10.00  . Pack years: 10.00  . Types: Cigarettes  . Quit date: 12/18/1969  . Years since quitting: 51.2  Smokeless Tobacco Never Used    Labs: Recent Chemical engineer    Labs for ITP Cardiac and Pulmonary Rehab Latest Ref Rng & Units 10/23/2013 11/10/2014 12/01/2016 02/18/2018 04/16/2020   Cholestrol 0 - 200 mg/dL 202(H) 154 137 - 137   LDLCALC 0 - 99 mg/dL - 80 66 - 56   LDLDIRECT mg/dL 113.2 - - - -    HDL >39.00 mg/dL 73.10 59.60 60.30 - 59.50   Trlycerides 0.0 - 149.0 mg/dL 102.0 74.0 52.0 - 107.0   Hemoglobin A1c 4.6 - 6.5 % 5.8 6.2 5.8 5.9 5.7      Capillary Blood Glucose: Lab Results  Component Value Date   GLUCAP 99 02/01/2011     Pulmonary Assessment Scores:  Pulmonary Assessment Scores    Row Name 02/18/21 1350         ADL UCSD   ADL Phase Entry     SOB Score total 72           CAT Score   CAT Score 22           mMRC Score   mMRC Score 4           UCSD: Self-administered rating of dyspnea associated with activities of daily living (ADLs) 6-point scale (0 = "not at all" to 5 = "maximal or unable to do because of breathlessness")  Scoring Scores range from 0 to 120.  Minimally important difference is 5 units  CAT: CAT can identify the health impairment of COPD patients and is better correlated with disease progression.  CAT has a scoring range of zero to 40. The CAT score is classified into four groups of low (less than 10), medium (10 - 20), high (21-30) and very high (31-40) based on the impact level of disease on health status. A CAT score over 10 suggests significant symptoms.  A worsening CAT score could be explained by an exacerbation, poor medication adherence, poor inhaler technique, or progression of COPD or comorbid conditions.  CAT MCID is 2 points  mMRC: mMRC (Modified Medical Research Council) Dyspnea Scale is used to assess the degree of baseline functional disability in patients of respiratory disease due to dyspnea. No minimal important difference is established. A decrease in score of 1 point or greater is considered a positive change.   Pulmonary Function Assessment:  Pulmonary Function Assessment - 02/18/21 0946      Breath   Shortness of Breath Yes;Limiting activity           Exercise Target Goals: Exercise Program Goal: Individual exercise prescription set using results from initial 6 min walk test and THRR while considering   patient's activity barriers and safety.   Exercise Prescription Goal: Initial exercise prescription builds to 30-45 minutes a day of aerobic activity, 2-3 days per week.  Home exercise guidelines will be given to patient during program as part of exercise prescription that the participant will acknowledge.  Activity Barriers & Risk Stratification:  Activity Barriers &  Cardiac Risk Stratification - 02/18/21 0942      Activity Barriers & Cardiac Risk Stratification   Activity Barriers Back Problems;Deconditioning;Shortness of Breath;Left Knee Replacement;Right Knee Replacement           6 Minute Walk:  6 Minute Walk    Row Name 02/18/21 1217         6 Minute Walk   Phase Initial     Distance 1348 feet     Walk Time 6 minutes     # of Rest Breaks 0     MPH 2.55     METS 2.39     RPE 8     Perceived Dyspnea  1     VO2 Peak 8.35     Symptoms No     Resting HR 59 bpm     Resting BP 128/64     Resting Oxygen Saturation  99 %     Exercise Oxygen Saturation  during 6 min walk 94 %     Max Ex. HR 105 bpm     Max Ex. BP 152/78     2 Minute Post BP 122/74           Interval HR   1 Minute HR 96     2 Minute HR 98     3 Minute HR 100     4 Minute HR 99     5 Minute HR 105     6 Minute HR 105     2 Minute Post HR 72     Interval Heart Rate? Yes           Interval Oxygen   Interval Oxygen? Yes     Baseline Oxygen Saturation % 99 %     1 Minute Oxygen Saturation % 94 %     1 Minute Liters of Oxygen 4 L     2 Minute Oxygen Saturation % 99 %     2 Minute Liters of Oxygen 4 L     3 Minute Oxygen Saturation % 100 %     3 Minute Liters of Oxygen 4 L     4 Minute Oxygen Saturation % 98 %     4 Minute Liters of Oxygen 4 L     5 Minute Oxygen Saturation % 97 %     5 Minute Liters of Oxygen 4 L     6 Minute Oxygen Saturation % 97 %     6 Minute Liters of Oxygen 4 L     2 Minute Post Oxygen Saturation % 99 %     2 Minute Post Liters of Oxygen 4 L            Oxygen  Initial Assessment:  Oxygen Initial Assessment - 02/18/21 0945      Home Oxygen   Home Oxygen Device Portable Concentrator;Home Concentrator    Sleep Oxygen Prescription CPAP    Home Exercise Oxygen Prescription Continuous    Liters per minute 4    Home Resting Oxygen Prescription Continuous    Liters per minute 4    Compliance with Home Oxygen Use No    Comments --   Does not wear oxygen at rest like she is prescribed     Initial 6 min Walk   Oxygen Used Continuous    Liters per minute 4      Program Oxygen Prescription   Program Oxygen Prescription Continuous    Liters per minute 4  Intervention   Short Term Goals To learn and understand importance of monitoring SPO2 with pulse oximeter and demonstrate accurate use of the pulse oximeter.;To learn and understand importance of maintaining oxygen saturations>88%;To learn and demonstrate proper pursed lip breathing techniques or other breathing techniques.;To learn and demonstrate proper use of respiratory medications;To learn and exhibit compliance with exercise, home and travel O2 prescription    Long  Term Goals Exhibits compliance with exercise, home and travel O2 prescription;Verbalizes importance of monitoring SPO2 with pulse oximeter and return demonstration;Maintenance of O2 saturations>88%;Exhibits proper breathing techniques, such as pursed lip breathing or other method taught during program session;Compliance with respiratory medication;Demonstrates proper use of MDI's           Oxygen Re-Evaluation:  Oxygen Re-Evaluation    Row Name 03/15/21 0836             Program Oxygen Prescription   Program Oxygen Prescription Continuous       Liters per minute 4               Home Oxygen   Home Oxygen Device Portable Concentrator;Home Concentrator       Sleep Oxygen Prescription CPAP       Home Exercise Oxygen Prescription Continuous       Liters per minute 4       Home Resting Oxygen Prescription Continuous        Liters per minute 4               Goals/Expected Outcomes   Short Term Goals To learn and understand importance of monitoring SPO2 with pulse oximeter and demonstrate accurate use of the pulse oximeter.;To learn and understand importance of maintaining oxygen saturations>88%;To learn and demonstrate proper pursed lip breathing techniques or other breathing techniques.;To learn and demonstrate proper use of respiratory medications;To learn and exhibit compliance with exercise, home and travel O2 prescription       Long  Term Goals Exhibits compliance with exercise, home and travel O2 prescription;Verbalizes importance of monitoring SPO2 with pulse oximeter and return demonstration;Maintenance of O2 saturations>88%;Exhibits proper breathing techniques, such as pursed lip breathing or other method taught during program session;Compliance with respiratory medication;Demonstrates proper use of MDI's       Goals/Expected Outcomes compliance and understanding of oxygen saturation and pursed lip breathing.              Oxygen Discharge (Final Oxygen Re-Evaluation):  Oxygen Re-Evaluation - 03/15/21 0836      Program Oxygen Prescription   Program Oxygen Prescription Continuous    Liters per minute 4      Home Oxygen   Home Oxygen Device Portable Concentrator;Home Concentrator    Sleep Oxygen Prescription CPAP    Home Exercise Oxygen Prescription Continuous    Liters per minute 4    Home Resting Oxygen Prescription Continuous    Liters per minute 4      Goals/Expected Outcomes   Short Term Goals To learn and understand importance of monitoring SPO2 with pulse oximeter and demonstrate accurate use of the pulse oximeter.;To learn and understand importance of maintaining oxygen saturations>88%;To learn and demonstrate proper pursed lip breathing techniques or other breathing techniques.;To learn and demonstrate proper use of respiratory medications;To learn and exhibit compliance with exercise, home  and travel O2 prescription    Long  Term Goals Exhibits compliance with exercise, home and travel O2 prescription;Verbalizes importance of monitoring SPO2 with pulse oximeter and return demonstration;Maintenance of O2 saturations>88%;Exhibits proper breathing techniques, such as pursed lip  breathing or other method taught during program session;Compliance with respiratory medication;Demonstrates proper use of MDI's    Goals/Expected Outcomes compliance and understanding of oxygen saturation and pursed lip breathing.           Initial Exercise Prescription:  Initial Exercise Prescription - 02/18/21 1200      Date of Initial Exercise RX and Referring Provider   Date 02/18/21    Referring Provider Dr. Vaughan Browner    Expected Discharge Date 04/21/21      Oxygen   Oxygen Continuous    Liters 4      Treadmill   MPH 2    Grade 0    Minutes 15      NuStep   Level 2    SPM 80    Minutes 15      Prescription Details   Frequency (times per week) 2    Duration Progress to 30 minutes of continuous aerobic without signs/symptoms of physical distress      Intensity   THRR 40-80% of Max Heartrate 56-112    Ratings of Perceived Exertion 11-13    Perceived Dyspnea 0-4      Progression   Progression Continue to progress workloads to maintain intensity without signs/symptoms of physical distress.      Resistance Training   Training Prescription Yes    Weight orange bands    Reps 10-15           Perform Capillary Blood Glucose checks as needed.  Exercise Prescription Changes:  Exercise Prescription Changes    Row Name 03/08/21 1100             Response to Exercise   Blood Pressure (Admit) 118/70       Blood Pressure (Exercise) 142/70       Blood Pressure (Exit) 108/68       Heart Rate (Admit) 84 bpm       Heart Rate (Exercise) 104 bpm       Heart Rate (Exit) 92 bpm       Oxygen Saturation (Admit) 97 %       Oxygen Saturation (Exercise) 96 %       Oxygen Saturation (Exit)  97 %       Rating of Perceived Exertion (Exercise) 9       Perceived Dyspnea (Exercise) 0       Duration Continue with 30 min of aerobic exercise without signs/symptoms of physical distress.       Intensity Other (comment)  40%-80% HR Max               Progression   Progression Continue to progress workloads to maintain intensity without signs/symptoms of physical distress.               Resistance Training   Training Prescription Yes       Weight orange bands       Reps 10-15       Time 10 Minutes               Oxygen   Oxygen Continuous       Liters 4               Treadmill   MPH 2.5       Grade 0       Minutes 15               NuStep   Level 3       SPM 80  Minutes 15       METs 2.5              Exercise Comments:  Exercise Comments    Row Name 02/24/21 1212           Exercise Comments Patient completed first day of exercise and tolerated well with no complaints or concerns. She was able to do 15 minutes on the Nustep and 15 minutes on the treadmill with no rest breaks and no complaints of shortness of breath. She was also able to do the resistance bands and stretches with no mobility issues or limitations. Will continue to monitor.              Exercise Goals and Review:  Exercise Goals    Row Name 02/18/21 1210             Exercise Goals   Increase Physical Activity Yes       Intervention Provide advice, education, support and counseling about physical activity/exercise needs.;Develop an individualized exercise prescription for aerobic and resistive training based on initial evaluation findings, risk stratification, comorbidities and participant's personal goals.       Expected Outcomes Short Term: Attend rehab on a regular basis to increase amount of physical activity.;Long Term: Add in home exercise to make exercise part of routine and to increase amount of physical activity.;Long Term: Exercising regularly at least 3-5 days a week.        Increase Strength and Stamina Yes       Intervention Provide advice, education, support and counseling about physical activity/exercise needs.;Develop an individualized exercise prescription for aerobic and resistive training based on initial evaluation findings, risk stratification, comorbidities and participant's personal goals.       Expected Outcomes Short Term: Increase workloads from initial exercise prescription for resistance, speed, and METs.;Short Term: Perform resistance training exercises routinely during rehab and add in resistance training at home;Long Term: Improve cardiorespiratory fitness, muscular endurance and strength as measured by increased METs and functional capacity (6MWT)       Able to understand and use rate of perceived exertion (RPE) scale Yes       Intervention Provide education and explanation on how to use RPE scale       Expected Outcomes Short Term: Able to use RPE daily in rehab to express subjective intensity level;Long Term:  Able to use RPE to guide intensity level when exercising independently       Able to understand and use Dyspnea scale Yes       Intervention Provide education and explanation on how to use Dyspnea scale       Expected Outcomes Short Term: Able to use Dyspnea scale daily in rehab to express subjective sense of shortness of breath during exertion;Long Term: Able to use Dyspnea scale to guide intensity level when exercising independently       Knowledge and understanding of Target Heart Rate Range (THRR) Yes       Intervention Provide education and explanation of THRR including how the numbers were predicted and where they are located for reference       Expected Outcomes Short Term: Able to state/look up THRR;Long Term: Able to use THRR to govern intensity when exercising independently;Short Term: Able to use daily as guideline for intensity in rehab       Understanding of Exercise Prescription Yes       Intervention Provide education,  explanation, and written materials on patient's individual exercise prescription  Expected Outcomes Short Term: Able to explain program exercise prescription;Long Term: Able to explain home exercise prescription to exercise independently              Exercise Goals Re-Evaluation :  Exercise Goals Re-Evaluation    Row Name 03/15/21 0833             Exercise Goal Re-Evaluation   Exercise Goals Review Increase Physical Activity;Increase Strength and Stamina;Able to understand and use rate of perceived exertion (RPE) scale;Able to understand and use Dyspnea scale;Knowledge and understanding of Target Heart Rate Range (THRR);Understanding of Exercise Prescription       Comments Pt has completed 4 exercise sessions and has made some progression with workload increases in the short time she has been here. She has tolerated exercise well with no complaints and is becoming independent with equipment and resistance band exercises. She is exercising at 2.7 METS on the Nustep on level 3. Will continue to monitor and progress as she is able.       Expected Outcomes Through exercise at rehab and home the patient will decrease shortness of breath with daily activities and feel confident in carrying out an exercise regimn at home.              Discharge Exercise Prescription (Final Exercise Prescription Changes):  Exercise Prescription Changes - 03/08/21 1100      Response to Exercise   Blood Pressure (Admit) 118/70    Blood Pressure (Exercise) 142/70    Blood Pressure (Exit) 108/68    Heart Rate (Admit) 84 bpm    Heart Rate (Exercise) 104 bpm    Heart Rate (Exit) 92 bpm    Oxygen Saturation (Admit) 97 %    Oxygen Saturation (Exercise) 96 %    Oxygen Saturation (Exit) 97 %    Rating of Perceived Exertion (Exercise) 9    Perceived Dyspnea (Exercise) 0    Duration Continue with 30 min of aerobic exercise without signs/symptoms of physical distress.    Intensity Other (comment)   40%-80% HR  Max     Progression   Progression Continue to progress workloads to maintain intensity without signs/symptoms of physical distress.      Resistance Training   Training Prescription Yes    Weight orange bands    Reps 10-15    Time 10 Minutes      Oxygen   Oxygen Continuous    Liters 4      Treadmill   MPH 2.5    Grade 0    Minutes 15      NuStep   Level 3    SPM 80    Minutes 15    METs 2.5           Nutrition:  Target Goals: Understanding of nutrition guidelines, daily intake of sodium <1585m, cholesterol <2034m calories 30% from fat and 7% or less from saturated fats, daily to have 5 or more servings of fruits and vegetables.  Biometrics:  Pre Biometrics - 02/18/21 1211      Pre Biometrics   Grip Strength 24 kg            Nutrition Therapy Plan and Nutrition Goals:  Nutrition Therapy & Goals - 03/01/21 1202      Nutrition Therapy   Diet TLC    Drug/Food Interactions Statins/Certain Fruits      Personal Nutrition Goals   Nutrition Goal Pt to build a healthy plate including vegetables, fruits, whole grains, and low-fat dairy products in  a heart healthy meal plan.    Personal Goal #2 Pt to choose healthy snacks to counteract nausea    Personal Goal #3 Try Hippeas snacks      Intervention Plan   Intervention Prescribe, educate and counsel regarding individualized specific dietary modifications aiming towards targeted core components such as weight, hypertension, lipid management, diabetes, heart failure and other comorbidities.;Nutrition handout(s) given to patient.    Expected Outcomes Short Term Goal: Understand basic principles of dietary content, such as calories, fat, sodium, cholesterol and nutrients.           Nutrition Assessments:  MEDIFICTS Score Key:  ?70 Need to make dietary changes   40-70 Heart Healthy Diet  ? 40 Therapeutic Level Cholesterol Diet  Flowsheet Row PULMONARY REHAB OTHER RESPIRATORY from 03/08/2021 in [facility]  Picture Your Plate Total Score on Admission 59     Picture Your Plate Scores:  <19 Unhealthy dietary pattern with much room for improvement.  41-50 Dietary pattern unlikely to meet recommendations for good health and room for improvement.  51-60 More healthful dietary pattern, with some room for improvement.   >60 Healthy dietary pattern, although there may be some specific behaviors that could be improved.    Nutrition Goals Re-Evaluation:  Nutrition Goals Re-Evaluation    [place] Name 03/01/21 1203             Goals   Current Weight 168 lb (76.2 kg)       Nutrition Goal Pt to build a healthy plate including vegetables, fruits, whole grains, and low-fat dairy products in a heart healthy meal plan.               Personal Goal #2 Re-Evaluation   Personal Goal #2 Pt to choose healthy snacks to counteract nausea               Personal Goal #3 Re-Evaluation   Personal Goal #3 Try Hippeas snacks              Nutrition Goals Discharge (Final Nutrition Goals Re-Evaluation):  Nutrition Goals Re-Evaluation - 03/01/21 1203      Goals   Current Weight 168 lb (76.2 kg)    Nutrition Goal Pt to build a healthy plate including vegetables, fruits, whole grains, and low-fat dairy products in a heart healthy meal plan.      Personal Goal #2 Re-Evaluation   Personal Goal #2 Pt to choose healthy snacks to counteract nausea      Personal Goal #3 Re-Evaluation   Personal Goal #3 Try Hippeas snacks           Psychosocial: Target Goals: Acknowledge presence or absence of significant depression and/or stress, maximize coping skills, provide positive support system. Participant is able to verbalize types and ability to use techniques and skills needed for reducing stress and depression.  Initial Review & Psychosocial Screening:  Initial Psych Review & Screening - 02/18/21 0948      Initial Review   Current issues with None Identified      Family  Dynamics   Good Support System? Yes   Friends and Bible study     Barriers   Psychosocial barriers to participate in program The patient should benefit from training in stress management and relaxation.      Screening Interventions   Interventions Encouraged to exercise           Quality of Life Scores:  Scores of 19 and below usually indicate a  poorer quality of life in these areas.  A difference of  2-3 points is a clinically meaningful difference.  A difference of 2-3 points in the total score of the Quality of Life Index has been associated with significant improvement in overall quality of life, self-image, physical symptoms, and general health in studies assessing change in quality of life.  PHQ-9: Recent Review Flowsheet Data    Depression screen [facility] 2/9 02/18/2021 08/25/2020 08/05/2020 01/23/2020 08/08/2019   Decreased Interest 0 0 0 0 0   Down, Depressed, Hopeless 0 0 0 1 0   PHQ - 2 Score 0 0 0 1 0   Altered sleeping 1 - - - -   Tired, decreased energy 0 - - - -   Change in appetite 0 - - - -   Feeling bad or failure about yourself  0 - - - -   Trouble concentrating 0 - - - -   Moving slowly or fidgety/restless 0 - - - -   Suicidal thoughts 0 - - - -   Difficult doing work/chores Not difficult at all - - - -     Interpretation of Total Score  Total Score Depression Severity:  1-4 = Minimal depression, 5-9 = Mild depression, 10-14 = Moderate depression, 15-19 = Moderately severe depression, 20-27 = Severe depression   Psychosocial Evaluation and Intervention:  Psychosocial Evaluation - 02/18/21 1212      Psychosocial Evaluation & Interventions   Interventions Encouraged to exercise with the program and follow exercise prescription    Continue Psychosocial Services  No Follow up required           Psychosocial Re-Evaluation:  Psychosocial Re-Evaluation    [place] Name 03/14/21 1224             Psychosocial Re-Evaluation   Current issues with None Identified        Comments No psychosocial concerns identified at this time.       Expected Outcomes For [place] continue to be free of psychosocial concerns while participating in pulmonary rehab.       Interventions Encouraged to attend Pulmonary Rehabilitation for the exercise       Continue Psychosocial Services  No Follow up required              Psychosocial Discharge (Final Psychosocial Re-Evaluation):  Psychosocial Re-Evaluation - 03/14/21 1224      Psychosocial Re-Evaluation   Current issues with None Identified    Comments No psychosocial concerns identified at this time.    Expected Outcomes For [place] continue to be free of psychosocial concerns while participating in pulmonary rehab.    Interventions Encouraged to attend Pulmonary Rehabilitation for the exercise    Continue Psychosocial Services  No Follow up required           Education: Education Goals: Education classes will be provided on a weekly basis, covering required topics. Participant will state understanding/return demonstration of topics presented.  Learning Barriers/Preferences:  Learning Barriers/Preferences - 02/18/21 0949      Learning Barriers/Preferences   Learning Barriers None    Learning Preferences Written Material;Skilled Demonstration;Individual Instruction           Education Topics: Risk Factor Reduction:  -Group instruction that is supported by a PowerPoint presentation. Instructor discusses the definition of a risk factor, different risk factors for pulmonary disease, and how the heart and lungs work together.     Nutrition for Pulmonary Patient:  -Group instruction provided by PowerPoint slides, verbal discussion,  and written materials to support subject matter. The instructor gives an explanation and review of healthy diet recommendations, which includes a discussion on weight management, recommendations for fruit and vegetable consumption, as well as protein, fluid, caffeine, fiber, sodium,  sugar, and alcohol. Tips for eating when patients are short of breath are discussed.   Pursed Lip Breathing:  -Group instruction that is supported by demonstration and informational handouts. Instructor discusses the benefits of pursed lip and diaphragmatic breathing and detailed demonstration on how to preform both.     Oxygen Safety:  -Group instruction provided by PowerPoint, verbal discussion, and written material to support subject matter. There is an overview of "What is Oxygen" and "Why do we need it".  Instructor also reviews how to create a safe environment for oxygen use, the importance of using oxygen as prescribed, and the risks of noncompliance. There is a brief discussion on traveling with oxygen and resources the patient may utilize.   Oxygen Equipment:  -Group instruction provided by [facility] Staff utilizing handouts, written materials, and equipment demonstrations.   Signs and Symptoms:  -Group instruction provided by written material and verbal discussion to support subject matter. Warning signs and symptoms of infection, stroke, and heart attack are reviewed and when to call the physician/911 reinforced. Tips for preventing the spread of infection discussed.   Advanced Directives:  -Group instruction provided by verbal instruction and written material to support subject matter. Instructor reviews Advanced Directive laws and proper instruction for filling out document.   Pulmonary Video:  -Group video education that reviews the importance of medication and oxygen compliance, exercise, good nutrition, pulmonary hygiene, and pursed lip and diaphragmatic breathing for the pulmonary patient.   Exercise for the Pulmonary Patient:  -Group instruction that is supported by a PowerPoint presentation. Instructor discusses benefits of exercise, core components of exercise, frequency, duration, and intensity of an exercise routine, importance of utilizing pulse oximetry during  exercise, safety while exercising, and options of places to exercise outside of rehab.     Pulmonary Medications:  -Verbally interactive group education provided by instructor with focus on inhaled medications and proper administration.   Anatomy and Physiology of the Respiratory System and Intimacy:  -Group instruction provided by PowerPoint, verbal discussion, and written material to support subject matter. Instructor reviews respiratory cycle and anatomical components of the respiratory system and their functions. Instructor also reviews differences in obstructive and restrictive respiratory diseases with examples of each. Intimacy, Sex, and Sexuality differences are reviewed with a discussion on how relationships can change when diagnosed with pulmonary disease. Common sexual concerns are reviewed. Flowsheet Row PULMONARY REHAB OTHER RESPIRATORY from 02/24/2021 in [place]  Date 02/24/21  Educator handout      MD DAY -A group question and answer session with a medical doctor that allows participants to ask questions that relate to their pulmonary disease state.   OTHER EDUCATION -Group or individual verbal, written, or video instructions that support the educational goals of the pulmonary rehab program.   Holiday Eating Survival Tips:  -Group instruction provided by PowerPoint slides, verbal discussion, and written materials to support subject matter. The instructor gives patients tips, tricks, and techniques to help them not only survive but enjoy the holidays despite the onslaught of food that accompanies the holidays.   Knowledge Questionnaire Score:   Core Components/Risk Factors/Patient Goals at Admission:  Personal Goals and Risk Factors at Admission - 02/18/21 1212      Core Components/Risk Factors/Patient Goals  on Admission    Weight Management Yes;Weight Loss    Expected Outcomes Weight Loss: Understanding of general recommendations for  a balanced deficit meal plan, which promotes 1-2 lb weight loss per week and includes a negative energy balance of [phone] kcal/d;Long Term: Adherence to nutrition and physical activity/exercise program aimed toward attainment of established weight goal    Improve shortness of breath with ADL's Yes    Intervention Provide education, individualized exercise plan and daily activity instruction to help decrease symptoms of SOB with activities of daily living.    Expected Outcomes Short Term: Improve cardiorespiratory fitness to achieve a reduction of symptoms when performing ADLs;Long Term: Be able to perform more ADLs without symptoms or delay the onset of symptoms           Core Components/Risk Factors/Patient Goals Review:   Goals and Risk Factor Review    Row Name 03/14/21 1225             Core Components/Risk Factors/Patient Goals Review   Personal Goals Review Develop more efficient breathing techniques such as purse lipped breathing and diaphragmatic breathing and practicing self-pacing with activity.;Increase knowledge of respiratory medications and ability to use respiratory devices properly.;Improve shortness of breath with ADL's       Review Daniele has been using supplemental oxygen @ 4L/min when exercising since January 2022 which has been a challenge, but she is adjusting.  She is very active in addition to exercising in pulmonary rehab.  She line dances with a group, and [place].  She is exercising @ 2.7 mets on the nustep and walking 3 mph on the treadmill.       Expected Outcomes See admission goals.              Core Components/Risk Factors/Patient Goals at Discharge (Final Review):   Goals and Risk Factor Review - 03/14/21 1225      Core Components/Risk Factors/Patient Goals Review   Personal Goals Review Develop more efficient breathing techniques such as purse lipped breathing and diaphragmatic breathing and practicing self-pacing  with activity.;Increase knowledge of respiratory medications and ability to use respiratory devices properly.;Improve shortness of breath with ADL's    Review Alexa has been using supplemental oxygen @ 4L/min when exercising since January 2022 which has been a challenge, but she is adjusting.  She is very active in addition to exercising in pulmonary rehab.  She line dances with a group, and [place].  She is exercising @ 2.7 mets on the nustep and walking 3 mph on the treadmill.    Expected Outcomes See admission goals.           ITP Comments:   Comments: ITP REVIEW Pt is making expected progress toward pulmonary rehab goals after completing 4 sessions. Recommend continued exercise, life style modification, education, and utilization of breathing techniques to increase stamina and strength and decrease shortness of breath with exertion.

## 2021-03-15 NOTE — Telephone Encounter (Signed)
error 

## 2021-03-15 NOTE — Progress Notes (Signed)
Daily Session Note  Patient Details  Name: Maria [name] MRN: [id number] Date of Birth: 18-Feb-1941 Referring Provider:   April Manson Pulmonary Rehab Walk Test from 02/18/2021 in [place]  Referring Provider Dr. Vaughan Browner      Encounter Date: 03/15/2021  Check In:  Session Check In - 03/15/21 1104      Check-In   Supervising physician immediately available to respond to emergencies [facility] immediately available    Physician(s) Dr. Tawanna Solo    Location [facility]    Staff Present Rosebud Poles, RN, BSN;Lisa Ysidro Evert, RN;Jessica Hassell Done, MS, ACSM-CEP, Exercise Physiologist    Virtual Visit No    Medication changes reported     No    Fall or balance concerns reported    No    Tobacco Cessation No Change    Warm-up and Cool-down Performed on first and last piece of equipment    Resistance Training Performed Yes    VAD Patient? No      Pain Assessment   Currently in Pain? No/denies    Multiple Pain Sites No           Capillary Blood Glucose: No results found for this or any previous visit (from the past 24 hour(s)).    Social History   Tobacco Use  Smoking Status Former Smoker  . Packs/day: 1.00  . Years: 10.00  . Pack years: 10.00  . Types: Cigarettes  . Quit date: 12/18/1969  . Years since quitting: 51.2  Smokeless Tobacco Never Used    Goals Met:  Proper associated with RPD/PD & O2 Sat Exercise tolerated well Strength training completed today  Goals Unmet:  Not Applicable  Comments: Service time is from 1055 to 1148    Dr. Fransico Him is Medical Director for Cardiac Rehab at [facility].

## 2021-03-17 ENCOUNTER — Encounter (HOSPITAL_COMMUNITY)
Admission: RE | Admit: 2021-03-17 | Discharge: 2021-03-17 | Disposition: A | Discharge status: Home / Self Care | Payer: Medicare HMO | Source: Ambulatory Visit | Attending: Pulmonary Disease | Admitting: Pulmonary Disease

## 2021-03-17 ENCOUNTER — Other Ambulatory Visit: Payer: Self-pay

## 2021-03-17 DIAGNOSIS — J84112 Idiopathic pulmonary fibrosis: Secondary | ICD-10-CM | POA: Diagnosis not present

## 2021-03-17 MED FILL — ESBRIET 267 MG CAPSULE: 267 | 30 days supply | Qty: 270 | Fill #4

## 2021-03-17 NOTE — Progress Notes (Signed)
Daily Session Note  Patient Details  Name: Maria [name] MRN: [id number] Date of Birth: 07/10/1941 Referring Provider:   April Manson Pulmonary Rehab Walk Test from 02/18/2021 in [place]  Referring Provider Dr. Vaughan Browner      Encounter Date: 03/17/2021  Check In:  Session Check In - 03/17/21 1107      Check-In   Supervising physician immediately available to respond to emergencies [facility] immediately available    Physician(s) Dr. Florencia Reasons    [facility]    Staff Present Rosebud Poles, RN, BSN;Lisa Ysidro Evert, RN;Jessica Hassell Done, MS, ACSM-CEP, Exercise Physiologist    Virtual Visit No    Medication changes reported     No    Fall or balance concerns reported    No    Tobacco Cessation No Change    Warm-up and Cool-down Performed on first and last piece of equipment    Resistance Training Performed Yes    VAD Patient? No    PAD/SET Patient? No      Pain Assessment   Currently in Pain? No/denies    Multiple Pain Sites No           Capillary Blood Glucose: No results found for this or any previous visit (from the past 24 hour(s)).    Social History   Tobacco Use  Smoking Status Former Smoker  . Packs/day: 1.00  . Years: 10.00  . Pack years: 10.00  . Types: Cigarettes  . Quit date: 12/18/1969  . Years since quitting: 51.2  Smokeless Tobacco Never Used    Goals Met:  Proper associated with RPD/PD & O2 Sat Exercise tolerated well Strength training completed today  Goals Unmet:  Not Applicable  Comments: Service time is from 1045 to 1140.    Dr. Fransico Him is Medical Director for Cardiac Rehab at [facility]

## 2021-03-22 ENCOUNTER — Other Ambulatory Visit: Payer: Self-pay

## 2021-03-22 ENCOUNTER — Encounter (HOSPITAL_COMMUNITY)
Admission: RE | Admit: 2021-03-22 | Discharge: 2021-03-22 | Disposition: A | Discharge status: Home / Self Care | Payer: Medicare HMO | Source: Ambulatory Visit | Attending: Pulmonary Disease | Admitting: Pulmonary Disease

## 2021-03-22 VITALS — Wt 167.5 lb

## 2021-03-22 DIAGNOSIS — J84112 Idiopathic pulmonary fibrosis: Secondary | ICD-10-CM | POA: Diagnosis not present

## 2021-03-22 NOTE — Progress Notes (Signed)
Daily Session Note  Patient Details  Name: Maria [name] MRN: [id number] Date of Birth: Sep 15, 1941 Referring Provider:   April Manson Pulmonary Rehab Walk Test from 02/18/2021 in [place]  Referring Provider Dr. Vaughan Browner      Encounter Date: 03/22/2021  Check In:  Session Check In - 03/22/21 1123      Check-In   Supervising physician immediately available to respond to emergencies [facility] immediately available    Physician(s) Dr. Tawanna Solo    [facility]    Staff Present Maurice Small, RN, BSN;Lisa Ysidro Evert, RN;Kiva Norland Hassell Done, MS, ACSM-CEP, Exercise Physiologist    Virtual Visit No    Medication changes reported     No    Fall or balance concerns reported    No    Tobacco Cessation No Change    Warm-up and Cool-down Performed on first and last piece of equipment    Resistance Training Performed Yes    VAD Patient? No    PAD/SET Patient? No      Pain Assessment   Currently in Pain? No/denies    Pain Score 0-No pain    Multiple Pain Sites No           Capillary Blood Glucose: No results found for this or any previous visit (from the past 24 hour(s)).   Exercise Prescription Changes - 03/22/21 1200      Response to Exercise   Blood Pressure (Admit) 146/70    Blood Pressure (Exercise) 150/82    Blood Pressure (Exit) 128/70    Heart Rate (Admit) 89 bpm    Heart Rate (Exercise) 110 bpm    Heart Rate (Exit) 95 bpm    Oxygen Saturation (Admit) 99 %    Oxygen Saturation (Exercise) 96 %    Oxygen Saturation (Exit) 98 %    Rating of Perceived Exertion (Exercise) 8    Perceived Dyspnea (Exercise) 0    Duration Progress to 45 minutes of aerobic exercise without signs/symptoms of physical distress    Intensity THRR unchanged      Progression   Progression Continue to progress workloads to maintain intensity without signs/symptoms of physical distress.      Resistance Training   Training Prescription  Yes    Weight orange bands    Reps 10-15    Time 10 Minutes      Oxygen   Oxygen Continuous    Liters 4      Treadmill   MPH 3    Grade 0    Minutes 15      NuStep   Level 3    SPM 80    Minutes 15    METs 2.7           Social History   Tobacco Use  Smoking Status Former Smoker  . Packs/day: 1.00  . Years: 10.00  . Pack years: 10.00  . Types: Cigarettes  . Quit date: 12/18/1969  . Years since quitting: 51.2  Smokeless Tobacco Never Used    Goals Met:  Proper associated with RPD/PD & O2 Sat Independence with exercise equipment Exercise tolerated well Strength training completed today  Goals Unmet:  Not Applicable  Comments: Service time is from 1105 to 1200    Dr. Fransico Him is Medical Director for Cardiac Rehab at [facility]

## 2021-03-24 ENCOUNTER — Other Ambulatory Visit: Payer: Self-pay

## 2021-03-24 ENCOUNTER — Encounter (HOSPITAL_COMMUNITY)
Admission: RE | Admit: 2021-03-24 | Discharge: 2021-03-24 | Disposition: A | Discharge status: Home / Self Care | Payer: Medicare HMO | Source: Ambulatory Visit | Attending: Pulmonary Disease | Admitting: Pulmonary Disease

## 2021-03-24 DIAGNOSIS — J84112 Idiopathic pulmonary fibrosis: Secondary | ICD-10-CM

## 2021-03-24 NOTE — Progress Notes (Signed)
Daily Session Note  Patient Details  Name: Maria [name] MRN: [id number] Date of Birth: [birth year] Referring Provider:   April Manson Pulmonary Rehab Walk Test from 02/18/2021 in [place]  Referring Provider Dr. Vaughan Browner      Encounter Date: 03/24/2021  Check In:  Session Check In - 03/24/21 1109      Check-In   Supervising physician immediately available to respond to emergencies [facility] immediately available    Physician(s) Dr. Pricilla Loveless    [facility]    Staff Present Rosebud Poles, RN, BSN;Lisa Ysidro Evert, RN;Keishawn Rajewski Hassell Done, MS, ACSM-CEP, Exercise Physiologist    Virtual Visit No    Medication changes reported     No    Fall or balance concerns reported    No    Tobacco Cessation No Change    Warm-up and Cool-down Performed on first and last piece of equipment    Resistance Training Performed Yes    VAD Patient? No    PAD/SET Patient? No      Pain Assessment   Currently in Pain? No/denies    Multiple Pain Sites No           Capillary Blood Glucose: No results found for this or any previous visit (from the past 24 hour(s)).    Social History   Tobacco Use  Smoking Status Former Smoker  . Packs/day: 1.00  . Years: 10.00  . Pack years: 10.00  . Types: Cigarettes  . Quit date: 12/18/1969  . Years since quitting: 51.2  Smokeless Tobacco Never Used    Goals Met:  Proper associated with RPD/PD & O2 Sat Independence with exercise equipment Exercise tolerated well Strength training completed today  Goals Unmet:  Not Applicable  Comments: Service time is from 1043 to 1145    Dr. Fransico Him is Medical Director for Cardiac Rehab at [facility]

## 2021-03-27 DIAGNOSIS — J849 Interstitial pulmonary disease, unspecified: Secondary | ICD-10-CM | POA: Diagnosis not present

## 2021-03-27 DIAGNOSIS — G4733 Obstructive sleep apnea (adult) (pediatric): Secondary | ICD-10-CM | POA: Diagnosis not present

## 2021-03-29 ENCOUNTER — Encounter (HOSPITAL_COMMUNITY)
Admission: RE | Admit: 2021-03-29 | Discharge: 2021-03-29 | Disposition: A | Discharge status: Home / Self Care | Payer: Medicare HMO | Source: Ambulatory Visit | Attending: Pulmonary Disease | Admitting: Pulmonary Disease

## 2021-03-29 ENCOUNTER — Other Ambulatory Visit: Payer: Self-pay

## 2021-03-29 DIAGNOSIS — J84112 Idiopathic pulmonary fibrosis: Secondary | ICD-10-CM | POA: Diagnosis not present

## 2021-03-29 NOTE — Progress Notes (Signed)
Daily Session Note  Patient Details  Name: Maria [name] MRN: [id number] Date of Birth: 1941-10-23 Referring Provider:   April Manson Pulmonary Rehab Walk Test from 02/18/2021 in [place]  Referring Provider Dr. Vaughan Browner      Encounter Date: 03/29/2021  Check In:  Session Check In - 03/29/21 1120      Check-In   Supervising physician immediately available to respond to emergencies [facility] immediately available    Physician(s) Dr. Tawanna Solo    [facility]    Staff Present Rosebud Poles, RN, Isaac Laud, MS, ACSM-CEP, Exercise Physiologist    Virtual Visit No    Medication changes reported     No    Fall or balance concerns reported    No    Tobacco Cessation No Change    Warm-up and Cool-down Performed on first and last piece of equipment    Resistance Training Performed Yes    VAD Patient? No    PAD/SET Patient? No      Pain Assessment   Currently in Pain? No/denies    Multiple Pain Sites No           Capillary Blood Glucose: No results found for this or any previous visit (from the past 24 hour(s)).    Social History   Tobacco Use  Smoking Status Former Smoker  . Packs/day: 1.00  . Years: 10.00  . Pack years: 10.00  . Types: Cigarettes  . Quit date: 12/18/1969  . Years since quitting: 51.3  Smokeless Tobacco Never Used    Goals Met:  Proper associated with RPD/PD & O2 Sat Exercise tolerated well Strength training completed today  Goals Unmet:  Not Applicable  Comments: Service time is from 1050 to 1155.    Dr. Fransico Him is Medical Director for Cardiac Rehab at [facility].

## 2021-03-31 ENCOUNTER — Encounter (HOSPITAL_COMMUNITY)
Admission: RE | Admit: 2021-03-31 | Discharge: 2021-03-31 | Disposition: A | Discharge status: Home / Self Care | Payer: Medicare HMO | Source: Ambulatory Visit | Attending: Pulmonary Disease | Admitting: Pulmonary Disease

## 2021-03-31 ENCOUNTER — Other Ambulatory Visit: Payer: Self-pay

## 2021-03-31 DIAGNOSIS — J84112 Idiopathic pulmonary fibrosis: Secondary | ICD-10-CM

## 2021-03-31 NOTE — Progress Notes (Signed)
Daily Session Note  Patient Details  Name: Maria [name] MRN: [id number] Date of Birth: 1941-01-19 Referring Provider:   April Manson Pulmonary Rehab Walk Test from 02/18/2021 in [place]  Referring Provider Dr. Vaughan Browner      Encounter Date: 03/31/2021  Check In:  Session Check In - 03/31/21 1105      Check-In   Supervising physician immediately available to respond to emergencies [facility] immediately available    Physician(s) Dr. Florencia Reasons    [facility]    Staff Present Rosebud Poles, RN, BSN;Lisa Ysidro Evert, RN;Jessica Hassell Done, MS, ACSM-CEP, Exercise Physiologist    Virtual Visit No    Medication changes reported     No    Fall or balance concerns reported    No    Tobacco Cessation No Change    Warm-up and Cool-down Performed on first and last piece of equipment    Resistance Training Performed Yes    VAD Patient? No    PAD/SET Patient? No      Pain Assessment   Currently in Pain? No/denies    Multiple Pain Sites No           Capillary Blood Glucose: No results found for this or any previous visit (from the past 24 hour(s)).    Social History   Tobacco Use  Smoking Status Former Smoker  . Packs/day: 1.00  . Years: 10.00  . Pack years: 10.00  . Types: Cigarettes  . Quit date: 12/18/1969  . Years since quitting: 51.3  Smokeless Tobacco Never Used    Goals Met:  Proper associated with RPD/PD & O2 Sat Exercise tolerated well Strength training completed today  Goals Unmet:  Not Applicable  Comments: Service time is from 1040 to 1140    Dr. Fransico Him is Medical Director for Cardiac Rehab at [facility].

## 2021-04-04 ENCOUNTER — Ambulatory Visit (INDEPENDENT_AMBULATORY_CARE_PROVIDER_SITE_OTHER): Payer: Medicare HMO | Admitting: Pulmonary Disease

## 2021-04-04 ENCOUNTER — Encounter: Payer: Self-pay | Admitting: Pulmonary Disease

## 2021-04-04 ENCOUNTER — Other Ambulatory Visit: Payer: Self-pay

## 2021-04-04 VITALS — BP 134/68 | HR 63 | Temp 97.3°F | Ht 62.0 in | Wt 168.0 lb

## 2021-04-04 DIAGNOSIS — J84112 Idiopathic pulmonary fibrosis: Secondary | ICD-10-CM

## 2021-04-04 DIAGNOSIS — I272 Pulmonary hypertension, unspecified: Secondary | ICD-10-CM

## 2021-04-04 DIAGNOSIS — R0600 Dyspnea, unspecified: Secondary | ICD-10-CM

## 2021-04-04 DIAGNOSIS — R0609 Other forms of dyspnea: Secondary | ICD-10-CM

## 2021-04-04 NOTE — Patient Instructions (Signed)
I am glad you are tolerating the [place] well Your liver tests from last month are normal  Continue pulmonary rehab and exercise Continue supplemental oxygen and CPAP  Please make referral to [facility] ILD clinic for second opinion regarding pulmonary fibrosis Please discuss with the [place] doctor about referral to cardiology for right heart catheterization for evaluation of pulmonary hypertension  We will give handicap placard  Follow-up in 3 months.

## 2021-04-04 NOTE — Addendum Note (Signed)
Addended by: Elton Sin on: 04/04/2021 10:54 AM   Modules accepted: Orders

## 2021-04-04 NOTE — Progress Notes (Signed)
Maria [name]    [id number]    May 18, 1941  Primary Care Physician:John, Hunt Oris, MD  Referring Physician: Biagio Borg, MD [address] [place],  [place] [address]  Chief complaint: Follow-up for  IPF Started Esbriet September 6530  HPI: 80 year old with history of hypertension, stroke, sleep apnea. Referred for evaluation of interstitial lung disease This was noted on CT scan done in 2018.  Notes increasing cough, dyspnea over the past few months.  Symptoms worsen after COVID-19 vaccination.  No mucus production, wheezing or chills.  She had an exposure to DDT as a child ( middle School) and had severe respiratory problems ever since, including multiple flu illnesses that would take her 6-8 weeks to recover from.  Pets: No pets Occupation: Retired Quarry manager at [employer] Exposures: Reports exposure to DDD as a child and to acids in her lab at work but no known ongoing exposures.  No mold, hot tub, Jacuzzi.  No asbestos, down pillows or comforters Smoking history: 5-pack-year smoker.  Quit in 1971 Travel history: No significant travel history Relevant family history: Grandfather had emphysema.  He was a pipe smoker  Interim history: Tolerating Esbriet well.  She has occasional nausea Continues on supplemental oxygen at 4 L, on CPAP at home She has started pulmonary rehab and notes improvement in exercise capacity and dyspnea  Remains active with hiking and water aerobics.  Outpatient Encounter Medications as of 04/04/2021  Medication Sig  . amLODipine (NORVASC) 5 MG tablet TAKE 1 TABLET EVERY DAY  . b complex vitamins tablet Take 1 tablet by mouth daily.  Marland Kitchen CALCIUM PO Take by mouth daily.  . Cholecalciferol (VITAMIN D-3 PO) Take by mouth daily.  . clopidogrel (PLAVIX) 75 MG tablet TAKE 1 TABLET EVERY DAY  . co-enzyme Q-10 30 MG capsule Take 30 mg by mouth 3 (three) times daily.  . Collagen Hydrolysate POWD 1 Scoop by Does not apply route  daily.  . ferrous sulfate 324 MG TBEC Take 324 mg by mouth every other day.  . hydrochlorothiazide (MICROZIDE) 12.5 MG capsule TAKE 1 CAPSULE EVERY DAY  . levocetirizine (XYZAL) 5 MG tablet TAKE 1 TABLET EVERY EVENING  . LUTEIN PO Take by mouth daily.  . montelukast (SINGULAIR) 10 MG tablet TAKE 1 TABLET AT BEDTIME  . Multiple Vitamins-Minerals (WOMENS MULTIVITAMIN PO) Take 1 capsule by mouth daily.  Marland Kitchen oxybutynin (DITROPAN-XL) 10 MG 24 hr tablet TAKE 1 TABLET BY MOUTH Q DAILY  . pantoprazole (PROTONIX) 40 MG tablet Take 1 tablet (40 mg total) by mouth 2 (two) times daily.  . Pirfenidone 267 MG CAPS TAKE 3 CAPSULES BY MOUTH WITH BREAKFAST, WITH LUNCH, AND WITH EVENING MEAL.  Marland Kitchen potassium chloride (KLOR-CON) 10 MEQ tablet TAKE 1 TABLET EVERY DAY  . simvastatin (ZOCOR) 10 MG tablet TAKE 1 TABLET EVERY DAY  AT  6  PM  . trimethoprim (TRIMPEX) 100 MG tablet Take 100 mg by mouth daily.   . Turmeric 500 MG CAPS Take 1 capsule by mouth daily.  Marland Kitchen zinc gluconate 50 MG tablet Take 50 mg by mouth daily.  . [DISCONTINUED] Pirfenidone 267 MG CAPS TAKE 3 CAPSULES BY MOUTH WITH BREAKFAST, WITH LUNCH, AND WITH EVENING MEAL.  Marland Kitchen albuterol (PROAIR HFA) 108 (90 Base) MCG/ACT inhaler INHALE 2 PUFFS INTO THE LUNCH EVERY 6 HOURS AS NEEDED FOR WHEEZING OR SHORTNESS OF BREATH (Patient not taking: Reported on 04/04/2021)  . budesonide-formoterol (SYMBICORT) 160-4.5 MCG/ACT inhaler Inhale 2  puffs into the lungs 2 (two) times daily. (Patient not taking: No sig reported)   No facility-administered encounter medications on file as of 04/04/2021.   Physical Exam: Blood pressure 134/68, pulse 63, temperature (!) 97.3 F (36.3 C), temperature source Temporal, height _0  (1.575 m), weight 168 lb (76.2 kg), SpO2 100 %. Gen:      No acute distress HEENT:  EOMI, sclera anicteric Neck:     No masses; no thyromegaly Lungs:    Bibasal crackles CV:         Regular rate and rhythm; no murmurs Abd:      + bowel sounds; soft,  non-tender; no palpable masses, no distension Ext:    No edema; adequate peripheral perfusion Skin:      Warm and dry; no rash Neuro: alert and oriented x 3 Psych: normal mood and affect  Data Reviewed: Imaging: High-resolution CT 03/29/2017-basal gradient fibrotic changes with reticulation, traction bronchiectasis.  Moderate air trapping.  Probable UIP pattern.  High-res CT 04/15/2020-mildly progressive pulmonary fibrosis probable UIP pattern. I have reviewed the images personally.  PFTs: 04/17/2017 FVC 1.69 [64%], FEV1 1.45 [73%], F/F 86, TLC 3.72 [76%], DLCO 14.90 [65%]  06/17/2020 FVC 1.72 [68%], FEV1 1.44 [76%], F/F 84, TLC 3.26 [66%], DLCO 14.53 [79%]  Moderate restriction with diffusion defect.  Slightly worsened compared to 2018  6-minute walk test 06/30/2020- 443 m, nadir O2 sat of 90%  Labs: RAST panel 04/27/2015-IgE 40, sensitive to grass, tree pollen CBC 02/18/2018-WBC 6.2, eos 3.9%, absolute eosinophil count 242  N-terminal proBNP 09/10/2019 1-24  ILD serologies 04/09/2020-atypical p-ANCA 1:80  Sleep: PSG 07/31/17, AHI 6.4, SpO2 low 85%. CPAP titration 11/15/2017 CPAP 7  Assessment:  Pulmonary fibrosis Probable UIP pattern.  This is likely IPF with minimal exposures.  Serologies show mild elevation in ANCA which is likely nonspecific with no other signs and symptoms of autoimmune process.  Discussed findings in detail.  To make a diagnosis with certainty we would need a lung biopsy but not recommended given her age. Given mild progression on CT scan and PFTs with worsening symptoms we have decided to treat with antifibrotic Continue Esbriet Monitor LFTs  Continue supplemental oxygen with portable concentrator Continue pulmonary rehab, exercise therapy.  Give handicap sticker application today  She is requesting referral to [facility] for second opinion.  Mild pulmonary hypertension Noted on echocardiogram.  proBNP is normal Patient wanted to hold off on right heart  cath earlier but is ready to proceed now but would prefer to get this [facility] at [facility]  I have asked her to discuss at her [facility] visit for referral to cardiology for right heart catheterization  Chronic cough Secondary to pulmonary fibrosis, GERD Continue PPI, Tessalon Advised to raise head of the bed and not lay down at least 2 hours after eating  OSA Stable Download reviewed with good compliance  Plan/Recommendations: - Continue Esbriet - Pulmonary rehab, handicap placard - Referral to [place] [facility] - Pulmonary rehab  Marshell Garfinkel MD [facility] 04/04/2021, 10:09 AM  CC: Biagio Borg, MD

## 2021-04-04 NOTE — Addendum Note (Signed)
Addended by: Elton Sin on: 04/04/2021 10:52 AM   Modules accepted: Orders

## 2021-04-05 ENCOUNTER — Encounter (HOSPITAL_COMMUNITY)
Admission: RE | Admit: 2021-04-05 | Discharge: 2021-04-05 | Disposition: A | Discharge status: Home / Self Care | Payer: Medicare HMO | Source: Ambulatory Visit | Attending: Pulmonary Disease | Admitting: Pulmonary Disease

## 2021-04-05 VITALS — Wt 166.9 lb

## 2021-04-05 DIAGNOSIS — J84112 Idiopathic pulmonary fibrosis: Secondary | ICD-10-CM | POA: Diagnosis not present

## 2021-04-05 NOTE — Progress Notes (Signed)
I have reviewed a Home Exercise Prescription with Fareeda A Gambill . Angelicia is currently exercising at home. Pt participates in line dancing and water aerobics at a community center. Encouraged pt to look into a gym membership for aerobic and resistance exercise after completion of pulmonary rehab. The patient was advised to walk and do resistance training 2-3 additional days a week for 30-60 minutes.  Faiza and I discussed how to progress their exercise prescription. The patient stated that their goals were to lose weight and to live as long as possible. The patient stated that they understand the exercise prescription.  We reviewed exercise guidelines, target heart rate during exercise, RPE Scale, weather conditions, rescue inhaler use, endpoints for exercise, warmup and cool down.  Patient is encouraged to come to me with any questions. I will continue to follow up with the patient to assist them with progression and safety.    Rick Duff MS, ACSM CEP 4:16 PM 04/05/2021

## 2021-04-05 NOTE — Progress Notes (Signed)
Daily Session Note  Patient Details  Name: Maria [name] MRN: [id number] Date of Birth: 1941/07/15 Referring Provider:   April Manson Pulmonary Rehab Walk Test from 02/18/2021 in [place]  Referring Provider Dr. Vaughan Browner      Encounter Date: 04/05/2021  Check In:  Session Check In - 04/05/21 1109      Check-In   Supervising physician immediately available to respond to emergencies [facility] immediately available    Physician(s) Dr. Tawanna Solo    Location [facility]    Staff Present Rosebud Poles, RN, BSN;Kalil Woessner Ysidro Evert, RN;Jessica Hassell Done, MS, ACSM-CEP, Exercise Physiologist    Virtual Visit No    Medication changes reported     No    Fall or balance concerns reported    No    Tobacco Cessation No Change    Warm-up and Cool-down Performed on first and last piece of equipment    Resistance Training Performed Yes    VAD Patient? No    PAD/SET Patient? No      Pain Assessment   Currently in Pain? No/denies    Multiple Pain Sites No           Capillary Blood Glucose: No results found for this or any previous visit (from the past 24 hour(s)).   Exercise Prescription Changes - 04/05/21 1500      Response to Exercise   Blood Pressure (Admit) 136/80    Blood Pressure (Exercise) 138/70    Blood Pressure (Exit) 132/80    Heart Rate (Admit) 78 bpm    Heart Rate (Exercise) 102 bpm    Heart Rate (Exit) 83 bpm    Oxygen Saturation (Admit) 100 %    Oxygen Saturation (Exercise) 96 %    Oxygen Saturation (Exit) 96 %    Rating of Perceived Exertion (Exercise) 8    Perceived Dyspnea (Exercise) 1    Duration Continue with 30 min of aerobic exercise without signs/symptoms of physical distress.    Intensity THRR unchanged      Progression   Progression Continue to progress workloads to maintain intensity without signs/symptoms of physical distress.      Resistance Training   Training Prescription Yes    Weight orange bands     Reps 10-15    Time 10 Minutes      Oxygen   Oxygen Continuous    Liters 4      Treadmill   MPH 3    Grade 0    Minutes 15      NuStep   Level 3    SPM 80    Minutes 15    METs 2.6           Social History   Tobacco Use  Smoking Status Former Smoker  . Packs/day: 1.00  . Years: 10.00  . Pack years: 10.00  . Types: Cigarettes  . Quit date: 12/18/1969  . Years since quitting: 51.3  Smokeless Tobacco Never Used    Goals Met:  Exercise tolerated well No report of cardiac concerns or symptoms Strength training completed today  Goals Unmet:  Not Applicable  Comments: Service time is from 1045 to 1150    Dr. Fransico Him is Medical Director for Cardiac Rehab at [facility].

## 2021-04-07 ENCOUNTER — Encounter (HOSPITAL_COMMUNITY)
Admission: RE | Admit: 2021-04-07 | Discharge: 2021-04-07 | Disposition: A | Discharge status: Home / Self Care | Payer: Medicare HMO | Source: Ambulatory Visit | Attending: Pulmonary Disease | Admitting: Pulmonary Disease

## 2021-04-07 ENCOUNTER — Other Ambulatory Visit: Payer: Self-pay

## 2021-04-07 DIAGNOSIS — J84112 Idiopathic pulmonary fibrosis: Secondary | ICD-10-CM | POA: Diagnosis not present

## 2021-04-07 NOTE — Progress Notes (Signed)
Daily Session Note  Patient Details  Name: Maria [name] MRN: [id number] Date of Birth: May 12, 1941 Referring Provider:   April Manson Pulmonary Rehab Walk Test from 02/18/2021 in [place]  Referring Provider Dr. Vaughan Browner      Encounter Date: 04/07/2021  Check In:  Session Check In - 04/07/21 1114      Check-In   Supervising physician immediately available to respond to emergencies [facility] immediately available    Physician(s) Dr. Tawanna Solo    Location [facility]    Staff Present Rosebud Poles, RN, BSN;Lisa Ysidro Evert, RN;Jessica Hassell Done, MS, ACSM-CEP, Exercise Physiologist    Virtual Visit No    Medication changes reported     No    Fall or balance concerns reported    No    Tobacco Cessation No Change    Warm-up and Cool-down Performed on first and last piece of equipment    Resistance Training Performed Yes    VAD Patient? No    PAD/SET Patient? No      Pain Assessment   Currently in Pain? No/denies    Multiple Pain Sites No           Capillary Blood Glucose: No results found for this or any previous visit (from the past 24 hour(s)).    Social History   Tobacco Use  Smoking Status Former Smoker  . Packs/day: 1.00  . Years: 10.00  . Pack years: 10.00  . Types: Cigarettes  . Quit date: 12/18/1969  . Years since quitting: 51.3  Smokeless Tobacco Never Used    Goals Met:  Proper associated with RPD/PD & O2 Sat Exercise tolerated well Strength training completed today  Goals Unmet:  Not Applicable  Comments: Service time is from 1045 to 1150.    Dr. Fransico Him is Medical Director for Cardiac Rehab at [facility].

## 2021-04-11 ENCOUNTER — Other Ambulatory Visit (HOSPITAL_COMMUNITY): Payer: Self-pay

## 2021-04-11 ENCOUNTER — Telehealth: Payer: Self-pay | Admitting: Pulmonary Disease

## 2021-04-11 MED FILL — Pirfenidone Cap 267 MG: ORAL | 30 days supply | Qty: 270 | Fill #0 | Status: AC

## 2021-04-11 NOTE — Telephone Encounter (Signed)
Checked orders that were placed after pt's last OV and order was placed for pt to be referred to [facility].  Attempted to call pt but unable to reach. Left message for her to return call.

## 2021-04-11 NOTE — Telephone Encounter (Signed)
Spoke with pt who states she tried to set up an appointment with [facility] center but center is stating they have not received referral. Spoke with [facility] who stated they have received referral and are awaiting pt to schedule appointment. Spoke with pt and informed her that [facility] was waiting on her call to schedule. Pt stated understanding. Nothing further needed at this point.

## 2021-04-12 ENCOUNTER — Other Ambulatory Visit: Payer: Self-pay

## 2021-04-12 ENCOUNTER — Encounter (HOSPITAL_COMMUNITY)
Admission: RE | Admit: 2021-04-12 | Discharge: 2021-04-12 | Disposition: A | Discharge status: Home / Self Care | Payer: Medicare HMO | Source: Ambulatory Visit | Attending: Pulmonary Disease | Admitting: Pulmonary Disease

## 2021-04-12 DIAGNOSIS — J84112 Idiopathic pulmonary fibrosis: Secondary | ICD-10-CM | POA: Diagnosis not present

## 2021-04-12 NOTE — Progress Notes (Signed)
Pulmonary Individual Treatment Plan  Patient Details  Name: Maria [name] MRN: [id number] Date of Birth: 1941-04-03 Referring Provider:   April Manson Pulmonary Rehab Walk Test from 02/18/2021 in [place]  Referring Provider Dr. Vaughan Browner      Initial Encounter Date:  Flowsheet Row Pulmonary Rehab Walk Test from 02/18/2021 in [place]  Date 02/18/21      Visit Diagnosis: IPF (idiopathic pulmonary fibrosis) ([place])  Patient's Home Medications on Admission:   Current Outpatient Medications:  .  albuterol (PROAIR HFA) 108 (90 Base) MCG/ACT inhaler, INHALE 2 PUFFS INTO THE LUNCH EVERY 6 HOURS AS NEEDED FOR WHEEZING OR SHORTNESS OF BREATH (Patient not taking: Reported on 04/04/2021), Disp: 18 g, Rfl: 2 .  amLODipine (NORVASC) 5 MG tablet, TAKE 1 TABLET EVERY DAY, Disp: 90 tablet, Rfl: 2 .  b complex vitamins tablet, Take 1 tablet by mouth daily., Disp: , Rfl:  .  CALCIUM PO, Take by mouth daily., Disp: , Rfl:  .  Cholecalciferol (VITAMIN D-3 PO), Take by mouth daily., Disp: , Rfl:  .  clopidogrel (PLAVIX) 75 MG tablet, TAKE 1 TABLET EVERY DAY, Disp: 90 tablet, Rfl: 2 .  co-enzyme Q-10 30 MG capsule, Take 30 mg by mouth 3 (three) times daily., Disp: , Rfl:  .  Collagen Hydrolysate POWD, 1 Scoop by Does not apply route daily., Disp: , Rfl:  .  ferrous sulfate 324 MG TBEC, Take 324 mg by mouth every other day., Disp: , Rfl:  .  hydrochlorothiazide (MICROZIDE) 12.5 MG capsule, TAKE 1 CAPSULE EVERY DAY, Disp: 90 capsule, Rfl: 2 .  levocetirizine (XYZAL) 5 MG tablet, TAKE 1 TABLET EVERY EVENING, Disp: 90 tablet, Rfl: 3 .  LUTEIN PO, Take by mouth daily., Disp: , Rfl:  .  montelukast (SINGULAIR) 10 MG tablet, TAKE 1 TABLET AT BEDTIME, Disp: 90 tablet, Rfl: 2 .  Multiple Vitamins-Minerals (WOMENS MULTIVITAMIN PO), Take 1 capsule by mouth daily., Disp: , Rfl:  .  oxybutynin (DITROPAN-XL) 10 MG 24 hr tablet, TAKE 1 TABLET BY MOUTH Q  DAILY, Disp: 90 tablet, Rfl: 3 .  pantoprazole (PROTONIX) 40 MG tablet, Take 1 tablet (40 mg total) by mouth 2 (two) times daily., Disp: 180 tablet, Rfl: 2 .  Pirfenidone 267 MG CAPS, TAKE 3 CAPSULES BY MOUTH WITH BREAKFAST, WITH LUNCH, AND WITH EVENING MEAL., Disp: 270 capsule, Rfl: 11 .  potassium chloride (KLOR-CON) 10 MEQ tablet, TAKE 1 TABLET EVERY DAY, Disp: 90 tablet, Rfl: 2 .  simvastatin (ZOCOR) 10 MG tablet, TAKE 1 TABLET EVERY DAY  AT  6  PM, Disp: 90 tablet, Rfl: 2 .  trimethoprim (TRIMPEX) 100 MG tablet, Take 100 mg by mouth daily. , Disp: , Rfl:  .  Turmeric 500 MG CAPS, Take 1 capsule by mouth daily., Disp: , Rfl:  .  zinc gluconate 50 MG tablet, Take 50 mg by mouth daily., Disp: , Rfl:   Past Medical History: Past Medical History:  Diagnosis Date  . Anemia   . Arthritis   . Bronchitis   . Carpal tunnel syndrome    in calves neuropathy  . Diverticulosis   . GERD (gastroesophageal reflux disease)   . Headache(784.0)    chronic  . High cholesterol   . Hyperlipidemia   . Hypertension   . Migraine   . Nephritis   . OSA (obstructive sleep apnea)   . Palpitations   . Personal history of colonic adenomas 09/15/2010  . Stroke ([place])   .  TIA (transient ischemic attack)   . Vertigo, benign paroxysmal     Tobacco Use: Social History   Tobacco Use  Smoking Status Former Smoker  . Packs/day: 1.00  . Years: 10.00  . Pack years: 10.00  . Types: Cigarettes  . Quit date: 12/18/1969  . Years since quitting: 51.3  Smokeless Tobacco Never Used    Labs: Recent Review Flowsheet Data    Labs for ITP Cardiac and Pulmonary Rehab Latest Ref Rng & Units 10/23/2013 11/10/2014 12/01/2016 02/18/2018 04/16/2020   Cholestrol 0 - 200 mg/dL 202(H) 154 137 - 137   LDLCALC 0 - 99 mg/dL - 80 66 - 56   LDLDIRECT mg/dL 113.2 - - - -   HDL >39.00 mg/dL 73.10 59.60 60.30 - 59.50   Trlycerides 0.0 - 149.0 mg/dL 102.0 74.0 52.0 - 107.0   Hemoglobin A1c 4.6 - 6.5 % 5.8 6.2 5.8 5.9 5.7       Capillary Blood Glucose: Lab Results  Component Value Date   GLUCAP 99 02/01/2011     Pulmonary Assessment Scores:  Pulmonary Assessment Scores    Row Name 02/18/21 1350         ADL UCSD   ADL Phase Entry     SOB Score total 72           CAT Score   CAT Score 22           mMRC Score   mMRC Score 4           UCSD: Self-administered rating of dyspnea associated with activities of daily living (ADLs) 6-point scale (0 = "not at all" to 5 = "maximal or unable to do because of breathlessness")  Scoring Scores range from 0 to 120.  Minimally important difference is 5 units  CAT: CAT can identify the health impairment of COPD patients and is better correlated with disease progression.  CAT has a scoring range of zero to 40. The CAT score is classified into four groups of low (less than 10), medium (10 - 20), high (21-30) and very high (31-40) based on the impact level of disease on health status. A CAT score over 10 suggests significant symptoms.  A worsening CAT score could be explained by an exacerbation, poor medication adherence, poor inhaler technique, or progression of COPD or comorbid conditions.  CAT MCID is 2 points  mMRC: mMRC (Modified Medical Research Council) Dyspnea Scale is used to assess the degree of baseline functional disability in patients of respiratory disease due to dyspnea. No minimal important difference is established. A decrease in score of 1 point or greater is considered a positive change.   Pulmonary Function Assessment:  Pulmonary Function Assessment - 02/18/21 0946      Breath   Shortness of Breath Yes;Limiting activity           Exercise Target Goals: Exercise Program Goal: Individual exercise prescription set using results from initial 6 min walk test and THRR while considering  patient's activity barriers and safety.   Exercise Prescription Goal: Initial exercise prescription builds to 30-45 minutes a day of aerobic activity,  2-3 days per week.  Home exercise guidelines will be given to patient during program as part of exercise prescription that the participant will acknowledge.  Activity Barriers & Risk Stratification:  Activity Barriers & Cardiac Risk Stratification - 02/18/21 0942      Activity Barriers & Cardiac Risk Stratification   Activity Barriers Back Problems;Deconditioning;Shortness of Breath;Left Knee Replacement;Right Knee Replacement  6 Minute Walk:  6 Minute Walk    Row Name 02/18/21 1217         6 Minute Walk   Phase Initial     Distance 1348 feet     Walk Time 6 minutes     # of Rest Breaks 0     MPH 2.55     METS 2.39     RPE 8     Perceived Dyspnea  1     VO2 Peak 8.35     Symptoms No     Resting HR 59 bpm     Resting BP 128/64     Resting Oxygen Saturation  99 %     Exercise Oxygen Saturation  during 6 min walk 94 %     Max Ex. HR 105 bpm     Max Ex. BP 152/78     2 Minute Post BP 122/74           Interval HR   1 Minute HR 96     2 Minute HR 98     3 Minute HR 100     4 Minute HR 99     5 Minute HR 105     6 Minute HR 105     2 Minute Post HR 72     Interval Heart Rate? Yes           Interval Oxygen   Interval Oxygen? Yes     Baseline Oxygen Saturation % 99 %     1 Minute Oxygen Saturation % 94 %     1 Minute Liters of Oxygen 4 L     2 Minute Oxygen Saturation % 99 %     2 Minute Liters of Oxygen 4 L     3 Minute Oxygen Saturation % 100 %     3 Minute Liters of Oxygen 4 L     4 Minute Oxygen Saturation % 98 %     4 Minute Liters of Oxygen 4 L     5 Minute Oxygen Saturation % 97 %     5 Minute Liters of Oxygen 4 L     6 Minute Oxygen Saturation % 97 %     6 Minute Liters of Oxygen 4 L     2 Minute Post Oxygen Saturation % 99 %     2 Minute Post Liters of Oxygen 4 L            Oxygen Initial Assessment:  Oxygen Initial Assessment - 02/18/21 0945      Home Oxygen   Home Oxygen Device Portable Concentrator;Home Concentrator    Sleep  Oxygen Prescription CPAP    Home Exercise Oxygen Prescription Continuous    Liters per minute 4    Home Resting Oxygen Prescription Continuous    Liters per minute 4    Compliance with Home Oxygen Use No    Comments --   Does not wear oxygen at rest like she is prescribed     Initial 6 min Walk   Oxygen Used Continuous    Liters per minute 4      Program Oxygen Prescription   Program Oxygen Prescription Continuous    Liters per minute 4      Intervention   Short Term Goals To learn and understand importance of monitoring SPO2 with pulse oximeter and demonstrate accurate use of the pulse oximeter.;To learn and understand importance of maintaining oxygen saturations>88%;To learn and demonstrate proper pursed lip  breathing techniques or other breathing techniques.;To learn and demonstrate proper use of respiratory medications;To learn and exhibit compliance with exercise, home and travel O2 prescription    Long  Term Goals Exhibits compliance with exercise, home and travel O2 prescription;Verbalizes importance of monitoring SPO2 with pulse oximeter and return demonstration;Maintenance of O2 saturations>88%;Exhibits proper breathing techniques, such as pursed lip breathing or other method taught during program session;Compliance with respiratory medication;Demonstrates proper use of MDI's           Oxygen Re-Evaluation:  Oxygen Re-Evaluation    Row Name 03/15/21 0836 04/08/21 1435           Program Oxygen Prescription   Program Oxygen Prescription Continuous Continuous      Liters per minute 4 4             Home Oxygen   Home Oxygen Device Portable Concentrator;Home Concentrator Portable Concentrator;Home Concentrator      Sleep Oxygen Prescription CPAP CPAP      Home Exercise Oxygen Prescription Continuous Continuous      Liters per minute 4 4      Home Resting Oxygen Prescription Continuous Continuous      Liters per minute 4 4      Compliance with Home Oxygen Use -- Yes              Goals/Expected Outcomes   Short Term Goals To learn and understand importance of monitoring SPO2 with pulse oximeter and demonstrate accurate use of the pulse oximeter.;To learn and understand importance of maintaining oxygen saturations>88%;To learn and demonstrate proper pursed lip breathing techniques or other breathing techniques.;To learn and demonstrate proper use of respiratory medications;To learn and exhibit compliance with exercise, home and travel O2 prescription To learn and understand importance of monitoring SPO2 with pulse oximeter and demonstrate accurate use of the pulse oximeter.;To learn and understand importance of maintaining oxygen saturations>88%;To learn and demonstrate proper pursed lip breathing techniques or other breathing techniques.;To learn and demonstrate proper use of respiratory medications;To learn and exhibit compliance with exercise, home and travel O2 prescription      Long  Term Goals Exhibits compliance with exercise, home and travel O2 prescription;Verbalizes importance of monitoring SPO2 with pulse oximeter and return demonstration;Maintenance of O2 saturations>88%;Exhibits proper breathing techniques, such as pursed lip breathing or other method taught during program session;Compliance with respiratory medication;Demonstrates proper use of MDI's Exhibits compliance with exercise, home and travel O2 prescription;Verbalizes importance of monitoring SPO2 with pulse oximeter and return demonstration;Maintenance of O2 saturations>88%;Exhibits proper breathing techniques, such as pursed lip breathing or other method taught during program session;Compliance with respiratory medication;Demonstrates proper use of MDI's      Goals/Expected Outcomes compliance and understanding of oxygen saturation and pursed lip breathing. compliance and understanding of oxygen saturation and pursed lip breathing.             Oxygen Discharge (Final Oxygen Re-Evaluation):   Oxygen Re-Evaluation - 04/08/21 1435      Program Oxygen Prescription   Program Oxygen Prescription Continuous    Liters per minute 4      Home Oxygen   Home Oxygen Device Portable Concentrator;Home Concentrator    Sleep Oxygen Prescription CPAP    Home Exercise Oxygen Prescription Continuous    Liters per minute 4    Home Resting Oxygen Prescription Continuous    Liters per minute 4    Compliance with Home Oxygen Use Yes      Goals/Expected Outcomes   Short Term Goals To learn and  understand importance of monitoring SPO2 with pulse oximeter and demonstrate accurate use of the pulse oximeter.;To learn and understand importance of maintaining oxygen saturations>88%;To learn and demonstrate proper pursed lip breathing techniques or other breathing techniques.;To learn and demonstrate proper use of respiratory medications;To learn and exhibit compliance with exercise, home and travel O2 prescription    Long  Term Goals Exhibits compliance with exercise, home and travel O2 prescription;Verbalizes importance of monitoring SPO2 with pulse oximeter and return demonstration;Maintenance of O2 saturations>88%;Exhibits proper breathing techniques, such as pursed lip breathing or other method taught during program session;Compliance with respiratory medication;Demonstrates proper use of MDI's    Goals/Expected Outcomes compliance and understanding of oxygen saturation and pursed lip breathing.           Initial Exercise Prescription:  Initial Exercise Prescription - 02/18/21 1200      Date of Initial Exercise RX and Referring Provider   Date 02/18/21    Referring Provider Dr. Vaughan Browner    Expected Discharge Date 04/21/21      Oxygen   Oxygen Continuous    Liters 4      Treadmill   MPH 2    Grade 0    Minutes 15      NuStep   Level 2    SPM 80    Minutes 15      Prescription Details   Frequency (times per week) 2    Duration Progress to 30 minutes of continuous aerobic without  signs/symptoms of physical distress      Intensity   THRR 40-80% of Max Heartrate 56-112    Ratings of Perceived Exertion 11-13    Perceived Dyspnea 0-4      Progression   Progression Continue to progress workloads to maintain intensity without signs/symptoms of physical distress.      Resistance Training   Training Prescription Yes    Weight orange bands    Reps 10-15           Perform Capillary Blood Glucose checks as needed.  Exercise Prescription Changes:  Exercise Prescription Changes    Row Name 03/08/21 1100 03/22/21 1200 04/05/21 1500 04/05/21 1600       Response to Exercise   Blood Pressure (Admit) 118/70 146/70 136/80 --    Blood Pressure (Exercise) 142/70 150/82 138/70 --    Blood Pressure (Exit) 108/68 128/70 132/80 --    Heart Rate (Admit) 84 bpm 89 bpm 78 bpm --    Heart Rate (Exercise) 104 bpm 110 bpm 102 bpm --    Heart Rate (Exit) 92 bpm 95 bpm 83 bpm --    Oxygen Saturation (Admit) 97 % 99 % 100 % --    Oxygen Saturation (Exercise) 96 % 96 % 96 % --    Oxygen Saturation (Exit) 97 % 98 % 96 % --    Rating of Perceived Exertion (Exercise) _0 --    Perceived Dyspnea (Exercise) 0 0 1 --    Duration Continue with 30 min of aerobic exercise without signs/symptoms of physical distress. Progress to 45 minutes of aerobic exercise without signs/symptoms of physical distress Continue with 30 min of aerobic exercise without signs/symptoms of physical distress. --    Intensity Other (comment)  40%-80% HR Max THRR unchanged THRR unchanged --         Progression   Progression Continue to progress workloads to maintain intensity without signs/symptoms of physical distress. Continue to progress workloads to maintain intensity without signs/symptoms of physical distress. Continue to  progress workloads to maintain intensity without signs/symptoms of physical distress. --         Horticulturist, commercial Prescription Yes Yes Yes --    Weight orange bands orange  bands orange bands --    Reps 10-15 10-15 10-15 --    Time 10 Minutes 10 Minutes 10 Minutes --         Oxygen   Oxygen Continuous Continuous Continuous --    Liters _0 --         Treadmill   MPH 2._1 --    Grade 0 0 0 --    Minutes _2 --         NuStep   Level _3 --    SPM 80 80 80 --    Minutes _4 --    METs 2.5 2.7 2.6 --         Home Exercise Plan   Plans to continue exercise at -- -- -- [employer] (comment)  [employer] and water aerobics    Frequency -- -- -- Add 3 additional days to program exercise sessions.    Initial Home Exercises Provided -- -- -- 04/05/21           Exercise Comments:  Exercise Comments    Row Name 02/24/21 1212 04/05/21 1609         Exercise Comments Patient completed first day of exercise and tolerated well with no complaints or concerns. She was able to do 15 minutes on the Nustep and 15 minutes on the treadmill with no rest breaks and no complaints of shortness of breath. She was also able to do the resistance bands and stretches with no mobility issues or limitations. Will continue to monitor. Completed home exercise prescription with pt. Rashan does exercise outside of rehab, she hikes, participates in line dancing, and water aerobics. Pt was encouraged to continue with exercise and to look into a gym membership to continue exercise after pulmonary rehab. Pt was receptive and was given ways to progress home exercise.             Exercise Goals and Review:  Exercise Goals    Row Name 02/18/21 1210             Exercise Goals   Increase Physical Activity Yes       Intervention Provide advice, education, support and counseling about physical activity/exercise needs.;Develop an individualized exercise prescription for aerobic and resistive training based on initial evaluation findings, risk stratification, comorbidities and participant's personal goals.       Expected Outcomes Short Term: Attend rehab  on a regular basis to increase amount of physical activity.;Long Term: Add in home exercise to make exercise part of routine and to increase amount of physical activity.;Long Term: Exercising regularly at least 3-5 days a week.       Increase Strength and Stamina Yes       Intervention Provide advice, education, support and counseling about physical activity/exercise needs.;Develop an individualized exercise prescription for aerobic and resistive training based on initial evaluation findings, risk stratification, comorbidities and participant's personal goals.       Expected Outcomes Short Term: Increase workloads from initial exercise prescription for resistance, speed, and METs.;Short Term: Perform resistance training exercises routinely during rehab and add in resistance training at home;Long Term: Improve cardiorespiratory fitness, muscular endurance and strength as measured by increased METs and functional capacity (6MWT)  Able to understand and use rate of perceived exertion (RPE) scale Yes       Intervention Provide education and explanation on how to use RPE scale       Expected Outcomes Short Term: Able to use RPE daily in rehab to express subjective intensity level;Long Term:  Able to use RPE to guide intensity level when exercising independently       Able to understand and use Dyspnea scale Yes       Intervention Provide education and explanation on how to use Dyspnea scale       Expected Outcomes Short Term: Able to use Dyspnea scale daily in rehab to express subjective sense of shortness of breath during exertion;Long Term: Able to use Dyspnea scale to guide intensity level when exercising independently       Knowledge and understanding of Target Heart Rate Range (THRR) Yes       Intervention Provide education and explanation of THRR including how the numbers were predicted and where they are located for reference       Expected Outcomes Short Term: Able to state/look up THRR;Long  Term: Able to use THRR to govern intensity when exercising independently;Short Term: Able to use daily as guideline for intensity in rehab       Understanding of Exercise Prescription Yes       Intervention Provide education, explanation, and written materials on patient's individual exercise prescription       Expected Outcomes Short Term: Able to explain program exercise prescription;Long Term: Able to explain home exercise prescription to exercise independently              Exercise Goals Re-Evaluation :  Exercise Goals Re-Evaluation    Row Name 03/15/21 4917 04/08/21 1419           Exercise Goal Re-Evaluation   Exercise Goals Review Increase Physical Activity;Increase Strength and Stamina;Able to understand and use rate of perceived exertion (RPE) scale;Able to understand and use Dyspnea scale;Knowledge and understanding of Target Heart Rate Range (THRR);Understanding of Exercise Prescription Increase Physical Activity;Increase Strength and Stamina;Able to understand and use rate of perceived exertion (RPE) scale;Able to understand and use Dyspnea scale;Knowledge and understanding of Target Heart Rate Range (THRR);Understanding of Exercise Prescription      Comments Pt has completed 4 exercise sessions and has made some progression with workload increases in the short time she has been here. She has tolerated exercise well with no complaints and is becoming independent with equipment and resistance band exercises. She is exercising at 2.7 METS on the Nustep on level 3. Will continue to monitor and progress as she is able. Janeice has completed 11 exercise sessions and has been consistent with workload and MET increases. She has become independent with setting up equipment and with resistance band exercises and stretches. She is currently exercising at 2.6 METS on the Nustep and 3.3 METS on the treadmill. Will continue to monitor and progress as she is able.      Expected Outcomes Through  exercise at rehab and home the patient will decrease shortness of breath with daily activities and feel confident in carrying out an exercise regimn at home. For patient to continue home exercise and to have a plan set in place for exercise once she completes pulmonary rehab, which is 04/21/21.             Discharge Exercise Prescription (Final Exercise Prescription Changes):  Exercise Prescription Changes - 04/05/21 1600      Home  Exercise Plan   Plans to continue exercise at [facility] (comment)   [employer] Dancing and water aerobics   Frequency Add 3 additional days to program exercise sessions.    Initial Home Exercises Provided 04/05/21           Nutrition:  Target Goals: Understanding of nutrition guidelines, daily intake of sodium <151m, cholesterol <2044m calories 30% from fat and 7% or less from saturated fats, daily to have 5 or more servings of fruits and vegetables.  Biometrics:  Pre Biometrics - 02/18/21 1211      Pre Biometrics   Grip Strength 24 kg            Nutrition Therapy Plan and Nutrition Goals:  Nutrition Therapy & Goals - 03/01/21 1202      Nutrition Therapy   Diet TLC    Drug/Food Interactions Statins/Certain Fruits      Personal Nutrition Goals   Nutrition Goal Pt to build a healthy plate including vegetables, fruits, whole grains, and low-fat dairy products in a heart healthy meal plan.    Personal Goal #2 Pt to choose healthy snacks to counteract nausea    Personal Goal #3 Try [employer]pe[employer] snacks      Intervention Plan   Intervention Prescribe, educate and counsel regarding individualized specific dietary modifications aiming towards targeted core components such as weight, hypertension, lipid management, diabetes, heart failure and other comorbidities.;Nutrition handout(s) given to patient.    Expected Outcomes Short Term Goal: Understand basic principles of dietary content, such as calories, fat, sodium, cholesterol and nutrients.            Nutrition Assessments:  MEDIFICTS Score Key:  ?70 Need to make dietary changes   40-70 Heart Healthy Diet  ? 40 Therapeutic Level Cholesterol Diet  Flowsheet Row PULMONARY REHAB OTHER RESPIRAT[facility] [place] [facility] [place] 59     Picture Your Plate Scores:  <4<45nhealthy dietary pattern with much room for improvement.  41-50 Dietary pattern unlikely to meet recommendations for good health and room for improvement.  51-60 More healthful dietary pattern, with some room for improvement.   >60 Healthy dietary pattern, although there may be some specific behaviors that could be improved.    Nutrition Goals Re-Evaluation:  Nutrition Goals Re-Evaluation    Ro[place] 03/01/21 1203 04/08/21 0820           Goals   Current Weight 168 lb (76.2 kg) 165 lb 12.6 oz (75.2 kg)      Nutrition Goal Pt to build a healthy plate including vegetables, fruits, whole grains, and low-fat dairy products in a heart healthy meal plan. Pt to build a healthy plate including vegetables, fruits, whole grains, and low-fat dairy products in a heart healthy meal plan.             Personal Goal #2 Re-Evaluation   Personal Goal #2 Pt to choose healthy snacks to counteract nausea Pt to choose healthy snacks to counteract nausea             Personal Goal #3 Re-Evaluation   Personal Goal #3 Try Hippeas snacks Try Hippeas snacks             Nutrition Goals Discharge (Final Nutrition Goals Re-Evaluation):  Nutrition Goals Re-Evaluation - 04/08/21 0820      Goals   Current Weight 165 lb 12.6 oz (75.2 kg)    Nutrition Goal Pt to build a healthy plate  including vegetables, fruits, whole grains, and low-fat dairy products in a heart healthy meal plan.      Personal Goal #2 Re-Evaluation   Personal Goal #2 Pt to choose healthy snacks to counteract nausea      Personal Goal #3 Re-Evaluation   Personal Goal #3 Try Hippeas  snacks           Psychosocial: Target Goals: Acknowledge presence or absence of significant depression and/or stress, maximize coping skills, provide positive support system. Participant is able to verbalize types and ability to use techniques and skills needed for reducing stress and depression.  Initial Review & Psychosocial Screening:  Initial Psych Review & Screening - 02/18/21 0948      Initial Review   Current issues with None Identified      Family Dynamics   Good Support System? Yes   Friends and Bible study     Barriers   Psychosocial barriers to participate in program The patient should benefit from training in stress management and relaxation.      Screening Interventions   Interventions Encouraged to exercise           Quality of Life Scores:  Scores of 19 and below usually indicate a poorer quality of life in these areas.  A difference of  2-3 points is a clinically meaningful difference.  A difference of 2-3 points in the total score of the Quality of Life Index has been associated with significant improvement in overall quality of life, self-image, physical symptoms, and general health in studies assessing change in quality of life.  PHQ-9: Recent Review Flowsheet Data    Depression screen [facility] 2/9 02/18/2021 08/25/2020 08/05/2020 01/23/2020 08/08/2019   Decreased Interest 0 0 0 0 0   Down, Depressed, Hopeless 0 0 0 1 0   PHQ - 2 Score 0 0 0 1 0   Altered sleeping 1 - - - -   Tired, decreased energy 0 - - - -   Change in appetite 0 - - - -   Feeling bad or failure about yourself  0 - - - -   Trouble concentrating 0 - - - -   Moving slowly or fidgety/restless 0 - - - -   Suicidal thoughts 0 - - - -   Difficult doing work/chores Not difficult at all - - - -     Interpretation of Total Score  Total Score Depression Severity:  1-4 = Minimal depression, 5-9 = Mild depression, 10-14 = Moderate depression, 15-19 = Moderately severe depression, 20-27 = Severe  depression   Psychosocial Evaluation and Intervention:  Psychosocial Evaluation - 02/18/21 1212      Psychosocial Evaluation & Interventions   Interventions Encouraged to exercise with the program and follow exercise prescription    Continue Psychosocial Services  No Follow up required           Psychosocial Re-Evaluation:  Psychosocial Re-Evaluation    Row Name 03/14/21 1224 04/05/21 0915           Psychosocial Re-Evaluation   Current issues with None Identified None Identified      Comments No psychosocial concerns identified at this time. No psychosocial concerns identified at this time.      Expected Outcomes For Donnaleato continue to be free of psychosocial concerns while participating in pulmonary rehab. For [place] to continue to be free of psychosocial concerns while participating in pulmonary rehab.      Interventions Encouraged to attend Pulmonary Rehabilitation for the exercise  Encouraged to attend Pulmonary Rehabilitation for the exercise      Continue Psychosocial Services  No Follow up required No Follow up required             Psychosocial Discharge (Final Psychosocial Re-Evaluation):  Psychosocial Re-Evaluation - 04/05/21 0915      Psychosocial Re-Evaluation   Current issues with None Identified    Comments No psychosocial concerns identified at this time.    Expected Outcomes For Cristie to continue to be free of psychosocial concerns while participating in pulmonary rehab.    Interventions Encouraged to attend Pulmonary Rehabilitation for the exercise    Continue Psychosocial Services  No Follow up required           Education: Education Goals: Education classes will be provided on a weekly basis, covering required topics. Participant will state understanding/return demonstration of topics presented.  Learning Barriers/Preferences:  Learning Barriers/Preferences - 02/18/21 0949      Learning Barriers/Preferences   Learning Barriers None     Learning Preferences Written Material;Skilled Demonstration;Individual Instruction           Education Topics: Risk Factor Reduction:  -Group instruction that is supported by a PowerPoint presentation. Instructor discusses the definition of a risk factor, different risk factors for pulmonary disease, and how the heart and lungs work together.     Nutrition for Pulmonary Patient:  -Group instruction provided by PowerPoint slides, verbal discussion, and written materials to support subject matter. The instructor gives an explanation and review of healthy diet recommendations, which includes a discussion on weight management, recommendations for fruit and vegetable consumption, as well as protein, fluid, caffeine, fiber, sodium, sugar, and alcohol. Tips for eating when patients are short of breath are discussed.   Pursed Lip Breathing:  -Group instruction that is supported by demonstration and informational handouts. Instructor discusses the benefits of pursed lip and diaphragmatic breathing and detailed demonstration on how to preform both.   Flowsheet Row PULMONARY REHAB OTHER RESPIRATORY from 03/24/2021 in [place]  Date 03/24/21  Educator Jess-Handout  Instruction Review Code 1- Verbalizes Understanding      Oxygen Safety:  -Group instruction provided by PowerPoint, verbal discussion, and written material to support subject matter. There is an overview of "What is Oxygen" and "Why do we need it".  Instructor also reviews how to create a safe environment for oxygen use, the importance of using oxygen as prescribed, and the risks of noncompliance. There is a brief discussion on traveling with oxygen and resources the patient may utilize.   Oxygen Equipment:  -Group instruction provided by [facility] Staff utilizing handouts, written materials, and equipment demonstrations.   Signs and Symptoms:  -Group instruction provided by written material and  verbal discussion to support subject matter. Warning signs and symptoms of infection, stroke, and heart attack are reviewed and when to call the physician/911 reinforced. Tips for preventing the spread of infection discussed.   Advanced Directives:  -Group instruction provided by verbal instruction and written material to support subject matter. Instructor reviews Advanced Directive laws and proper instruction for filling out document.   Pulmonary Video:  -Group video education that reviews the importance of medication and oxygen compliance, exercise, good nutrition, pulmonary hygiene, and pursed lip and diaphragmatic breathing for the pulmonary patient.   Exercise for the Pulmonary Patient:  -Group instruction that is supported by a PowerPoint presentation. Instructor discusses benefits of exercise, core components of exercise, frequency, duration, and intensity of an exercise routine, importance  of utilizing pulse oximetry during exercise, safety while exercising, and options of places to exercise outside of rehab.     Pulmonary Medications:  -Verbally interactive group education provided by instructor with focus on inhaled medications and proper administration.   Anatomy and Physiology of the Respiratory System and Intimacy:  -Group instruction provided by PowerPoint, verbal discussion, and written material to support subject matter. Instructor reviews respiratory cycle and anatomical components of the respiratory system and their functions. Instructor also reviews differences in obstructive and restrictive respiratory diseases with examples of each. Intimacy, Sex, and Sexuality differences are reviewed with a discussion on how relationships can change when diagnosed with pulmonary disease. Common sexual concerns are reviewed. Flowsheet Row PULMONARY REHAB OTHER RESPIRATORY from 03/24/2021 in [place]  Date 02/24/21  Educator handout      MD DAY -A group  question and answer session with a medical doctor that allows participants to ask questions that relate to their pulmonary disease state.   OTHER EDUCATION -Group or individual verbal, written, or video instructions that support the educational goals of the pulmonary rehab program. [place] from 03/24/2021 in [place]  Date 03/17/21  [facility] to [place] a Sedentary Lifestyle]  Educator handout      Holiday Eating Survival Tips:  -Group instruction provided by PowerPoint slides, verbal discussion, and written materials to support subject matter. The instructor gives patients tips, tricks, and techniques to help them not only survive but enjoy the holidays despite the onslaught of food that accompanies the holidays.   Knowledge Questionnaire Score:   Core Components/Risk Factors/Patient Goals at Admission:  Personal Goals and Risk Factors at Admission - 02/18/21 1212      Core Components/Risk Factors/Patient Goals on Admission    Weight Management Yes;Weight Loss    Expected Outcomes Weight Loss: Understanding of general recommendations for a balanced deficit meal plan, which promotes 1-2 lb weight loss per week and includes a negative energy balance of [phone] kcal/d;Long Term: Adherence to nutrition and physical activity/exercise program aimed toward attainment of established weight goal    Improve shortness of breath with ADL's Yes    Intervention Provide education, individualized exercise plan and daily activity instruction to help decrease symptoms of SOB with activities of daily living.    Expected Outcomes Short Term: Improve cardiorespiratory fitness to achieve a reduction of symptoms when performing ADLs;Long Term: Be able to perform more ADLs without symptoms or delay the onset of symptoms           Core Components/Risk Factors/Patient Goals Review:   Goals and Risk Factor Review    Row Name 03/14/21 1225  04/05/21 0916           Core Components/Risk Factors/Patient Goals Review   Personal Goals Review Develop more efficient breathing techniques such as purse lipped breathing and diaphragmatic breathing and practicing self-pacing with activity.;Increase knowledge of respiratory medications and ability to use respiratory devices properly.;Improve shortness of breath with ADL's Develop more efficient breathing techniques such as purse lipped breathing and diaphragmatic breathing and practicing self-pacing with activity.;Increase knowledge of respiratory medications and ability to use respiratory devices properly.;Improve shortness of breath with ADL's      Review Kelisha has been using supplemental oxygen @ 4L/min when exercising since January 2022 which has been a challenge, but she is adjusting.  She is very active in addition to exercising in pulmonary rehab.  She line dances with a group, and  [place].  She is exercising @ 2.7 mets on the nustep and walking 3 mph on the treadmill. Jaclyne has improved her deconditioning, but is very active outside of class.  She hikes and line dances regularly.  She is learning how to exercise safely given her IPF.      Expected Outcomes See admission goals. See admission goals.             Core Components/Risk Factors/Patient Goals at Discharge (Final Review):   Goals and Risk Factor Review - 04/05/21 0916      Core Components/Risk Factors/Patient Goals Review   Personal Goals Review Develop more efficient breathing techniques such as purse lipped breathing and diaphragmatic breathing and practicing self-pacing with activity.;Increase knowledge of respiratory medications and ability to use respiratory devices properly.;Improve shortness of breath with ADL's    Review Syriah has improved her deconditioning, but is very active outside of class.  She hikes and line dances regularly.  She is learning how to exercise safely  given her IPF.    Expected Outcomes See admission goals.           ITP Comments:   Comments: ITP REVIEW Pt is making expected progress toward pulmonary rehab goals after completing 12 sessions. Recommend continued exercise, life style modification, education, and utilization of breathing techniques to increase stamina and strength and decrease shortness of breath with exertion.

## 2021-04-12 NOTE — Progress Notes (Signed)
Daily Session Note  Patient Details  Name: Maria [name] MRN: [id number] Date of Birth: 12-Jul-1941 Referring Provider:   April Manson Pulmonary Rehab Walk Test from 02/18/2021 in [place]  Referring Provider Dr. Vaughan Browner      Encounter Date: 04/12/2021  Check In:  Session Check In - 04/12/21 1138      Check-In   Supervising physician immediately available to respond to emergencies [facility] immediately available    Physician(s) Dr. Tawanna Solo    Location [facility]    Staff Present Rosebud Poles, RN, BSN;Lisa Ysidro Evert, RN;Itzia Cunliffe Hassell Done, MS, ACSM-CEP, Exercise Physiologist    Virtual Visit No    Medication changes reported     No    Fall or balance concerns reported    No    Tobacco Cessation No Change    Warm-up and Cool-down Performed on first and last piece of equipment    Resistance Training Performed Yes    VAD Patient? No    PAD/SET Patient? No      Pain Assessment   Currently in Pain? No/denies    Multiple Pain Sites No           Capillary Blood Glucose: No results found for this or any previous visit (from the past 24 hour(s)).    Social History   Tobacco Use  Smoking Status Former Smoker  . Packs/day: 1.00  . Years: 10.00  . Pack years: 10.00  . Types: Cigarettes  . Quit date: 12/18/1969  . Years since quitting: 51.3  Smokeless Tobacco Never Used    Goals Met:  Proper associated with RPD/PD & O2 Sat Independence with exercise equipment Exercise tolerated well No report of cardiac concerns or symptoms Strength training completed today  Goals Unmet:  Not Applicable  Comments: Service time is from 1058 to 1149    Dr. Fransico Him is Medical Director for Cardiac Rehab at [facility].

## 2021-04-14 ENCOUNTER — Encounter (HOSPITAL_COMMUNITY)
Admission: RE | Admit: 2021-04-14 | Discharge: 2021-04-14 | Disposition: A | Discharge status: Home / Self Care | Payer: Medicare HMO | Source: Ambulatory Visit | Attending: Pulmonary Disease | Admitting: Pulmonary Disease

## 2021-04-14 ENCOUNTER — Other Ambulatory Visit: Payer: Self-pay

## 2021-04-14 ENCOUNTER — Other Ambulatory Visit (HOSPITAL_COMMUNITY): Payer: Self-pay

## 2021-04-14 DIAGNOSIS — J84112 Idiopathic pulmonary fibrosis: Secondary | ICD-10-CM

## 2021-04-14 NOTE — Progress Notes (Signed)
Daily Session Note  Patient Details  Name: Maria [name] MRN: [id number] Date of Birth: [birth year] Referring Provider:   April Manson Pulmonary Rehab Walk Test from 02/18/2021 in [place]  Referring Provider Dr. Vaughan Browner      Encounter Date: 04/14/2021  Check In:  Session Check In - 04/14/21 1158      Check-In   Supervising physician immediately available to respond to emergencies [facility] immediately available    Physician(s) Dr. Kurtis Bushman    [facility]    Staff Present Rosebud Poles, RN, BSN;Jennfer Gassen Ysidro Evert, RN;David [place], MS, ACSM-CEP, CCRP, Exercise Physiologist    Virtual Visit No    Medication changes reported     No    Fall or balance concerns reported    No    Tobacco Cessation No Change    Warm-up and Cool-down Performed on first and last piece of equipment    Resistance Training Performed Yes    VAD Patient? No    PAD/SET Patient? No      Pain Assessment   Currently in Pain? No/denies    Multiple Pain Sites No           Capillary Blood Glucose: No results found for this or any previous visit (from the past 24 hour(s)).    Social History   Tobacco Use  Smoking Status Former Smoker  . Packs/day: 1.00  . Years: 10.00  . Pack years: 10.00  . Types: Cigarettes  . Quit date: 12/18/1969  . Years since quitting: 51.3  Smokeless Tobacco Never Used    Goals Met:  Exercise tolerated well No report of cardiac concerns or symptoms Strength training completed today  Goals Unmet:  Not Applicable  Comments: Service time is from 1050 to 1145    Dr. Fransico Him is Medical Director for Cardiac Rehab at [facility].

## 2021-04-19 ENCOUNTER — Other Ambulatory Visit: Payer: Self-pay

## 2021-04-19 ENCOUNTER — Encounter (HOSPITAL_COMMUNITY)
Admission: RE | Admit: 2021-04-19 | Discharge: 2021-04-19 | Disposition: A | Discharge status: Home / Self Care | Payer: Medicare HMO | Source: Ambulatory Visit | Attending: Pulmonary Disease | Admitting: Pulmonary Disease

## 2021-04-19 VITALS — Wt 166.4 lb

## 2021-04-19 DIAGNOSIS — J84112 Idiopathic pulmonary fibrosis: Secondary | ICD-10-CM | POA: Insufficient documentation

## 2021-04-19 NOTE — Progress Notes (Signed)
Daily Session Note  Patient Details  Name: Maria [name] MRN: [id number] Date of Birth: Feb 25, 1941 Referring Provider:   April Manson Pulmonary Rehab Walk Test from 02/18/2021 in [place]  Referring Provider Dr. Vaughan Browner      Encounter Date: 04/19/2021  Check In:  Session Check In - 04/19/21 1212      Check-In   Supervising physician immediately available to respond to emergencies [facility] immediately available    Physician(s) Dr. Kurtis Bushman    [facility]    Staff Present Rosebud Poles, RN, BSN;Ramon Dredge, RN, MHA;Carlette Wilber Ol[place]ant, RN, BSN;Leolia Vinzant Ysidro Evert, RN;David [place], MS, ACSM-CEP, CCRP, Exercise Physiologist;Meredith Rosana Hoes RD, Eugenie Birks, MS, ACSM-CEP, Exercise Physiologist    Virtual Visit No    Medication changes reported     No    Fall or balance concerns reported    No    Tobacco Cessation No Change    Warm-up and Cool-down Performed on first and last piece of equipment    Resistance Training Performed Yes    VAD Patient? No    PAD/SET Patient? No      Pain Assessment   Currently in Pain? No/denies           Capillary Blood Glucose: No results found for this or any previous visit (from the past 24 hour(s)).   Exercise Prescription Changes - 04/19/21 1400      Response to Exercise   Blood Pressure (Admit) 122/70    Blood Pressure (Exercise) 150/80    Blood Pressure (Exit) 122/72    Heart Rate (Admit) 64 bpm    Heart Rate (Exercise) 100 bpm    Heart Rate (Exit) 88 bpm    Oxygen Saturation (Admit) 99 %    Oxygen Saturation (Exercise) 97 %    Oxygen Saturation (Exit) 99 %    Rating of Perceived Exertion (Exercise) 8    Perceived Dyspnea (Exercise) 0    Duration Continue with 30 min of aerobic exercise without signs/symptoms of physical distress.    Intensity THRR unchanged      Progression   Progression Continue to progress workloads to maintain intensity without signs/symptoms  of physical distress.      Resistance Training   Training Prescription Yes    Weight orange bands    Reps 10-15    Time 10 Minutes      Oxygen   Oxygen Continuous    Liters 4      NuStep   Level 1    SPM 80    Minutes 15    METs 2.8      Arm/Foot Ergometer   Level 1    Minutes 15    METs 55   watts          Social History   Tobacco Use  Smoking Status Former Smoker  . Packs/day: 1.00  . Years: 10.00  . Pack years: 10.00  . Types: Cigarettes  . Quit date: 12/18/1969  . Years since quitting: 51.3  Smokeless Tobacco Never Used    Goals Met:  Exercise tolerated well No report of cardiac concerns or symptoms Strength training completed today  Goals Unmet:  Not Applicable    Comments: Service time is from 1049 to 1140    Dr. Fransico Him is Medical Director for Cardiac Rehab at [facility].

## 2021-04-21 ENCOUNTER — Encounter (HOSPITAL_COMMUNITY)
Admission: RE | Admit: 2021-04-21 | Discharge: 2021-04-21 | Disposition: A | Discharge status: Home / Self Care | Payer: Medicare HMO | Source: Ambulatory Visit | Attending: Pulmonary Disease | Admitting: Pulmonary Disease

## 2021-04-21 ENCOUNTER — Other Ambulatory Visit: Payer: Self-pay

## 2021-04-21 DIAGNOSIS — J84112 Idiopathic pulmonary fibrosis: Secondary | ICD-10-CM

## 2021-04-21 NOTE — Progress Notes (Signed)
Daily Session Note  Patient Details  Name: RONELL BOLDIN MRN: [id number] Date of Birth: 1941/03/28 Referring Provider:   April Manson Pulmonary Rehab Walk Test from 02/18/2021 in [place]  Referring Provider Dr. Vaughan Browner      Encounter Date: 04/21/2021  Check In:  Session Check In - 04/21/21 1151      Check-In   Supervising physician immediately available to respond to emergencies [facility] immediately available    Physician(s) Dr. Arbutus Ped    [facility]    Staff Present Rosebud Poles, RN, Isaac Laud, MS, ACSM-CEP, Exercise Physiologist;Lisa Ysidro Evert, RN;David Makemson, MS, ACSM-CEP, CCRP, Exercise Physiologist    Virtual Visit No    Medication changes reported     No    Fall or balance concerns reported    No    Tobacco Cessation No Change    Warm-up and Cool-down Performed as group-led instruction    Resistance Training Performed No    VAD Patient? No    PAD/SET Patient? No      Pain Assessment   Currently in Pain? No/denies    Multiple Pain Sites No           Capillary Blood Glucose: No results found for this or any previous visit (from the past 24 hour(s)).    Social History   Tobacco Use  Smoking Status Former Smoker  . Packs/day: 1.00  . Years: 10.00  . Pack years: 10.00  . Types: Cigarettes  . Quit date: 12/18/1969  . Years since quitting: 51.3  Smokeless Tobacco Never Used    Goals Met:  Proper associated with RPD/PD & O2 Sat Exercise tolerated well No report of cardiac concerns or symptoms Completed pulmonary rehab program today  Goals Unmet:  Not Applicable  Comments: Service time is from 1030 to 1110    Dr. Fransico Him is Medical Director for Cardiac Rehab at [facility].

## 2021-04-26 DIAGNOSIS — G4733 Obstructive sleep apnea (adult) (pediatric): Secondary | ICD-10-CM | POA: Diagnosis not present

## 2021-04-26 DIAGNOSIS — J849 Interstitial pulmonary disease, unspecified: Secondary | ICD-10-CM | POA: Diagnosis not present

## 2021-05-05 NOTE — Progress Notes (Signed)
Discharge Progress Report  Patient Details  Name: Maria [name] MRN: [id number] Date of Birth: 03-20-1941 Referring Provider:   [facility] Walk Test from 02/18/2021 in [place]  Referring Provider Dr. Vaughan Browner       Number of Visits: 16  Reason for Discharge:  Patient reached a stable level of exercise. Patient independent in their exercise. Patient has met program and personal goals.  Smoking History:  Social History   Tobacco Use  Smoking Status Former Smoker  . Packs/day: 1.00  . Years: 10.00  . Pack years: 10.00  . Types: Cigarettes  . Quit date: 12/18/1969  . Years since quitting: 51.4  Smokeless Tobacco Never Used    Diagnosis:  IPF (idiopathic pulmonary fibrosis) ([place])  ADL [facility]:  Pulmonary Assessment Scores    Row Name 02/18/21 1350 04/21/21 1531       ADL [facility]SD   ADL Phase Entry Exit    SOB Score total 72 46         CAT Score   CAT Score 22 21         mMRC Score   mMRC Score 4 4           Initial Exercise Prescription:  Initial Exercise Prescription - 02/18/21 1200      Date of Initial Exercise RX and Referring Provider   Date 02/18/21    Referring Provider Dr. Vaughan Browner    Expected Discharge Date 04/21/21      Oxygen   Oxygen Continuous    Liters 4      Treadmill   MPH 2    Grade 0    Minutes 15      NuStep   Level 2    SPM 80    Minutes 15      Prescription Details   Frequency (times per week) 2    Duration Progress to 30 minutes of continuous aerobic without signs/symptoms of physical distress      Intensity   THRR 40-80% of Max Heartrate 56-112    Ratings of Perceived Exertion 11-13    Perceived Dyspnea 0-4      Progression   Progression Continue to progress workloads to maintain intensity without signs/symptoms of physical distress.      Resistance Training   Training Prescription Yes    Weight orange bands    Reps 10-15           Discharge Exercise  Prescription (Final Exercise Prescription Changes):  Exercise Prescription Changes - 04/19/21 1400      Response to Exercise   Blood Pressure (Admit) 122/70    Blood Pressure (Exercise) 150/80    Blood Pressure (Exit) 122/72    Heart Rate (Admit) 64 bpm    Heart Rate (Exercise) 100 bpm    Heart Rate (Exit) 88 bpm    Oxygen Saturation (Admit) 99 %    Oxygen Saturation (Exercise) 97 %    Oxygen Saturation (Exit) 99 %    Rating of Perceived Exertion (Exercise) 8    Perceived Dyspnea (Exercise) 0    Duration Continue with 30 min of aerobic exercise without signs/symptoms of physical distress.    Intensity THRR unchanged      Progression   Progression Continue to progress workloads to maintain intensity without signs/symptoms of physical distress.      Resistance Training   Training Prescription Yes    Weight orange bands    Reps 10-15    Time 10 Minutes  Oxygen   Oxygen Continuous    Liters 4      NuStep   Level 1    SPM 80    Minutes 15    METs 2.8      Arm/Foot Ergometer   Level 1    Minutes 15    METs 55   watts          Functional Capacity:  6 Minute Walk    Row Name 02/18/21 1217 04/21/21 1523       6 Minute Walk   Phase Initial Discharge    Distance 1348 feet 1400 feet    Distance % Change -- 3.86 %    Distance Feet Change -- 52 ft    Walk Time 6 minutes 6 minutes    # of Rest Breaks 0 0    MPH 2.55 2.65    METS 2.39 2.53    RPE 8 8    Perceived Dyspnea  1 1    VO2 Peak 8.35 8.86    Symptoms No No    Resting HR 59 bpm 61 bpm    Resting BP 128/64 124/68    Resting Oxygen Saturation  99 % 98 %    Exercise Oxygen Saturation  during 6 min walk 94 % 92 %    Max Ex. HR 105 bpm 123 bpm    Max Ex. BP 152/78 134/64    2 Minute Post BP 122/74 116/64         Interval HR   1 Minute HR 96 95    2 Minute HR 98 108    3 Minute HR 100 114    4 Minute HR 99 111    5 Minute HR 105 109    6 Minute HR 105 123    2 Minute Post HR 72 70    Interval  Heart Rate? Yes Yes         Interval Oxygen   Interval Oxygen? Yes --    Baseline Oxygen Saturation % 99 % 98 %    1 Minute Oxygen Saturation % 94 % 98 %    1 Minute Liters of Oxygen 4 L 4 L    2 Minute Oxygen Saturation % 99 % 97 %    2 Minute Liters of Oxygen 4 L 4 L    3 Minute Oxygen Saturation % 100 % 95 %    3 Minute Liters of Oxygen 4 L 4 L    4 Minute Oxygen Saturation % 98 % 92 %    4 Minute Liters of Oxygen 4 L 4 L    5 Minute Oxygen Saturation % 97 % 95 %    5 Minute Liters of Oxygen 4 L 4 L    6 Minute Oxygen Saturation % 97 % 93 %    6 Minute Liters of Oxygen 4 L 4 L    2 Minute Post Oxygen Saturation % 99 % 100 %    2 Minute Post Liters of Oxygen 4 L 4 L           Psychological, QOL, Others - Outcomes: PHQ 2/9: Depression screen [facility] 2/9 04/21/2021 02/18/2021 08/25/2020 08/05/2020 01/23/2020  Decreased Interest 0 0 0 0 0  Down, Depressed, Hopeless 0 0 0 0 1  PHQ - 2 Score 0 0 0 0 1  Altered sleeping 0 1 - - -  Tired, decreased energy 0 0 - - -  Change in appetite 0 0 - - -  Feeling bad or failure about yourself  0 0 - - -  Trouble concentrating 0 0 - - -  Moving slowly or fidgety/restless 0 0 - - -  Suicidal thoughts 0 0 - - -  PHQ-9 Score 0 - - - -  Difficult doing work/chores Not difficult at all Not difficult at all - - -  Some recent data might be hidden    Quality of Life:   Personal Goals: Goals established at orientation with interventions provided to work toward goal.  Personal Goals and Risk Factors at Admission - 02/18/21 1212      Core Components/Risk Factors/Patient Goals on Admission    Weight Management Yes;Weight Loss    Expected Outcomes Weight Loss: Understanding of general recommendations for a balanced deficit meal plan, which promotes 1-2 lb weight loss per week and includes a negative energy balance of [phone] kcal/d;Long Term: Adherence to nutrition and physical activity/exercise program aimed toward attainment of established weight goal     Improve shortness of breath with ADL's Yes    Intervention Provide education, individualized exercise plan and daily activity instruction to help decrease symptoms of SOB with activities of daily living.    Expected Outcomes Short Term: Improve cardiorespiratory fitness to achieve a reduction of symptoms when performing ADLs;Long Term: Be able to perform more ADLs without symptoms or delay the onset of symptoms            Personal Goals Discharge:  Goals and Risk Factor Review    Row Name 03/14/21 1225 04/05/21 0916           Core Components/Risk Factors/Patient Goals Review   Personal Goals Review Develop more efficient breathing techniques such as purse lipped breathing and diaphragmatic breathing and practicing self-pacing with activity.;Increase knowledge of respiratory medications and ability to use respiratory devices properly.;Improve shortness of breath with ADL's Develop more efficient breathing techniques such as purse lipped breathing and diaphragmatic breathing and practicing self-pacing with activity.;Increase knowledge of respiratory medications and ability to use respiratory devices properly.;Improve shortness of breath with ADL's      Review Mariany has been using supplemental oxygen @ 4L/min when exercising since January 2022 which has been a challenge, but she is adjusting.  She is very active in addition to exercising in pulmonary rehab.  She line dances with a group, and [place].  She is exercising @ 2.7 mets on the nustep and walking 3 mph on the treadmill. Peytan has improved her deconditioning, but is very active outside of class.  She hikes and line dances regularly.  She is learning how to exercise safely given her IPF.      Expected Outcomes See admission goals. See admission goals.             Exercise Goals and Review:  Exercise Goals    Row Name 02/18/21 1210             Exercise Goals   Increase Physical  Activity Yes       Intervention Provide advice, education, support and counseling about physical activity/exercise needs.;Develop an individualized exercise prescription for aerobic and resistive training based on initial evaluation findings, risk stratification, comorbidities and participant's personal goals.       Expected Outcomes Short Term: Attend rehab on a regular basis to increase amount of physical activity.;Long Term: Add in home exercise to make exercise part of routine and to increase amount of physical activity.;Long Term: Exercising regularly at least 3-5  days a week.       Increase Strength and Stamina Yes       Intervention Provide advice, education, support and counseling about physical activity/exercise needs.;Develop an individualized exercise prescription for aerobic and resistive training based on initial evaluation findings, risk stratification, comorbidities and participant's personal goals.       Expected Outcomes Short Term: Increase workloads from initial exercise prescription for resistance, speed, and METs.;Short Term: Perform resistance training exercises routinely during rehab and add in resistance training at home;Long Term: Improve cardiorespiratory fitness, muscular endurance and strength as measured by increased METs and functional capacity (6MWT)       Able to understand and use rate of perceived exertion (RPE) scale Yes       Intervention Provide education and explanation on how to use RPE scale       Expected Outcomes Short Term: Able to use RPE daily in rehab to express subjective intensity level;Long Term:  Able to use RPE to guide intensity level when exercising independently       Able to understand and use Dyspnea scale Yes       Intervention Provide education and explanation on how to use Dyspnea scale       Expected Outcomes Short Term: Able to use Dyspnea scale daily in rehab to express subjective sense of shortness of breath during exertion;Long Term: Able to  use Dyspnea scale to guide intensity level when exercising independently       Knowledge and understanding of Target Heart Rate Range (THRR) Yes       Intervention Provide education and explanation of THRR including how the numbers were predicted and where they are located for reference       Expected Outcomes Short Term: Able to state/look up THRR;Long Term: Able to use THRR to govern intensity when exercising independently;Short Term: Able to use daily as guideline for intensity in rehab       Understanding of Exercise Prescription Yes       Intervention Provide education, explanation, and written materials on patient's individual exercise prescription       Expected Outcomes Short Term: Able to explain program exercise prescription;Long Term: Able to explain home exercise prescription to exercise independently              Exercise Goals Re-Evaluation:  Exercise Goals Re-Evaluation    Row Name 03/15/21 4081 04/08/21 1419           Exercise Goal Re-Evaluation   Exercise Goals Review Increase Physical Activity;Increase Strength and Stamina;Able to understand and use rate of perceived exertion (RPE) scale;Able to understand and use Dyspnea scale;Knowledge and understanding of Target Heart Rate Range (THRR);Understanding of Exercise Prescription Increase Physical Activity;Increase Strength and Stamina;Able to understand and use rate of perceived exertion (RPE) scale;Able to understand and use Dyspnea scale;Knowledge and understanding of Target Heart Rate Range (THRR);Understanding of Exercise Prescription      Comments Pt has completed 4 exercise sessions and has made some progression with workload increases in the short time she has been here. She has tolerated exercise well with no complaints and is becoming independent with equipment and resistance band exercises. She is exercising at 2.7 METS on the Nustep on level 3. Will continue to monitor and progress as she is able. Ashaunti has  completed 11 exercise sessions and has been consistent with workload and MET increases. She has become independent with setting up equipment and with resistance band exercises and stretches. She is currently exercising at 2.6  METS on the Nustep and 3.3 METS on the treadmill. Will continue to monitor and progress as she is able.      Expected Outcomes Through exercise at rehab and home the patient will decrease shortness of breath with daily activities and feel confident in carrying out an exercise regimn at home. For patient to continue home exercise and to have a plan set in place for exercise once she completes pulmonary rehab, which is 04/21/21.             Nutrition & Weight - Outcomes:  Pre Biometrics - 02/18/21 1211      Pre Biometrics   Grip Strength 24 kg           Post Biometrics - 04/21/21 1523       Post  Biometrics   Grip Strength 23 kg           Nutrition:  Nutrition Therapy & Goals - 03/01/21 1202      Nutrition Therapy   Diet TLC    Drug/Food Interactions Statins/Certain Fruits      Personal Nutrition Goals   Nutrition Goal Pt to build a healthy plate including vegetables, fruits, whole grains, and low-fat dairy products in a heart healthy meal plan.    Personal Goal #2 Pt to choose healthy snacks to counteract nausea    Personal Goal #3 Try Hippeas snacks      Intervention Plan   Intervention Prescribe, educate and counsel regarding individualized specific dietary modifications aiming towards targeted core components such as weight, hypertension, lipid management, diabetes, heart failure and other comorbidities.;Nutrition handout(s) given to patient.    Expected Outcomes Short Term Goal: Understand basic principles of dietary content, such as calories, fat, sodium, cholesterol and nutrients.           Nutrition Discharge:   Education Questionnaire Score:  Knowledge Questionnaire Score - 04/21/21 1527      Knowledge Questionnaire Score   Post Score  15/18           Goals reviewed with patient; copy given to patient.

## 2021-05-10 ENCOUNTER — Other Ambulatory Visit (HOSPITAL_COMMUNITY): Payer: Self-pay

## 2021-05-10 MED FILL — Pirfenidone Cap 267 MG: ORAL | 30 days supply | Qty: 270 | Fill #1 | Status: CN

## 2021-05-18 ENCOUNTER — Telehealth: Payer: Self-pay | Admitting: Pharmacy Technician

## 2021-05-18 NOTE — Telephone Encounter (Signed)
  Patient was re-approved  for [employer] for Pulmonary Fibrosis copay assistance. Patient was Approved and Awarded $9,000. Coverage dates are 06/13/21 through 06/12/22.  Copay card details are:  Pharmacy Card [phone] [id number]

## 2021-05-23 ENCOUNTER — Telehealth: Payer: Self-pay | Admitting: Pharmacist

## 2021-05-23 ENCOUNTER — Other Ambulatory Visit (HOSPITAL_COMMUNITY): Payer: Self-pay

## 2021-05-23 MED FILL — Pirfenidone Cap 267 MG: ORAL | 10 days supply | Qty: 90 | Fill #1 | Status: AC

## 2021-05-23 NOTE — Telephone Encounter (Signed)
Patient states she has 4-5 days supply of Esbriet remaining at home which would last through this Friday 05/27/21. Her next grant can be billed no sooner than 06/13/21.  Will fill 10 days worth of Esbriet capsules at [facility] (#90 caps).  She is amenable to paying $7.42 for 10 day supply.  Will provide sample of Esbriet tablets for 3 weeks supply.  Patient aware that sample is tablet not caps. She is okay with picking up Esbriet sample. Sample in pharmacy office - she is aware that she should tell front desk that she is picking up sample from pharmacy team.  Knox Saliva, PharmD, MPH Clinical Pharmacist (Rheumatology and Pulmonology)

## 2021-05-27 DIAGNOSIS — G4733 Obstructive sleep apnea (adult) (pediatric): Secondary | ICD-10-CM | POA: Diagnosis not present

## 2021-05-27 DIAGNOSIS — J849 Interstitial pulmonary disease, unspecified: Secondary | ICD-10-CM | POA: Diagnosis not present

## 2021-06-10 ENCOUNTER — Telehealth: Payer: Self-pay | Admitting: Pulmonary Disease

## 2021-06-10 ENCOUNTER — Other Ambulatory Visit (HOSPITAL_COMMUNITY): Payer: Self-pay

## 2021-06-10 MED FILL — Pirfenidone Cap 267 MG: ORAL | 10 days supply | Qty: 90 | Fill #2 | Status: CN

## 2021-06-10 NOTE — Telephone Encounter (Signed)
Pt is calling in regards to getting a refill for her Esbriet.  Pls regard; [phone]

## 2021-06-10 NOTE — Telephone Encounter (Signed)
Sample of Esbriet provided to patient today. Her grant is active next week and she does not have medication remaining at home (last dose is tonight).  [place]: [id number] Exp: 05/2024 Lot: [id number]

## 2021-06-13 ENCOUNTER — Other Ambulatory Visit (HOSPITAL_COMMUNITY): Payer: Self-pay

## 2021-06-13 MED FILL — Pirfenidone Cap 267 MG: ORAL | 10 days supply | Qty: 90 | Fill #2 | Status: AC

## 2021-06-13 NOTE — Telephone Encounter (Signed)
Pt's new grant is now active. Successfully billed through insurance and grant info and pt has a $0 copay. Refill activity updated in [place]. Pt called and informed to expect medication to be mailed to her house in the next 1-2 days. Pt verbalized understanding. Nothing further needed at this time.

## 2021-06-16 NOTE — Telephone Encounter (Signed)
Nothing noted in message. Will close encounter.

## 2021-06-26 DIAGNOSIS — J849 Interstitial pulmonary disease, unspecified: Secondary | ICD-10-CM | POA: Diagnosis not present

## 2021-06-26 DIAGNOSIS — G4733 Obstructive sleep apnea (adult) (pediatric): Secondary | ICD-10-CM | POA: Diagnosis not present

## 2021-07-04 ENCOUNTER — Other Ambulatory Visit (HOSPITAL_COMMUNITY): Payer: Self-pay

## 2021-07-08 ENCOUNTER — Other Ambulatory Visit (HOSPITAL_COMMUNITY): Payer: Self-pay

## 2021-07-08 MED FILL — Pirfenidone Cap 267 MG: ORAL | 30 days supply | Qty: 270 | Fill #3 | Status: AC

## 2021-07-11 DIAGNOSIS — N814 Uterovaginal prolapse, unspecified: Secondary | ICD-10-CM | POA: Diagnosis not present

## 2021-07-11 DIAGNOSIS — E119 Type 2 diabetes mellitus without complications: Secondary | ICD-10-CM | POA: Diagnosis not present

## 2021-07-12 ENCOUNTER — Other Ambulatory Visit (HOSPITAL_COMMUNITY): Payer: Self-pay

## 2021-07-14 ENCOUNTER — Other Ambulatory Visit (HOSPITAL_COMMUNITY): Payer: Self-pay

## 2021-07-14 DIAGNOSIS — R0902 Hypoxemia: Secondary | ICD-10-CM | POA: Diagnosis not present

## 2021-07-14 DIAGNOSIS — J84112 Idiopathic pulmonary fibrosis: Secondary | ICD-10-CM | POA: Diagnosis not present

## 2021-07-14 DIAGNOSIS — R0602 Shortness of breath: Secondary | ICD-10-CM | POA: Diagnosis not present

## 2021-07-27 DIAGNOSIS — G4733 Obstructive sleep apnea (adult) (pediatric): Secondary | ICD-10-CM | POA: Diagnosis not present

## 2021-07-27 DIAGNOSIS — J849 Interstitial pulmonary disease, unspecified: Secondary | ICD-10-CM | POA: Diagnosis not present

## 2021-07-28 ENCOUNTER — Telehealth: Payer: Self-pay | Admitting: Pulmonary Disease

## 2021-07-28 DIAGNOSIS — J84112 Idiopathic pulmonary fibrosis: Secondary | ICD-10-CM

## 2021-07-28 DIAGNOSIS — I272 Pulmonary hypertension, unspecified: Secondary | ICD-10-CM

## 2021-07-28 NOTE — Telephone Encounter (Signed)
Pt stated that she had a consultation with [facility] on 07/14/2021 and is wanting to speak with Dr. Vaughan Browner in regards to further direction following the consultation. Pls regard; [phone].

## 2021-07-28 NOTE — Telephone Encounter (Signed)
ATC Patient.  I left Patient a detailed message on VM  (DPR), letting her know I received her message about completing her [facility] consults 07/14/21, and I would send Dr. Vaughan Browner as message.   Message routed to Dr. Vaughan Browner

## 2021-08-03 NOTE — Telephone Encounter (Signed)
Order has been placed for echo with bubble study. Nothing further needed.

## 2021-08-03 NOTE — Telephone Encounter (Signed)
I called and disussed with patient. Reviewed the note from Dr. Randol Kern from [facility] clinic  Please order echocardiogram with bubble study to evaluate for shunt and pulmonary hypertension per their recommendations.

## 2021-08-03 NOTE — Telephone Encounter (Signed)
Pt calling back [phone]

## 2021-08-05 ENCOUNTER — Other Ambulatory Visit (HOSPITAL_COMMUNITY): Payer: Self-pay

## 2021-08-08 ENCOUNTER — Other Ambulatory Visit (HOSPITAL_COMMUNITY): Payer: Self-pay

## 2021-08-10 ENCOUNTER — Other Ambulatory Visit (HOSPITAL_COMMUNITY): Payer: Self-pay

## 2021-08-10 MED FILL — Pirfenidone Cap 267 MG: ORAL | 30 days supply | Qty: 270 | Fill #4 | Status: AC

## 2021-08-11 ENCOUNTER — Other Ambulatory Visit (HOSPITAL_COMMUNITY): Payer: Self-pay

## 2021-08-19 DIAGNOSIS — H5201 Hypermetropia, right eye: Secondary | ICD-10-CM | POA: Diagnosis not present

## 2021-08-19 DIAGNOSIS — H2511 Age-related nuclear cataract, right eye: Secondary | ICD-10-CM | POA: Diagnosis not present

## 2021-08-19 DIAGNOSIS — H02839 Dermatochalasis of unspecified eye, unspecified eyelid: Secondary | ICD-10-CM | POA: Diagnosis not present

## 2021-08-19 DIAGNOSIS — H25011 Cortical age-related cataract, right eye: Secondary | ICD-10-CM | POA: Diagnosis not present

## 2021-08-19 DIAGNOSIS — H35373 Puckering of macula, bilateral: Secondary | ICD-10-CM | POA: Diagnosis not present

## 2021-08-19 DIAGNOSIS — H43812 Vitreous degeneration, left eye: Secondary | ICD-10-CM | POA: Diagnosis not present

## 2021-08-19 DIAGNOSIS — Z961 Presence of intraocular lens: Secondary | ICD-10-CM | POA: Diagnosis not present

## 2021-08-27 DIAGNOSIS — J849 Interstitial pulmonary disease, unspecified: Secondary | ICD-10-CM | POA: Diagnosis not present

## 2021-08-27 DIAGNOSIS — G4733 Obstructive sleep apnea (adult) (pediatric): Secondary | ICD-10-CM | POA: Diagnosis not present

## 2021-09-02 ENCOUNTER — Other Ambulatory Visit: Payer: Self-pay

## 2021-09-02 ENCOUNTER — Ambulatory Visit (HOSPITAL_COMMUNITY)
Admission: RE | Admit: 2021-09-02 | Discharge: 2021-09-02 | Disposition: A | Discharge status: Home / Self Care | Payer: Medicare HMO | Source: Ambulatory Visit | Attending: Pulmonary Disease | Admitting: Pulmonary Disease

## 2021-09-02 DIAGNOSIS — E785 Hyperlipidemia, unspecified: Secondary | ICD-10-CM | POA: Insufficient documentation

## 2021-09-02 DIAGNOSIS — I4891 Unspecified atrial fibrillation: Secondary | ICD-10-CM | POA: Insufficient documentation

## 2021-09-02 DIAGNOSIS — G4730 Sleep apnea, unspecified: Secondary | ICD-10-CM | POA: Diagnosis not present

## 2021-09-02 DIAGNOSIS — J84112 Idiopathic pulmonary fibrosis: Secondary | ICD-10-CM | POA: Insufficient documentation

## 2021-09-02 DIAGNOSIS — I10 Essential (primary) hypertension: Secondary | ICD-10-CM | POA: Diagnosis not present

## 2021-09-02 DIAGNOSIS — I351 Nonrheumatic aortic (valve) insufficiency: Secondary | ICD-10-CM | POA: Insufficient documentation

## 2021-09-02 DIAGNOSIS — I272 Pulmonary hypertension, unspecified: Secondary | ICD-10-CM | POA: Insufficient documentation

## 2021-09-02 LAB — ECHOCARDIOGRAM COMPLETE BUBBLE STUDY
Area-P 1/2: 3.68 cm2
S' Lateral: 2.4 cm

## 2021-09-07 NOTE — Progress Notes (Signed)
I called the patient and she verified her DOB and she reports that she did not have any concerns. She is going to call and make a follow up with Dr. Vaughan Browner.

## 2021-09-09 ENCOUNTER — Other Ambulatory Visit (HOSPITAL_COMMUNITY): Payer: Self-pay

## 2021-09-09 MED FILL — Pirfenidone Cap 267 MG: ORAL | 30 days supply | Qty: 270 | Fill #5 | Status: AC

## 2021-09-16 ENCOUNTER — Other Ambulatory Visit (HOSPITAL_COMMUNITY): Payer: Self-pay

## 2021-09-26 DIAGNOSIS — J849 Interstitial pulmonary disease, unspecified: Secondary | ICD-10-CM | POA: Diagnosis not present

## 2021-09-26 DIAGNOSIS — G4733 Obstructive sleep apnea (adult) (pediatric): Secondary | ICD-10-CM | POA: Diagnosis not present

## 2021-09-30 ENCOUNTER — Other Ambulatory Visit: Payer: Self-pay | Admitting: *Deleted

## 2021-09-30 ENCOUNTER — Telehealth: Payer: Self-pay | Admitting: Internal Medicine

## 2021-09-30 DIAGNOSIS — H5710 Ocular pain, unspecified eye: Secondary | ICD-10-CM

## 2021-09-30 DIAGNOSIS — Z5181 Encounter for therapeutic drug level monitoring: Secondary | ICD-10-CM

## 2021-09-30 NOTE — Telephone Encounter (Signed)
Patient says she would like referral to be submitted to:  Dr. Isidoro Donning [phone]

## 2021-09-30 NOTE — Progress Notes (Signed)
Spoke with Patient's daughter Maria [name] about Patient upcoming OV scheduled 10/18/21.  Marci asked if labs could be placed before OV for liver function for med management of  Pirfenidone.  CMP order placed.

## 2021-09-30 NOTE — Telephone Encounter (Signed)
Patient is requesting a referral to an eye specialist for eye surgery  Please let patient know when referral has been placed [phone]

## 2021-10-05 ENCOUNTER — Telehealth: Payer: Self-pay | Admitting: Internal Medicine

## 2021-10-05 DIAGNOSIS — H53483 Generalized contraction of visual field, bilateral: Secondary | ICD-10-CM | POA: Diagnosis not present

## 2021-10-05 DIAGNOSIS — H0279 Other degenerative disorders of eyelid and periocular area: Secondary | ICD-10-CM | POA: Diagnosis not present

## 2021-10-05 DIAGNOSIS — H02423 Myogenic ptosis of bilateral eyelids: Secondary | ICD-10-CM | POA: Diagnosis not present

## 2021-10-05 DIAGNOSIS — H57813 Brow ptosis, bilateral: Secondary | ICD-10-CM | POA: Diagnosis not present

## 2021-10-05 DIAGNOSIS — H02834 Dermatochalasis of left upper eyelid: Secondary | ICD-10-CM | POA: Diagnosis not present

## 2021-10-05 DIAGNOSIS — H02413 Mechanical ptosis of bilateral eyelids: Secondary | ICD-10-CM | POA: Diagnosis not present

## 2021-10-05 DIAGNOSIS — H02831 Dermatochalasis of right upper eyelid: Secondary | ICD-10-CM | POA: Diagnosis not present

## 2021-10-05 DIAGNOSIS — H0289 Other specified disorders of eyelid: Secondary | ICD-10-CM | POA: Diagnosis not present

## 2021-10-05 NOTE — Telephone Encounter (Signed)
Referral faxed to [phone] per pt request

## 2021-10-05 NOTE — Telephone Encounter (Signed)
Patient called back to check on the status of referral. She scheduled an appointment for today but the office has not been received anything. Advised the referrals can take up 7 days, pt said was told 3-4 days. Requesting a callback for an update on referral.

## 2021-10-05 NOTE — Telephone Encounter (Signed)
The referral was received but the specialist asked if it could be re worded instead of opthalmology it needs to be oculofacial plastic surgery.

## 2021-10-10 ENCOUNTER — Other Ambulatory Visit (HOSPITAL_COMMUNITY): Payer: Self-pay

## 2021-10-11 DIAGNOSIS — H53483 Generalized contraction of visual field, bilateral: Secondary | ICD-10-CM | POA: Diagnosis not present

## 2021-10-12 ENCOUNTER — Other Ambulatory Visit (HOSPITAL_COMMUNITY): Payer: Self-pay

## 2021-10-17 ENCOUNTER — Ambulatory Visit: Payer: Medicare HMO | Admitting: Pulmonary Disease

## 2021-10-18 ENCOUNTER — Encounter: Payer: Self-pay | Admitting: Primary Care

## 2021-10-18 ENCOUNTER — Ambulatory Visit: Payer: Medicare HMO | Admitting: Primary Care

## 2021-10-18 ENCOUNTER — Ambulatory Visit: Payer: Medicare HMO | Admitting: Pulmonary Disease

## 2021-10-18 ENCOUNTER — Other Ambulatory Visit: Payer: Self-pay

## 2021-10-18 ENCOUNTER — Other Ambulatory Visit (HOSPITAL_COMMUNITY): Payer: Self-pay

## 2021-10-18 DIAGNOSIS — J84112 Idiopathic pulmonary fibrosis: Secondary | ICD-10-CM

## 2021-10-18 DIAGNOSIS — G4733 Obstructive sleep apnea (adult) (pediatric): Secondary | ICD-10-CM

## 2021-10-18 DIAGNOSIS — J9611 Chronic respiratory failure with hypoxia: Secondary | ICD-10-CM | POA: Insufficient documentation

## 2021-10-18 DIAGNOSIS — Z5181 Encounter for therapeutic drug level monitoring: Secondary | ICD-10-CM

## 2021-10-18 LAB — COMPREHENSIVE METABOLIC PANEL
ALT: 14 U/L (ref 0–35)
AST: 19 U/L (ref 0–37)
Albumin: 4.3 g/dL (ref 3.5–5.2)
Alkaline Phosphatase: 83 U/L (ref 39–117)
BUN: 12 mg/dL (ref 6–23)
CO2: 31 mEq/L (ref 19–32)
Calcium: 9.6 mg/dL (ref 8.4–10.5)
Chloride: 101 mEq/L (ref 96–112)
Creatinine, Ser: 0.66 mg/dL (ref 0.40–1.20)
GFR: 82.54 mL/min (ref >60.00)
Glucose, Bld: 93 mg/dL (ref 70–99)
Potassium: 4.2 mEq/L (ref 3.5–5.1)
Sodium: 137 mEq/L (ref 135–145)
Total Bilirubin: 0.5 mg/dL (ref 0.2–1.2)
Total Protein: 7 g/dL (ref 6.0–8.3)

## 2021-10-18 MED FILL — Pirfenidone Cap 267 MG: ORAL | 30 days supply | Qty: 270 | Fill #6 | Status: AC

## 2021-10-18 NOTE — Assessment & Plan Note (Addendum)
-  Clinically stable. She is doing well today without acute respiratory complaints. [facility] agreed with diagnosis of IPF, which appears stable on Esbriet. Echocardiogram without evidence of any shunt or pulmonary hypertension. Simple walk test in office today showed no evidence of oxygen desaturation. CMET completed today, results pending. Continue Pirfenidone 871m three times a day. FU in 3 months with Dr. MVaughan Browner

## 2021-10-18 NOTE — Assessment & Plan Note (Addendum)
-  She is compliant with CPAP and reports benefit form use - Pressure 5-15 (12.3cm h20- 95%); Residual AHI 1.2  - Continue CPAP every night for min 4-6 hours or longer  - No changes today

## 2021-10-18 NOTE — Progress Notes (Signed)
_0  ID: Maria [name], female    DOB: 1941-01-12, 79 y.o.   MRN: [id number]  No chief complaint on file.   Referring provider: Biagio Borg, MD  HPI: 80 year old female, former smoker quit in January 1971. PMH signifciant for IPF, OSA, chronic respiratory failure, HTN, afib, GERD, hyperlipidemia, recurrent UTI. Patient of Dr. Vaughan Browner, last seen in April 2022.   Preiv[facility] pulmonary encoutner: 80 year old with history of hypertension, stroke, sleep apnea. Referred for evaluation of interstitial lung disease This was noted on CT scan done in 2018.  Notes increasing cough, dyspnea over the past few months.  Symptoms worsen after COVID-19 vaccination.  No mucus production, wheezing or chills.  She had an exposure to DDT as a child ( middle School) and had severe respiratory problems ever since, including multiple flu illnesses that would take her 6-8 weeks to recover from.  Pets: No pets Occupation: Retired Quarry manager at [employer] Exposures: Reports exposure to DDD as a child and to acids in her lab at work but no known ongoing exposures.  No mold, hot tub, Jacuzzi.  No asbestos, down pillows or comforters Smoking history: 5-pack-year smoker.  Quit in 1971 Travel history: No significant travel history Relevant family history: Grandfather had emphysema.  He was a pipe smoker  03/2021- Dr. Avel Sensor Esbriet well.  She has occasional nausea Continues on supplemental oxygen at 4 L, on CPAP at home She has started pulmonary rehab and notes improvement in exercise capacity and dyspnea  Remains active with hiking and water aerobics.  07/14/21 [facility] Consult: Maria [name] is a 80 y.o. female with diffuse parenchymal lung disease of unclear etiology. Imaging is in a probable UIP pattern. No compelling exposures have been identified. A comprehensive serological evaluation revealed only an atypical p-ANCA which seems irrelevant to me. In this setting, a diagnosis of IPF  is appropriate. There is not need for surgical biopsy. Treating her with pirfenidone is also appropriate. Nintedanib would be an option. She would be a reasonable candidate for many IPF clinical trials.   Her FVC has remained stable, but DLCO has declined. She qualified for O2 today, but less than she has needed in the past. Her RVSP was modestly elevated on her last echo almost a year ago. It would be prudent to update her echo, and proceed with RHC if pressures seem higher. I am somewhat struck that she needs O2 with her current imaging and PFTs. It would be prudent to get a bubble study as well to see if she might have an intracardiac shunt.   If she needs an RHC, I am happy to refer her to our Pulmonary Hypertension group here for further management.   She has had difficulties with vaccines, including the second Shingrix which made her sick for 10 days. Her respiratory status worsened after her second [employer]. I can understand some hesitancy on their part to get another Covid vaccine. Even though we are seeing a surge now, I don't think the benefit of a booster with current variant(s) warrants the risk of triggering another exacerbation. As variants and vaccines evolve, I would reconsider this on a case-by-case basis. Unfortunately, under current guidelines, she would not qualify for Evusheld (tixgevimab-cilgavimab).   She exercises aspiration precautions appropriately. She will continue CPAP. Controlled GER and OSA may be particularly important in IPF.   Maria [name] is happy with her care under Dr Matilde Bash guidance, and came here today primarily for a second opinion. I will be  happy to see her in the future, but for now she will follow locally. Once again, if she needs evaluation by our [place] group, I will be happy to help set that up.   Plan:  Patient Instructions  I agree with the diagnosis of IPF. Esbriet is a good drug to slow this down. I would continue it as long as you tolerate it.  Stay  active. You only needed 2 LPM O2 today with what you did, but you might still need 4 LPM with heavier exertion.  Upate your echocardiogram at home. I would want them to look for an intracardiac shunt at the same time. If you have evidence of pulmonary hypertension, I will refer you to our experts here.   Continue CPAP  Continue to avoid eating a couple hrs before bedtime and sleep elevated.    10/18/2021- Interim hx  Patient presents today for follow-up visit.  She was supposed to see Dr. Vaughan Browner at 10 AM this morning but was late for her appointment so scheduled with APP. This is my first time meeting patient. She has hx postinflammatory pulmonary fibrosis along with obstructive sleep apnea. She saw Dr. Randol Kern from [facility] clinic in July 2022. Ordered for echocardiogram with bubble study to evaluate for shunt and pulmonary HTN.   She is doing well today, she does not need to use oxygen at all times. She is staying active. She was able to go to line dancing this weekend and used oxygen as needed. She wears 4L oxygen at night. Ambulatory O2 on simple walk test today stayed above 93% RA with 3 laps. She has [employer]ogen POC and home concentrator + tanks. She plans on traveling to [place] for 10 days in the next year.    Airview download 09/18/21-10/17/21 30/30 days used; 93% > 4 hours Average usage 6 hours 24 mins Pressure 5-15 (12.3cm h20- 95%) Airleaks 40.6L/min (95%) AHI 1.2    Testing:  Pulmonary function testing 07/14/21 with [facility] reveals no restriction and mild gas exchange impairment. DLCO has declined from prior baseline at home, but FVC is stable. Specifically, Pulmonary Function Test (PFT) Latest Ref Rng & Units 07/14/2021 07/14/2021  FVC PRE L 1.79 -  FVC % PRE PRED % 77 % 77 %  FVCZ-SCORE - -1.26 -1.26  FEV1 PRE L 1.43 -  FEV1 % PRE PRED % 80 % 80 %  FEV1/FVC PRE % 79.88 -  FEV1Z-SCORE - -1.08 -1.08  FEF25-75% PRE L/s 1.31 -  FEF25-75% % PRE PRED % 90 % 90 %  FEF25-75% Z-SCORE -  -0.24 -0.24  TLC PRE L 2.74 -  RV PRE L 0.62 -  DLCO PRE ml/(min*mmHg) 11.84 -  DLCO % PRE PRED % 68.1 68.1  DLCOSINGLEBREATHZ-SCORE - -2.16 -2.16   Prior PFTs at home  FVC DLCO 04/17/2017 1.69 (64%) 14.9 (65%) 06/17/2020 1.72 (68%) 14.5 (79%)  Walk tests: 07/14/21- Converted to titration study indicating the need for 2 LPM with ambulation.   CT chest 04/15/2020 and 03/29/2017. These studies show bibasilar traction, peripheral reticulation, mild patchy ground glass. There is no honeycombing. Disease is mild radiographically, worsening modestly in the interval. A small hiatal hernia is present. Hepatic steatosis is present. Aortic atherosclerosis is present. Overall, probable UIP pattern by current standards.   Allergies  Allergen Reactions   Amoxicillin Hives   Ace Inhibitors Other (See Comments)    unknown   Bee Venom    Benicar [Olmesartan Medoxomil] Other (See Comments)    Tongue swelling  Codeine     REACTION: nausea   Covid-19 (Mrna) Vaccine     Increased shortness of Breath in Fibrosis   Lisinopril Other (See Comments)    unknown   Macrobid [Nitrofurantoin Monohyd Macro]     Pulmonary Fibrosis   Nutritional Supplements     Bee stings cause angioedema-carry an EPIPEN   Shingrix [Zoster Vac Recomb Adjuvanted] Other (See Comments)    Chills, feverish, nausea x 4 days    Immunization History  Administered Date(s) Administered   Influenza Split 10/22/2012, 08/25/2013   Influenza Whole 08/18/2010   Influenza, High Dose Seasonal PF 12/16/2015, 12/01/2016, 10/30/2017, 10/29/2018   Influenza, Quadrivalent, Recombinant, Inj, Pf 08/23/2019   Influenza,inj,Quad PF,6+ Mos 01/05/2015   Influenza-Unspecified 10/18/2018   PFIZER(Purple Top)SARS-COV-2 Vaccination 02/13/2020, 03/09/2020   Pneumococcal Conjugate-13 10/23/2013   Pneumococcal Polysaccharide-23 08/18/2008   Td 11/10/2014   Zoster Recombinat (Shingrix) 09/08/2019    Past Medical History:  Diagnosis Date   Anemia     Arthritis    Bronchitis    Carpal tunnel syndrome    in calves neuropathy   Diverticulosis    GERD (gastroesophageal reflux disease)    Headache(784.0)    chronic   High cholesterol    Hyperlipidemia    Hypertension    Migraine    Nephritis    OSA (obstructive sleep apnea)    Palpitations    Personal history of colonic adenomas 09/15/2010   Stroke ([place])    TIA (transient ischemic attack)    Vertigo, benign paroxysmal     Tobacco History: Social History   Tobacco Use  Smoking Status Former   Packs/day: 1.00   Years: 10.00   Pack years: 10.00   Types: Cigarettes   Quit date: 12/18/1969   Years since quitting: 51.8  Smokeless Tobacco Never   Counseling given: Not Answered   Outpatient Medications Prior to Visit  Medication Sig Dispense Refill   amLODipine (NORVASC) 5 MG tablet TAKE 1 TABLET EVERY DAY 90 tablet 2   b complex vitamins tablet Take 1 tablet by mouth daily.     CALCIUM PO Take by mouth daily.     Cholecalciferol (VITAMIN D-3 PO) Take by mouth daily.     clopidogrel (PLAVIX) 75 MG tablet TAKE 1 TABLET EVERY DAY 90 tablet 2   co-enzyme Q-10 30 MG capsule Take 30 mg by mouth 3 (three) times daily.     Collagen Hydrolysate POWD 1 Scoop by Does not apply route daily.     ferrous sulfate 324 MG TBEC Take 324 mg by mouth every other day.     hydrochlorothiazide (MICROZIDE) 12.5 MG capsule TAKE 1 CAPSULE EVERY DAY 90 capsule 2   levocetirizine (XYZAL) 5 MG tablet TAKE 1 TABLET EVERY EVENING 90 tablet 3   LUTEIN PO Take by mouth daily.     montelukast (SINGULAIR) 10 MG tablet TAKE 1 TABLET AT BEDTIME 90 tablet 2   Multiple Vitamins-Minerals (WOMENS MULTIVITAMIN PO) Take 1 capsule by mouth daily.     oxybutynin (DITROPAN-XL) 10 MG 24 hr tablet TAKE 1 TABLET BY MOUTH Q DAILY 90 tablet 3   pantoprazole (PROTONIX) 40 MG tablet Take 1 tablet (40 mg total) by mouth 2 (two) times daily. 180 tablet 2   Pirfenidone 267 MG CAPS TAKE 3 CAPSULES BY MOUTH WITH BREAKFAST, WITH  LUNCH, AND WITH EVENING MEAL. 270 capsule 11   potassium chloride (KLOR-CON) 10 MEQ tablet TAKE 1 TABLET EVERY DAY 90 tablet 2   simvastatin (ZOCOR) 10 MG tablet  TAKE 1 TABLET EVERY DAY  AT  6  PM 90 tablet 2   trimethoprim (TRIMPEX) 100 MG tablet Take 100 mg by mouth daily.      Turmeric 500 MG CAPS Take 1 capsule by mouth daily.     zinc gluconate 50 MG tablet Take 50 mg by mouth daily.     albuterol (PROAIR HFA) 108 (90 Base) MCG/ACT inhaler INHALE 2 PUFFS INTO THE LUNCH EVERY 6 HOURS AS NEEDED FOR WHEEZING OR SHORTNESS OF BREATH (Patient not taking: No sig reported) 18 g 2   No facility-administered medications prior to visit.    Review of Systems  Review of Systems  Constitutional: Negative.   HENT: Negative.    Respiratory: Negative.      Physical Exam  BP 122/70 (BP Location: Right Arm, Patient Position: Sitting, Cuff Size: Normal)   Pulse 74   Temp 98.8 F (37.1 C) (Oral)   Ht 5' 2.5" (1.588 m)   Wt 155 lb 12.8 oz (70.7 kg)   SpO2 97%   BMI 28.04 kg/m  Physical Exam Constitutional:      Appearance: Normal appearance.  HENT:     Head: Normocephalic and atraumatic.     Mouth/Throat:     Mouth: Mucous membranes are moist.     Pharynx: Oropharynx is clear.  Cardiovascular:     Rate and Rhythm: Normal rate and regular rhythm.  Pulmonary:     Effort: Pulmonary effort is normal.     Breath sounds: Rales present. No wheezing or rhonchi.     Comments: O2 97% RA  Musculoskeletal:        General: Normal range of motion.  Skin:    General: Skin is warm and dry.  Neurological:     General: No focal deficit present.     Mental Status: She is alert and oriented to person, place, and time. Mental status is at baseline.  Psychiatric:        Mood and Affect: Mood normal.        Behavior: Behavior normal.        Thought Content: Thought content normal.        Judgment: Judgment normal.     Lab Results:  CBC    Component Value Date/Time   WBC 6.7 03/15/2021 1208    RBC 4.39 03/15/2021 1208   HGB 13.6 03/15/2021 1208   HCT 39.4 03/15/2021 1208   PLT 310.0 03/15/2021 1208   MCV 89.9 03/15/2021 1208   MCH 29.5 08/05/2020 1443   MCHC 34.4 03/15/2021 1208   RDW 13.4 03/15/2021 1208   LYMPHSABS 1.9 03/15/2021 1208   MONOABS 0.8 03/15/2021 1208   EOSABS 0.1 03/15/2021 1208   BASOSABS 0.1 03/15/2021 1208    BMET    Component Value Date/Time   [name] 137 10/18/2021 1039   K 4.2 10/18/2021 1039   CL 101 10/18/2021 1039   CO2 31 10/18/2021 1039   GLUCOSE 93 10/18/2021 1039   BUN 12 10/18/2021 1039   CREATININE 0.66 10/18/2021 1039   CALCIUM 9.6 10/18/2021 1039   GFRNONAA >60 02/02/2011 0313   GFRAA  02/02/2011 0313    >60        The eGFR has been calculated using the MDRD equation. This calculation has not been validated in all clinical situations. eGFR's persistently <60 mL/min signify possible Chronic Kidney Disease.    BNP No results found for: BNP  ProBNP    Component Value Date/Time   PROBNP 24 09/09/2020 1556  Imaging: No results found.   Assessment & Plan:   IPF (idiopathic pulmonary fibrosis) (HCC) - Clinically stable. She is doing well today without acute respiratory complaints. [facility] agreed with diagnosis of IPF, which appears stable on Esbriet. Echocardiogram without evidence of any shunt or pulmonary hypertension. Simple walk test in office today showed no evidence of oxygen desaturation. CMET completed today, results pending. Continue Pirfenidone 866m three times a day. FU in 3 months with Dr. MVaughan Browner   Obstructive sleep apnea - She is compliant with CPAP and reports benefit form use - Pressure 5-15 (12.3cm h20- 95%); Residual AHI 1.2  - Continue CPAP every night for min 4-6 hours or longer  - No changes today    Chronic respiratory failure with hypoxia (HCC) - Continue 4L oxygen at night and 2L with moderate exertion to maintain O2 > 88-90%   40 mins spent on case; >50% face to face with patient  EMartyn Ehrich NP 10/18/2021

## 2021-10-18 NOTE — Assessment & Plan Note (Addendum)
-  Continue 4L oxygen at night and 2L with moderate exertion to maintain O2 > 88-90%

## 2021-10-18 NOTE — Patient Instructions (Addendum)
  [facility] agreed with diagnosis of IPF which appears stable on Esbriet  Echocardiogram looked normal, no evidence of any shunt or pulmonary hypertension   Recommendations: - Continue Pirfenidone 852m three times a day  - Continue CPAP every night for min 4-6 hours or longer  - Continue 2L oxygen at night and with moderate exertion to maintain O2 > 88-90% (call oxygen company to inquire about travel O2 needs)  Orders: - CMET done today (this has liver function included in it)  Follow-up: - 3 months with Dr. MVaughan Browner

## 2021-10-19 ENCOUNTER — Other Ambulatory Visit (HOSPITAL_COMMUNITY): Payer: Self-pay

## 2021-10-19 DIAGNOSIS — G4733 Obstructive sleep apnea (adult) (pediatric): Secondary | ICD-10-CM | POA: Diagnosis not present

## 2021-10-27 DIAGNOSIS — G4733 Obstructive sleep apnea (adult) (pediatric): Secondary | ICD-10-CM | POA: Diagnosis not present

## 2021-10-27 DIAGNOSIS — N814 Uterovaginal prolapse, unspecified: Secondary | ICD-10-CM | POA: Diagnosis not present

## 2021-10-27 DIAGNOSIS — J849 Interstitial pulmonary disease, unspecified: Secondary | ICD-10-CM | POA: Diagnosis not present

## 2021-10-27 DIAGNOSIS — N3946 Mixed incontinence: Secondary | ICD-10-CM | POA: Diagnosis not present

## 2021-11-01 DIAGNOSIS — H2511 Age-related nuclear cataract, right eye: Secondary | ICD-10-CM | POA: Diagnosis not present

## 2021-11-01 DIAGNOSIS — H25011 Cortical age-related cataract, right eye: Secondary | ICD-10-CM | POA: Diagnosis not present

## 2021-11-01 DIAGNOSIS — Z961 Presence of intraocular lens: Secondary | ICD-10-CM | POA: Diagnosis not present

## 2021-11-01 DIAGNOSIS — H18413 Arcus senilis, bilateral: Secondary | ICD-10-CM | POA: Diagnosis not present

## 2021-11-08 ENCOUNTER — Other Ambulatory Visit (HOSPITAL_COMMUNITY): Payer: Self-pay

## 2021-11-11 ENCOUNTER — Other Ambulatory Visit (HOSPITAL_COMMUNITY): Payer: Self-pay

## 2021-11-14 ENCOUNTER — Other Ambulatory Visit (HOSPITAL_COMMUNITY): Payer: Self-pay

## 2021-11-14 DIAGNOSIS — H2511 Age-related nuclear cataract, right eye: Secondary | ICD-10-CM | POA: Diagnosis not present

## 2021-11-21 ENCOUNTER — Telehealth: Payer: Self-pay | Admitting: Primary Care

## 2021-11-21 ENCOUNTER — Other Ambulatory Visit (HOSPITAL_COMMUNITY): Payer: Self-pay

## 2021-11-21 ENCOUNTER — Other Ambulatory Visit: Payer: Self-pay | Admitting: Pulmonary Disease

## 2021-11-21 DIAGNOSIS — J8410 Pulmonary fibrosis, unspecified: Secondary | ICD-10-CM

## 2021-11-22 ENCOUNTER — Other Ambulatory Visit: Payer: Self-pay | Admitting: Pulmonary Disease

## 2021-11-22 ENCOUNTER — Other Ambulatory Visit (HOSPITAL_COMMUNITY): Payer: Self-pay

## 2021-11-22 DIAGNOSIS — J8410 Pulmonary fibrosis, unspecified: Secondary | ICD-10-CM

## 2021-11-23 ENCOUNTER — Other Ambulatory Visit (HOSPITAL_COMMUNITY): Payer: Self-pay

## 2021-11-23 MED ORDER — ESBRIET 267 MG PO CAPS
ORAL_CAPSULE | ORAL | 1 refills | Status: DC
  Filled 2021-11-23: qty 243, 27d supply, fill #0
  Filled 2021-11-23: qty 27, 3d supply, fill #0
  Filled 2021-12-19: qty 270, 30d supply, fill #0
  Filled 2021-12-19 (×2): qty 270, 30d supply, fill #1
  Filled 2021-12-19: qty 243, 27d supply, fill #0

## 2021-11-23 NOTE — Telephone Encounter (Signed)
Refill sent for PIRFENIDONE to [facility]: [phone]   Dose: 801 mg three times daily  Last OV: 10/18/21 (with Derl Barrow, NP) Provider: Dr. Vaughan Browner  Next OV: supposed to be in February 2022 LFTs wnl on 10/18/21 - next due February 2022  Knox Saliva, PharmD, MPH, BCPS Clinical Pharmacist (Rheumatology and Pulmonology)

## 2021-11-23 NOTE — Telephone Encounter (Signed)
Notes are in [place] can pull records from [facility] Msg sent to [facility] regarding this

## 2021-11-24 ENCOUNTER — Other Ambulatory Visit (HOSPITAL_COMMUNITY): Payer: Self-pay

## 2021-11-30 ENCOUNTER — Telehealth: Payer: Self-pay

## 2021-11-30 NOTE — Telephone Encounter (Signed)
Per Rinaldo Ratel, pt's current PA is expiring.  Attempted to submit renewal on CoverMyMeds, however request is canceled  due to current auth approval from 12/19/2019 to 12/17/2022

## 2021-12-02 ENCOUNTER — Other Ambulatory Visit (HOSPITAL_COMMUNITY): Payer: Self-pay

## 2021-12-08 ENCOUNTER — Other Ambulatory Visit (HOSPITAL_COMMUNITY): Payer: Self-pay

## 2021-12-19 ENCOUNTER — Other Ambulatory Visit (HOSPITAL_COMMUNITY): Payer: Self-pay

## 2021-12-22 ENCOUNTER — Other Ambulatory Visit (HOSPITAL_COMMUNITY): Payer: Self-pay

## 2021-12-28 ENCOUNTER — Other Ambulatory Visit (HOSPITAL_COMMUNITY): Payer: Self-pay

## 2022-01-02 ENCOUNTER — Encounter: Payer: Self-pay | Admitting: Physician Assistant

## 2022-01-02 ENCOUNTER — Other Ambulatory Visit: Payer: Self-pay

## 2022-01-02 ENCOUNTER — Telehealth: Payer: Self-pay | Admitting: Pulmonary Disease

## 2022-01-02 ENCOUNTER — Telehealth: Payer: Self-pay

## 2022-01-02 ENCOUNTER — Telehealth (INDEPENDENT_AMBULATORY_CARE_PROVIDER_SITE_OTHER): Payer: Medicare HMO | Admitting: Physician Assistant

## 2022-01-02 VITALS — Temp 100.0°F | Ht 62.5 in

## 2022-01-02 DIAGNOSIS — U071 COVID-19: Secondary | ICD-10-CM | POA: Diagnosis not present

## 2022-01-02 DIAGNOSIS — J8410 Pulmonary fibrosis, unspecified: Secondary | ICD-10-CM

## 2022-01-02 MED ORDER — MOLNUPIRAVIR EUA 200MG CAPSULE: 4.0000 | ORAL_CAPSULE | Freq: Two times a day (BID) | ORAL | 0 refills | Status: AC

## 2022-01-02 NOTE — Progress Notes (Signed)
Virtual Visit via Video Note  I connected with  Maria [name]  on 01/02/22 at 11:30 AM EST by a video enabled telemedicine application and verified that I am speaking with the correct person using two identifiers.  Location: Patient: home Provider: Therapist, music at [place] present: Patient and myself   I discussed the limitations of evaluation and management by telemedicine and the availability of in person appointments. The patient expressed understanding and agreed to proceed.   History of Present Illness:  Chief complaint: COVID-19 positive via home test today  Symptom onset: 12/30/21 Pertinent positives: Chills, fever, body aches, productive cough (less today), SOB - hx Pulmonary Fibrosis - requiring more oxygen last week Pertinent negatives: Chest pain, urinary decrease, n/v/d, abdominal pain Treatments tried: Wearing N-95 mask; pushing fluids, cough drops, Vit C Vaccine status: First two COVID-19 shots only  Sick exposure: Several social activities recently, but no known sick contacts   Observations/Objective:  Currently on 4 L/min oxygen; pulse ox is not working currently  Gen: Awake, alert, no acute distress Resp: Breathing is even and non-labored; not pausing for breaths Psych: calm/pleasant demeanor   Assessment and Plan:  1. COVID-19 2. Postinflammatory pulmonary fibrosis (HCC) Diagnosis confirmed via home antigen test.  We discussed current algorithm recommendations for prescribing outpatient antivirals.  The patient is not UTD on COVID vaccinations, she is still within 5 day window of symptom onset, and she is high-risk - antiviral therapy is advised.  Risks versus benefits discussed. Will start on Molnupiravir, possible SE discussed with her.  Also advised patient to call her pulmonologist, Dr. Vaughan Browner with update of her positive test and for any other advice they could offer her. She needs to get her pulse ox working again.  Advised  self-isolation at home for the next 5 days and then masking around others for at least an additional 5 days.  Treat supportively at this time as well including sleeping prone, deep breathing exercises, pushing fluids, walking every few hours, vitamins C and D, and Tylenol or ibuprofen as needed.  The patient understands that COVID-19 illness can wax and wane.  Should the symptoms acutely worsen or patient starts to experience sudden shortness of breath, chest pain, severe weakness, the patient will go straight to the emergency department.  Also advised home pulse oximetry monitoring and for any reading consistently under 92%, should also report to the emergency department.  The patient will continue to keep [place] updated.   Follow Up Instructions:    I discussed the assessment and treatment plan with the patient. The patient was provided an opportunity to ask questions and all were answered. The patient agreed with the plan and demonstrated an understanding of the instructions.   The patient was advised to call back or seek an in-person evaluation if the symptoms worsen or if the condition fails to improve as anticipated.  Video connection was lost at >50% of the duration of the visit, at which time the remainder of the visit was completed via audio only.  Total time on phone today: 12 min 29 seconds  Green Quincy M Caramia Boutin, PA-C

## 2022-01-02 NOTE — Telephone Encounter (Signed)
Spoke with the pt  She states tested pos for covid on at home test 01/02/22  She started with fever, chills, aches, cough with yellow sputum, HA on 12/30/21  Pt had 3 covid vaccines total  She states her PCP did video visit with her today and prescribed molnupiravir  She was advised to call us and make sure this is okay and see if we have any more recs  Sarah, please advise thanks!  Allergies  Allergen Reactions   Amoxicillin Hives   Ace Inhibitors Other (See Comments)    unknown   Bee Venom    Benicar [Olmesartan Medoxomil] Other (See Comments)    Tongue swelling   Codeine     REACTION: nausea   Covid-19 (Mrna) Vaccine     Increased shortness of Breath in Fibrosis   Lisinopril Other (See Comments)    unknown   Macrobid [Nitrofurantoin Monohyd Macro]     Pulmonary Fibrosis   Nutritional Supplements     Bee stings cause angioedema-carry an EPIPEN   Shingrix [Zoster Vac Recomb Adjuvanted] Other (See Comments)    Chills, feverish, nausea x 4 days

## 2022-01-02 NOTE — Patient Instructions (Signed)
Very low threshold for going to the ER! Please call with updates. Take the medication as directed. Call your pulmonologist.

## 2022-01-02 NOTE — Telephone Encounter (Signed)
Ok with me 

## 2022-01-02 NOTE — Telephone Encounter (Signed)
Patient is requesting TOC from [facility] to [facility] due to closer proximity.    Please advise.

## 2022-01-02 NOTE — Telephone Encounter (Signed)
Magdalen Spatz, NP to Me      1:42 PM  If she has wheezing we can send In prednisone taper. Can you call and see if she is wheezing? Also encourage Vitamin D and zinc.Thanks.   I spoke with the pt and notified of response per Judson Roch. She verbalized understanding. She says she is "not wheezing any more than usual"- only minimal wheezing every now and then.

## 2022-01-02 NOTE — Telephone Encounter (Signed)
Magdalen Spatz, NP  Rosana Berger, CMA Caller: Unspecified (Today, 11:54 AM) Have her start the anti viral and call us if her breathing gets any worse. Thanks so much   Spoke with the pt and notified of response per Judson Roch  She verbalized understanding  Nothing further needed

## 2022-01-04 NOTE — Telephone Encounter (Signed)
Called and spoke with pt who states that she no longer has any chills, fever, or body aches but states that she still has a lot of mucus in her chest. Asked pt if she was taking mucinex which would help with the phlegm and she said that she had run out but has been able to get some more which she is going to begin taking again.  Pt said that she started taking the molnupiravir Monday 1/16. Stated to pt after finishing the meds, if still no better after that to call us back and we could further discuss with Dr. Vaughan Browner and she verbalized understanding. Nothing further needed.

## 2022-01-05 NOTE — Telephone Encounter (Signed)
Patient scheduled.

## 2022-01-05 NOTE — Telephone Encounter (Signed)
Pt scheduled

## 2022-01-11 ENCOUNTER — Other Ambulatory Visit (HOSPITAL_COMMUNITY): Payer: Self-pay

## 2022-01-13 ENCOUNTER — Other Ambulatory Visit: Payer: Self-pay | Admitting: Pulmonary Disease

## 2022-01-13 ENCOUNTER — Other Ambulatory Visit (HOSPITAL_COMMUNITY): Payer: Self-pay

## 2022-01-13 DIAGNOSIS — J8410 Pulmonary fibrosis, unspecified: Secondary | ICD-10-CM

## 2022-01-13 MED ORDER — ESBRIET 267 MG PO CAPS
ORAL_CAPSULE | ORAL | 0 refills | Status: DC
  Filled 2022-01-13 – 2022-01-16 (×2): qty 270, 30d supply, fill #0

## 2022-01-13 NOTE — Telephone Encounter (Signed)
Refill sent for PIRFENIDONE to [facility]: [phone]   Dose:  801 mg three times daily  Last OV: 10/18/21 Provider: Dr. Vaughan Browner  Next OV: 3 months not scheduled. Routing to scheduling team to assist in having patient scheduled in 1-2 months with Dr. Vaughan Browner.  Knox Saliva, PharmD, MPH, BCPS Clinical Pharmacist (Rheumatology and Pulmonology)

## 2022-01-16 ENCOUNTER — Other Ambulatory Visit (HOSPITAL_COMMUNITY): Payer: Self-pay

## 2022-01-16 ENCOUNTER — Telehealth: Payer: Self-pay | Admitting: Pharmacist

## 2022-01-16 DIAGNOSIS — J8410 Pulmonary fibrosis, unspecified: Secondary | ICD-10-CM

## 2022-01-16 MED ORDER — ESBRIET 267 MG PO CAPS
ORAL_CAPSULE | ORAL | 5 refills | Status: DC
Start: 2022-01-16 — End: 2023-02-05
  Filled 2022-01-16: qty 270, 30d supply, fill #0
  Filled 2022-01-18 – 2022-02-10 (×2): qty 270, 30d supply, fill #1
  Filled 2022-03-28: qty 270, 30d supply, fill #2
  Filled 2022-04-21: qty 270, 30d supply, fill #3
  Filled 2022-06-01: qty 270, 30d supply, fill #4
  Filled 2022-06-22: qty 270, 30d supply, fill #5

## 2022-01-16 NOTE — Telephone Encounter (Signed)
Received message from [facility] that patient is requesting brand name Esbriet but Josem Kaufmann is only approved for generic pirfenidone.  I called patient to determine if she has tried generic in the past. She states she has not tried generic. She is willing to try generic but it cannot be from [place]. I advised that [facility] may be able to find a generic that is not manufacturer from [place]. I also reviewed that brand name will no longer be approved by most insurance plans unless patient has had a reaction to generic. She verbalized understanding.  Spoke with Tiffany at [facility] who states they will try to find generic that has not been manufactured in [place].  Knox Saliva, PharmD, MPH, BCPS Clinical Pharmacist (Rheumatology and Pulmonology)

## 2022-01-17 ENCOUNTER — Other Ambulatory Visit: Payer: Self-pay

## 2022-01-17 ENCOUNTER — Other Ambulatory Visit (HOSPITAL_COMMUNITY): Payer: Self-pay

## 2022-01-17 ENCOUNTER — Ambulatory Visit (INDEPENDENT_AMBULATORY_CARE_PROVIDER_SITE_OTHER): Payer: Medicare HMO | Admitting: Physician Assistant

## 2022-01-17 VITALS — BP 114/73 | HR 77 | Temp 98.0°F | Ht 61.5 in | Wt 150.4 lb

## 2022-01-17 DIAGNOSIS — M546 Pain in thoracic spine: Secondary | ICD-10-CM | POA: Diagnosis not present

## 2022-01-17 DIAGNOSIS — J84112 Idiopathic pulmonary fibrosis: Secondary | ICD-10-CM

## 2022-01-17 NOTE — Telephone Encounter (Signed)
Patient scheduled 01/30/2022 at 9:15am with Dr. Vaughan Browner.

## 2022-01-17 NOTE — Patient Instructions (Addendum)
Good to see you today!   Please call [facility] for XRAY appointment [phone] of your upper back. Pending your XRAY results will determine treatment from there.  Please call Dr. Matilde Bash office for follow up appointment. It looks like you may need a repeat Chest CT scan.  [phone]  Please schedule your annual mammogram as well.

## 2022-01-17 NOTE — Progress Notes (Signed)
Subjective:    Patient ID: Maria [name], female    DOB: 23-Feb-1941, 81 y.o.   MRN: [id number]  Chief Complaint  Patient presents with   Transitions Of Care       81 y.o. patient presents today for new patient establishment with me.  Patient was previously established with Dr. Jenny Reichmann.  Current Care Team: Pulmonology - [facility] / GYN - incontinence, uterovaginal prolapse Cardiology [facility] - Dr. Talbert Forest  Acute Concerns: Upper back pain along bra-line - NKI. Started a few months ago. Constant pain. Swimming does seem to help.  Feels herself hunching over. No pain down her arms. Says her height has decreased from 5'4" to 5' 1.5" in the last few years.   Chronic Concerns: -Neuropathy burning pain in calves, used to be at 3 am only, more intense every three hours in the last few years. She has seen chiropractor who did a TENS unit and says that helps some. -Dr. Jenny Reichmann offered neurontin at one time, but said she didn't want to take it unless she knew where it was coming from -Hx of prediabetes and says she hasn't had a "work-up" done in awhile -Hx iron deficiency, but says that iron pills make her feel sick     Past Medical History:  Diagnosis Date   Anemia    Arthritis    Bronchitis    Carpal tunnel syndrome    in calves neuropathy   Diverticulosis    GERD (gastroesophageal reflux disease)    Headache(784.0)    chronic   High cholesterol    Hyperlipidemia    Hypertension    Migraine    Nephritis    OSA (obstructive sleep apnea)    Palpitations    Personal history of colonic adenomas 09/15/2010   Stroke ([place])    TIA (transient ischemic attack)    Vertigo, benign paroxysmal     Past Surgical History:  Procedure Laterality Date   BLADDER REPAIR     BREAST BIOPSY     rt benign cyst   CARPAL TUNNEL RELEASE     x 2   COLONOSCOPY     REPLACEMENT TOTAL KNEE BILATERAL     TONSILLECTOMY     UPPER GASTROINTESTINAL  ENDOSCOPY      Family History  Problem Relation Age of Onset   Diabetes Father    Aneurysm Father    Kidney disease Father    Hypertension Father    Heart disease Mother    Colon polyps Mother    Chronic bronchitis Mother    Diabetes Brother    Colon cancer Maternal Grandmother    Irritable bowel syndrome Daughter    Emphysema Maternal Grandfather        smoked a pipe    Social History   Tobacco Use   Smoking status: Former    Packs/day: 1.00    Years: 10.00    Pack years: 10.00    Types: Cigarettes    Quit date: 12/18/1969    Years since quitting: 52.1   Smokeless tobacco: Never  Vaping Use   Vaping Use: Never used  Substance Use Topics   Alcohol use: Yes    Comment: ocassional use   Drug use: No     Allergies  Allergen Reactions   Amoxicillin Hives   Ace Inhibitors Other (See Comments)    unknown   Bee Venom    Benicar [Olmesartan Medoxomil] Other (See Comments)    Tongue swelling  Codeine     REACTION: nausea   Covid-19 (Mrna) Vaccine     Increased shortness of Breath in Fibrosis   Lisinopril Other (See Comments)    unknown   Macrobid [Nitrofurantoin Monohyd Macro]     Pulmonary Fibrosis   Nutritional Supplements     Bee stings cause angioedema-carry an EPIPEN   Shingrix [Zoster Vac Recomb Adjuvanted] Other (See Comments)    Chills, feverish, nausea x 4 days    Review of Systems  Musculoskeletal:  Positive for back pain.  NEGATIVE UNLESS OTHERWISE INDICATED IN HPI      Objective:     BP 114/73    Pulse 77    Temp 98 F (36.7 C)    Ht 5' 1.5" (1.562 m)    Wt 150 lb 6.1 oz (68.2 kg)    SpO2 96%    BMI 27.95 kg/m   Wt Readings from Last 3 Encounters:  01/17/22 150 lb 6.1 oz (68.2 kg)  10/18/21 155 lb 12.8 oz (70.7 kg)  04/19/21 166 lb 7.2 oz (75.5 kg)    BP Readings from Last 3 Encounters:  01/17/22 114/73  10/18/21 122/70  04/04/21 134/68     Physical Exam Vitals and nursing note reviewed.  Constitutional:      Appearance: Normal  appearance. She is normal weight. She is not toxic-appearing.  HENT:     Head: Normocephalic and atraumatic.     Right Ear: Ear canal and external ear normal.     Left Ear: Ear canal and external ear normal.     Nose: Nose normal.     Mouth/Throat:     Mouth: Mucous membranes are moist.  Eyes:     Extraocular Movements: Extraocular movements intact.     Conjunctiva/sclera: Conjunctivae normal.     Pupils: Pupils are equal, round, and reactive to light.  Cardiovascular:     Rate and Rhythm: Normal rate and regular rhythm.     Pulses: Normal pulses.     Heart sounds: Normal heart sounds.  Pulmonary:     Effort: Pulmonary effort is normal.     Breath sounds: Normal breath sounds.  Abdominal:     General: Abdomen is flat. Bowel sounds are normal.     Palpations: Abdomen is soft.  Musculoskeletal:        General: No swelling or deformity. Normal range of motion.     Cervical back: Normal range of motion and neck supple.     Thoracic back: No tenderness or bony tenderness. Normal range of motion.  Skin:    General: Skin is warm and dry.     Findings: No lesion or rash.  Neurological:     General: No focal deficit present.     Mental Status: She is alert and oriented to person, place, and time.  Psychiatric:        Mood and Affect: Mood normal.        Behavior: Behavior normal.        Thought Content: Thought content normal.        Judgment: Judgment normal.       Assessment & Plan:   Problem List Items Addressed This Visit       Respiratory   IPF (idiopathic pulmonary fibrosis) ([place])   Other Visit Diagnoses     Acute bilateral thoracic back pain    -  Primary   Relevant Orders   DG Thoracic Spine 2 View        1. Acute bilateral  thoracic back pain -Seems to be MSK in nature. Likely some degenerative changes causing loss in height, probably some scoliosis as well. -Will have XRAY done to help determine further workup and plan from there. May need to see ortho at  some point.  2. IPF (idiopathic pulmonary fibrosis) ([place]) -She has not seen pulmonology in awhile, but says she has been doing well and not needing oxygen therapy all the time. -My review of her last chest CT from 04/15/2020 showed new mild right axillary adenopathy - she will call to schedule with Dr. Vaughan Browner to review this again and see if repeat chest CT is appropriate still.    Plan to f/up with me in 2-3 months to renew fasting labs.   This note was prepared with assistance of Systems analyst. Occasional wrong-word or sound-a-like substitutions may have occurred due to the inherent limitations of voice recognition software.  Time Spent: 39 minutes of total time was spent on the date of the encounter performing the following actions: chart review prior to seeing the patient, obtaining history, performing a medically necessary exam, counseling on the treatment plan, placing orders, and documenting in our EHR.    Artemisa Sladek M Aarish Rockers, PA-C

## 2022-01-18 ENCOUNTER — Other Ambulatory Visit (HOSPITAL_COMMUNITY): Payer: Self-pay

## 2022-01-18 NOTE — Telephone Encounter (Signed)
Pirfenidone shipped from [facility] yesterday, 01/17/22. Closing encounter.  Knox Saliva, PharmD, MPH, BCPS Clinical Pharmacist (Rheumatology and Pulmonology)

## 2022-01-30 ENCOUNTER — Ambulatory Visit: Payer: Medicare HMO | Admitting: Pulmonary Disease

## 2022-02-01 ENCOUNTER — Ambulatory Visit: Payer: Medicare HMO | Admitting: Pulmonary Disease

## 2022-02-01 ENCOUNTER — Other Ambulatory Visit (HOSPITAL_COMMUNITY): Payer: Self-pay

## 2022-02-01 ENCOUNTER — Telehealth: Payer: Self-pay

## 2022-02-01 ENCOUNTER — Encounter: Payer: Self-pay | Admitting: Pulmonary Disease

## 2022-02-01 ENCOUNTER — Other Ambulatory Visit: Payer: Self-pay

## 2022-02-01 VITALS — BP 128/78 | HR 85 | Temp 98.1°F | Ht 61.5 in | Wt 151.6 lb

## 2022-02-01 DIAGNOSIS — Z5181 Encounter for therapeutic drug level monitoring: Secondary | ICD-10-CM | POA: Diagnosis not present

## 2022-02-01 DIAGNOSIS — J84112 Idiopathic pulmonary fibrosis: Secondary | ICD-10-CM

## 2022-02-01 NOTE — Patient Instructions (Signed)
I am glad you are recovering from the [place] to exercise and stay active Continue Esbriet We will check a walk test today and check labs for comprehensive metabolic panel Order high-res CT and PFTs in 3 months Return to clinic in 3 months

## 2022-02-01 NOTE — Telephone Encounter (Signed)
Pt's insurance is requiring a new PA for 2023 as the pirfenidone capsules are non-formulary.  Submitted a Prior Authorization request to [facility] for PIRFENIDONE via CoverMyMeds. Will update once we receive a response.   Key: [id number]

## 2022-02-01 NOTE — Progress Notes (Signed)
Maria [name]    [id number]    04/13/1941  Primary Care Physician:Allwardt, Randa Evens, PA-C  Referring Physician: Allwardt, Randa Evens, PA-C [place],  [place] [address]  Chief complaint: Follow-up for  IPF Started Esbriet September 1810  HPI: 81 year old with history of hypertension, stroke, sleep apnea. Referred for evaluation of interstitial lung disease This was noted on CT scan done in 2018.  Notes increasing cough, dyspnea over the past few months.  Symptoms worsen after COVID-19 vaccination.  No mucus production, wheezing or chills.  She had an exposure to DDT as a child ( middle School) and had severe respiratory problems ever since, including multiple flu illnesses that would take her 6-8 weeks to recover from.  Finished pulmonary rehab in 2022 [place] continue [facility] at [place] for second opinion in 2022  Pets: No pets Occupation: Retired Quarry manager at [employer] Exposures: Reports exposure to DDD as a child and to acids in her lab at work but no known ongoing exposures.  No mold, hot tub, Jacuzzi.  No asbestos, down pillows or comforters Smoking history: 5-pack-year smoker.  Quit in 1971 Travel history: No significant travel history Relevant family history: Grandfather had emphysema.  He was a pipe smoker  Interim history: She developed COVID-19 in January 2023 and was treated with molnupiravir. Has recovered but continues to be fatigued.  She is to stay active with hiking and water aerobics but has cut down on these now Continues on Esbriet  Outpatient Encounter Medications as of 02/01/2022  Medication Sig   amLODipine (NORVASC) 5 MG tablet TAKE 1 TABLET EVERY DAY   b complex vitamins tablet Take 1 tablet by mouth daily.   CALCIUM PO Take by mouth daily.   Cholecalciferol (VITAMIN D-3 PO) Take by mouth daily.   clopidogrel (PLAVIX) 75 MG tablet TAKE 1 TABLET EVERY DAY   co-enzyme Q-10 30 MG capsule Take 30 mg by mouth 3  (three) times daily.   Collagen Hydrolysate POWD 1 Scoop by Does not apply route daily.   ferrous sulfate 324 MG TBEC Take 324 mg by mouth every other day.   hydrochlorothiazide (MICROZIDE) 12.5 MG capsule TAKE 1 CAPSULE EVERY DAY   levocetirizine (XYZAL) 5 MG tablet TAKE 1 TABLET EVERY EVENING   LUTEIN PO Take by mouth daily.   montelukast (SINGULAIR) 10 MG tablet TAKE 1 TABLET AT BEDTIME   Multiple Vitamins-Minerals (WOMENS MULTIVITAMIN PO) Take 1 capsule by mouth daily.   oxybutynin (DITROPAN-XL) 10 MG 24 hr tablet TAKE 1 TABLET BY MOUTH Q DAILY   pantoprazole (PROTONIX) 40 MG tablet Take 1 tablet (40 mg total) by mouth 2 (two) times daily.   Pirfenidone (ESBRIET) 267 MG CAPS TAKE 3 CAPSULES BY MOUTH WITH BREAKFAST, WITH LUNCH, AND WITH EVENING MEAL.   potassium chloride (KLOR-CON) 10 MEQ tablet TAKE 1 TABLET EVERY DAY   simvastatin (ZOCOR) 10 MG tablet TAKE 1 TABLET EVERY DAY  AT  6  PM   trimethoprim (TRIMPEX) 100 MG tablet Take 100 mg by mouth daily.    Turmeric 500 MG CAPS Take 1 capsule by mouth daily.   zinc gluconate 50 MG tablet Take 50 mg by mouth daily.   albuterol (PROAIR HFA) 108 (90 Base) MCG/ACT inhaler INHALE 2 PUFFS INTO THE LUNCH EVERY 6 HOURS AS NEEDED FOR WHEEZING OR SHORTNESS OF BREATH (Patient not taking: Reported on 02/01/2022)   No facility-administered encounter medications on file as of 02/01/2022.  Physical Exam: Blood pressure 128/78, pulse 85, temperature 98.1 F (36.7 C), temperature source Oral, height 5' 1.5" (1.562 m), weight 151 lb 9.6 oz (68.8 kg), SpO2 94 %. Gen:      No acute distress HEENT:  EOMI, sclera anicteric Neck:     No masses; no thyromegaly Lungs:    Clear to auscultation bilaterally; normal respiratory effort CV:         Regular rate and rhythm; no murmurs Abd:      + bowel sounds; soft, non-tender; no palpable masses, no distension Ext:    No edema; adequate peripheral perfusion Skin:      Warm and dry; no rash Neuro: alert and oriented  x 3 Psych: normal mood and affect   Data Reviewed: Imaging: High-resolution CT 03/29/2017-basal gradient fibrotic changes with reticulation, traction bronchiectasis.  Moderate air trapping.  Probable UIP pattern.  High-res CT 04/15/2020-mildly progressive pulmonary fibrosis probable UIP pattern. I have reviewed the images personally.  PFTs: 04/17/2017 FVC 1.69 [64%], FEV1 1.45 [73%], F/F 86, TLC 3.72 [76%], DLCO 14.90 [65%]  06/17/2020 FVC 1.72 [68%], FEV1 1.44 [76%], F/F 84, TLC 3.26 [66%], DLCO 14.53 [79%]  Moderate restriction with diffusion defect.  Slightly worsened compared to 2018  6-minute walk test 06/30/2020- 443 m, nadir O2 sat of 90%  Labs: RAST panel 04/27/2015-IgE 40, sensitive to grass, tree pollen CBC 02/18/2018-WBC 6.2, eos 3.9%, absolute eosinophil count 242  N-terminal proBNP 09/10/2019 1-24  ILD serologies 04/09/2020-atypical p-ANCA 1:80  Sleep: PSG 07/31/17, AHI 6.4, SpO2 low 85%. CPAP titration 11/15/2017 CPAP 7  Cardiac: Echocardiogram 09/02/2021-LVEF 60 to 95%, RV systolic function is normal.  Normal PA systolic pressure.  No evidence of shunt by bubble study  Assessment:  Pulmonary fibrosis Probable UIP pattern.  This is likely IPF with minimal exposures.  Serologies show mild elevation in ANCA which is likely nonspecific with no other signs and symptoms of autoimmune process.  Discussed findings in detail.  To make a diagnosis with certainty we would need a lung biopsy but not recommended given her age. Given mild progression on CT scan and PFTs with worsening symptoms we have decided to treat with antifibrotic Continue Esbriet Monitor LFTs  She is recovering from [place] and I advised her to resume exercise as tolerated Schedule high-res CT and PFTs in 3 months Check LFTs today  Mild pulmonary hypertension Noted on echocardiogram in 2021 however repeat study did not show any evidence of pulmonary hypertension.  Continue monitoring.  Chronic  cough Secondary to pulmonary fibrosis, GERD Continue PPI, Tessalon Advised to raise head of the bed and not lay down at least 2 hours after eating  OSA Stable Download reviewed with good compliance  Plan/Recommendations: - Continue Esbriet, check LFTs - Walk study - High-res CT, PFTs  Marshell Garfinkel MD [facility] 02/01/2022, 11:33 AM  CC: Allwardt, Randa Evens, PA-C

## 2022-02-01 NOTE — Telephone Encounter (Signed)
Received notification from [facility] regarding a prior authorization for PIRFENIDONE. Authorization has been APPROVED from 12/18/2021 to 12/17/2022.    Authorization # [id number]

## 2022-02-02 LAB — COMPREHENSIVE METABOLIC PANEL
ALT: 11 U/L (ref 0–35)
AST: 20 U/L (ref 0–37)
Albumin: 4.4 g/dL (ref 3.5–5.2)
Alkaline Phosphatase: 81 U/L (ref 39–117)
BUN: 14 mg/dL (ref 6–23)
CO2: 31 mEq/L (ref 19–32)
Calcium: 9.9 mg/dL (ref 8.4–10.5)
Chloride: 96 mEq/L (ref 96–112)
Creatinine, Ser: 0.64 mg/dL (ref 0.40–1.20)
GFR: 82.99 mL/min (ref >60.00)
Glucose, Bld: 90 mg/dL (ref 70–99)
Potassium: 4.2 mEq/L (ref 3.5–5.1)
Sodium: 132 mEq/L — ABNORMAL LOW (ref 135–145)
Total Bilirubin: 0.6 mg/dL (ref 0.2–1.2)
Total Protein: 7.9 g/dL (ref 6.0–8.3)

## 2022-02-06 ENCOUNTER — Ambulatory Visit (INDEPENDENT_AMBULATORY_CARE_PROVIDER_SITE_OTHER): Payer: Medicare HMO

## 2022-02-06 ENCOUNTER — Other Ambulatory Visit: Payer: Self-pay

## 2022-02-06 ENCOUNTER — Other Ambulatory Visit: Payer: Medicare HMO

## 2022-02-06 DIAGNOSIS — M546 Pain in thoracic spine: Secondary | ICD-10-CM | POA: Diagnosis not present

## 2022-02-07 ENCOUNTER — Other Ambulatory Visit (HOSPITAL_COMMUNITY): Payer: Self-pay

## 2022-02-07 ENCOUNTER — Other Ambulatory Visit: Payer: Self-pay

## 2022-02-08 ENCOUNTER — Other Ambulatory Visit: Payer: Self-pay

## 2022-02-08 DIAGNOSIS — M546 Pain in thoracic spine: Secondary | ICD-10-CM

## 2022-02-08 NOTE — Progress Notes (Unsigned)
This encounter was created in error - please disregard.

## 2022-02-10 ENCOUNTER — Other Ambulatory Visit (HOSPITAL_COMMUNITY): Payer: Self-pay

## 2022-02-13 ENCOUNTER — Other Ambulatory Visit (HOSPITAL_COMMUNITY): Payer: Self-pay

## 2022-02-20 ENCOUNTER — Other Ambulatory Visit (HOSPITAL_COMMUNITY): Payer: Self-pay

## 2022-03-10 ENCOUNTER — Other Ambulatory Visit (HOSPITAL_COMMUNITY): Payer: Self-pay

## 2022-03-20 ENCOUNTER — Other Ambulatory Visit (HOSPITAL_COMMUNITY): Payer: Self-pay

## 2022-03-22 ENCOUNTER — Other Ambulatory Visit (HOSPITAL_COMMUNITY): Payer: Self-pay

## 2022-03-23 ENCOUNTER — Other Ambulatory Visit (HOSPITAL_COMMUNITY): Payer: Self-pay

## 2022-03-28 ENCOUNTER — Other Ambulatory Visit: Payer: Self-pay | Admitting: Physician Assistant

## 2022-03-28 ENCOUNTER — Other Ambulatory Visit (HOSPITAL_COMMUNITY): Payer: Self-pay

## 2022-03-28 DIAGNOSIS — Z1231 Encounter for screening mammogram for malignant neoplasm of breast: Secondary | ICD-10-CM

## 2022-03-29 ENCOUNTER — Other Ambulatory Visit (HOSPITAL_COMMUNITY): Payer: Self-pay

## 2022-04-12 ENCOUNTER — Ambulatory Visit: Payer: Medicare HMO | Admitting: Physician Assistant

## 2022-04-17 ENCOUNTER — Ambulatory Visit (INDEPENDENT_AMBULATORY_CARE_PROVIDER_SITE_OTHER): Payer: Medicare HMO

## 2022-04-17 ENCOUNTER — Other Ambulatory Visit: Payer: Self-pay | Admitting: Internal Medicine

## 2022-04-17 DIAGNOSIS — Z0000 Encounter for general adult medical examination without abnormal findings: Secondary | ICD-10-CM | POA: Diagnosis not present

## 2022-04-17 NOTE — Patient Instructions (Signed)
Maria [name] , ?Thank you for taking time to come for your Medicare Wellness Visit. I appreciate your ongoing commitment to your health goals. Please review the following plan we discussed and let me know if I can assist you in the future.  ? ?Screening recommendations/referrals: ?Colonoscopy: No longer required  ?Mammogram: Scheduled for 04/20/22 ?Bone Density: Done 02/07/17 repeat every 2 years  ?Recommended yearly ophthalmology/optometry visit for glaucoma screening and checkup ?Recommended yearly dental visit for hygiene and checkup ? ?Vaccinations: ?Influenza vaccine: declined  ?Pneumococcal vaccine: Up to date ?Tdap vaccine: Done 11/10/14 repeat every 10 years  ?Shingles vaccine: 1st dose 09/08/19  not a candidate   ?Covid-19:Completed 2/26, 03/09/20  ? ?Advanced directives: Please bring a copy of your health care power of attorney and living will to the office at your convenience. ? ?Conditions/risks identified: None at this time  ? ?Next appointment: Follow up in one year for your annual wellness visit  ? ? ?Preventive Care 42 Years and Older, Female ?Preventive care refers to lifestyle choices and visits with your health care provider that can promote health and wellness. ?What does preventive care include? ?A yearly physical exam. This is also called an annual well check. ?Dental exams once or twice a year. ?Routine eye exams. Ask your health care provider how often you should have your eyes checked. ?Personal lifestyle choices, including: ?Daily care of your teeth and gums. ?Regular physical activity. ?Eating a healthy diet. ?Avoiding tobacco and drug use. ?Limiting alcohol use. ?Practicing safe sex. ?Taking low-dose aspirin every day. ?Taking vitamin and mineral supplements as recommended by your health care provider. ?What happens during an annual well check? ?The services and screenings done by your health care provider during your annual well check will depend on your age, overall health, lifestyle risk  factors, and family history of disease. ?Counseling  ?Your health care provider may ask you questions about your: ?Alcohol use. ?Tobacco use. ?Drug use. ?Emotional well-being. ?Home and relationship well-being. ?Sexual activity. ?Eating habits. ?History of falls. ?Memory and ability to understand (cognition). ?Work and work Statistician. ?Reproductive health. ?Screening  ?You may have the following tests or measurements: ?Height, weight, and BMI. ?Blood pressure. ?Lipid and cholesterol levels. These may be checked every 5 years, or more frequently if you are over 53 years old. ?Skin check. ?Lung cancer screening. You may have this screening every year starting at age 25 if you have a 30-pack-year history of smoking and currently smoke or have quit within the past 15 years. ?Fecal occult blood test (FOBT) of the stool. You may have this test every year starting at age 51. ?Flexible sigmoidoscopy or colonoscopy. You may have a sigmoidoscopy every 5 years or a colonoscopy every 10 years starting at age 52. ?Hepatitis C blood test. ?Hepatitis B blood test. ?Sexually transmitted disease (STD) testing. ?Diabetes screening. This is done by checking your blood sugar (glucose) after you have not eaten for a while (fasting). You may have this done every 1-3 years. ?Bone density scan. This is done to screen for osteoporosis. You may have this done starting at age 100. ?Mammogram. This may be done every 1-2 years. Talk to your health care provider about how often you should have regular mammograms. ?Talk with your health care provider about your test results, treatment options, and if necessary, the need for more tests. ?Vaccines  ?Your health care provider may recommend certain vaccines, such as: ?Influenza vaccine. This is recommended every year. ?Tetanus, diphtheria, and acellular pertussis (Tdap, Td) vaccine. You  may need a Td booster every 10 years. ?Zoster vaccine. You may need this after age 20. ?Pneumococcal 13-valent  conjugate (PCV13) vaccine. One dose is recommended after age 55. ?Pneumococcal polysaccharide (PPSV23) vaccine. One dose is recommended after age 62. ?Talk to your health care provider about which screenings and vaccines you need and how often you need them. ?This information is not intended to replace advice given to you by your health care provider. Make sure you discuss any questions you have with your health care provider. ?Document Released: 12/31/2015 Document Revised: 08/23/2016 Document Reviewed: 10/05/2015 ?Elsevier Interactive Patient Education ? 2017 [employer]. ? ?Fall Prevention in the Home ?Falls can cause injuries. They can happen to people of all ages. There are many things you can do to make your home safe and to help prevent falls. ?What can I do on the outside of my home? ?Regularly fix the edges of walkways and driveways and fix any cracks. ?Remove anything that might make you trip as you walk through a door, such as a raised step or threshold. ?Trim any bushes or trees on the path to your home. ?Use bright outdoor lighting. ?Clear any walking paths of anything that might make someone trip, such as rocks or tools. ?Regularly check to see if handrails are loose or broken. Make sure that both sides of any steps have handrails. ?Any raised decks and porches should have guardrails on the edges. ?Have any leaves, snow, or ice cleared regularly. ?Use sand or salt on walking paths during winter. ?Clean up any spills in your garage right away. This includes oil or grease spills. ?What can I do in the bathroom? ?Use night lights. ?Install grab bars by the toilet and in the tub and shower. Do not use towel bars as grab bars. ?Use non-skid mats or decals in the tub or shower. ?If you need to sit down in the shower, use a plastic, non-slip stool. ?Keep the floor dry. Clean up any water that spills on the floor as soon as it happens. ?Remove soap buildup in the tub or shower regularly. ?Attach bath mats  securely with double-sided non-slip rug tape. ?Do not have throw rugs and other things on the floor that can make you trip. ?What can I do in the bedroom? ?Use night lights. ?Make sure that you have a light by your bed that is easy to reach. ?Do not use any sheets or blankets that are too big for your bed. They should not hang down onto the floor. ?Have a firm chair that has side arms. You can use this for support while you get dressed. ?Do not have throw rugs and other things on the floor that can make you trip. ?What can I do in the kitchen? ?Clean up any spills right away. ?Avoid walking on wet floors. ?Keep items that you use a lot in easy-to-reach places. ?If you need to reach something above you, use a strong step stool that has a grab bar. ?Keep electrical cords out of the way. ?Do not use floor polish or wax that makes floors slippery. If you must use wax, use non-skid floor wax. ?Do not have throw rugs and other things on the floor that can make you trip. ?What can I do with my stairs? ?Do not leave any items on the stairs. ?Make sure that there are handrails on both sides of the stairs and use them. Fix handrails that are broken or loose. Make sure that handrails are as long as the  stairways. ?Check any carpeting to make sure that it is firmly attached to the stairs. Fix any carpet that is loose or worn. ?Avoid having throw rugs at the top or bottom of the stairs. If you do have throw rugs, attach them to the floor with carpet tape. ?Make sure that you have a light switch at the top of the stairs and the bottom of the stairs. If you do not have them, ask someone to add them for you. ?What else can I do to help prevent falls? ?Wear shoes that: ?Do not have high heels. ?Have rubber bottoms. ?Are comfortable and fit you well. ?Are closed at the toe. Do not wear sandals. ?If you use a stepladder: ?Make sure that it is fully opened. Do not climb a closed stepladder. ?Make sure that both sides of the stepladder  are locked into place. ?Ask someone to hold it for you, if possible. ?Clearly mark and make sure that you can see: ?Any grab bars or handrails. ?First and last steps. ?Where the edge of each step is. ?Use tools

## 2022-04-17 NOTE — Progress Notes (Addendum)
Virtual Visit via Telephone Note  I connected with  Maria [name] on 04/17/22 at  8:00 AM EDT by telephone and verified that I am speaking with the correct person using two identifiers.  Medicare Annual Wellness visit completed telephonically due to Covid-19 pandemic.   Persons participating in this call: This Health Coach and this patient.   Location: Patient: Home Provider: Office   I discussed the limitations, risks, security and privacy concerns of performing an evaluation and management service by telephone and the availability of in person appointments. The patient expressed understanding and agreed to proceed.  Unable to perform video visit due to video visit attempted and failed and/or patient does not have video capability.   Some vital signs may be absent or patient reported.   Marzella Schlein, LPN   Subjective:   Maria [name] is a 81 y.o. female who presents for Medicare Annual (Subsequent) preventive examination.  Review of Systems     Cardiac Risk Factors include: advanced age (>6men, >76 women);hypertension;dyslipidemia     Objective:    There were no vitals filed for this visit. There is no height or weight on file to calculate BMI.     04/17/2022    8:17 AM 08/25/2020   12:19 PM 08/08/2019   12:08 PM 05/30/2018    3:22 PM 11/16/2017   12:24 AM 07/31/2017    8:04 PM 05/29/2017    5:20 PM  Advanced Directives  Does Patient Have a Medical Advance Directive? Yes Yes Yes Yes No Yes Yes  Type of Estate agent of [employer] Power of [place];Living will Healthcare Power of [place];Living will  Healthcare Power of [place];Living will Healthcare Power of [place];Living will  Does patient want to make changes to medical advance directive?  No - Patient declined    No - Patient declined   Copy of Healthcare Power of Attorney in Chart? No - copy requested  No - copy requested No - copy requested  No - copy requested No - copy  requested  Would patient like information on creating a medical advance directive?     No - Patient declined      Current Medications (verified) Outpatient Encounter Medications as of 04/17/2022  Medication Sig   amLODipine (NORVASC) 5 MG tablet TAKE 1 TABLET EVERY DAY   b complex vitamins tablet Take 1 tablet by mouth daily.   CALCIUM PO Take by mouth daily.   clopidogrel (PLAVIX) 75 MG tablet TAKE 1 TABLET EVERY DAY   co-enzyme Q-10 30 MG capsule Take 30 mg by mouth 3 (three) times daily.   Collagen Hydrolysate POWD 1 Scoop by Does not apply route daily.   ferrous sulfate 324 MG TBEC Take 324 mg by mouth every other day.   hydrochlorothiazide (MICROZIDE) 12.5 MG capsule TAKE 1 CAPSULE EVERY DAY   levocetirizine (XYZAL) 5 MG tablet TAKE 1 TABLET EVERY EVENING   LUTEIN PO Take by mouth daily.   montelukast (SINGULAIR) 10 MG tablet TAKE 1 TABLET AT BEDTIME   Multiple Vitamins-Minerals (WOMENS MULTIVITAMIN PO) Take 1 capsule by mouth daily.   pantoprazole (PROTONIX) 40 MG tablet Take 1 tablet (40 mg total) by mouth 2 (two) times daily.   Pirfenidone (ESBRIET) 267 MG CAPS TAKE 3 CAPSULES BY MOUTH WITH BREAKFAST, WITH LUNCH, AND WITH EVENING MEAL.   potassium chloride (KLOR-CON) 10 MEQ tablet TAKE 1 TABLET EVERY DAY   simvastatin (ZOCOR) 10 MG tablet TAKE 1 TABLET EVERY DAY  AT  6  PM   trimethoprim (TRIMPEX) 100 MG tablet Take 100 mg by mouth daily.    Turmeric 500 MG CAPS Take 1 capsule by mouth daily.   zinc gluconate 50 MG tablet Take 50 mg by mouth daily.   Cholecalciferol (VITAMIN D-3 PO) Take by mouth daily.   oxybutynin (DITROPAN-XL) 10 MG 24 hr tablet TAKE 1 TABLET BY MOUTH Q DAILY   [DISCONTINUED] albuterol (PROAIR HFA) 108 (90 Base) MCG/ACT inhaler INHALE 2 PUFFS INTO THE LUNCH EVERY 6 HOURS AS NEEDED FOR WHEEZING OR SHORTNESS OF BREATH (Patient not taking: Reported on 02/01/2022)   [DISCONTINUED] TRIMETHOBENZAMIDE HCL PO trimethobenzamide   No facility-administered encounter  medications on file as of 04/17/2022.    Allergies (verified) Amoxicillin, Olmesartan, Ace inhibitors, Bee venom, Benicar [olmesartan medoxomil], Codeine, Covid-19 (mrna) vaccine, Lisinopril, Macrobid [nitrofurantoin monohyd macro], Nutritional supplements, Penicillins, and Shingrix [zoster vac recomb adjuvanted]   History: Past Medical History:  Diagnosis Date   Anemia    Arthritis    Bronchitis    Carpal tunnel syndrome    in calves neuropathy   Diverticulosis    GERD (gastroesophageal reflux disease)    Headache(784.0)    chronic   High cholesterol    Hyperlipidemia    Hypertension    Migraine    Nephritis    OSA (obstructive sleep apnea)    Palpitations    Personal history of colonic adenomas 09/15/2010   Stroke (HCC)    TIA (transient ischemic attack)    Vertigo, benign paroxysmal    Past Surgical History:  Procedure Laterality Date   BLADDER REPAIR     BREAST BIOPSY     rt benign cyst   CARPAL TUNNEL RELEASE     x 2   COLONOSCOPY     REPLACEMENT TOTAL KNEE BILATERAL     TONSILLECTOMY     UPPER GASTROINTESTINAL ENDOSCOPY     Family History  Problem Relation Age of Onset   Diabetes Father    Aneurysm Father    Kidney disease Father    Hypertension Father    Heart disease Mother    Colon polyps Mother    Chronic bronchitis Mother    Diabetes Brother    Colon cancer Maternal Grandmother    Irritable bowel syndrome Daughter    Emphysema Maternal Grandfather        smoked a pipe   Social History   Socioeconomic History   Marital status: Divorced    Spouse name: Not on file   Number of children: 3   Years of education: Not on file   Highest education level: Not on file  Occupational History   Occupation: retired  Tobacco Use   Smoking status: Former    Packs/day: 1.00    Years: 10.00    Pack years: 10.00    Types: Cigarettes    Quit date: 12/18/1969    Years since quitting: 52.3    Passive exposure: Past   Smokeless tobacco: Never  Vaping Use    Vaping Use: Never used  Substance and Sexual Activity   Alcohol use: Yes    Comment: ocassional use   Drug use: No   Sexual activity: Not Currently  Other Topics Concern   Not on file  Social History Narrative   Retired, divorced 3 kids   Former smoker   Occ EtOH   No drugs   Social Determinants of Corporate investment banker Strain: Low Risk    Difficulty of Paying Living Expenses: Not hard at all  Food Insecurity: No Food Insecurity   Worried About Programme researcher, broadcasting/film/video in the Last Year: Never true   Ran Out of Food in the Last Year: Never true  Transportation Needs: No Transportation Needs   Lack of Transportation (Medical): No   Lack of Transportation (Non-Medical): No  Physical Activity: Sufficiently Active   Days of Exercise per Week: 4 days   Minutes of Exercise per Session: 60 min  Stress: No Stress Concern Present   Feeling of Stress : Only a little  Social Connections: Moderately Integrated   Frequency of Communication with Friends and Family: More than three times a week   Frequency of Social Gatherings with Friends and Family: More than three times a week   Attends Religious Services: More than 4 times per year   Active Member of [employer] or Organizations: Yes   Attends Banker Meetings: 1 to 4 times per year   Marital Status: Divorced    Tobacco Counseling Counseling given: Not Answered   Clinical Intake:  Pre-visit preparation completed: Yes  Pain : No/denies pain     BMI - recorded: 28.18 Nutritional Status: BMI 25 -29 Overweight Nutritional Risks: None Diabetes: No  How often do you need to have someone help you when you read instructions, pamphlets, or other written materials from your doctor or pharmacy?: 1 - Never  Diabetic?no  Interpreter Needed?: No  Information entered by :: Lanier Ensign, LPN   Activities of Daily Living    04/17/2022    8:18 AM  In your present state of health, do you have any difficulty performing the  following activities:  Hearing? 0  Vision? 0  Difficulty concentrating or making decisions? 0  Walking or climbing stairs? 0  Dressing or bathing? 0  Doing errands, shopping? 0  Preparing Food and eating ? N  Using the Toilet? N  In the past six months, have you accidently leaked urine? Y  Comment wears a pad  Do you have problems with loss of bowel control? N  Managing your Medications? N  Managing your Finances? N  Housekeeping or managing your Housekeeping? N    Patient Care Team: Allwardt, Crist Infante, PA-C as PCP - General (Physician Assistant) Waldon Reining, MD (Orthopedic Surgery) Alfredo Martinez, MD as Consulting Physician (Urology)  Indicate any recent Medical Services you may have received from other than [facility] providers in the past year (date may be approximate).     Assessment:   This is a routine wellness examination for Maria [name].  Hearing/Vision screen Hearing Screening - Comments:: Pt denies any hearing  Vision Screening - Comments:: Pt [facility] 2022  Dietary issues and exercise activities discussed: Current Exercise Habits: Home exercise routine, Type of exercise: Other - see comments, Time (Minutes): 60, Frequency (Times/Week): 4, Weekly Exercise (Minutes/Week): 240   Goals Addressed             This Visit's Progress    Patient Stated       None at this  time        Depression Screen    04/17/2022    8:15 AM 04/21/2021   11:12 AM 02/18/2021    9:38 AM 08/25/2020   12:17 PM 08/05/2020    1:32 PM 01/23/2020    2:21 PM 08/08/2019   12:08 PM  PHQ 2/9 Scores  PHQ - 2 Score 0 0 0 0 0 1 0  PHQ- 9 Score  0  Fall Risk    04/17/2022    8:18 AM 01/17/2022   11:09 AM 08/05/2020    1:32 PM 01/23/2020    2:21 PM 08/08/2019   12:08 PM  Fall Risk   Falls in the past year? 0 0 0 0 0  Number falls in past yr: 0 0 0  0  Injury with Fall? 0 0 0    Risk for fall due to : Impaired vision  No Fall Risks    Follow up Falls  prevention discussed  Falls evaluation completed      FALL RISK PREVENTION PERTAINING TO THE HOME:  Any stairs in or around the home? No  If so, are there any without handrails? No  Home free of loose throw rugs in walkways, pet beds, electrical cords, etc? Yes  Adequate lighting in your home to reduce risk of falls? Yes   ASSISTIVE DEVICES UTILIZED TO PREVENT FALLS:  Life alert? No  Use of a cane, walker or w/c? No  Grab bars in the bathroom? No  Shower chair or bench in shower? No  Elevated toilet seat or a handicapped toilet? No   TIMED UP AND GO:  Was the test performed? No .   Cognitive Function:        04/17/2022    8:19 AM 08/08/2019   12:12 PM  6CIT Screen  What Year? 0 points 0 points  What month? 0 points 0 points  What time? 0 points 0 points  Count back from 20 0 points 0 points  Months in reverse 0 points 0 points  Repeat phrase 0 points 0 points  Total Score 0 points 0 points    Immunizations Immunization History  Administered Date(s) Administered   Influenza Split 10/22/2012, 08/25/2013   Influenza Whole 08/18/2010   Influenza, High Dose Seasonal PF 12/16/2015, 12/01/2016, 10/30/2017, 10/29/2018   Influenza, Quadrivalent, Recombinant, Inj, Pf 08/23/2019   Influenza,inj,Quad PF,6+ Mos 01/05/2015   Influenza-Unspecified 10/18/2018   PFIZER(Purple Top)SARS-COV-2 Vaccination 02/13/2020, 03/09/2020   Pneumococcal Conjugate-13 10/23/2013   Pneumococcal Polysaccharide-23 08/18/2008   Td 11/10/2014   Zoster Recombinat (Shingrix) 09/08/2019    TDAP status: Up to date  Flu Vaccine status: Declined, Education has been provided regarding the importance of this vaccine but patient still declined. Advised may receive this vaccine at local pharmacy or Health Dept. Aware to provide a copy of the vaccination record if obtained from local pharmacy or Health Dept. Verbalized acceptance and understanding.  Pneumococcal vaccine status: Up to date  Covid-19 vaccine  status: Completed vaccines  Qualifies for Shingles Vaccine? Yes   Zostavax completed Yes   Shingrix Completed?: No.    Education has been provided regarding the importance of this vaccine. Patient has been advised to call insurance company to determine out of pocket expense if they have not yet received this vaccine. Advised may also receive vaccine at local pharmacy or Health Dept. Verbalized acceptance and understanding.  Screening Tests Health Maintenance  Topic Date Due   Zoster Vaccines- Shingrix (2 of 2) 11/03/2019   COVID-19 Vaccine (3 - Booster for Pfizer series) 05/04/2020   INFLUENZA VACCINE  07/18/2022   TETANUS/TDAP  11/10/2024   Pneumonia Vaccine 27+ Years old  Completed   DEXA SCAN  Completed   HPV VACCINES  Aged Out   COLONOSCOPY (Pts 45-93yrs Insurance coverage will need to be confirmed)  Discontinued    Health Maintenance  Health Maintenance Due  Topic Date Due   Zoster Vaccines- Shingrix (2 of 2) 11/03/2019  COVID-19 Vaccine (3 - Booster for Pfizer series) 05/04/2020    Colorectal cancer screening: No longer required.   Mammogram scheduled for 04/20/22   Bone Density status: Completed 02/07/17. Results reflect: Bone density results: OSTEOPENIA. Repeat every 2 years.  Additional Screening:  Vision Screening: Recommended annual ophthalmology exams for early detection of glaucoma and other disorders of the eye. Is the patient up to date with their annual eye exam?  No  Who is the provider or what is the name of the office in which the patient attends annual eye exams? Last appt with [facility]  If pt is not established with a provider, would they like to be referred to a provider to establish care? No .   Dental Screening: Recommended annual dental exams for proper oral hygiene  Community Resource Referral / Chronic Care Management: CRR required this visit?  No   CCM required this visit?  No      Plan:     I have personally reviewed and noted  the following in the patient's chart:   Medical and social history Use of alcohol, tobacco or illicit drugs  Current medications and supplements including opioid prescriptions.  Functional ability and status Nutritional status Physical activity Advanced directives List of other physicians Hospitalizations, surgeries, and ER visits in previous 12 months Vitals Screenings to include cognitive, depression, and falls Referrals and appointments  In addition, I have reviewed and discussed with patient certain preventive protocols, quality metrics, and best practice recommendations. A written personalized care plan for preventive services as well as general preventive health recommendations were provided to patient.     Marzella Schlein, LPN   05/22/7845   Nurse Notes: None

## 2022-04-18 ENCOUNTER — Other Ambulatory Visit (HOSPITAL_COMMUNITY): Payer: Self-pay

## 2022-04-18 NOTE — Telephone Encounter (Signed)
To new PCP ?

## 2022-04-19 ENCOUNTER — Ambulatory Visit (INDEPENDENT_AMBULATORY_CARE_PROVIDER_SITE_OTHER): Payer: Medicare HMO | Admitting: Physician Assistant

## 2022-04-19 ENCOUNTER — Ambulatory Visit: Payer: Medicare HMO | Admitting: Physician Assistant

## 2022-04-19 ENCOUNTER — Encounter: Payer: Self-pay | Admitting: Physician Assistant

## 2022-04-19 VITALS — BP 140/86 | HR 82 | Temp 98.2°F | Ht 61.5 in | Wt 154.6 lb

## 2022-04-19 DIAGNOSIS — Z87898 Personal history of other specified conditions: Secondary | ICD-10-CM | POA: Diagnosis not present

## 2022-04-19 DIAGNOSIS — E782 Mixed hyperlipidemia: Secondary | ICD-10-CM | POA: Diagnosis not present

## 2022-04-19 DIAGNOSIS — R1319 Other dysphagia: Secondary | ICD-10-CM

## 2022-04-19 DIAGNOSIS — R202 Paresthesia of skin: Secondary | ICD-10-CM

## 2022-04-19 DIAGNOSIS — I10 Essential (primary) hypertension: Secondary | ICD-10-CM

## 2022-04-19 DIAGNOSIS — J84112 Idiopathic pulmonary fibrosis: Secondary | ICD-10-CM | POA: Diagnosis not present

## 2022-04-19 DIAGNOSIS — N390 Urinary tract infection, site not specified: Secondary | ICD-10-CM | POA: Diagnosis not present

## 2022-04-19 DIAGNOSIS — K219 Gastro-esophageal reflux disease without esophagitis: Secondary | ICD-10-CM

## 2022-04-19 DIAGNOSIS — G459 Transient cerebral ischemic attack, unspecified: Secondary | ICD-10-CM | POA: Diagnosis not present

## 2022-04-19 NOTE — Progress Notes (Signed)
? ?Subjective:  ? ? Patient ID: Maria [name], female    DOB: [birth year], 81 y.o.   MRN: [id number] ? ?Chief Complaint  ?Patient presents with  ? Hypertension  ?  Pt is not fasting; pt states she is here for annual physical, needs labs per pt and refills on all medication since we are taking over her care.   ?   ? ? ?HPI ?Patient is in today for follow-up on blood pressure. ? ?HTN: ?140s-150s/80s-90s at home - not c/w checking right now ?Amlodipine 5 mg - patient states this was cut in half from 10 mg at her last practice ?HCTZ 12.5 mg ?Exercises daily ?Family stress at home ? ?Craving sweets at home; says since being on Esbriet - has to eat more often, usually more carbs; otherwise feels nauseated.  ? ?Also mentions -  ?Sometimes having tingling in fingertips and toes. ?Sometimes dizzy in the mornings; eating later in the evenings ?No headaches. ?No CP. SOB secondary to IPF, no current changes.  ? ? ?Past Medical History:  ?Diagnosis Date  ? Anemia   ? Arthritis   ? Bronchitis   ? Carpal tunnel syndrome   ? in calves neuropathy  ? Diverticulosis   ? GERD (gastroesophageal reflux disease)   ? Headache(784.0)   ? chronic  ? High cholesterol   ? Hyperlipidemia   ? Hypertension   ? Migraine   ? Nephritis   ? OSA (obstructive sleep apnea)   ? Palpitations   ? Personal history of colonic adenomas 09/15/2010  ? Stroke [facility])   ? TIA (transient ischemic attack)   ? Vertigo, benign paroxysmal   ? ? ?Past Surgical History:  ?Procedure Laterality Date  ? BLADDER REPAIR    ? BREAST BIOPSY    ? rt benign cyst  ? BREAST EXCISIONAL BIOPSY Right   ? CARPAL TUNNEL RELEASE    ? x 2  ? COLONOSCOPY    ? REPLACEMENT TOTAL KNEE BILATERAL    ? TONSILLECTOMY    ? UPPER GASTROINTESTINAL ENDOSCOPY    ? ? ?Family History  ?Problem Relation Age of Onset  ? Diabetes Father   ? Aneurysm Father   ? Kidney disease Father   ? Hypertension Father   ? Heart disease Mother   ? Colon polyps Mother   ? Chronic bronchitis Mother   ? Diabetes Brother    ? Colon cancer Maternal Grandmother   ? Irritable bowel syndrome Daughter   ? Emphysema Maternal Grandfather   ?     smoked a pipe  ? ? ?Social History  ? ?Tobacco Use  ? Smoking status: Former  ?  Packs/day: 1.00  ?  Years: 10.00  ?  Pack years: 10.00  ?  Types: Cigarettes  ?  Quit date: 12/18/1969  ?  Years since quitting: 52.3  ?  Passive exposure: Past  ? Smokeless tobacco: Never  ?Vaping Use  ? Vaping Use: Never used  ?Substance Use Topics  ? Alcohol use: Yes  ?  Comment: ocassional use  ? Drug use: No  ?  ? ?Allergies  ?Allergen Reactions  ? Amoxicillin Hives  ? Olmesartan Hives  ? Ace Inhibitors Other (See Comments)  ?  unknown  ? Bee Venom   ? Benicar [Olmesartan Medoxomil] Other (See Comments)  ?  Tongue swelling  ? Codeine   ?  REACTION: nausea  ? Covid-19 (Mrna) Vaccine   ?  Increased shortness of Breath in Fibrosis  ? Lisinopril Other (See Comments)  ?  unknown  ? Macrobid [employer] Macro]   ?  Pulmonary Fibrosis  ? Nutritional Supplements   ?  Bee stings cause angioedema-carry an EPIPEN  ? Penicillins   ? Shingrix [Zoster Vac Recomb Adjuvanted] Other (See Comments)  ?  Chills, feverish, nausea x 4 days  ? ? ?Review of Systems ?NEGATIVE UNLESS OTHERWISE INDICATED IN HPI ? ? ?   ?Objective:  ?  ? ?BP 140/86 (BP Location: Left Arm)   Pulse 82   Temp 98.2 ?F (36.8 ?C) (Temporal)   Ht 5' 1.5" (1.562 m)   Wt 154 lb 9.6 oz (70.1 kg)   SpO2 95%   BMI 28.74 kg/m?  ? ?Wt Readings from Last 3 Encounters:  ?04/19/22 154 lb 9.6 oz (70.1 kg)  ?02/01/22 151 lb 9.6 oz (68.8 kg)  ?01/17/22 150 lb 6.1 oz (68.2 kg)  ? ? ?BP Readings from Last 3 Encounters:  ?04/19/22 140/86  ?02/01/22 128/78  ?01/17/22 114/73  ?  ? ?Physical Exam ?Vitals and nursing note reviewed.  ?Constitutional:   ?   Appearance: Normal appearance. She is normal weight. She is not toxic-appearing.  ?HENT:  ?   Head: Normocephalic and atraumatic.  ?   Right Ear: Ear canal and external ear normal.  ?   Left Ear: Ear canal and external  ear normal.  ?   Nose: Nose normal.  ?   Mouth/Throat:  ?   Mouth: Mucous membranes are moist.  ?Eyes:  ?   Extraocular Movements: Extraocular movements intact.  ?   Conjunctiva/sclera: Conjunctivae normal.  ?   Pupils: Pupils are equal, round, and reactive to light.  ?Cardiovascular:  ?   Rate and Rhythm: Normal rate and regular rhythm.  ?   Pulses: Normal pulses.  ?   Heart sounds: Normal heart sounds.  ?Pulmonary:  ?   Effort: Pulmonary effort is normal.  ?   Breath sounds: Normal breath sounds.  ?Abdominal:  ?   General: Abdomen is flat. Bowel sounds are normal.  ?   Palpations: Abdomen is soft.  ?Musculoskeletal:     ?   General: No swelling or deformity. Normal range of motion.  ?   Cervical back: Normal range of motion and neck supple.  ?   Thoracic back: No tenderness or bony tenderness. Normal range of motion.  ?Skin: ?   General: Skin is warm and dry.  ?   Findings: No lesion or rash.  ?Neurological:  ?   General: No focal deficit present.  ?   Mental Status: She is alert and oriented to person, place, and time.  ?Psychiatric:     ?   Mood and Affect: Mood normal.     ?   Behavior: Behavior normal.     ?   Thought Content: Thought content normal.     ?   Judgment: Judgment normal.  ? ? ?   ?Assessment & Plan:  ? ?Problem List Items Addressed This Visit   ? ?  ? Cardiovascular and Mediastinum  ? TIA (transient ischemic attack)  ? Essential hypertension - Primary  ? Relevant Orders  ? CBC with Differential/Platelet  ? Comprehensive metabolic panel  ?  ? Respiratory  ? IPF (idiopathic pulmonary fibrosis) ([place])  ? Relevant Medications  ? trimethoprim (TRIMPEX) 100 MG tablet  ?  ? Digestive  ? GERD (gastroesophageal reflux disease)  ? Relevant Medications  ? pantoprazole (PROTONIX) 40 MG tablet  ?  ? Genitourinary  ? Recurrent UTI  ?  Relevant Medications  ? trimethoprim (TRIMPEX) 100 MG tablet  ? Other Relevant Orders  ? CBC with Differential/Platelet  ?  ? Other  ? Paresthesias  ? Relevant Orders  ? Vitamin  B12  ? Hyperlipidemia  ? Relevant Orders  ? Lipid panel  ? ?Other Visit Diagnoses   ? ? History of prediabetes      ? Relevant Orders  ? Comprehensive metabolic panel  ? Hemoglobin A1c  ? Esophageal dysphagia      ? Relevant Medications  ? pantoprazole (PROTONIX) 40 MG tablet  ? ?  ? ? ? ?Meds ordered this encounter  ?Medications  ? pantoprazole (PROTONIX) 40 MG tablet  ?  Sig: Take 1 tablet (40 mg total) by mouth 2 (two) times daily.  ?  Dispense:  180 tablet  ?  Refill:  2  ?  Order Specific Question:   Supervising Provider  ?  Answer:   Marin Olp [[id number]]  ? trimethoprim (TRIMPEX) 100 MG tablet  ?  Sig: Take 1 tablet (100 mg total) by mouth daily.  ?  Dispense:  90 tablet  ?  Refill:  1  ?  Order Specific Question:   Supervising Provider  ?  Answer:   Marin Olp [[id number]]  ? oxybutynin (DITROPAN-XL) 10 MG 24 hr tablet  ?  Sig: TAKE 1 TABLET BY MOUTH Q DAILY  ?  Dispense:  90 tablet  ?  Refill:  3  ?  Order Specific Question:   Supervising Provider  ?  Answer:   Marin Olp [[id number]]  ? ?1. Essential hypertension ?Blood pressure is elevated and not to goal.  She will continue on hydrochlorothiazide 12.5 mg.  Increase amlodipine back to 10 mg daily.  Refills sent.  Continue to monitor at home. ? ?2. IPF (idiopathic pulmonary fibrosis) ([place]) ?Patient follows with Dr. Vaughan Browner.  She is currently taking Esbriet.  Stays short of breath secondary to IPF, but no recent changes. ? ?3. Mixed hyperlipidemia ?Patient takes simvastatin 10 mg daily.  Medication refilled.  Plan for fasting labs at later date. ? ?4. Gastroesophageal reflux disease without esophagitis ?Patient is stable on Protonix 40 mg twice daily.  Refilled for her. ? ?5. TIA (transient ischemic attack) ?Per stated in history.  Patient takes Plavix 75 mg daily. ? ?6. History of prediabetes ?Lab Results  ?Component Value Date  ? HGBA1C 5.7 04/16/2020  ? ?Plan check at later date. ?She has been consuming more sweets, but she will continue to try to  work on lifestyle. ? ?7. Paresthesias ?Plan to check a B12 level at later date.  She continues to take vitamins as listed in her medication list. ? ?8. Recurrent UTI ?She follows with urology.  She requested ref

## 2022-04-20 ENCOUNTER — Other Ambulatory Visit (HOSPITAL_COMMUNITY): Payer: Self-pay

## 2022-04-20 ENCOUNTER — Ambulatory Visit
Admission: RE | Admit: 2022-04-20 | Discharge: 2022-04-20 | Disposition: A | Discharge status: Home / Self Care | Payer: Medicare HMO | Source: Ambulatory Visit | Attending: Physician Assistant | Admitting: Physician Assistant

## 2022-04-20 ENCOUNTER — Ambulatory Visit
Admission: RE | Admit: 2022-04-20 | Discharge: 2022-04-20 | Disposition: A | Discharge status: Home / Self Care | Payer: Medicare HMO | Source: Ambulatory Visit | Attending: Pulmonary Disease | Admitting: Pulmonary Disease

## 2022-04-20 DIAGNOSIS — J479 Bronchiectasis, uncomplicated: Secondary | ICD-10-CM | POA: Diagnosis not present

## 2022-04-20 DIAGNOSIS — J8410 Pulmonary fibrosis, unspecified: Secondary | ICD-10-CM | POA: Diagnosis not present

## 2022-04-20 DIAGNOSIS — Z1231 Encounter for screening mammogram for malignant neoplasm of breast: Secondary | ICD-10-CM | POA: Diagnosis not present

## 2022-04-20 DIAGNOSIS — I2510 Atherosclerotic heart disease of native coronary artery without angina pectoris: Secondary | ICD-10-CM | POA: Diagnosis not present

## 2022-04-20 DIAGNOSIS — J84112 Idiopathic pulmonary fibrosis: Secondary | ICD-10-CM

## 2022-04-20 DIAGNOSIS — J849 Interstitial pulmonary disease, unspecified: Secondary | ICD-10-CM | POA: Diagnosis not present

## 2022-04-21 ENCOUNTER — Other Ambulatory Visit (HOSPITAL_COMMUNITY): Payer: Self-pay

## 2022-04-22 MED ORDER — OXYBUTYNIN CHLORIDE ER 10 MG PO TB24: ORAL_TABLET | ORAL | 3 refills | Status: DC

## 2022-04-22 MED ORDER — PANTOPRAZOLE SODIUM 40 MG PO TBEC: 40.0000 mg | DELAYED_RELEASE_TABLET | Freq: Two times a day (BID) | ORAL | 2 refills | Status: DC

## 2022-04-22 MED ORDER — TRIMETHOPRIM 100 MG PO TABS: 100.0000 mg | ORAL_TABLET | Freq: Every day | ORAL | 1 refills | Status: DC

## 2022-04-27 ENCOUNTER — Other Ambulatory Visit (HOSPITAL_COMMUNITY): Payer: Self-pay

## 2022-05-05 ENCOUNTER — Ambulatory Visit: Payer: Medicare HMO | Admitting: Pulmonary Disease

## 2022-05-18 ENCOUNTER — Other Ambulatory Visit (HOSPITAL_COMMUNITY): Payer: Self-pay

## 2022-05-24 ENCOUNTER — Other Ambulatory Visit (HOSPITAL_COMMUNITY): Payer: Self-pay

## 2022-05-30 ENCOUNTER — Other Ambulatory Visit (HOSPITAL_COMMUNITY): Payer: Self-pay

## 2022-06-01 ENCOUNTER — Other Ambulatory Visit (HOSPITAL_COMMUNITY): Payer: Self-pay

## 2022-06-06 ENCOUNTER — Other Ambulatory Visit (HOSPITAL_COMMUNITY): Payer: Self-pay

## 2022-06-08 ENCOUNTER — Other Ambulatory Visit (HOSPITAL_COMMUNITY): Payer: Self-pay

## 2022-06-14 ENCOUNTER — Ambulatory Visit: Payer: Medicare HMO | Admitting: Pulmonary Disease

## 2022-06-14 ENCOUNTER — Encounter: Payer: Self-pay | Admitting: Pulmonary Disease

## 2022-06-14 VITALS — BP 136/62 | HR 71 | Temp 98.2°F | Ht 61.0 in | Wt 155.6 lb

## 2022-06-14 DIAGNOSIS — J84112 Idiopathic pulmonary fibrosis: Secondary | ICD-10-CM | POA: Diagnosis not present

## 2022-06-14 DIAGNOSIS — R0602 Shortness of breath: Secondary | ICD-10-CM | POA: Diagnosis not present

## 2022-06-14 DIAGNOSIS — I272 Pulmonary hypertension, unspecified: Secondary | ICD-10-CM | POA: Diagnosis not present

## 2022-06-14 DIAGNOSIS — Z5181 Encounter for therapeutic drug level monitoring: Secondary | ICD-10-CM

## 2022-06-14 DIAGNOSIS — J8410 Pulmonary fibrosis, unspecified: Secondary | ICD-10-CM | POA: Diagnosis not present

## 2022-06-14 NOTE — Patient Instructions (Signed)
We will check your oxygen levels to see if he can get a portable concentrator Check comprehensive metabolic panel and proBNP for dyspnea today Schedule PFTs in 6 months and follow-up in clinic

## 2022-06-14 NOTE — Addendum Note (Signed)
Addended by: Elton Sin on: 06/14/2022 04:57 PM   Modules accepted: Orders

## 2022-06-14 NOTE — Progress Notes (Signed)
Maria [name]    [id number]    [birth year]  Primary Care Physician:Allwardt, Randa Evens, PA-C  Referring Physician: Allwardt, Randa Evens, PA-C [place],  [place] [address]  Chief complaint:  Follow-up for IPF Started Esbriet September 7955  HPI: 81 year old with history of hypertension, stroke, sleep apnea. Referred for evaluation of interstitial lung disease This was noted on CT scan done in 2018.  Notes increasing cough, dyspnea over the past few months.  Symptoms worsen after COVID-19 vaccination.  No mucus production, wheezing or chills.  She had an exposure to DDT as a child ( middle School) and had severe respiratory problems ever since, including multiple flu illnesses that would take her 6-8 weeks to recover from.  Finished pulmonary rehab in 2022 [place]wa continue [facility] at [facility] for second opinion in 2022  Pets: No pets Occupation: Retired Quarry manager at [employer] Exposures: Reports exposure to DDD as a child and to acids in her lab at work but no known ongoing exposures.  No mold, hot tub, Jacuzzi.  No asbestos, down pillows or comforters Smoking history: 5-pack-year smoker.  Quit in 1971 Travel history: No significant travel history Relevant family history: Grandfather had emphysema.  He was a pipe smoker  Interim history: She developed COVID-19 in January 2023 and was treated with molnupiravir. Has recovered but continues to be fatigued.  She is to stay active with hiking and water aerobics but has cut down on these now  Overall stable since last visit Is planning a trip to [place] on [place]  Outpatient Encounter Medications as of 06/14/2022  Medication Sig   amLODipine (NORVASC) 10 MG tablet Take 1 tablet (10 mg total) by mouth daily.   b complex vitamins tablet Take 1 tablet by mouth daily.   CALCIUM PO Take by mouth daily.   Cholecalciferol (VITAMIN D-3 PO) Take by mouth daily.   clopidogrel (PLAVIX) 75 MG  tablet TAKE 1 TABLET EVERY DAY   co-enzyme Q-10 30 MG capsule Take 30 mg by mouth 3 (three) times daily.   Collagen Hydrolysate POWD 1 Scoop by Does not apply route daily.   ferrous sulfate 324 MG TBEC Take 324 mg by mouth every other day.   hydrochlorothiazide (MICROZIDE) 12.5 MG capsule TAKE 1 CAPSULE EVERY DAY   levocetirizine (XYZAL) 5 MG tablet TAKE 1 TABLET EVERY EVENING   LUTEIN PO Take by mouth daily.   montelukast (SINGULAIR) 10 MG tablet TAKE 1 TABLET AT BEDTIME   Multiple Vitamins-Minerals (WOMENS MULTIVITAMIN PO) Take 1 capsule by mouth daily.   oxybutynin (DITROPAN-XL) 10 MG 24 hr tablet TAKE 1 TABLET BY MOUTH Q DAILY   pantoprazole (PROTONIX) 40 MG tablet Take 1 tablet (40 mg total) by mouth 2 (two) times daily.   Pirfenidone (ESBRIET) 267 MG CAPS TAKE 3 CAPSULES BY MOUTH WITH BREAKFAST, WITH LUNCH, AND WITH EVENING MEAL.   potassium chloride (KLOR-CON M) 10 MEQ tablet TAKE 1 TABLET EVERY DAY   simvastatin (ZOCOR) 10 MG tablet TAKE 1 TABLET EVERY DAY AT 6 PM   trimethoprim (TRIMPEX) 100 MG tablet Take 1 tablet (100 mg total) by mouth daily.   Turmeric 500 MG CAPS Take 1 capsule by mouth daily.   zinc gluconate 50 MG tablet Take 50 mg by mouth daily.   No facility-administered encounter medications on file as of 06/14/2022.   Physical Exam: Blood pressure 136/62, pulse 71, temperature 98.2 F (36.8 C), temperature source Oral, height  5' 1" (1.549 m), weight 155 lb 9.6 oz (70.6 kg), SpO2 97 %. Gen:      No acute distress HEENT:  EOMI, sclera anicteric Neck:     No masses; no thyromegaly Lungs:    Bibasal crackles CV:         Regular rate and rhythm; no murmurs Abd:      + bowel sounds; soft, non-tender; no palpable masses, no distension Ext:    No edema; adequate peripheral perfusion Skin:      Warm and dry; no rash Neuro: alert and oriented x 3 Psych: normal mood and affect   Data Reviewed: Imaging: High-resolution CT 03/29/2017-basal gradient fibrotic changes with  reticulation, traction bronchiectasis.  Moderate air trapping.  Probable UIP pattern.  High-res CT 04/15/2020-mildly progressive pulmonary fibrosis probable UIP pattern.  High-res CT 04/20/2022-stable pattern of fibrosis in probable UIP pattern. I have reviewed the images personally.  PFTs: 04/17/2017 FVC 1.69 [64%], FEV1 1.45 [73%], F/F 86, TLC 3.72 [76%], DLCO 14.90 [65%]  06/17/2020 FVC 1.72 [68%], FEV1 1.44 [76%], F/F 84, TLC 3.26 [66%], DLCO 14.53 [79%]  Moderate restriction with diffusion defect.  Slightly worsened compared to 2018  6-minute walk test 06/30/2020- 443 m, nadir O2 sat of 90%  Labs: RAST panel 04/27/2015-IgE 40, sensitive to grass, tree pollen CBC 02/18/2018-WBC 6.2, eos 3.9%, absolute eosinophil count 242  N-terminal proBNP 09/10/2019 1-24  ILD serologies 04/09/2020-atypical p-ANCA 1:80  Sleep: PSG 07/31/17, AHI 6.4, SpO2 low 85%. CPAP titration 11/15/2017 CPAP 7  Cardiac: Echocardiogram 09/02/2021-LVEF 60 to 99%, RV systolic function is normal.  Normal PA systolic pressure.  No evidence of shunt by bubble study  Assessment:  Pulmonary fibrosis Probable UIP pattern.  This is likely IPF with minimal exposures.  Serologies show mild elevation in ANCA which is likely nonspecific with no other signs and symptoms of autoimmune process.  Discussed findings in detail.  To make a diagnosis with certainty we would need a lung biopsy but not recommended given her age. Given mild progression on CT scan and PFTs with worsening symptoms we have decided to treat with antifibrotic Continue Esbriet Monitor LFTs  Check LFTs today She wants a portable concentrator to go on a trip to [place] and we will qualify her for this.  Mild pulmonary hypertension Noted on echocardiogram in 2021 however repeat study did not show any evidence of pulmonary hypertension.  Continue monitoring.  Chronic cough Secondary to pulmonary fibrosis, GERD Continue PPI, Tessalon Advised to raise head of the  bed and not lay down at least 2 hours after eating  OSA Stable Download reviewed with good compliance  Plan/Recommendations: - Continue Esbriet, check LFTs - PFTs in 6 months - Qualify for portable concentrator  Marshell Garfinkel MD [facility] 06/14/2022, 3:39 PM  CC: Allwardt, Randa Evens, PA-C

## 2022-06-14 NOTE — Addendum Note (Signed)
Addended by: Elton Sin on: 06/14/2022 04:05 PM   Modules accepted: Orders

## 2022-06-15 LAB — COMPREHENSIVE METABOLIC PANEL
ALT: 13 U/L (ref 0–35)
AST: 20 U/L (ref 0–37)
Albumin: 4.3 g/dL (ref 3.5–5.2)
Alkaline Phosphatase: 79 U/L (ref 39–117)
BUN: 12 mg/dL (ref 6–23)
CO2: 29 mEq/L (ref 19–32)
Calcium: 9.6 mg/dL (ref 8.4–10.5)
Chloride: 98 mEq/L (ref 96–112)
Creatinine, Ser: 0.7 mg/dL (ref 0.40–1.20)
GFR: 81.01 mL/min (ref >60.00)
Glucose, Bld: 80 mg/dL (ref 70–99)
Potassium: 4.1 mEq/L (ref 3.5–5.1)
Sodium: 134 mEq/L — ABNORMAL LOW (ref 135–145)
Total Bilirubin: 0.3 mg/dL (ref 0.2–1.2)
Total Protein: 6.8 g/dL (ref 6.0–8.3)

## 2022-06-15 LAB — PRO B NATRIURETIC PEPTIDE: NT-Pro BNP: 50 pg/mL (ref 0–738)

## 2022-06-21 ENCOUNTER — Telehealth: Payer: Self-pay | Admitting: Pulmonary Disease

## 2022-06-22 ENCOUNTER — Other Ambulatory Visit (HOSPITAL_COMMUNITY): Payer: Self-pay

## 2022-06-22 NOTE — Telephone Encounter (Signed)
[employer] is calling again to see if there is another provider that can sign off on the prescription in Dr. Matilde Bash absence.  Please advise and call to let Fara Chute know if this can happen sooner than the wk of 7/10.  CB# [phone]

## 2022-06-22 NOTE — Telephone Encounter (Signed)
The fax from [facility] has been received and was placed in Dr Matilde Bash lookat in A pod. Spoke with Fara Chute at [facility] and informed her that we have received this and it will be signed wk of 06/26/22 when MD returns to clinic.

## 2022-06-23 ENCOUNTER — Other Ambulatory Visit (HOSPITAL_COMMUNITY): Payer: Self-pay

## 2022-06-26 NOTE — Telephone Encounter (Signed)
[employer] form received and will be follow up 06/27/22 when Dr. Vaughan Browner returns to clinic.

## 2022-06-26 NOTE — Telephone Encounter (Signed)
[name], left message letting her know that Dr. Vaughan Browner will be in the office tomorrow and can do form then

## 2022-06-27 ENCOUNTER — Telehealth: Payer: Self-pay | Admitting: Physician Assistant

## 2022-06-27 ENCOUNTER — Other Ambulatory Visit: Payer: Self-pay

## 2022-06-27 MED ORDER — AMLODIPINE BESYLATE 10 MG PO TABS: 10.0000 mg | ORAL_TABLET | Freq: Every day | ORAL | 3 refills | Status: DC

## 2022-06-27 NOTE — Telephone Encounter (Signed)
Rx sent to [facility] as requested by patient

## 2022-06-27 NOTE — Telephone Encounter (Signed)
[employer] form signed and received from Dr. Vaughan Browner.  Form faxed to [facility] [phone].  Nothing further at this time.

## 2022-06-27 NOTE — Telephone Encounter (Signed)
  Patient called regarding [facility] told Patient they only have a RX for 5 mg tablets for  amLODipine (NORVASC) 10 MG tablet. Patient requests RX for the above medication be sent to the following Pharmacy for 61m tablets (Patient states she has been doubling up the 5 mg tablets):  [facility]  [phone] Fax:  [phone]    Patient states she calls [facility] when she wants her RX's sent.

## 2022-06-30 ENCOUNTER — Other Ambulatory Visit: Payer: Medicare HMO

## 2022-07-03 ENCOUNTER — Other Ambulatory Visit (HOSPITAL_COMMUNITY): Payer: Self-pay

## 2022-07-04 ENCOUNTER — Encounter: Payer: Self-pay | Admitting: *Deleted

## 2022-07-04 ENCOUNTER — Other Ambulatory Visit (HOSPITAL_COMMUNITY): Payer: Self-pay

## 2022-07-04 ENCOUNTER — Telehealth: Payer: Self-pay

## 2022-07-04 NOTE — Telephone Encounter (Signed)
Received notification from [facility] that pt's grant had expired. There are not currently any open grants at this time. Will f/u with pt regarding initiating PAP process.

## 2022-07-05 ENCOUNTER — Ambulatory Visit (INDEPENDENT_AMBULATORY_CARE_PROVIDER_SITE_OTHER): Payer: Medicare HMO | Admitting: Pulmonary Disease

## 2022-07-05 ENCOUNTER — Other Ambulatory Visit (HOSPITAL_COMMUNITY): Payer: Self-pay

## 2022-07-05 ENCOUNTER — Ambulatory Visit: Payer: Medicare HMO | Admitting: Pulmonary Disease

## 2022-07-05 ENCOUNTER — Other Ambulatory Visit (INDEPENDENT_AMBULATORY_CARE_PROVIDER_SITE_OTHER): Payer: Medicare HMO

## 2022-07-05 DIAGNOSIS — I10 Essential (primary) hypertension: Secondary | ICD-10-CM

## 2022-07-05 DIAGNOSIS — J84112 Idiopathic pulmonary fibrosis: Secondary | ICD-10-CM | POA: Diagnosis not present

## 2022-07-05 DIAGNOSIS — N390 Urinary tract infection, site not specified: Secondary | ICD-10-CM | POA: Diagnosis not present

## 2022-07-05 DIAGNOSIS — R202 Paresthesia of skin: Secondary | ICD-10-CM | POA: Diagnosis not present

## 2022-07-05 DIAGNOSIS — Z8639 Personal history of other endocrine, nutritional and metabolic disease: Secondary | ICD-10-CM | POA: Diagnosis not present

## 2022-07-05 DIAGNOSIS — E782 Mixed hyperlipidemia: Secondary | ICD-10-CM

## 2022-07-05 DIAGNOSIS — Z87898 Personal history of other specified conditions: Secondary | ICD-10-CM | POA: Diagnosis not present

## 2022-07-05 LAB — VITAMIN B12: Vitamin B-12: 508 pg/mL (ref 211–911)

## 2022-07-05 LAB — CBC WITH DIFFERENTIAL/PLATELET
Basophils Absolute: 0 10*3/uL (ref 0.0–0.1)
Basophils Relative: 0.6 % (ref 0.0–3.0)
Eosinophils Absolute: 0.1 10*3/uL (ref 0.0–0.7)
Eosinophils Relative: 1.9 % (ref 0.0–5.0)
HCT: 40.9 % (ref 36.0–46.0)
Hemoglobin: 13.8 g/dL (ref 12.0–15.0)
Lymphocytes Relative: 27.6 % (ref 12.0–46.0)
Lymphs Abs: 1.9 10*3/uL (ref 0.7–4.0)
MCHC: 33.6 g/dL (ref 30.0–36.0)
MCV: 90.8 fl (ref 78.0–100.0)
Monocytes Absolute: 0.7 10*3/uL (ref 0.1–1.0)
Monocytes Relative: 10.8 % (ref 3.0–12.0)
Neutro Abs: 4 10*3/uL (ref 1.4–7.7)
Neutrophils Relative %: 59.1 % (ref 43.0–77.0)
Platelets: 297 10*3/uL (ref 150.0–400.0)
RBC: 4.51 Mil/uL (ref 3.87–5.11)
RDW: 13.9 % (ref 11.5–15.5)
WBC: 6.8 10*3/uL (ref 4.0–10.5)

## 2022-07-05 LAB — COMPREHENSIVE METABOLIC PANEL
ALT: 14 U/L (ref 0–35)
AST: 22 U/L (ref 0–37)
Albumin: 4.4 g/dL (ref 3.5–5.2)
Alkaline Phosphatase: 81 U/L (ref 39–117)
BUN: 14 mg/dL (ref 6–23)
CO2: 31 mEq/L (ref 19–32)
Calcium: 10 mg/dL (ref 8.4–10.5)
Chloride: 98 mEq/L (ref 96–112)
Creatinine, Ser: 0.73 mg/dL (ref 0.40–1.20)
GFR: 77 mL/min (ref >60.00)
Glucose, Bld: 91 mg/dL (ref 70–99)
Potassium: 5.1 mEq/L (ref 3.5–5.1)
Sodium: 134 mEq/L — ABNORMAL LOW (ref 135–145)
Total Bilirubin: 0.4 mg/dL (ref 0.2–1.2)
Total Protein: 7.3 g/dL (ref 6.0–8.3)

## 2022-07-05 LAB — LIPID PANEL
Cholesterol: 184 mg/dL (ref 0–200)
HDL: 75.7 mg/dL (ref >39.00)
LDL Cholesterol: 96 mg/dL (ref 0–99)
NonHDL: 108.25
Total CHOL/HDL Ratio: 2
Triglycerides: 63 mg/dL (ref 0.0–149.0)
VLDL: 12.6 mg/dL (ref 0.0–40.0)

## 2022-07-05 LAB — HEMOGLOBIN A1C: Hgb A1c MFr Bld: 5.4 % (ref 4.6–6.5)

## 2022-07-05 NOTE — Patient Instructions (Signed)
Full PFT Performed Today  

## 2022-07-05 NOTE — Progress Notes (Signed)
Full PFT Performed Today  

## 2022-07-06 ENCOUNTER — Other Ambulatory Visit (HOSPITAL_COMMUNITY): Payer: Self-pay

## 2022-07-06 NOTE — Telephone Encounter (Signed)
Spoke with pt regarding grant situation and she is amenable to moving forward with PAP process. She states she is out of town today, but informs me that she will be available on Monday to come to clinic and sign application form. Will begin drafting [place] paperwork and will place provider portion into Dr. Matilde Bash mailbox for signature.

## 2022-07-10 ENCOUNTER — Telehealth: Payer: Self-pay | Admitting: Pharmacist

## 2022-07-10 ENCOUNTER — Other Ambulatory Visit (HOSPITAL_COMMUNITY): Payer: Self-pay

## 2022-07-10 NOTE — Telephone Encounter (Signed)
Submitted Patient Assistance Application to [place] for [facility] along with provider portion, PA and income documents. Will update patient when we receive a response.  Fax# [phone] Phone# [phone]

## 2022-07-10 NOTE — Telephone Encounter (Signed)
Provide portion received, waiting for pt to come to clinic to sign documentation. Spoke to pt who states she still plans to come to clinic today to sign paperwork. She stated she would be out of medication in three days, discussed options. We have one sample bottle left, will provide to pt to tide her over.

## 2022-07-10 NOTE — Telephone Encounter (Signed)
Esbriet medication sample provided to patient. NDC: [address]-40 Lot: [id number] Exp: 05/2024 #189 tablets  Directions: take 3 tablets three times daily.  Knox Saliva, PharmD, MPH, BCPS, CPP Clinical Pharmacist (Rheumatology and Pulmonology)

## 2022-07-12 NOTE — Telephone Encounter (Signed)
Received a fax from [place] regarding an approval for [place] patient assistance from 07/12/2022 until patient no longer qualifies due to discontinuation of therapy, changes to patient's current financial status or health insurance and/or patient no longer meets the program eligibility requirements. Called pt and provided update and next steps, phone numbers have been provided. Encouraged pt to reach out if she has any additional questions or concerns. Pt verbalized understanding to all. Relevant Documents have been sent to scan center.  [facility] Phone#: [phone] option 5 [facility] Phone#: [phone]

## 2022-07-14 ENCOUNTER — Other Ambulatory Visit (HOSPITAL_COMMUNITY): Payer: Self-pay

## 2022-07-17 LAB — PULMONARY FUNCTION TEST
DL/VA % pred: 107 %
DL/VA: 4.42 ml/min/mmHg/L
DLCO cor % pred: 76 %
DLCO cor: 13.38 ml/min/mmHg
DLCO unc % pred: 76 %
DLCO unc: 13.38 ml/min/mmHg
FEF 25-75 Post: 2.06 L/sec
FEF 25-75 Pre: 1.95 L/sec
FEF2575-%Change-Post: 5 %
FEF2575-%Pred-Post: 165 %
FEF2575-%Pred-Pre: 156 %
FEV1-%Change-Post: 0 %
FEV1-%Pred-Post: 84 %
FEV1-%Pred-Pre: 84 %
FEV1-Post: 1.45 L
FEV1-Pre: 1.45 L
FEV1FVC-%Change-Post: -1 %
FEV1FVC-%Pred-Pre: 118 %
FEV6-%Change-Post: 0 %
FEV6-%Pred-Post: 76 %
FEV6-%Pred-Pre: 76 %
FEV6-Post: 1.69 L
FEV6-Pre: 1.67 L
FEV6FVC-%Pred-Post: 106 %
FEV6FVC-%Pred-Pre: 106 %
FVC-%Change-Post: 0 %
FVC-%Pred-Post: 72 %
FVC-%Pred-Pre: 71 %
FVC-Post: 1.69 L
FVC-Pre: 1.67 L
Post FEV1/FVC ratio: 86 %
Post FEV6/FVC ratio: 100 %
Pre FEV1/FVC ratio: 87 %
Pre FEV6/FVC Ratio: 100 %
RV % pred: 60 %
RV: 1.39 L
TLC % pred: 69 %
TLC: 3.31 L

## 2022-07-18 ENCOUNTER — Other Ambulatory Visit (HOSPITAL_COMMUNITY): Payer: Self-pay

## 2022-07-21 ENCOUNTER — Telehealth: Payer: Self-pay | Admitting: Pulmonary Disease

## 2022-07-21 NOTE — Telephone Encounter (Signed)
Spoke with pt who states she is leaving for a trip tomorrow and while she does have enough Esibriet to last till she gets back on 07/28/22  she will run out on 07/29/22 and leaving on a cruise shortly after until the end of the month. Pt has been in contact with [facility] and has worked out a Chemical engineer for getting more Esibriet from them but pt need the assistance of our pharmacy to work it out.   Pharmacy could you please speak with pt and assist her with whatever is possible? Thank you

## 2022-07-21 NOTE — Telephone Encounter (Signed)
Patient requesting Esbriet to be shipped to clinic. I advised that our office closes at 4:30p, and if no one can sign for the medication, then the medication will be sent all the way back to the pharmacy before re-shipping  Patient requested another sample. I advised her that we do not have any more Esbriet samples and cannot secure more samples as the manufacturer no longer provides sample (due to generic).  Patient states she will be in [place] from 07/22/22 through 07/28/22.  She states she will be in and out of the house while in [place].  She is going to [place] on 08/02/22 until 08/16/22.   Reached out to [facility] to inquire if shipment could be scheduled to clinic for 07/27/22 ATTN pharmacy team. They are not able to guarantee shipment time or notate office hours. Rep recommended reaching out to [employer] once shipment is set up to see estimated delivery time. Patient advised of delivery date on 07/27/22 and again that shipment time could not be confirmed but that we'd be in touch if we get shipment  Order # [id number]  Knox Saliva, PharmD, MPH, BCPS, CPP Clinical Pharmacist (Rheumatology and Pulmonology)

## 2022-07-27 DIAGNOSIS — J849 Interstitial pulmonary disease, unspecified: Secondary | ICD-10-CM | POA: Diagnosis not present

## 2022-07-27 DIAGNOSIS — G4733 Obstructive sleep apnea (adult) (pediatric): Secondary | ICD-10-CM | POA: Diagnosis not present

## 2022-07-28 NOTE — Telephone Encounter (Signed)
Pt's package has been received by pharmacy team. Pt has been notified and advised of office hours, pt verbalized understanding.

## 2022-07-28 NOTE — Telephone Encounter (Signed)
Medication has been picked up by pt. Recommended verifying with [facility] that the next refill will be shipped to the pt's home address rather than to the clinic. Pt verbalized understanding.

## 2022-08-01 ENCOUNTER — Telehealth: Payer: Self-pay | Admitting: Pulmonary Disease

## 2022-08-01 NOTE — Telephone Encounter (Signed)
I am fine to do that letter.  Diagnoses IPF and chronic hypoxemic respiratory failure.  Please do it and I will sign it right away

## 2022-08-01 NOTE — Telephone Encounter (Signed)
Called and spoke with patient. She stated that she needs a letter in order to use her POC during her flight and on her cruise. She confirmed that she is using 3L of O2 on the POC. She needs the letter before 5pm since she is leaving tomorrow morning. She also stated that she needs 3 copies of the letter.   MR, please advise if you are ok with providing her with this type of letter on behalf of Dr. Vaughan Browner. Thanks!

## 2022-08-01 NOTE — Telephone Encounter (Signed)
Letter has been given to patient.

## 2022-08-01 NOTE — Telephone Encounter (Signed)
Patient needs a letter for her flight and cruise she taking. It needs to state that she needs her POC. She thiks she has one bt can't find it. She also wants to know if she can take a motion sickness pill with the meds she already takes. She needs the letter by the end of today

## 2022-08-02 NOTE — Telephone Encounter (Signed)
Seems like encounter was open in error so closing encounter.  

## 2022-08-22 DIAGNOSIS — H5203 Hypermetropia, bilateral: Secondary | ICD-10-CM | POA: Diagnosis not present

## 2022-08-22 DIAGNOSIS — H43812 Vitreous degeneration, left eye: Secondary | ICD-10-CM | POA: Diagnosis not present

## 2022-08-22 DIAGNOSIS — H35373 Puckering of macula, bilateral: Secondary | ICD-10-CM | POA: Diagnosis not present

## 2022-08-22 DIAGNOSIS — H16121 Filamentary keratitis, right eye: Secondary | ICD-10-CM | POA: Diagnosis not present

## 2022-08-22 DIAGNOSIS — H02839 Dermatochalasis of unspecified eye, unspecified eyelid: Secondary | ICD-10-CM | POA: Diagnosis not present

## 2022-08-22 DIAGNOSIS — Z961 Presence of intraocular lens: Secondary | ICD-10-CM | POA: Diagnosis not present

## 2022-08-27 DIAGNOSIS — G4733 Obstructive sleep apnea (adult) (pediatric): Secondary | ICD-10-CM | POA: Diagnosis not present

## 2022-08-27 DIAGNOSIS — J849 Interstitial pulmonary disease, unspecified: Secondary | ICD-10-CM | POA: Diagnosis not present

## 2022-09-01 ENCOUNTER — Telehealth: Payer: Self-pay | Admitting: Pharmacist

## 2022-09-01 ENCOUNTER — Telehealth: Payer: Self-pay | Admitting: Physician Assistant

## 2022-09-01 NOTE — Telephone Encounter (Signed)
Patient states: - She was exposed to [place], tested today and got faint positive  - Currently has no symptoms  - Is not able to access mychart to have a video visit or e-visit -She is worried about what to do if she develops symptoms and needs medication  Patient has been transferred to triage due to lack of other options since office is closed.

## 2022-09-01 NOTE — Telephone Encounter (Signed)
Went to eye doctor about 9-10 days ago. She states she is having blurred vision and dry eyes since her cataract surgery.  Reviewed that there is no drug interaction between pirfenidone and neomycin/dexamethasone eye drops. She states she is still trying to contact ophthalmologist regarding rx that was sent in but was not covered by insurance. She picked up OTC eye drops and was curious to see if there was interactions.  I reviewed that pirfenidone is not immunosuppressive and would not affect efficacy eyedrops. She states she does not have eye infection. Recommended she follow back up with ophthalmologist  Knox Saliva, PharmD, MPH, BCPS, CPP Clinical Pharmacist (Rheumatology and Pulmonology)

## 2022-09-01 NOTE — Telephone Encounter (Signed)
Patient Name: Maria [name] Gender: Female DOB: 11-Nov-1941 Age: 81 Y 9 M 26 D Return Phone Number: [phone] (Primary), [phone] (Secondary) Address: City/ State/ Zip: [place]  [address] Client [place] [facility] [place] [facility] [place] [facility] Paediatric nurse, Freight forwarder- PA Contact Type Call Who Is Calling Patient / Member / Family / Caregiver Call Type Triage / Clinical Relationship To Patient Self Return Phone Number [phone] (Primary) Chief Complaint Headache Reason for Call Symptomatic / Request for Independence states, patient tested positive for COVID. Office is closed. Cannot access chart. Has no symptoms. Headache last night. Translation No Nurse Assessment Nurse: Louie Boston, RN, Barnetta Chapel Date/Time (Eastern Time): 09/01/2022 5:07:43 PM Confirm and document reason for call. If symptomatic, describe symptoms. ---caller states she tested positive covid test today. exposed tuesday. no symptoms except slight HA last night. has pulmonary fibrosis and always has a little cough. Does the patient have any new or worsening symptoms? ---Yes Will a triage be completed? ---Yes Related visit to physician within the last 2 weeks? ---Yes Does the PT have any chronic conditions? (i.e. diabetes, asthma, this includes High risk factors for pregnancy, etc.) ---Yes List chronic conditions. ---eye issue, htn, GERD, high cholesterol, allergies, Is this a behavioral health or substance abuse call? ---No Guidelines Guideline Title Affirmed Question Affirmed Notes Nurse Date/Time ([place] Time) COVID-19 - Diagnosed or Suspected Oxygen level (e.g., pulse oximetry) 91 to 94 percent Urcheck, RN, Barnetta Chapel 09/01/2022 5:1  Disp. Time Eilene Ghazi Time) Disposition Final User 09/01/2022 5:17:14 PM Call PCP Now Yes Louie Boston, RN, Barnetta Chapel 09/01/2022 5:18:54 PM Paged On Call back to Call  [place], [place] Final Disposition 09/01/2022 5:17:14 PM Call PCP Now Yes Louie Boston, RN, Ledell Noss Disagree/Comply Comply Caller Understands Yes PreDisposition InappropriateToAsk Care Advice Given Per Guideline CALL PCP NOW: * You need to discuss this with your doctor (or NP/PA). CARE ADVICE given per COVID-19 - DIAGNOSED OR SUSPECTED (Adult) guideline. CALL BACK IF: * You become worse * I'll page the on-call provider now. If you haven't heard from the provider (or me) within 30 minutes, call again. GENERAL CARE ADVICE FOR COVID-19 SYMPTOMS: * The symptoms are generally treated the same whether you have COVID-19, influenza or some other respiratory virus. * Feeling dehydrated: Drink extra liquids. If the air in your home is dry, use a humidifier. Comments User: Debbora Presto, RN Date/Time Eilene Ghazi Time): 09/01/2022 5:12:46 PM pulse ox is normally 90-95 when sitting. she is 92% User: Debbora Presto, RN Date/Time Eilene Ghazi Time): 09/01/2022 5:14:00 PM bp 134/83 User: Debbora Presto, RN Date/Time Eilene Ghazi Time): 09/01/2022 5:15:35 PM [facility] [address] [facility] [place] [address] Paging DoctorName Phone DateTime Result/ Outcome Message Type Notes Alysia Penna - MD [phone] 09/01/2022 5:18:54 PM Paged On Call Back to Call Center Doctor Paged [phone] Ellen Henri Alysia Penna - MD 09/01/2022 5:31:38 PM Spoke with On Call - General Message Result Dr will call in some antiviral medication to take bid for 5 days. if more sob or pulse ox is under 88%, call 911/get in to ER. Pt notified and verbalized understanding

## 2022-09-03 NOTE — Telephone Encounter (Signed)
Noted, appt with me tomorrow.

## 2022-09-04 ENCOUNTER — Encounter: Payer: Medicare HMO | Admitting: Physician Assistant

## 2022-09-05 NOTE — Progress Notes (Signed)
error 

## 2022-09-11 ENCOUNTER — Encounter: Payer: Self-pay | Admitting: *Deleted

## 2022-09-26 DIAGNOSIS — G4733 Obstructive sleep apnea (adult) (pediatric): Secondary | ICD-10-CM | POA: Diagnosis not present

## 2022-09-26 DIAGNOSIS — J849 Interstitial pulmonary disease, unspecified: Secondary | ICD-10-CM | POA: Diagnosis not present

## 2022-10-04 ENCOUNTER — Telehealth: Payer: Self-pay

## 2022-10-04 NOTE — Patient Instructions (Signed)
Visit Information  Thank you for taking time to visit with me today. Please don't hesitate to contact me if I can be of assistance to you.   Following are the goals we discussed today:   Goals Addressed             This Visit's Progress    COMPLETED: Care Coordination Activities - no follow up required       Care Coordination Interventions: Provided education to patient re: care coordination services, Annual Wellness Visit Assessed social determinant of health barriers Completed Annual Wellness Visit 04/17/22         If you are experiencing a Mental Health or [place] or need someone to talk to, please call the Suicide and Crisis Lifeline: 988 call the [place] National Suicide Prevention Lifeline: (859)625-0941 or TTY: 262 684 8502 TTY 316 409 7898) to talk to a trained counselor call 1-800-273-TALK (toll free, 24 hour hotline) go to [facility] 262-275-9396) call 911   The patient verbalized understanding of instructions, educational materials, and care plan provided today and DECLINED offer to receive copy of patient instructions, educational materials, and care plan.   No further follow up required:    Peter Garter RN, Jackquline Denmark, [place] Management [phone]

## 2022-10-04 NOTE — Patient Outreach (Signed)
  Care Coordination   Initial Visit Note   10/04/2022 Name: NERY KALISZ MRN: [id number] DOB: Dec 07, 1941  Clista Bernhardt Munroe is a 81 y.o. year old female who sees Allwardt, Randa Evens, PA-C for primary care. I spoke with  Tyneisha A [place] by phone today.  What matters to the patients health and wellness today?  No concerns today    Goals Addressed             This Visit's Progress    COMPLETED: Care Coordination Activities - no follow up required       Care Coordination Interventions: Provided education to patient re: care coordination services, Annual Wellness Visit Assessed social determinant of health barriers Completed Annual Wellness Visit 04/17/22         SDOH assessments and interventions completed:  Yes  SDOH Interventions Today    Flowsheet Row Most Recent Value  SDOH Interventions   Food Insecurity Interventions Intervention Not Indicated  Housing Interventions Intervention Not Indicated  Transportation Interventions Intervention Not Indicated  Utilities Interventions Intervention Not Indicated        Care Coordination Interventions Activated:  Yes  Care Coordination Interventions:  Yes, provided   Follow up plan: No further intervention required.   Encounter Outcome:  Pt. Visit Completed {THN Tip this will not be part of the note when signed-REQUIRED REPORT FIELD DO NOT DELETE (Optional):[id number] Peter Garter RN, BSN,CCM, [facility] [phone]

## 2022-10-11 ENCOUNTER — Telehealth: Payer: Self-pay | Admitting: Pharmacist

## 2022-10-11 NOTE — Telephone Encounter (Signed)
Patient's Esbriet was shipped to clinic instead of her home. She states she will come by today before 4:30pm to pick up shipment  Knox Saliva, PharmD, MPH, BCPS, CPP Clinical Pharmacist (Rheumatology and Pulmonology)

## 2022-10-16 ENCOUNTER — Encounter: Payer: Self-pay | Admitting: Physician Assistant

## 2022-10-16 ENCOUNTER — Ambulatory Visit (INDEPENDENT_AMBULATORY_CARE_PROVIDER_SITE_OTHER): Payer: Medicare HMO | Admitting: Physician Assistant

## 2022-10-16 ENCOUNTER — Ambulatory Visit (HOSPITAL_COMMUNITY)
Admission: RE | Admit: 2022-10-16 | Discharge: 2022-10-16 | Disposition: A | Discharge status: Home / Self Care | Payer: Medicare HMO | Source: Ambulatory Visit | Attending: Physician Assistant | Admitting: Physician Assistant

## 2022-10-16 ENCOUNTER — Telehealth: Payer: Self-pay | Admitting: Physician Assistant

## 2022-10-16 VITALS — BP 122/74 | HR 85 | Temp 98.2°F | Ht 61.0 in | Wt 152.2 lb

## 2022-10-16 DIAGNOSIS — M79662 Pain in left lower leg: Secondary | ICD-10-CM

## 2022-10-16 DIAGNOSIS — R0602 Shortness of breath: Secondary | ICD-10-CM | POA: Diagnosis not present

## 2022-10-16 DIAGNOSIS — M7989 Other specified soft tissue disorders: Secondary | ICD-10-CM | POA: Insufficient documentation

## 2022-10-16 DIAGNOSIS — J84112 Idiopathic pulmonary fibrosis: Secondary | ICD-10-CM

## 2022-10-16 NOTE — Progress Notes (Signed)
Subjective:    Patient ID: Maria [name], female    DOB: 1941/02/15, 81 y.o.   MRN: [id number]  Chief Complaint  Patient presents with   Follow-up    Pt c/o pain in left leg; pt states acute attack past several days when out of town and came home so she could be evaluated; pt states she has neuropathy in both legs; feels more like a burning type of pain every 3-6 hours; been constant since Thursday; concerned with clot;     HPI Patient with history of bilateral leg neuropathy ( somewhat controlled with Voltaren gel) is in today for acute left lower leg pain. Pt states two days ago her leg suddenly became very swollen and she had a red, hot knot on the back of her calf. That night, swelling seemed to go down. It is still painful, so it scared enough to come home early from beach trip. Never has had a clot before. She has been taking Tylenol. Pain is just not going away, no longer red or hot though. Has had some increased use of her oxygen the last 24 hours, but denies CP or chest pressure. Hx of fibrosis, so unsure if she overdid it at beach or not.   Past Medical History:  Diagnosis Date   Anemia    Arthritis    Bronchitis    Carpal tunnel syndrome    in calves neuropathy   Diverticulosis    GERD (gastroesophageal reflux disease)    Headache(784.0)    chronic   High cholesterol    Hyperlipidemia    Hypertension    Migraine    Nephritis    OSA (obstructive sleep apnea)    Palpitations    Personal history of colonic adenomas 09/15/2010   Stroke ([place])    TIA (transient ischemic attack)    Vertigo, benign paroxysmal     Past Surgical History:  Procedure Laterality Date   BLADDER REPAIR     BREAST BIOPSY     rt benign cyst   BREAST EXCISIONAL BIOPSY Right    CARPAL TUNNEL RELEASE     x 2   COLONOSCOPY     REPLACEMENT TOTAL KNEE BILATERAL     TONSILLECTOMY     UPPER GASTROINTESTINAL ENDOSCOPY      Family History  Problem Relation Age of Onset   Diabetes Father     Aneurysm Father    Kidney disease Father    Hypertension Father    Heart disease Mother    Colon polyps Mother    Chronic bronchitis Mother    Diabetes Brother    Colon cancer Maternal Grandmother    Irritable bowel syndrome Daughter    Emphysema Maternal Grandfather        smoked a pipe    Social History   Tobacco Use   Smoking status: Former    Packs/day: 1.00    Years: 10.00    Total pack years: 10.00    Types: Cigarettes    Quit date: 12/18/1969    Years since quitting: 52.8    Passive exposure: Past   Smokeless tobacco: Never  Vaping Use   Vaping Use: Never used  Substance Use Topics   Alcohol use: Yes    Comment: ocassional use   Drug use: No     Allergies  Allergen Reactions   Amoxicillin Hives   Olmesartan Hives   Ace Inhibitors Other (See Comments)    unknown   Bee Venom    Benicar [Olmesartan  Medoxomil] Other (See Comments)    Tongue swelling   Codeine     REACTION: nausea   Covid-19 (Mrna) Vaccine     Increased shortness of Breath in Fibrosis   Lisinopril Other (See Comments)    unknown   Macrobid [Nitrofurantoin Monohyd Macro]     Pulmonary Fibrosis   Nutritional Supplements     Bee stings cause angioedema-carry an EPIPEN   Penicillins    Shingrix [Zoster Vac Recomb Adjuvanted] Other (See Comments)    Chills, feverish, nausea x 4 days    Review of Systems NEGATIVE UNLESS OTHERWISE INDICATED IN HPI      Objective:     BP 122/74 (BP Location: Left Arm)   Pulse 85   Temp 98.2 F (36.8 C) (Temporal)   Ht _0  (1.549 m)   Wt 152 lb 3.2 oz (69 kg)   SpO2 98%   BMI 28.76 kg/m   Wt Readings from Last 3 Encounters:  10/16/22 152 lb 3.2 oz (69 kg)  09/04/22 155 lb (70.3 kg)  06/14/22 155 lb 9.6 oz (70.6 kg)    BP Readings from Last 3 Encounters:  10/16/22 122/74  06/14/22 136/62  04/19/22 140/86     Physical Exam Constitutional:      Appearance: Normal appearance.     Comments: Wearing portable oxygen  Cardiovascular:      Rate and Rhythm: Normal rate and regular rhythm.     Pulses: Normal pulses.     Heart sounds: No murmur heard. Pulmonary:     Effort: Pulmonary effort is normal.     Breath sounds: Normal breath sounds. No wheezing or rales.  Musculoskeletal:        General: Tenderness (left lower leg, calf; no redness noted; Homan's sign negative) present.     Right lower leg: No edema.     Left lower leg: No edema.  Skin:    Findings: No lesion or rash.  Neurological:     Mental Status: She is alert.     Gait: Gait normal.  Psychiatric:        Mood and Affect: Mood normal.        Assessment & Plan:  Pain and swelling of left lower leg -     VAS [place] LOWER EXTREMITY VENOUS (DVT)  Shortness of breath  IPF (idiopathic pulmonary fibrosis) ([place])   81 year old female with history of pulmonary fibrosis, prior TIA and CVA currently on Plavix, also has history of neuropathy bilateral lower legs, concerned about possible acute DVT in left lower extremity.  She has noticed slight increase in shortness of breath and has been wearing her oxygen more frequently.  On exam, she is not in any signs of distress.  She is not tachypneic or tachycardic.  She is tender with palpation to the left calf.  I discussed with my supervising physician, Dr. Yong Channel, about presentation of the patient and we agree it may be best for her to be evaluated in the emergency department.  Offered this option to the patient and we discussed in detail, she feels well enough to try to order just a Doppler outpatient and if this were positive for DVT, then she will present to the ED.  I am okay with this plan and will attempt to order a stat Doppler.  She understands that if she suddenly worsens with severe shortness of breath, chest pain or pressure, sudden severe pain in her leg, she will need to go to the ED.  Return if symptoms worsen or fail to improve.  This note was prepared with assistance of Systems analyst.  Occasional wrong-word or sound-a-like substitutions may have occurred due to the inherent limitations of voice recognition software.  Time Spent: 39 minutes of total time was spent on the date of the encounter performing the following actions: chart review prior to seeing the patient, obtaining history, performing a medically necessary exam, counseling on the treatment plan, placing orders, and documenting in our EHR.       Scott Vanderveer M Amy Gothard, PA-C

## 2022-10-16 NOTE — Telephone Encounter (Signed)
Returned [place] call from [facility] and took report. States pt is negative for DVT pt has small Bakers cyst only.

## 2022-10-16 NOTE — Telephone Encounter (Signed)
Noted. I personally called the patient and informed her of negative DVT report.  She does have a small Baker's cyst there.  I advised patient to use Tylenol and/or ibuprofen for pain she can rest and elevate (verified normal labs).  She can use a heating pad.  She will recheck with me if no improvement of symptoms.  ED precautions also advised.

## 2022-10-16 NOTE — Telephone Encounter (Signed)
Maria [name] with [facility] states she has a DVT Report ph# [phone]

## 2022-10-16 NOTE — Patient Instructions (Signed)
Please present to [address] which is the [facility].  Arrive at 1:45 pm today for your 2 PM appointment.  Should symptoms suddenly change or worsen or you develop severe shortness of breath or chest pain, proceed to the nearest emergency department.

## 2022-10-18 NOTE — Telephone Encounter (Signed)
Error.

## 2023-01-12 ENCOUNTER — Telehealth: Payer: Self-pay | Admitting: Pharmacist

## 2023-01-12 NOTE — Telephone Encounter (Signed)
Received refill request from [facility] for patinet's Esbriet. She has not been since in >6 months. Overde at this point and due for PFTs per last OV note from Dr. Vaughan Browner Labs done in July 2023 and overdue for these  Routing to scheduling tema Knox Saliva, PharmD, MPH, BCPS, CPP Clinical Pharmacist (Rheumatology and Pulmonology)

## 2023-01-15 ENCOUNTER — Telehealth: Payer: Self-pay | Admitting: Pulmonary Disease

## 2023-01-15 DIAGNOSIS — J8410 Pulmonary fibrosis, unspecified: Secondary | ICD-10-CM

## 2023-01-15 NOTE — Telephone Encounter (Signed)
Left voicemail on home phone, unable to leave voicemail on mobile.

## 2023-01-15 NOTE — Telephone Encounter (Signed)
Needs PA on her Espriat. Pls call PT to advise @ # on file. She states "the company is contacting the ([place])" This is a grant so please follow thru, she said.

## 2023-01-15 NOTE — Telephone Encounter (Signed)
Called patient and she states that she is needing a Prior Auth done for her Esbriet. She stating she has one more shipment coming in this week and the company told her she needed a PA. Can we make sure this gets started for this patient.   Thank you

## 2023-01-15 NOTE — Telephone Encounter (Signed)
Refill for Esbriet will be sent after her appt on 02/05/2023  Knox Saliva, PharmD, MPH, BCPS, CPP Clinical Pharmacist (Rheumatology and Pulmonology)

## 2023-01-18 NOTE — Telephone Encounter (Signed)
Patient is scheduled 02/05/2023 with Dr. Vaughan Browner

## 2023-02-05 ENCOUNTER — Encounter: Payer: Self-pay | Admitting: Pulmonary Disease

## 2023-02-05 ENCOUNTER — Ambulatory Visit: Payer: Medicare HMO | Admitting: Pulmonary Disease

## 2023-02-05 VITALS — BP 140/68 | HR 84 | Temp 98.2°F | Ht 62.0 in | Wt 156.0 lb

## 2023-02-05 DIAGNOSIS — R0602 Shortness of breath: Secondary | ICD-10-CM

## 2023-02-05 DIAGNOSIS — Z5181 Encounter for therapeutic drug level monitoring: Secondary | ICD-10-CM

## 2023-02-05 DIAGNOSIS — I272 Pulmonary hypertension, unspecified: Secondary | ICD-10-CM

## 2023-02-05 DIAGNOSIS — J84112 Idiopathic pulmonary fibrosis: Secondary | ICD-10-CM

## 2023-02-05 LAB — COMPREHENSIVE METABOLIC PANEL
ALT: 12 U/L (ref 0–35)
AST: 18 U/L (ref 0–37)
Albumin: 4.1 g/dL (ref 3.5–5.2)
Alkaline Phosphatase: 76 U/L (ref 39–117)
BUN: 9 mg/dL (ref 6–23)
CO2: 30 mEq/L (ref 19–32)
Calcium: 9.8 mg/dL (ref 8.4–10.5)
Chloride: 98 mEq/L (ref 96–112)
Creatinine, Ser: 0.76 mg/dL (ref 0.40–1.20)
GFR: 73.06 mL/min (ref >60.00)
Glucose, Bld: 118 mg/dL — ABNORMAL HIGH (ref 70–99)
Potassium: 3.8 mEq/L (ref 3.5–5.1)
Sodium: 135 mEq/L (ref 135–145)
Total Bilirubin: 0.5 mg/dL (ref 0.2–1.2)
Total Protein: 7 g/dL (ref 6.0–8.3)

## 2023-02-05 MED ORDER — PIRFENIDONE 267 MG PO CAPS: ORAL_CAPSULE | ORAL | 1 refills | Status: DC

## 2023-02-05 NOTE — Progress Notes (Signed)
MARNICE SUDDARTH    AX:[id number]    09/24/1941  Primary Care Physician:Allwardt, Randa Evens, PA-C  Referring Physician: Allwardt, Randa Evens, PA-C [place],  [place] [address]  Chief complaint:  Follow-up for IPF Started Esbriet September 2021  HPI: 82 y.o. with history of hypertension, stroke, sleep apnea. Referred for evaluation of interstitial lung disease This was noted on CT scan done in 2018.  Notes increasing cough, dyspnea over the past few months.  Symptoms worsen after COVID-19 vaccination.  No mucus production, wheezing or chills.  She had an exposure to DDT as a child ( middle School) and had severe respiratory problems ever since, including multiple flu illnesses that would take her 6-8 weeks to recover from.  Finished pulmonary rehab in 2022 [facility] [place] [facility] for second opinion in 2022 She developed COVID-19 in January 2023 and was treated with molnupiravir.  Pets: No pets Occupation: Retired Quarry manager at [employer] Exposures: Reports exposure to DDD as a child and to acids in her lab at work but no known ongoing exposures.  No mold, hot tub, Jacuzzi.  No asbestos, down pillows or comforters Smoking history: 5-pack-year smoker.  Quit in 1971 Travel history: No significant travel history Relevant family history: Grandfather had emphysema.  He was a pipe smoker  Interim history: She has traveled to [place] in 2023 and had a great trip. Overall she reports feeling a little more fatigued, with mild increase in dyspnea and cough. Planning a trip to [place], [place] in April of this year.  Outpatient Encounter Medications as of 02/05/2023  Medication Sig   amLODipine (NORVASC) 10 MG tablet Take 1 tablet (10 mg total) by mouth daily.   b complex vitamins tablet Take 1 tablet by mouth daily.   CALCIUM PO Take by mouth daily.   Cholecalciferol (VITAMIN D-3 PO) Take by mouth daily.   clopidogrel (PLAVIX) 75 MG tablet  TAKE 1 TABLET EVERY DAY   co-enzyme Q-10 30 MG capsule Take 30 mg by mouth 3 (three) times daily.   Collagen Hydrolysate POWD 1 Scoop by Does not apply route daily.   ferrous sulfate 324 MG TBEC Take 324 mg by mouth every other day.   hydrochlorothiazide (MICROZIDE) 12.5 MG capsule TAKE 1 CAPSULE EVERY DAY   LAGEVRIO 200 MG CAPS capsule Take 3 capsules by mouth 2 (two) times daily.   levocetirizine (XYZAL) 5 MG tablet TAKE 1 TABLET EVERY EVENING   LUTEIN PO Take by mouth daily.   montelukast (SINGULAIR) 10 MG tablet TAKE 1 TABLET AT BEDTIME   Multiple Vitamins-Minerals (WOMENS MULTIVITAMIN PO) Take 1 capsule by mouth daily.   oxybutynin (DITROPAN-XL) 10 MG 24 hr tablet TAKE 1 TABLET BY MOUTH Q DAILY   pantoprazole (PROTONIX) 40 MG tablet Take 1 tablet (40 mg total) by mouth 2 (two) times daily.   Pirfenidone (ESBRIET) 267 MG CAPS TAKE 3 CAPSULES BY MOUTH WITH BREAKFAST, WITH LUNCH, AND WITH EVENING MEAL.   potassium chloride (KLOR-CON M) 10 MEQ tablet TAKE 1 TABLET EVERY DAY   simvastatin (ZOCOR) 10 MG tablet TAKE 1 TABLET EVERY DAY AT 6 PM   Turmeric 500 MG CAPS Take 1 capsule by mouth daily.   zinc gluconate 50 MG tablet Take 50 mg by mouth daily.   No facility-administered encounter medications on file as of 02/05/2023.   Physical Exam: Blood pressure (!) 140/68, pulse 84, temperature 98.2 F (36.8 C), temperature source Oral,  height 5' 2"$  (1.575 m), weight 156 lb (70.8 kg), SpO2 95 %. Gen:      No acute distress HEENT:  EOMI, sclera anicteric Neck:     No masses; no thyromegaly Lungs:    Bibasilar crackles CV:         Regular rate and rhythm; no murmurs Abd:      + bowel sounds; soft, non-tender; no palpable masses, no distension Ext:    No edema; adequate peripheral perfusion Skin:      Warm and dry; no rash Neuro: alert and oriented x 3 Psych: normal mood and affect   Data Reviewed: Imaging: High-resolution CT 03/29/2017-basal gradient fibrotic changes with reticulation,  traction bronchiectasis.  Moderate air trapping.  Probable UIP pattern.  High-res CT 04/15/2020-mildly progressive pulmonary fibrosis probable UIP pattern.  High-res CT 04/20/2022-stable pattern of fibrosis in probable UIP pattern. I have reviewed the images personally.  PFTs: 04/17/2017 FVC 1.69 [64%], FEV1 1.45 [73%], F/F 86, TLC 3.72 [76%], DLCO 14.90 [65%]  06/17/2020 FVC 1.72 [68%], FEV1 1.44 [76%], F/F 84, TLC 3.26 [66%], DLCO 14.53 [79%]   07/05/2022 FVC 1.69 [72%], FEV1 1.45 [84%], F/F86, TLC 3.31 [69%], DLCO 13.38 [76%] Mild restriction, minimal diffusion defect  6-minute walk test 06/30/2020- 443 m, nadir O2 sat of 90%  Labs: RAST panel 04/27/2015-IgE 40, sensitive to grass, tree pollen CBC 02/18/2018-WBC 6.2, eos 3.9%, absolute eosinophil count 242  N-terminal proBNP 09/10/2019 1-24  ILD serologies 04/09/2020-atypical p-ANCA 1:80  Sleep: PSG 07/31/17, AHI 6.4, SpO2 low 85%. CPAP titration 11/15/2017 CPAP 7  Cardiac: Echocardiogram 09/02/2021-LVEF 60 to [id number], RV systolic function is normal.  Normal PA systolic pressure.  No evidence of shunt by bubble study  Assessment:  Pulmonary fibrosis Probable UIP pattern.  This is likely IPF with minimal exposures.  Serologies show mild elevation in ANCA which is likely nonspecific with no other signs and symptoms of autoimmune process.  Discussed findings in detail.  To make a diagnosis with certainty we would need a lung biopsy but not recommended given her age. Given mild progression on CT scan and PFTs with worsening symptoms we have decided to treat with antifibrotic Continue Esbriet Check LFTs  As she has increase in dyspnea will reevaluate with high-res CT, PFTs  Mild pulmonary hypertension Noted on echocardiogram in 2021 however repeat study did not show any evidence of pulmonary hypertension.  Continue monitoring. Ordered echocardiogram and proBNP  Chronic cough Secondary to pulmonary fibrosis, GERD Continue PPI,  Tessalon Advised to raise head of the bed and not lay down at least 2 hours after eating Start flutter valve and Mucinex for chest congestion  OSA Stable Download reviewed with good compliance  Plan/Recommendations: - Continue Esbriet, check LFTs, proBNP - Follow-up high-res CT, PFTs - Echocardiogram - Mucinex, flutter valve for chest congestion, cough  Marshell Garfinkel MD [facility] 02/05/2023, 10:04 AM  CC: Allwardt, Randa Evens, PA-C

## 2023-02-05 NOTE — Patient Instructions (Signed)
Will get set of labs today including comprehensive metabolic panel and proBNP for dyspnea Order follow-up high-res CT, PFTs at next available Will also order echocardiogram for evaluation of pulmonary hypertension Use Mucinex and flutter valve for cough, chest congestion Continue supplemental oxygen Continue Esbriet Follow-up in 4 months.

## 2023-02-05 NOTE — Addendum Note (Signed)
Addended by: Cassandria Anger on: 02/05/2023 02:19 PM   Modules accepted: Orders

## 2023-02-05 NOTE — Telephone Encounter (Signed)
Refill sent for ESBRIET to [facility] for Esbriet: [phone]  Dose: 801 mg three times daily  Last OV: 02/05/2023 Provider: Dr. Vaughan Browner  Next OV: 4 months  LFTs 02/05/2023 wnl  Knox Saliva, PharmD, MPH, BCPS Clinical Pharmacist (Rheumatology and Pulmonology)

## 2023-02-06 LAB — PRO B NATRIURETIC PEPTIDE: NT-Pro BNP: <36 pg/mL (ref 0–738)

## 2023-02-20 DIAGNOSIS — H5203 Hypermetropia, bilateral: Secondary | ICD-10-CM | POA: Diagnosis not present

## 2023-02-20 DIAGNOSIS — Z961 Presence of intraocular lens: Secondary | ICD-10-CM | POA: Diagnosis not present

## 2023-02-20 DIAGNOSIS — H16121 Filamentary keratitis, right eye: Secondary | ICD-10-CM | POA: Diagnosis not present

## 2023-02-20 DIAGNOSIS — H43812 Vitreous degeneration, left eye: Secondary | ICD-10-CM | POA: Diagnosis not present

## 2023-02-20 DIAGNOSIS — H35373 Puckering of macula, bilateral: Secondary | ICD-10-CM | POA: Diagnosis not present

## 2023-02-20 DIAGNOSIS — H02839 Dermatochalasis of unspecified eye, unspecified eyelid: Secondary | ICD-10-CM | POA: Diagnosis not present

## 2023-02-23 ENCOUNTER — Emergency Department (HOSPITAL_COMMUNITY): Payer: Medicare HMO

## 2023-02-23 ENCOUNTER — Emergency Department (HOSPITAL_COMMUNITY)
Admission: EM | Admit: 2023-02-23 | Discharge: 2023-02-23 | Disposition: A | Discharge status: Home / Self Care | Payer: Medicare HMO | Attending: Emergency Medicine | Admitting: Emergency Medicine

## 2023-02-23 DIAGNOSIS — S0990XA Unspecified injury of head, initial encounter: Secondary | ICD-10-CM | POA: Diagnosis not present

## 2023-02-23 DIAGNOSIS — I10 Essential (primary) hypertension: Secondary | ICD-10-CM | POA: Diagnosis not present

## 2023-02-23 DIAGNOSIS — R079 Chest pain, unspecified: Secondary | ICD-10-CM | POA: Diagnosis not present

## 2023-02-23 DIAGNOSIS — Z7902 Long term (current) use of antithrombotics/antiplatelets: Secondary | ICD-10-CM | POA: Insufficient documentation

## 2023-02-23 DIAGNOSIS — R58 Hemorrhage, not elsewhere classified: Secondary | ICD-10-CM | POA: Diagnosis not present

## 2023-02-23 DIAGNOSIS — M25561 Pain in right knee: Secondary | ICD-10-CM | POA: Insufficient documentation

## 2023-02-23 DIAGNOSIS — W19XXXA Unspecified fall, initial encounter: Secondary | ICD-10-CM | POA: Diagnosis not present

## 2023-02-23 DIAGNOSIS — Z043 Encounter for examination and observation following other accident: Secondary | ICD-10-CM | POA: Diagnosis not present

## 2023-02-23 DIAGNOSIS — W101XXA Fall (on)(from) sidewalk curb, initial encounter: Secondary | ICD-10-CM | POA: Diagnosis not present

## 2023-02-23 DIAGNOSIS — S01111A Laceration without foreign body of right eyelid and periocular area, initial encounter: Secondary | ICD-10-CM | POA: Insufficient documentation

## 2023-02-23 DIAGNOSIS — Z23 Encounter for immunization: Secondary | ICD-10-CM | POA: Insufficient documentation

## 2023-02-23 DIAGNOSIS — S0101XA Laceration without foreign body of scalp, initial encounter: Secondary | ICD-10-CM

## 2023-02-23 DIAGNOSIS — M47812 Spondylosis without myelopathy or radiculopathy, cervical region: Secondary | ICD-10-CM | POA: Diagnosis not present

## 2023-02-23 MED ORDER — TETANUS-DIPHTH-ACELL PERTUSSIS 5-2.5-18.5 LF-MCG/0.5 IM SUSY
0.5000 mL | PREFILLED_SYRINGE | Freq: Once | INTRAMUSCULAR | Status: AC
  Administered 2023-02-23: 0.5 mL via INTRAMUSCULAR
  Filled 2023-02-23: qty 0.5

## 2023-02-23 MED ORDER — ACETAMINOPHEN 500 MG PO TABS
1000.0000 mg | ORAL_TABLET | Freq: Once | ORAL | Status: AC
  Administered 2023-02-23: 1000 mg via ORAL
  Filled 2023-02-23: qty 2

## 2023-02-23 NOTE — ED Triage Notes (Signed)
PT BIB EMS after fall from ground off of curb when leaving friend's home. Pt on plavix. Lac and edema to right periorbital. Pt reports midsternal chest pressure after fall. PT denies SOB. EMS placed 20 in R AC. A&Ox4, ABCs intact at this time.

## 2023-02-23 NOTE — Discharge Instructions (Addendum)
You have been seen and discharged from the emergency department.  Your eyebrow laceration was repaired with Steri-Strips and skin glue.  Keep this area dry for the next 48 hours.  After that you may shower and let water run over the area, pat to dry.  The skin glue will naturally dissolve and the Steri-Strips will come off by themselves.  Your CT imaging and x-ray imaging was negative.  However you most likely sustained a concussion.  You may experience headache, fatigue, nausea.  You may take Tylenol for headache.  If you have any severe head pain, intractable vomiting or further concerns please return to the emergency department.  Follow-up with your primary provider for further evaluation and further care. Take home medications as prescribed. If you have any worsening symptoms or further concerns for your health please return to an emergency department for further evaluation.

## 2023-02-23 NOTE — Progress Notes (Signed)
   02/23/23 1709  Spiritual Encounters  Type of Visit Initial  Care provided to: Patient  Referral source Trauma page  Reason for visit Trauma  OnCall Visit No  Interventions  Spiritual Care Interventions Made Established relationship of care and support;Compassionate presence;Prayer  Intervention Outcomes  Outcomes Connection to spiritual care;Reduced anxiety;Reduced fear   Chaplain Creedence Heiss responded to level 2 trauma. Patient welcomed chaplain and thanked chaplain for coming. Chaplain provided prayer per patient request. No follow up needed at this time.   Note prepared by Abbott Pao, chaplain resident [phone]

## 2023-02-23 NOTE — ED Provider Notes (Signed)
[facility] Provider Note   CSN: [id number] Arrival date & time: 02/23/23  1656     History  No chief complaint on file.   Maria [name] is a 82 y.o. female.  HPI   82 year old female presents emergency department as a level 2 trauma for fall, head injury on blood thinners.  Patient takes Plavix.  Patient states that she was leaving a friend's house, caught the front toe of her shoe causing her to fall off a curb and hit the right side of her head.  She does not believe that she lost consciousness.  Since the fall she is complaining of right-sided head pain, mild right-sided chest pain and right-sided knee pain.  She is otherwise been compliant with her medication and her baseline health.  Home Medications Prior to Admission medications   Medication Sig Start Date End Date Taking? Authorizing Provider  amLODipine (NORVASC) 10 MG tablet Take 1 tablet (10 mg total) by mouth daily. 06/27/22   Allwardt, Randa Evens, PA-C  b complex vitamins tablet Take 1 tablet by mouth daily.    [provider]  CALCIUM PO Take by mouth daily.    [provider]  Cholecalciferol (VITAMIN D-3 PO) Take by mouth daily.    [provider]  clopidogrel (PLAVIX) 75 MG tablet TAKE 1 TABLET EVERY DAY 04/19/22   Allwardt, [name] M, PA-C  co-enzyme Q-10 30 MG capsule Take 30 mg by mouth 3 (three) times daily.    [provider]  Collagen Hydrolysate POWD 1 Scoop by Does not apply route daily.    [provider]  ferrous sulfate 324 MG TBEC Take 324 mg by mouth every other day.    [provider]  hydrochlorothiazide (MICROZIDE) 12.5 MG capsule TAKE 1 CAPSULE EVERY DAY 04/19/22   Allwardt, [name] M, PA-C  LAGEVRIO 200 MG CAPS capsule Take 3 capsules by mouth 2 (two) times daily. 09/01/22   [provider]  levocetirizine (XYZAL) 5 MG tablet TAKE 1 TABLET EVERY EVENING 04/19/22   Allwardt, [name] M, PA-C  LUTEIN PO  Take by mouth daily.    [provider]  montelukast (SINGULAIR) 10 MG tablet TAKE 1 TABLET AT BEDTIME 04/19/22   Allwardt, [name] M, PA-C  Multiple Vitamins-Minerals (WOMENS MULTIVITAMIN PO) Take 1 capsule by mouth daily.    [provider]  oxybutynin (DITROPAN-XL) 10 MG 24 hr tablet TAKE 1 TABLET BY MOUTH Q DAILY 04/22/22   Allwardt, [name] M, PA-C  pantoprazole (PROTONIX) 40 MG tablet Take 1 tablet (40 mg total) by mouth 2 (two) times daily. 04/22/22   Allwardt, Randa Evens, PA-C  Pirfenidone (ESBRIET) 267 MG CAPS TAKE 3 CAPSULES BY MOUTH WITH BREAKFAST, WITH LUNCH, AND WITH EVENING MEAL. 02/05/23 02/05/24  Marshell Garfinkel, MD  potassium chloride (KLOR-CON M) 10 MEQ tablet TAKE 1 TABLET EVERY DAY 04/19/22   Allwardt, [name] M, PA-C  simvastatin (ZOCOR) 10 MG tablet TAKE 1 TABLET EVERY DAY AT 6 PM 04/19/22   Allwardt, [name] M, PA-C  Turmeric 500 MG CAPS Take 1 capsule by mouth daily.    [provider]  zinc gluconate 50 MG tablet Take 50 mg by mouth daily.    [provider]      Allergies    Amoxicillin, Olmesartan, Ace inhibitors, Bee venom, Benicar [olmesartan medoxomil], Codeine, Covid-19 (mrna) vaccine, Lisinopril, Macrobid [nitrofurantoin monohyd macro], Nutritional supplements, Penicillins, and Shingrix [zoster vac recomb adjuvanted]    Review of Systems  Review of Systems  HENT:  Positive for facial swelling. Negative for ear pain, nosebleeds, trouble swallowing and voice change.   Eyes:  Negative for pain and visual disturbance.  Respiratory:  Negative for shortness of breath.   Cardiovascular:  Positive for chest pain. Negative for palpitations.  Gastrointestinal:  Negative for abdominal pain.  Genitourinary:  Negative for difficulty urinating.  Musculoskeletal:  Negative for back pain and neck pain.       + right knee pain  Skin:  Positive for wound.  Neurological:  Negative for dizziness, weakness, light-headedness, numbness and headaches.   Psychiatric/Behavioral:  Negative for confusion.     Physical Exam Updated Vital Signs BP (!) 146/85   Pulse 72   Temp 97.9 F (36.6 C) (Oral)   Resp (!) 21   SpO2 97%  Physical Exam Vitals and nursing note reviewed.  Constitutional:      General: She is not in acute distress. HENT:     Head: Normocephalic.     Comments: Right eyebrow laceration, bleeding controlled. Midface is stable    Right Ear: External ear normal.     Left Ear: External ear normal.     Nose: Nose normal.  Eyes:     Extraocular Movements: Extraocular movements intact.     Conjunctiva/sclera: Conjunctivae normal.     Pupils: Pupils are equal, round, and reactive to light.  Neck:     Comments: Cervical collar in place Cardiovascular:     Rate and Rhythm: Normal rate.  Pulmonary:     Effort: Pulmonary effort is normal.     Breath sounds: No rales.  Abdominal:     General: Abdomen is flat. There is no distension.     Palpations: Abdomen is soft.     Tenderness: There is no abdominal tenderness.     Comments: No seat belt sign  Musculoskeletal:        General: Tenderness present. No deformity or signs of injury.     Cervical back: No rigidity or tenderness.     Comments: Pelvis is stable, TTP of right lateral knee with mild swelling  Skin:    General: Skin is warm.     Comments: Right eyebrow laceration, bleeding controlled  Neurological:     Mental Status: She is alert and oriented to person, place, and time. Mental status is at baseline.     ED Results / Procedures / Treatments   Labs (all labs ordered are listed, but only abnormal results are displayed) Labs Reviewed - No data to display  EKG None  Radiology No results found.  Procedures .Marland KitchenLaceration Repair  Date/Time: 02/23/2023 10:07 PM  Performed by: Lorelle Gibbs, DO Authorized by: Lorelle Gibbs, DO   Consent:    Consent obtained:  Verbal   Risks discussed:  Infection, need for additional repair, poor cosmetic result  and poor wound healing   Alternatives discussed:  No treatment Anesthesia:    Anesthesia method:  None Laceration details:    Location:  Face   Face location:  R eyebrow   Length (cm):  1 Exploration:    Hemostasis achieved with:  Direct pressure Treatment:    Amount of cleaning:  Extensive   Irrigation solution:  Sterile saline   Irrigation method:  Pressure wash and syringe   Debridement:  None Skin repair:    Repair method:  Steri-Strips and tissue adhesive   Number of Steri-Strips:  2 Repair type:    Repair type:  Simple Post-procedure details:  Dressing:  Open (no dressing)   Procedure completion:  Tolerated .Critical Care  Performed by: Lorelle Gibbs, DO Authorized by: Lorelle Gibbs, DO   Critical care provider statement:    Critical care time (minutes):  30   Critical care was necessary to treat or prevent imminent or life-threatening deterioration of the following conditions:  Trauma   Critical care was time spent personally by me on the following activities:  Development of treatment plan with patient or surrogate, discussions with consultants, evaluation of patient's response to treatment, examination of patient, ordering and review of laboratory studies, ordering and review of radiographic studies, ordering and performing treatments and interventions, pulse oximetry, re-evaluation of patient's condition and review of old charts   I assumed direction of critical care for this patient from another provider in my specialty: no       Medications Ordered in ED Medications - No data to display  ED Course/ Medical Decision Making/ A&P                             Medical Decision Making Amount and/or Complexity of Data Reviewed Radiology: ordered.  Risk OTC drugs. Prescription drug management.   82 year old female presents as a fall, head injury on Plavix.  Patient had a mechanical fall with head injury, no LOC.  No syncopal symptoms.  Currently  complaining of right sided headache and right knee pain.  Patient was hypertensive on arrival but this is naturally downtrended.  CT of the head and neck showed no acute finding.  X-ray showed no acute fracture.  Patient has a small laceration just above her right eyebrow.  This was repaired with Steri-Strips and Dermabond.  Bleeding controlled.  Tetanus will be updated.  Patient will take Tylenol for pain.  Patient educated on further swelling/bruising around the right eye.  Patient at this time appears safe and stable for discharge and close outpatient follow up. Discharge plan and strict return to ED precautions discussed, patient verbalizes understanding and agreement.        Final Clinical Impression(s) / ED Diagnoses Final diagnoses:  None    Rx / DC Orders ED Discharge Orders     None         Lorelle Gibbs, DO 02/23/23 2209

## 2023-02-23 NOTE — ED Notes (Addendum)
Trauma Response Nurse Documentation  Maria [name] is a 82 y.o. female arriving to [facility] ED via EMS  On clopidogrel 75 mg daily. Trauma was activated as a Level 2 based on the following trauma criteria Elderly patients > 65 with head trauma on anti-coagulation (excluding ASA). Trauma team at the bedside on patient arrival.   Patient cleared for CT by Dr. Dina Rich. Pt transported to CT with trauma response nurse present to monitor. RN remained with the patient throughout their absence from the department for clinical observation. GCS 15.  History   Past Medical History:  Diagnosis Date   Anemia    Arthritis    Bronchitis    Carpal tunnel syndrome    in calves neuropathy   Diverticulosis    GERD (gastroesophageal reflux disease)    Headache(784.0)    chronic   High cholesterol    Hyperlipidemia    Hypertension    Migraine    Nephritis    OSA (obstructive sleep apnea)    Palpitations    Personal history of colonic adenomas 09/15/2010   Stroke ([place])    TIA (transient ischemic attack)    Vertigo, benign paroxysmal      Past Surgical History:  Procedure Laterality Date   BLADDER REPAIR     BREAST BIOPSY     rt benign cyst   BREAST EXCISIONAL BIOPSY Right    CARPAL TUNNEL RELEASE     x 2   COLONOSCOPY     REPLACEMENT TOTAL KNEE BILATERAL     TONSILLECTOMY     UPPER GASTROINTESTINAL ENDOSCOPY       Initial Focused Assessment (If applicable, or please see trauma documentation): Patient A&Ox4, GCS 15, PERR 3 Airway intact, bilateral breath sounds Chest tenderness after fall, towards the middle Has PF, sees [facility] and wears oxygen PRN baseline Small hematoma/laceration to right eyebrow Abrasion to right knee  CT's Completed:   CT Head and CT C-Spine   Interventions:  IV, labs CXR/R knee XR CT Head/Cspine Denied need for pain meds Patient immunization record showing Tdap 2015, patient counseled on updating Tdap, unsure is she wants to receive one  today  Plan for disposition:  Discharge home   Event Summary: Patient to ED after tripping on a curb, falling on hard pavement/gravel.  No LOC, GCS 15. Small laceration/hematoma to right eyebrow, abrasion to right knee. Discomfort to center chest, right knee. Imaging negative for any acute traumatic injury. Patient counseling on Tdap. Friend called by patient and is at bedside.  Bedside handoff with ED RN Duanne Guess.    Park Pope Chesky Heyer  Trauma Response RN  Please call TRN at [phone] for further assistance.

## 2023-02-23 NOTE — ED Notes (Signed)
Patient transported to CT 

## 2023-02-26 ENCOUNTER — Telehealth: Payer: Self-pay

## 2023-02-26 NOTE — Transitions of Care (Post Inpatient/ED Visit) (Signed)
   02/26/2023  Name: Maria [name] MRN: [id number] DOB: August 09, 1941  Today's TOC FU Call Status: Today's TOC FU Call Status:: Successful TOC FU Call Competed TOC FU Call Complete Date: 02/26/23  Transition Care Management Follow-up Telephone Call Date of Discharge: 02/23/23 Discharge Facility: [facility]) Type of Discharge: Emergency Department Reason for ED Visit: Other: ("fall,injury of head") How have you been since you were released from the hospital?: Better (Patient voices that she is doing well. She has been up walking without any issues. She drove today for the first time. Denies any pain. She has some bruising to affected area. States steri-strips are in place to lacerated area.) Any questions or concerns?: Yes Patient Questions/Concerns:: Patient wanting to know if she can put "arnica cream' to bruised area Patient Questions/Concerns Addressed: Other: (Patient will discuss with provider during follow up appt tomorrow)  Red on EMMI-ED Discharge Alert Date & Reason:310/24-"Scheduled follow-up appt? No" Confirmed with patient she has follow up appt scheduled with provider on tomorrow.  Items Reviewed: Did you receive and understand the discharge instructions provided?: Yes Medications obtained and verified?: Yes (Medications Reviewed) Any new allergies since your discharge?: No Dietary orders reviewed?: NA Do you have support at home?: Yes  Home Care and Equipment/Supplies: [place] Ordered?: NA Any new equipment or medical supplies ordered?: NA  Functional Questionnaire: Do you need assistance with bathing/showering or dressing?: No Do you need assistance with meal preparation?: No Do you have difficulty maintaining continence: No Do you need assistance with getting out of bed/getting out of a chair/moving?: No Do you have difficulty managing or taking your medications?: No  Folllow up appointments reviewed: PCP Follow-up appointment confirmed?:  Yes Date of PCP follow-up appointment?: 02/27/23 Follow-up Provider: [facility] Follow-up appointment confirmed?: NA Do you need transportation to your follow-up appointment?: No Do you understand care options if your condition(s) worsen?: Yes-patient verbalized understanding  SDOH Interventions Today    Flowsheet Row Most Recent Value  SDOH Interventions   Food Insecurity Interventions Intervention Not Indicated  Transportation Interventions Intervention Not Indicated      TOC Interventions Today    Flowsheet Row Most Recent Value  TOC Interventions   TOC Interventions Discussed/Reviewed TOC Interventions Discussed, S/S of infection, Post op wound/incision care      Interventions Today    Flowsheet Row Most Recent Value  Education Interventions   Education Provided Provided Education  [pain mgmt, fall and safety measures]  Provided Verbal Education On When to see the doctor  Pharmacy Interventions   Pharmacy Dicussed/Reviewed Pharmacy Topics Discussed, Medications and their functions  Safety Interventions   Safety Discussed/Reviewed Safety Discussed, Fall Risk       Hetty Blend [facility] Care Management Care Management Community Coordinator Direct Phone: [phone] Toll Free: [phone] Fax: [phone]

## 2023-02-27 ENCOUNTER — Ambulatory Visit (INDEPENDENT_AMBULATORY_CARE_PROVIDER_SITE_OTHER): Payer: Medicare HMO | Admitting: Physician Assistant

## 2023-02-27 ENCOUNTER — Telehealth: Payer: Self-pay | Admitting: Physician Assistant

## 2023-02-27 VITALS — BP 146/82 | HR 70 | Temp 97.8°F | Ht 62.0 in | Wt 155.8 lb

## 2023-02-27 DIAGNOSIS — R011 Cardiac murmur, unspecified: Secondary | ICD-10-CM

## 2023-02-27 DIAGNOSIS — I10 Essential (primary) hypertension: Secondary | ICD-10-CM

## 2023-02-27 DIAGNOSIS — W19XXXS Unspecified fall, sequela: Secondary | ICD-10-CM | POA: Diagnosis not present

## 2023-02-27 NOTE — Telephone Encounter (Signed)
Please see pt msg and advise 

## 2023-02-27 NOTE — Telephone Encounter (Signed)
Pt called and states she would like to know if she needed to get rehab for her concussion. Please advise.

## 2023-02-27 NOTE — Progress Notes (Signed)
Subjective:    Patient ID: Maria [name], female    DOB: Jul 10, 1941, 82 y.o.   MRN: [id number]  Chief Complaint  Patient presents with   Hospitalization Follow-up    Pt in office for hospital f/u with PCP; pt states still sore and has some bruising;     HPI Patient is in today for ED follow-up from 02/23/23.  Maria [name] ultimately had a head injury after accidental trip and fall.  Maria [name] did not lose consciousness.  Her CT of the head and neck were negative.  Small laceration right eyebrow was repaired with Steri-Strips and Dermabond.  Maria [name] was discharged in stable condition.  Pain today 1-2/10. No headaches. Maria [name] has not needed any Tylenol. Sometimes dizzy upon standing or waking up, but says this has been baseline for a long-time for her, no changes to this status. No nausea or vomiting.   Past Medical History:  Diagnosis Date   Anemia    Arthritis    Bronchitis    Carpal tunnel syndrome    in calves neuropathy   Diverticulosis    GERD (gastroesophageal reflux disease)    Headache(784.0)    chronic   High cholesterol    Hyperlipidemia    Hypertension    Migraine    Nephritis    OSA (obstructive sleep apnea)    Palpitations    Personal history of colonic adenomas 09/15/2010   Stroke ([place])    TIA (transient ischemic attack)    Vertigo, benign paroxysmal     Past Surgical History:  Procedure Laterality Date   BLADDER REPAIR     BREAST BIOPSY     rt benign cyst   BREAST EXCISIONAL BIOPSY Right    CARPAL TUNNEL RELEASE     x 2   COLONOSCOPY     REPLACEMENT TOTAL KNEE BILATERAL     TONSILLECTOMY     UPPER GASTROINTESTINAL ENDOSCOPY      Family History  Problem Relation Age of Onset   Diabetes Father    Aneurysm Father    Kidney disease Father    Hypertension Father    Heart disease Mother    Colon polyps Mother    Chronic bronchitis Mother    Diabetes Brother    Colon cancer Maternal Grandmother    Irritable bowel syndrome Daughter    Emphysema Maternal  Grandfather        smoked a pipe    Social History   Tobacco Use   Smoking status: Former    Packs/day: 1.00    Years: 10.00    Additional pack years: 0.00    Total pack years: 10.00    Types: Cigarettes    Quit date: 12/18/1969    Years since quitting: 53.2    Passive exposure: Past   Smokeless tobacco: Never  Vaping Use   Vaping Use: Never used  Substance Use Topics   Alcohol use: Yes    Comment: ocassional use   Drug use: No     Allergies  Allergen Reactions   Amoxicillin Hives   Olmesartan Hives   Ace Inhibitors Other (See Comments)    unknown   Bee Venom    Benicar [Olmesartan Medoxomil] Other (See Comments)    Tongue swelling   Codeine     REACTION: nausea   Covid-19 (Mrna) Vaccine     Increased shortness of Breath in Fibrosis   Lisinopril Other (See Comments)    unknown   Macrobid [Nitrofurantoin Monohyd Macro]     Pulmonary Fibrosis  Nutritional Supplements     Bee stings cause angioedema-carry an EPIPEN   Penicillins    Shingrix [Zoster Vac Recomb Adjuvanted] Other (See Comments)    Chills, feverish, nausea x 4 days    Review of Systems NEGATIVE UNLESS OTHERWISE INDICATED IN HPI      Objective:     BP (!) 146/82 (BP Location: Left Arm)   Pulse 70   Temp 97.8 F (36.6 C) (Temporal)   Ht 5\' 2"  (1.575 m)   Wt 155 lb 12.8 oz (70.7 kg)   SpO2 99%   BMI 28.50 kg/m   Wt Readings from Last 3 Encounters:  02/27/23 155 lb 12.8 oz (70.7 kg)  02/23/23 156 lb 1.4 oz (70.8 kg)  02/05/23 156 lb (70.8 kg)    BP Readings from Last 3 Encounters:  02/27/23 (!) 146/82  02/23/23 (!) 141/79  02/05/23 (!) 140/68     Physical Exam Vitals and nursing note reviewed.  Constitutional:      Appearance: Normal appearance.  Eyes:     Extraocular Movements: Extraocular movements intact.     Conjunctiva/sclera: Conjunctivae normal.     Pupils: Pupils are equal, round, and reactive to light.  Cardiovascular:     Rate and Rhythm: Normal rate and regular  rhythm.     Pulses: Normal pulses.     Heart sounds: Murmur (2/6 RUSB) heard.  Pulmonary:     Effort: Pulmonary effort is normal.     Breath sounds: Normal breath sounds.  Skin:    Comments: Yellow-green ecchymosis and small abrasion with two Steri-Strips above the right eyebrow.  Neurological:     General: No focal deficit present.     Mental Status: Maria [name] is alert and oriented to person, place, and time.  Psychiatric:        Mood and Affect: Mood normal.        Behavior: Behavior normal.        Assessment & Plan:  Fall, sequela  Essential hypertension   I personally reviewed patient's ED report from 02/23/2023, imaging reports including right knee x-ray, chest x-ray, CT head, CT neck.  I am glad to see that Maria [name] is doing so well and continues to recover from this accidental fall.  Discussed fall precautions for the future.  Maria [name] has no signs of concussion and no red flags indicating the need for reimaging.  I encouraged her to continue to take it slow, take Tylenol as needed, use ice over the area of pain and swelling where her small abrasion is located.  Maria [name] will return if any sudden change in symptoms such as severe headaches, nausea or vomiting, dizziness, etc.  ED precautions also discussed with her.   Blood pressure readings have been elevated.  Maria [name] has a history of hypertension.  Plan to have her continue on amlodipine 10 mg, hydrochlorothiazide 12.5 mg.  I have asked her to monitor readings at home.  Bring log with her to next appointment and we can adjust medication if needed.    Return recheck as scheduled to look at blood pressure readings.  This note was prepared with assistance of Systems analyst. Occasional wrong-word or sound-a-like substitutions may have occurred due to the inherent limitations of voice recognition software.    Keshonda Monsour M Quintasha Gren, PA-C

## 2023-02-28 ENCOUNTER — Telehealth: Payer: Self-pay

## 2023-02-28 NOTE — Telephone Encounter (Signed)
     Patient  visit on 3/8  at [facility]    Have you been able to follow up with your primary care physician? Yes   The patient was or was not able to obtain any needed medicine or equipment. NA  Are there diet recommendations that you are having difficulty following? NA    Patient expresses understanding of discharge instructions and education provided has no other needs at this time. [place] [place] [phone] [address], [place], [place] [address] Phone: [phone] Email: [email or web address]

## 2023-03-01 ENCOUNTER — Encounter: Payer: Self-pay | Admitting: *Deleted

## 2023-03-02 ENCOUNTER — Ambulatory Visit (HOSPITAL_COMMUNITY): Payer: Medicare HMO | Attending: Internal Medicine

## 2023-03-02 DIAGNOSIS — I272 Pulmonary hypertension, unspecified: Secondary | ICD-10-CM

## 2023-03-02 LAB — ECHOCARDIOGRAM COMPLETE
Area-P 1/2: 3.17 cm2
P 1/2 time: 472 msec
S' Lateral: 2.8 cm

## 2023-03-15 DIAGNOSIS — G4733 Obstructive sleep apnea (adult) (pediatric): Secondary | ICD-10-CM | POA: Diagnosis not present

## 2023-04-23 ENCOUNTER — Ambulatory Visit (INDEPENDENT_AMBULATORY_CARE_PROVIDER_SITE_OTHER): Payer: Medicare HMO

## 2023-04-23 ENCOUNTER — Ambulatory Visit (INDEPENDENT_AMBULATORY_CARE_PROVIDER_SITE_OTHER): Payer: Medicare HMO | Admitting: Physician Assistant

## 2023-04-23 VITALS — BP 130/82 | HR 80 | Temp 98.2°F | Ht 62.0 in | Wt 155.0 lb

## 2023-04-23 VITALS — BP 136/78 | HR 92 | Resp 95 | Wt 155.0 lb

## 2023-04-23 DIAGNOSIS — J84112 Idiopathic pulmonary fibrosis: Secondary | ICD-10-CM

## 2023-04-23 DIAGNOSIS — Z87898 Personal history of other specified conditions: Secondary | ICD-10-CM | POA: Diagnosis not present

## 2023-04-23 DIAGNOSIS — I10 Essential (primary) hypertension: Secondary | ICD-10-CM

## 2023-04-23 DIAGNOSIS — Z0000 Encounter for general adult medical examination without abnormal findings: Secondary | ICD-10-CM

## 2023-04-23 LAB — POCT GLYCOSYLATED HEMOGLOBIN (HGB A1C): HbA1c POC (<> result, manual entry): 5.6 % (ref 4.0–5.6)

## 2023-04-23 NOTE — Progress Notes (Unsigned)
Subjective:    Patient ID: Maria [name], female    DOB: [birth year], 82 y.o.   MRN: [id number]  Chief Complaint  Patient presents with   Medical Management of Chronic Issues    Pt f/u on BP and been monitoring, doing a lot better and getting normal readings; pt has concerns with not seeing last labs and curious to know what her A1C is; after eating she feels sleepy and dizzy and sometimes nauseous.     HPI Patient is in today for medication management / follow-up on blood pressure readings.  See A/P.  Past Medical History:  Diagnosis Date   Anemia    Arthritis    Bronchitis    Carpal tunnel syndrome    in calves neuropathy   Diverticulosis    GERD (gastroesophageal reflux disease)    Headache(784.0)    chronic   High cholesterol    Hyperlipidemia    Hypertension    Migraine    Nephritis    OSA (obstructive sleep apnea)    Palpitations    Personal history of colonic adenomas 09/15/2010   Stroke (HCC)    TIA (transient ischemic attack)    Vertigo, benign paroxysmal     Past Surgical History:  Procedure Laterality Date   BLADDER REPAIR     BREAST BIOPSY     rt benign cyst   BREAST EXCISIONAL BIOPSY Right    CARPAL TUNNEL RELEASE     x 2   COLONOSCOPY     REPLACEMENT TOTAL KNEE BILATERAL     TONSILLECTOMY     UPPER GASTROINTESTINAL ENDOSCOPY      Family History  Problem Relation Age of Onset   Diabetes Father    Aneurysm Father    Kidney disease Father    Hypertension Father    Heart disease Mother    Colon polyps Mother    Chronic bronchitis Mother    Diabetes Brother    Colon cancer Maternal Grandmother    Irritable bowel syndrome Daughter    Emphysema Maternal Grandfather        smoked a pipe    Social History   Tobacco Use   Smoking status: Former    Packs/day: 1.00    Years: 10.00    Additional pack years: 0.00    Total pack years: 10.00    Types: Cigarettes    Quit date: 12/18/1969    Years since quitting: 53.3    Passive exposure:  Past   Smokeless tobacco: Never  Vaping Use   Vaping Use: Never used  Substance Use Topics   Alcohol use: Yes    Comment: ocassional use   Drug use: No     Allergies  Allergen Reactions   Amoxicillin Hives   Olmesartan Hives   Ace Inhibitors Other (See Comments)    unknown   Bee Venom    Benicar [Olmesartan Medoxomil] Other (See Comments)    Tongue swelling   Codeine     REACTION: nausea   Covid-19 (Mrna) Vaccine     Increased shortness of Breath in Fibrosis   Lisinopril Other (See Comments)    unknown   Macrobid [Nitrofurantoin Monohyd Macro]     Pulmonary Fibrosis   Nutritional Supplements     Bee stings cause angioedema-carry an EPIPEN   Penicillins    Shingrix [Zoster Vac Recomb Adjuvanted] Other (See Comments)    Chills, feverish, nausea x 4 days    Review of Systems NEGATIVE UNLESS OTHERWISE INDICATED IN HPI  Objective:     BP 130/82 (BP Location: Left Arm)   Pulse 80   Temp 98.2 F (36.8 C) (Oral)   Ht 5\' 2"  (1.575 m)   Wt 155 lb (70.3 kg)   SpO2 97%   BMI 28.35 kg/m   Wt Readings from Last 3 Encounters:  04/23/23 155 lb (70.3 kg)  04/23/23 155 lb (70.3 kg)  02/27/23 155 lb 12.8 oz (70.7 kg)    BP Readings from Last 3 Encounters:  04/23/23 130/82  04/23/23 136/78  02/27/23 (!) 146/82     Physical Exam Vitals and nursing note reviewed.  Constitutional:      Appearance: Normal appearance.  Eyes:     Extraocular Movements: Extraocular movements intact.     Conjunctiva/sclera: Conjunctivae normal.     Pupils: Pupils are equal, round, and reactive to light.  Cardiovascular:     Rate and Rhythm: Normal rate and regular rhythm.     Pulses: Normal pulses.     Heart sounds: Murmur (2/6 RUSB) heard.  Pulmonary:     Effort: Pulmonary effort is normal.     Breath sounds: Normal breath sounds.  Musculoskeletal:     Right lower leg: No edema.     Left lower leg: No edema.  Skin:    Findings: No lesion or rash.  Neurological:      General: No focal deficit present.     Mental Status: She is alert and oriented to person, place, and time.  Psychiatric:        Mood and Affect: Mood normal.        Behavior: Behavior normal.        Assessment & Plan:  Essential hypertension Assessment & Plan: Stable, normotensive; she has a log from home readings and these are trending normal as well. She will continue on norvasc10 mg, HCTZ 12.5 mg daily    History of prediabetes Assessment & Plan: Lab Results  Component Value Date   HGBA1C 5.6 04/23/2023   HGBA1C 5.4 07/05/2022   HGBA1C 5.7 04/16/2020   Reassured normal, doing great, continue good lifestyle control.   Orders: -     POCT glycosylated hemoglobin (Hb A1C)  IPF (idiopathic pulmonary fibrosis) (HCC) Assessment & Plan: -Clinically stable. Follows with Dr. Isaiah Serge  -In August, will be off [employer] for IPF, due to grant running out.         Return in about 6 months (around 10/24/2023) for recheck/follow-up, fasting labs .      Jaymes Revels M Mickeal Daws, PA-C

## 2023-04-23 NOTE — Assessment & Plan Note (Signed)
-  Clinically stable. Follows with Dr. Isaiah Serge  -In August, will be off [employer] for IPF, due to grant running out.

## 2023-04-23 NOTE — Progress Notes (Addendum)
Subjective:   Maria [name] is a 82 y.o. female who presents for Medicare Annual (Subsequent) preventive examination.  Review of Systems     Cardiac Risk Factors include: advanced age (>59men, >84 women);dyslipidemia;hypertension     Objective:    Today's Vitals   04/23/23 0803  Weight: 155 lb (70.3 kg)   Body mass index is 28.35 kg/m.     04/23/2023    8:21 AM 02/23/2023    5:17 PM 04/17/2022    8:17 AM 08/25/2020   12:19 PM 08/08/2019   12:08 PM 05/30/2018    3:22 PM 11/16/2017   12:24 AM  Advanced Directives  Does Patient Have a Medical Advance Directive? Yes No Yes Yes Yes Yes No  Type of Estate agent of [place];Living will  Healthcare Power of [employer] of [place];Living will Healthcare Power of [place];Living will   Does patient want to make changes to medical advance directive?    No - Patient declined     Copy of Healthcare Power of Attorney in Chart? No - copy requested  No - copy requested  No - copy requested No - copy requested   Would patient like information on creating a medical advance directive?       No - Patient declined    Current Medications (verified) Outpatient Encounter Medications as of 04/23/2023  Medication Sig   amLODipine (NORVASC) 10 MG tablet Take 1 tablet (10 mg total) by mouth daily.   CALCIUM PO Take by mouth daily.   Cholecalciferol (VITAMIN D-3 PO) Take by mouth daily.   clopidogrel (PLAVIX) 75 MG tablet TAKE 1 TABLET EVERY DAY   co-enzyme Q-10 30 MG capsule Take 30 mg by mouth 3 (three) times daily.   Collagen Hydrolysate POWD 1 Scoop by Does not apply route daily.   Cyanocobalamin (VITAMIN B 12 PO) Take by mouth.   ferrous sulfate 324 MG TBEC Take 324 mg by mouth every other day.   hydrochlorothiazide (MICROZIDE) 12.5 MG capsule TAKE 1 CAPSULE EVERY DAY   levocetirizine (XYZAL) 5 MG tablet TAKE 1 TABLET EVERY EVENING   montelukast (SINGULAIR) 10 MG tablet TAKE 1 TABLET AT BEDTIME   Multiple  Vitamin (VITAMIN E/FOLIC ACID/B-6/B-12 PO) Take by mouth. Vit  b 6   Multiple Vitamins-Minerals (PRESERVISION AREDS 2 PO) Take by mouth.   oxybutynin (DITROPAN-XL) 10 MG 24 hr tablet TAKE 1 TABLET BY MOUTH Q DAILY   pantoprazole (PROTONIX) 40 MG tablet Take 1 tablet (40 mg total) by mouth 2 (two) times daily.   Pirfenidone (ESBRIET) 267 MG CAPS TAKE 3 CAPSULES BY MOUTH WITH BREAKFAST, WITH LUNCH, AND WITH EVENING MEAL.   potassium chloride (KLOR-CON M) 10 MEQ tablet TAKE 1 TABLET EVERY DAY   simvastatin (ZOCOR) 10 MG tablet TAKE 1 TABLET EVERY DAY AT 6 PM   LUTEIN PO Take by mouth daily. (Patient not taking: Reported on 04/23/2023)   [EXPIRED] trimethoprim (TRIMPEX) 100 MG tablet Take 1 tablet (100 mg total) by mouth daily.   [DISCONTINUED] b complex vitamins tablet Take 1 tablet by mouth daily.   [DISCONTINUED] LAGEVRIO 200 MG CAPS capsule Take 3 capsules by mouth 2 (two) times daily.   [DISCONTINUED] Multiple Vitamins-Minerals (WOMENS MULTIVITAMIN PO) Take 1 capsule by mouth daily.   [DISCONTINUED] Pirfenidone (ESBRIET) 267 MG CAPS TAKE 3 CAPSULES BY MOUTH WITH BREAKFAST, WITH LUNCH, AND WITH EVENING MEAL.   [DISCONTINUED] Turmeric 500 MG CAPS Take 1 capsule by mouth daily.   [DISCONTINUED] zinc gluconate 50  MG tablet Take 50 mg by mouth daily.   No facility-administered encounter medications on file as of 04/23/2023.    Allergies (verified) Amoxicillin, Olmesartan, Ace inhibitors, Bee venom, Benicar [olmesartan medoxomil], Codeine, Covid-19 (mrna) vaccine, Lisinopril, Macrobid [nitrofurantoin monohyd macro], Nutritional supplements, Penicillins, and Shingrix [zoster vac recomb adjuvanted]   History: Past Medical History:  Diagnosis Date   Anemia    Arthritis    Bronchitis    Carpal tunnel syndrome    in calves neuropathy   Diverticulosis    GERD (gastroesophageal reflux disease)    Headache(784.0)    chronic   High cholesterol    Hyperlipidemia    Hypertension    Migraine     Nephritis    OSA (obstructive sleep apnea)    Palpitations    Personal history of colonic adenomas 09/15/2010   Stroke (HCC)    TIA (transient ischemic attack)    Vertigo, benign paroxysmal    Past Surgical History:  Procedure Laterality Date   BLADDER REPAIR     BREAST BIOPSY     rt benign cyst   BREAST EXCISIONAL BIOPSY Right    CARPAL TUNNEL RELEASE     x 2   COLONOSCOPY     REPLACEMENT TOTAL KNEE BILATERAL     TONSILLECTOMY     UPPER GASTROINTESTINAL ENDOSCOPY     Family History  Problem Relation Age of Onset   Diabetes Father    Aneurysm Father    Kidney disease Father    Hypertension Father    Heart disease Mother    Colon polyps Mother    Chronic bronchitis Mother    Diabetes Brother    Colon cancer Maternal Grandmother    Irritable bowel syndrome Daughter    Emphysema Maternal Grandfather        smoked a pipe   Social History   Socioeconomic History   Marital status: Divorced    Spouse name: Not on file   Number of children: 3   Years of education: Not on file   Highest education level: Not on file  Occupational History   Occupation: retired  Tobacco Use   Smoking status: Former    Packs/day: 1.00    Years: 10.00    Additional pack years: 0.00    Total pack years: 10.00    Types: Cigarettes    Quit date: 12/18/1969    Years since quitting: 53.3    Passive exposure: Past   Smokeless tobacco: Never  Vaping Use   Vaping Use: Never used  Substance and Sexual Activity   Alcohol use: Yes    Comment: ocassional use   Drug use: No   Sexual activity: Not Currently  Other Topics Concern   Not on file  Social History Narrative   Retired, divorced 3 kids   Former smoker   Occ EtOH   No drugs   Social Determinants of Health   Financial Resource Strain: Low Risk  (04/23/2023)   Overall Financial Resource Strain (CARDIA)    Difficulty of Paying Living Expenses: Not hard at all  Food Insecurity: No Food Insecurity (04/23/2023)   Hunger Vital Sign     Worried About Running Out of Food in the Last Year: Never true    Ran Out of Food in the Last Year: Never true  Transportation Needs: No Transportation Needs (04/23/2023)   PRAPARE - Administrator, Civil Service (Medical): No    Lack of Transportation (Non-Medical): No  Physical Activity: Sufficiently Active (04/23/2023)  Exercise Vital Sign    Days of Exercise per Week: 4 days    Minutes of Exercise per Session: 60 min  Stress: Stress Concern Present (04/23/2023)   [employer] of Occupational Health - Occupational Stress Questionnaire    Feeling of Stress : Rather much  Social Connections: Moderately Integrated (04/23/2023)   Social Connection and Isolation Panel [NHANES]    Frequency of Communication with Friends and Family: More than three times a week    Frequency of Social Gatherings with Friends and Family: More than three times a week    Attends Religious Services: More than 4 times per year    Active Member of [employer] or Organizations: Yes    Attends Banker Meetings: 1 to 4 times per year    Marital Status: Divorced    Tobacco Counseling Counseling given: Not Answered   Clinical Intake:  Pre-visit preparation completed: Yes  Pain : No/denies pain     BMI - recorded: 28.35 Nutritional Status: BMI 25 -29 Overweight Nutritional Risks: None Diabetes: No  How often do you need to have someone help you when you read instructions, pamphlets, or other written materials from your doctor or pharmacy?: 1 - Never  Diabetic?no  Interpreter Needed?: No  Information entered by :: Lanier Ensign, LPN   Activities of Daily Living    04/23/2023    8:23 AM  In your present state of health, do you have any difficulty performing the following activities:  Hearing? 0  Vision? 0  Difficulty concentrating or making decisions? 0  Walking or climbing stairs? 0  Dressing or bathing? 0  Doing errands, shopping? 0  Preparing Food and eating ? N  Using the  Toilet? N  In the past six months, have you accidently leaked urine? Y  Comment wears a pad  Do you have problems with loss of bowel control? N  Managing your Medications? N  Managing your Finances? N  Housekeeping or managing your Housekeeping? N    Patient Care Team: Allwardt, Crist Infante, PA-C as PCP - General (Physician Assistant) Waldon Reining, MD (Orthopedic Surgery) Alfredo Martinez, MD as Consulting Physician (Urology)  Indicate any recent Medical Services you may have received from other than [facility] providers in the past year (date may be approximate).     Assessment:   This is a routine wellness examination for Delayni.  Hearing/Vision screen Hearing Screening - Comments:: Pt denies any hearing issues  Vision Screening - Comments:: Pt follows up with [facility]   Dietary issues and exercise activities discussed: Current Exercise Habits: Home exercise routine, Type of exercise: yoga;walking;Other - see comments (line dance and hike), Time (Minutes): > 60, Frequency (Times/Week): 4, Weekly Exercise (Minutes/Week): 0   Goals Addressed             This Visit's Progress    Patient Stated       None at this time        Depression Screen    04/23/2023    8:14 AM 02/27/2023    1:01 PM 04/17/2022    8:15 AM 04/21/2021   11:12 AM 02/18/2021    9:38 AM 08/25/2020   12:17 PM 08/05/2020    1:32 PM  PHQ 2/9 Scores  PHQ - 2 Score 0 1 0 0 0 0 0  PHQ- 9 Score 0 2  0       Fall Risk    04/23/2023    8:22 AM 02/27/2023   12:57 PM  04/17/2022    8:18 AM 01/17/2022   11:09 AM 08/05/2020    1:32 PM  Fall Risk   Falls in the past year? 1  0 0 0  Number falls in past yr: 1 0 0 0 0  Injury with Fall? 1 1 0 0 0  Comment head and over eye      Risk for fall due to : Impaired vision;Impaired balance/gait History of fall(s) Impaired vision  No Fall Risks  Follow up Falls prevention discussed Falls evaluation completed Falls prevention discussed  Falls evaluation completed     FALL RISK PREVENTION PERTAINING TO THE HOME:  Any stairs in or around the home? No  If so, are there any without handrails? No  Home free of loose throw rugs in walkways, pet beds, electrical cords, etc? Yes  Adequate lighting in your home to reduce risk of falls? Yes   ASSISTIVE DEVICES UTILIZED TO PREVENT FALLS:  Life alert? No  Use of a cane, walker or w/c? No  Grab bars in the bathroom? No  Shower chair or bench in shower? No  Elevated toilet seat or a handicapped toilet? No   TIMED UP AND GO:  Was the test performed? No .   Cognitive Function:        04/23/2023    8:24 AM 04/17/2022    8:19 AM 08/08/2019   12:12 PM  6CIT Screen  What Year? 0 points 0 points 0 points  What month? 0 points 0 points 0 points  What time? 0 points 0 points 0 points  Count back from 20 0 points 0 points 0 points  Months in reverse 0 points 0 points 0 points  Repeat phrase 0 points 0 points 0 points  Total Score 0 points 0 points 0 points    Immunizations Immunization History  Administered Date(s) Administered   Influenza Split 10/22/2012, 08/25/2013   Influenza Whole 08/18/2010   Influenza, High Dose Seasonal PF 12/16/2015, 12/01/2016, 10/30/2017, 10/29/2018   Influenza, Quadrivalent, Recombinant, Inj, Pf 08/23/2019   Influenza,inj,Quad PF,6+ Mos 01/05/2015   Influenza-Unspecified 10/18/2018   PFIZER(Purple Top)SARS-COV-2 Vaccination 02/13/2020, 03/09/2020   Pneumococcal Conjugate-13 10/23/2013   Pneumococcal Polysaccharide-23 08/18/2008   Td 11/10/2014   Tdap 02/23/2023   Zoster Recombinat (Shingrix) 09/08/2019, 12/18/2019    TDAP status: Up to date  Flu Vaccine status: Declined, Education has been provided regarding the importance of this vaccine but patient still declined. Advised may receive this vaccine at local pharmacy or Health Dept. Aware to provide a copy of the vaccination record if obtained from local pharmacy or Health Dept. Verbalized acceptance and  understanding.  Pneumococcal vaccine status: Up to date  Covid-19 vaccine status: Completed vaccines  Qualifies for Shingles Vaccine? Yes   Zostavax completed Yes   Shingrix Completed?: Yes  Screening Tests Health Maintenance  Topic Date Due   INFLUENZA VACCINE  07/19/2023   Medicare Annual Wellness (AWV)  04/22/2024   DTaP/Tdap/Td (3 - Td or Tdap) 02/22/2033   Pneumonia Vaccine 64+ Years old  Completed   DEXA SCAN  Completed   Zoster Vaccines- Shingrix  Completed   HPV VACCINES  Aged Out   COLONOSCOPY (Pts 45-48yrs Insurance coverage will need to be confirmed)  Discontinued   COVID-19 Vaccine  Discontinued    Health Maintenance  There are no preventive care reminders to display for this patient.   Colorectal cancer screening: No longer required.   Mammogram status: Completed 04/21/22. Repeat every year  Bone Density status: Completed 02/07/17.  Results reflect: Bone density results: OSTEOPENIA. Repeat every 2 years.   Additional Screening:   Vision Screening: Recommended annual ophthalmology exams for early detection of glaucoma and other disorders of the eye. Is the patient up to date with their annual eye exam?  Yes  Who is the provider or what is the name of the office in which the patient attends annual eye exams? [facility] If pt is not established with a provider, would they like to be referred to a provider to establish care? No .   Dental Screening: Recommended annual dental exams for proper oral hygiene  Community Resource Referral / Chronic Care Management: CRR required this visit?  No   CCM required this visit?  No      Plan:     I have personally reviewed and noted the following in the patient's chart:   Medical and social history Use of alcohol, tobacco or illicit drugs  Current medications and supplements including opioid prescriptions. Patient is not currently taking opioid prescriptions. Functional ability and status Nutritional  status Physical activity Advanced directives List of other physicians Hospitalizations, surgeries, and ER visits in previous 12 months Vitals Screenings to include cognitive, depression, and falls Referrals and appointments  In addition, I have reviewed and discussed with patient certain preventive protocols, quality metrics, and best practice recommendations. A written personalized care plan for preventive services as well as general preventive health recommendations were provided to patient.     Marzella Schlein, LPN   12/20/2438   Nurse Notes: none

## 2023-04-23 NOTE — Patient Instructions (Signed)
[name] , Thank you for taking time to come for your Medicare Wellness Visit. I appreciate your ongoing commitment to your health goals. Please review the following plan we discussed and let me know if I can assist you in the future.   These are the goals we discussed:  Goals      lose 40 pounds     Physically engage like going to the gym 3 days a week and walk 2 days a week, plan and prepare my meals     Patient Stated     None at this  time      Patient Stated     None at this time         This is a list of the screening recommended for you and due dates:  Health Maintenance  Topic Date Due   Flu Shot  07/19/2023   Medicare Annual Wellness Visit  04/22/2024   DTaP/Tdap/Td vaccine (3 - Td or Tdap) 02/22/2033   Pneumonia Vaccine  Completed   DEXA scan (bone density measurement)  Completed   Zoster (Shingles) Vaccine  Completed   HPV Vaccine  Aged Out   Colon Cancer Screening  Discontinued   COVID-19 Vaccine  Discontinued    Advanced directives: Please bring a copy of your health care power of attorney and living will to the office at your convenience.  Conditions/risks identified: none at this time   Next appointment: Follow up in one year for your annual wellness visit    Preventive Care 65 Years and Older, Female Preventive care refers to lifestyle choices and visits with your health care provider that can promote health and wellness. What does preventive care include? A yearly physical exam. This is also called an annual well check. Dental exams once or twice a year. Routine eye exams. Ask your health care provider how often you should have your eyes checked. Personal lifestyle choices, including: Daily care of your teeth and gums. Regular physical activity. Eating a healthy diet. Avoiding tobacco and drug use. Limiting alcohol use. Practicing safe sex. Taking low-dose aspirin every day. Taking vitamin and mineral supplements as recommended by your health  care provider. What happens during an annual well check? The services and screenings done by your health care provider during your annual well check will depend on your age, overall health, lifestyle risk factors, and family history of disease. Counseling  Your health care provider may ask you questions about your: Alcohol use. Tobacco use. Drug use. Emotional well-being. Home and relationship well-being. Sexual activity. Eating habits. History of falls. Memory and ability to understand (cognition). Work and work Astronomer. Reproductive health. Screening  You may have the following tests or measurements: Height, weight, and BMI. Blood pressure. Lipid and cholesterol levels. These may be checked every 5 years, or more frequently if you are over 72 years old. Skin check. Lung cancer screening. You may have this screening every year starting at age 43 if you have a 30-pack-year history of smoking and currently smoke or have quit within the past 15 years. Fecal occult blood test (FOBT) of the stool. You may have this test every year starting at age 85. Flexible sigmoidoscopy or colonoscopy. You may have a sigmoidoscopy every 5 years or a colonoscopy every 10 years starting at age 67. Hepatitis C blood test. Hepatitis B blood test. Sexually transmitted disease (STD) testing. Diabetes screening. This is done by checking your blood sugar (glucose) after you have not eaten for a while (fasting). You  may have this done every 1-3 years. Bone density scan. This is done to screen for osteoporosis. You may have this done starting at age 97. Mammogram. This may be done every 1-2 years. Talk to your health care provider about how often you should have regular mammograms. Talk with your health care provider about your test results, treatment options, and if necessary, the need for more tests. Vaccines  Your health care provider may recommend certain vaccines, such as: Influenza vaccine. This is  recommended every year. Tetanus, diphtheria, and acellular pertussis (Tdap, Td) vaccine. You may need a Td booster every 10 years. Zoster vaccine. You may need this after age 49. Pneumococcal 13-valent conjugate (PCV13) vaccine. One dose is recommended after age 20. Pneumococcal polysaccharide (PPSV23) vaccine. One dose is recommended after age 22. Talk to your health care provider about which screenings and vaccines you need and how often you need them. This information is not intended to replace advice given to you by your health care provider. Make sure you discuss any questions you have with your health care provider. Document Released: 12/31/2015 Document Revised: 08/23/2016 Document Reviewed: 10/05/2015 Elsevier Interactive Patient Education  2017 [place] Prevention in the Home Falls can cause injuries. They can happen to people of all ages. There are many things you can do to make your home safe and to help prevent falls. What can I do on the outside of my home? Regularly fix the edges of walkways and driveways and fix any cracks. Remove anything that might make you trip as you walk through a door, such as a raised step or threshold. Trim any bushes or trees on the path to your home. Use bright outdoor lighting. Clear any walking paths of anything that might make someone trip, such as rocks or tools. Regularly check to see if handrails are loose or broken. Make sure that both sides of any steps have handrails. Any raised decks and porches should have guardrails on the edges. Have any leaves, snow, or ice cleared regularly. Use sand or salt on walking paths during winter. Clean up any spills in your garage right away. This includes oil or grease spills. What can I do in the bathroom? Use night lights. Install grab bars by the toilet and in the tub and shower. Do not use towel bars as grab bars. Use non-skid mats or decals in the tub or shower. If you need to sit down in  the shower, use a plastic, non-slip stool. Keep the floor dry. Clean up any water that spills on the floor as soon as it happens. Remove soap buildup in the tub or shower regularly. Attach bath mats securely with double-sided non-slip rug tape. Do not have throw rugs and other things on the floor that can make you trip. What can I do in the bedroom? Use night lights. Make sure that you have a light by your bed that is easy to reach. Do not use any sheets or blankets that are too big for your bed. They should not hang down onto the floor. Have a firm chair that has side arms. You can use this for support while you get dressed. Do not have throw rugs and other things on the floor that can make you trip. What can I do in the kitchen? Clean up any spills right away. Avoid walking on wet floors. Keep items that you use a lot in easy-to-reach places. If you need to reach something above you, use a strong  step stool that has a grab bar. Keep electrical cords out of the way. Do not use floor polish or wax that makes floors slippery. If you must use wax, use non-skid floor wax. Do not have throw rugs and other things on the floor that can make you trip. What can I do with my stairs? Do not leave any items on the stairs. Make sure that there are handrails on both sides of the stairs and use them. Fix handrails that are broken or loose. Make sure that handrails are as long as the stairways. Check any carpeting to make sure that it is firmly attached to the stairs. Fix any carpet that is loose or worn. Avoid having throw rugs at the top or bottom of the stairs. If you do have throw rugs, attach them to the floor with carpet tape. Make sure that you have a light switch at the top of the stairs and the bottom of the stairs. If you do not have them, ask someone to add them for you. What else can I do to help prevent falls? Wear shoes that: Do not have high heels. Have rubber bottoms. Are comfortable  and fit you well. Are closed at the toe. Do not wear sandals. If you use a stepladder: Make sure that it is fully opened. Do not climb a closed stepladder. Make sure that both sides of the stepladder are locked into place. Ask someone to hold it for you, if possible. Clearly mark and make sure that you can see: Any grab bars or handrails. First and last steps. Where the edge of each step is. Use tools that help you move around (mobility aids) if they are needed. These include: Canes. Walkers. Scooters. Crutches. Turn on the lights when you go into a dark area. Replace any light bulbs as soon as they burn out. Set up your furniture so you have a clear path. Avoid moving your furniture around. If any of your floors are uneven, fix them. If there are any pets around you, be aware of where they are. Review your medicines with your doctor. Some medicines can make you feel dizzy. This can increase your chance of falling. Ask your doctor what other things that you can do to help prevent falls. This information is not intended to replace advice given to you by your health care provider. Make sure you discuss any questions you have with your health care provider. Document Released: 09/30/2009 Document Revised: 05/11/2016 Document Reviewed: 01/08/2015 Elsevier Interactive Patient Education  2017 [employer].

## 2023-04-25 NOTE — Assessment & Plan Note (Signed)
Stable, normotensive; she has a log from home readings and these are trending normal as well. She will continue on norvasc10 mg, HCTZ 12.5 mg daily

## 2023-04-25 NOTE — Assessment & Plan Note (Signed)
Lab Results  Component Value Date   HGBA1C 5.6 04/23/2023   HGBA1C 5.4 07/05/2022   HGBA1C 5.7 04/16/2020   Reassured normal, doing great, continue good lifestyle control.

## 2023-05-16 ENCOUNTER — Ambulatory Visit
Admission: RE | Admit: 2023-05-16 | Discharge: 2023-05-16 | Disposition: A | Discharge status: Home / Self Care | Payer: Medicare HMO | Source: Ambulatory Visit | Attending: Pulmonary Disease | Admitting: Pulmonary Disease

## 2023-05-16 DIAGNOSIS — J84112 Idiopathic pulmonary fibrosis: Secondary | ICD-10-CM | POA: Diagnosis not present

## 2023-05-21 ENCOUNTER — Telehealth: Payer: Self-pay | Admitting: Pulmonary Disease

## 2023-05-21 NOTE — Telephone Encounter (Signed)
Spoke with patient today about recent results. Patient stated she was informed that her Esbriet would be discontinued and her provider needed to be contacted.  Patient stated she is concerned about not receiving Esbriet anymore.  Advised I would send a message to our pharmacy team and someone will contact her shortly about her Esbriet.    Message routed to Pharmacy Team

## 2023-05-23 NOTE — Telephone Encounter (Signed)
Enrolled patient into pulmonary fibrosis grant through Patient [employer]. This has $5500. Award Period: 11/24/2022 through 05/22/2024 ID: [id number] BIN: [id number] PCN: [id number] Group: [id number]  For pharmacy inquiries, contact PDMI at [phone] For patient inquiries, contact PAF at [phone]  Will await letter from patient next week. If she is being un-enrolled from [employer], we will have this grant as an option. Unfortunately patient will likely have to fill generic even if she prefers brand (per insurance requirement).  Chesley Mires, PharmD, MPH, BCPS, CPP Clinical Pharmacist (Rheumatology and Pulmonology)

## 2023-05-23 NOTE — Telephone Encounter (Signed)
Spoke to patient via phone about the letter she received from [place] about Esbriet program discontinuation. Pt says the letter stated she would be discontinued from the program sometime this Fall but unable to provide specific details when asked.   Asked pt to drop off letter to the clinic so that we may better understand the situation. Pt is currently leaving town and will be back next week which is when she will drop it off.

## 2023-06-05 NOTE — Telephone Encounter (Signed)
Called pt on 06/04/23 as reminder for her to drop off letter that she received form [employer]. She states she will do this. Will await  Chesley Mires, PharmD, MPH, BCPS, CPP Clinical Pharmacist (Rheumatology and Pulmonology)

## 2023-06-12 ENCOUNTER — Other Ambulatory Visit: Payer: Self-pay | Admitting: Physician Assistant

## 2023-06-12 DIAGNOSIS — R1319 Other dysphagia: Secondary | ICD-10-CM

## 2023-06-12 NOTE — Telephone Encounter (Signed)
Received a fax from  [place] regarding an approval for ESBRIET patient assistance through 12/17/2024. Approval letter sent to scan center.  Phone #: [phone] Fax #: [phone]  Called patient to notify of this. Advised her to call [facility] for refills when needed  Chesley Mires, PharmD, MPH, BCPS, CPP Clinical Pharmacist (Rheumatology and Pulmonology)

## 2023-06-13 NOTE — Telephone Encounter (Signed)
Pt requesting refill for Trimethoprim. I do not see it on her med list. Please advise.

## 2023-06-15 NOTE — Telephone Encounter (Signed)
Spoke to pt asked her if she is taking Trimethoprim 100 mg daily? Pt said yes. Told her okay will send refill to the pharmacy. Pt verbalized understanding. Rx sent.

## 2023-07-04 ENCOUNTER — Ambulatory Visit: Payer: Medicare HMO | Admitting: Pulmonary Disease

## 2023-08-22 ENCOUNTER — Encounter: Payer: Self-pay | Admitting: Pulmonary Disease

## 2023-08-22 ENCOUNTER — Ambulatory Visit: Payer: Medicare HMO | Admitting: Pulmonary Disease

## 2023-08-22 VITALS — BP 122/60 | HR 71 | Ht 62.0 in | Wt 150.0 lb

## 2023-08-22 DIAGNOSIS — J84112 Idiopathic pulmonary fibrosis: Secondary | ICD-10-CM

## 2023-08-22 DIAGNOSIS — Z5181 Encounter for therapeutic drug level monitoring: Secondary | ICD-10-CM | POA: Diagnosis not present

## 2023-08-22 LAB — BASIC METABOLIC PANEL
BUN: 8 mg/dL (ref 6–23)
CO2: 30 meq/L (ref 19–32)
Calcium: 9.7 mg/dL (ref 8.4–10.5)
Chloride: 99 meq/L (ref 96–112)
Creatinine, Ser: 0.7 mg/dL (ref 0.40–1.20)
GFR: 80.33 mL/min (ref >60.00)
Glucose, Bld: 94 mg/dL (ref 70–99)
Potassium: 3.6 meq/L (ref 3.5–5.1)
Sodium: 136 meq/L (ref 135–145)

## 2023-08-22 NOTE — Patient Instructions (Signed)
I am glad you are stable with your breathing Will check complaints metabolic panel for monitoring Schedule PFTs in 6 months Return to clinic in 6 months

## 2023-08-22 NOTE — Progress Notes (Signed)
Maria [name]    [id number]    Nov 19, 1941  Primary Care Physician:Allwardt, Crist Infante, PA-C  Referring Physician: Allwardt, Crist Infante, PA-C [address] [place],  [place] [address]  Chief complaint:  Follow-up for IPF Started Esbriet September 2021  HPI: 82 y.o. with history of hypertension, stroke, sleep apnea. Referred for evaluation of interstitial lung disease This was noted on CT scan done in 2018.  Notes increasing cough, dyspnea over the past few months.  Symptoms worsen after COVID-19 vaccination.  No mucus production, wheezing or chills.  She had an exposure to DDT as a child ( middle School) and had severe respiratory problems ever since, including multiple flu illnesses that would take her 6-8 weeks to recover from.  Finished pulmonary rehab in 2022 [place] at [facility] for second opinion in 2022 She developed COVID-19 in January 2023 and was treated with molnupiravir.  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  No mold, hot tub, Jacuzzi.  No asbestos, down pillows or comforters Smoking history: 5-pack-year smoker.  Quit in 1971 Travel history: No significant travel history Relevant family history: Grandfather had emphysema.  He was a pipe smoker  Interim history: Doing well with breathing.  Was on a trip to [place] earlier this year  Outpatient Encounter Medications as of 08/22/2023  Medication Sig   amLODipine (NORVASC) 10 MG tablet Take 1 tablet (10 mg total) by mouth daily.   CALCIUM PO Take by mouth daily.   Cholecalciferol (VITAMIN D-3 PO) Take by mouth daily.   clopidogrel (PLAVIX) 75 MG tablet TAKE 1 TABLET EVERY DAY   co-enzyme Q-10 30 MG capsule Take 30 mg by mouth 3 (three) times daily.   Collagen Hydrolysate POWD 1 Scoop by Does not apply route daily.   Cyanocobalamin (VITAMIN B 12 PO) Take by  mouth.   ferrous sulfate 324 MG TBEC Take 324 mg by mouth every other day.   hydrochlorothiazide (MICROZIDE) 12.5 MG capsule TAKE 1 CAPSULE EVERY DAY   levocetirizine (XYZAL) 5 MG tablet TAKE 1 TABLET EVERY EVENING   LUTEIN PO Take by mouth daily.   montelukast (SINGULAIR) 10 MG tablet TAKE 1 TABLET AT BEDTIME   Multiple Vitamin (VITAMIN E/FOLIC ACID/B-6/B-12 PO) Take by mouth. Vit  b 6   Multiple Vitamins-Minerals (PRESERVISION AREDS 2 PO) Take by mouth.   oxybutynin (DITROPAN-XL) 10 MG 24 hr tablet TAKE 1 TABLET EVERY DAY   pantoprazole (PROTONIX) 40 MG tablet TAKE 1 TABLET TWICE DAILY   Pirfenidone (ESBRIET) 267 MG CAPS TAKE 3 CAPSULES BY MOUTH WITH BREAKFAST, WITH LUNCH, AND WITH EVENING MEAL.   potassium chloride (KLOR-CON M) 10 MEQ tablet TAKE 1 TABLET EVERY DAY   simvastatin (ZOCOR) 10 MG tablet TAKE 1 TABLET EVERY DAY AT 6 PM   trimethoprim (TRIMPEX) 100 MG tablet TAKE 1 TABLET EVERY DAY   No facility-administered encounter medications on file as of 08/22/2023.   Physical Exam: Blood pressure 122/60, pulse 71, height 5\' 2"  (1.575 m), weight 150 lb (68 kg), SpO2 94%. Gen:      No acute distress HEENT:  EOMI, sclera anicteric Neck:     No masses; no thyromegaly Lungs:    Clear to auscultation bilaterally; normal respiratory effort CV:         Regular rate and rhythm; no murmurs Abd:      +  bowel sounds; soft, non-tender; no palpable masses, no distension Ext:    No edema; adequate peripheral perfusion Skin:      Warm and dry; no rash Neuro: alert and oriented x 3 Psych: normal mood and affect   Data Reviewed: Imaging: High-resolution CT 03/29/2017-basal gradient fibrotic changes with reticulation, traction bronchiectasis.  Moderate air trapping.  Probable UIP pattern.  High-res CT 04/15/2020-mildly progressive pulmonary fibrosis probable UIP pattern.  High-res CT 04/20/2022-stable pattern of fibrosis in probable UIP pattern.  High-res CT 04/20/2022-stable pattern of fibrosis in  probable UIP pattern.  High resolution CT 05/16/2023-stable pattern of pulmonary fibrosis in probable UIP pattern I have reviewed the images personally.  PFTs: 04/17/2017 FVC 1.69 [64%], FEV1 1.45 [73%], F/F 86, TLC 3.72 [76%], DLCO 14.90 [65%]  06/17/2020 FVC 1.72 [68%], FEV1 1.44 [76%], F/F 84, TLC 3.26 [66%], DLCO 14.53 [79%]   07/05/2022 FVC 1.69 [72%], FEV1 1.45 [84%], F/F86, TLC 3.31 [69%], DLCO 13.38 [76%] Mild restriction, minimal diffusion defect  6-minute walk test 06/30/2020- 443 m, nadir O2 sat of 90%  Labs: RAST panel 04/27/2015-IgE 40, sensitive to grass, tree pollen CBC 02/18/2018-WBC 6.2, eos 3.9%, absolute eosinophil count 242  N-terminal proBNP 09/10/2019 1-24  ILD serologies 04/09/2020-atypical p-ANCA 1:80  Sleep: PSG 07/31/17, AHI 6.4, SpO2 low 85%. CPAP titration 11/15/2017 CPAP 7  Cardiac: Echocardiogram 09/02/2021-LVEF 60 to 65%, RV systolic function is normal.  Normal PA systolic pressure.  No evidence of shunt by bubble study  Echocardiogram 03/02/2023-LVEF 60 to 65%, RV systolic function is normal, no pulmonary hypertension.  Assessment:  Pulmonary fibrosis Probable UIP pattern.  This is likely IPF with minimal exposures.  Serologies show mild elevation in ANCA which is likely nonspecific with no other signs and symptoms of autoimmune process.  Discussed findings in detail.  To make a diagnosis with certainty we would need a lung biopsy but not recommended given her age. Given mild progression on CT scan and PFTs with worsening symptoms we have decided to treat with antifibrotic Continue Esbriet Check LFTs  Mild pulmonary hypertension Noted on echocardiogram in 2021 however repeat study did not show any evidence of pulmonary hypertension.  Continue monitoring.  Chronic cough Secondary to pulmonary fibrosis, GERD Continue PPI, Tessalon Advised to raise head of the bed and not lay down at least 2 hours after eating Start flutter valve and Mucinex for chest  congestion  OSA Stable Download reviewed with good compliance  Plan/Recommendations: - Continue Esbriet, check LFTs, proBNP - Follow-up PFTs - Mucinex, flutter valve for chest congestion, cough  Chilton Greathouse MD [facility] 08/22/2023, 9:05 AM  CC: Allwardt, Crist Infante, PA-C

## 2023-09-13 DIAGNOSIS — H5203 Hypermetropia, bilateral: Secondary | ICD-10-CM | POA: Diagnosis not present

## 2023-09-13 DIAGNOSIS — H26491 Other secondary cataract, right eye: Secondary | ICD-10-CM | POA: Diagnosis not present

## 2023-09-13 DIAGNOSIS — H40013 Open angle with borderline findings, low risk, bilateral: Secondary | ICD-10-CM | POA: Diagnosis not present

## 2023-09-13 DIAGNOSIS — Z961 Presence of intraocular lens: Secondary | ICD-10-CM | POA: Diagnosis not present

## 2023-09-13 DIAGNOSIS — H02839 Dermatochalasis of unspecified eye, unspecified eyelid: Secondary | ICD-10-CM | POA: Diagnosis not present

## 2023-09-13 DIAGNOSIS — H16121 Filamentary keratitis, right eye: Secondary | ICD-10-CM | POA: Diagnosis not present

## 2023-09-13 DIAGNOSIS — H43812 Vitreous degeneration, left eye: Secondary | ICD-10-CM | POA: Diagnosis not present

## 2023-09-13 DIAGNOSIS — H35373 Puckering of macula, bilateral: Secondary | ICD-10-CM | POA: Diagnosis not present

## 2023-09-13 DIAGNOSIS — H02831 Dermatochalasis of right upper eyelid: Secondary | ICD-10-CM | POA: Diagnosis not present

## 2023-10-24 ENCOUNTER — Ambulatory Visit: Payer: Medicare HMO | Admitting: Physician Assistant

## 2023-10-24 ENCOUNTER — Telehealth: Payer: Self-pay | Admitting: Pulmonary Disease

## 2023-10-24 ENCOUNTER — Other Ambulatory Visit: Payer: Self-pay | Admitting: Pharmacist

## 2023-10-24 DIAGNOSIS — J8410 Pulmonary fibrosis, unspecified: Secondary | ICD-10-CM

## 2023-10-24 MED ORDER — PIRFENIDONE 267 MG PO CAPS
ORAL_CAPSULE | ORAL | 1 refills | Status: DC
Start: 2023-10-24 — End: 2024-01-10

## 2023-10-24 MED ORDER — PIRFENIDONE 267 MG PO CAPS: ORAL_CAPSULE | ORAL | 1 refills | Status: DC

## 2023-10-24 NOTE — Telephone Encounter (Signed)
PT calling stating that she wants to speak to a Pharm rep. States that her Maria [name] application process has all gone thru beautifully  but it is lacking a Prescription from the Dr. Please call to advise @ [phone]  [facility] is fax'ing [place] now and they say a verbal from the Dr. will speed up the process and she'd like this done  ASAP because she is out.

## 2023-10-24 NOTE — Telephone Encounter (Signed)
Rx for Esbriet sent to [facility] today. Left VM with patient regarding this  Dose: 801 mg three times daily  Chesley Mires, PharmD, MPH, BCPS, CPP Clinical Pharmacist (Rheumatology and Pulmonology)

## 2023-10-24 NOTE — Telephone Encounter (Signed)
See last signed encounter. PT calling back upset we did not call in with a verbal OK but sent in a refill auth. Please call to advise. Thanks. (My apologies for the incorrect drug type in my first tel encounte

## 2023-10-24 NOTE — Telephone Encounter (Signed)
Patient is not on Breztri?  Refil fo Esbriet sent to [facility] today. VM left with patient. Did not receive any refill request from pharmacy

## 2023-10-25 NOTE — Telephone Encounter (Signed)
Patient would like to say thank you for helping her fill her prescription and explaining the process.

## 2023-10-31 ENCOUNTER — Telehealth: Payer: Self-pay

## 2023-10-31 ENCOUNTER — Encounter: Payer: Self-pay | Admitting: Physician Assistant

## 2023-10-31 ENCOUNTER — Ambulatory Visit: Payer: Medicare HMO | Admitting: Physician Assistant

## 2023-10-31 VITALS — BP 119/73 | HR 83 | Temp 97.3°F | Ht 62.0 in | Wt 148.2 lb

## 2023-10-31 DIAGNOSIS — I10 Essential (primary) hypertension: Secondary | ICD-10-CM | POA: Diagnosis not present

## 2023-10-31 DIAGNOSIS — N3281 Overactive bladder: Secondary | ICD-10-CM | POA: Diagnosis not present

## 2023-10-31 DIAGNOSIS — J069 Acute upper respiratory infection, unspecified: Secondary | ICD-10-CM | POA: Diagnosis not present

## 2023-10-31 DIAGNOSIS — E538 Deficiency of other specified B group vitamins: Secondary | ICD-10-CM | POA: Diagnosis not present

## 2023-10-31 DIAGNOSIS — Z8639 Personal history of other endocrine, nutritional and metabolic disease: Secondary | ICD-10-CM | POA: Diagnosis not present

## 2023-10-31 DIAGNOSIS — Z87898 Personal history of other specified conditions: Secondary | ICD-10-CM | POA: Diagnosis not present

## 2023-10-31 DIAGNOSIS — M549 Dorsalgia, unspecified: Secondary | ICD-10-CM | POA: Diagnosis not present

## 2023-10-31 DIAGNOSIS — E782 Mixed hyperlipidemia: Secondary | ICD-10-CM

## 2023-10-31 DIAGNOSIS — J84112 Idiopathic pulmonary fibrosis: Secondary | ICD-10-CM

## 2023-10-31 LAB — VITAMIN B12: Vitamin B-12: 918 pg/mL — ABNORMAL HIGH (ref 211–911)

## 2023-10-31 LAB — COMPREHENSIVE METABOLIC PANEL
ALT: 12 U/L (ref 0–35)
AST: 20 U/L (ref 0–37)
Albumin: 4.3 g/dL (ref 3.5–5.2)
Alkaline Phosphatase: 90 U/L (ref 39–117)
BUN: 9 mg/dL (ref 6–23)
CO2: 31 meq/L (ref 19–32)
Calcium: 9.8 mg/dL (ref 8.4–10.5)
Chloride: 98 meq/L (ref 96–112)
Creatinine, Ser: 0.67 mg/dL (ref 0.40–1.20)
GFR: 81.08 mL/min (ref >60.00)
Glucose, Bld: 93 mg/dL (ref 70–99)
Potassium: 4.3 meq/L (ref 3.5–5.1)
Sodium: 135 meq/L (ref 135–145)
Total Bilirubin: 0.6 mg/dL (ref 0.2–1.2)
Total Protein: 7.5 g/dL (ref 6.0–8.3)

## 2023-10-31 LAB — CBC WITH DIFFERENTIAL/PLATELET
Basophils Absolute: 0.1 10*3/uL (ref 0.0–0.1)
Basophils Relative: 1.1 % (ref 0.0–3.0)
Eosinophils Absolute: 0.2 10*3/uL (ref 0.0–0.7)
Eosinophils Relative: 3.1 % (ref 0.0–5.0)
HCT: 44 % (ref 36.0–46.0)
Hemoglobin: 14.5 g/dL (ref 12.0–15.0)
Lymphocytes Relative: 23.7 % (ref 12.0–46.0)
Lymphs Abs: 1.5 10*3/uL (ref 0.7–4.0)
MCHC: 32.9 g/dL (ref 30.0–36.0)
MCV: 90.9 fL (ref 78.0–100.0)
Monocytes Absolute: 0.9 10*3/uL (ref 0.1–1.0)
Monocytes Relative: 13.6 % — ABNORMAL HIGH (ref 3.0–12.0)
Neutro Abs: 3.7 10*3/uL (ref 1.4–7.7)
Neutrophils Relative %: 58.5 % (ref 43.0–77.0)
Platelets: 280 10*3/uL (ref 150.0–400.0)
RBC: 4.84 Mil/uL (ref 3.87–5.11)
RDW: 14.2 % (ref 11.5–15.5)
WBC: 6.4 10*3/uL (ref 4.0–10.5)

## 2023-10-31 LAB — LIPID PANEL
Cholesterol: 172 mg/dL (ref 0–200)
HDL: 63.2 mg/dL (ref >39.00)
LDL Cholesterol: 92 mg/dL (ref 0–99)
NonHDL: 109.18
Total CHOL/HDL Ratio: 3
Triglycerides: 88 mg/dL (ref 0.0–149.0)
VLDL: 17.6 mg/dL (ref 0.0–40.0)

## 2023-10-31 LAB — HEMOGLOBIN A1C: Hgb A1c MFr Bld: 5.7 % (ref 4.6–6.5)

## 2023-10-31 MED ORDER — AMLODIPINE BESYLATE 10 MG PO TABS: 10.0000 mg | ORAL_TABLET | Freq: Every day | ORAL | 3 refills | Status: DC

## 2023-10-31 NOTE — Patient Instructions (Signed)
VISIT SUMMARY:  During today's visit, we discussed several health concerns including your recent respiratory symptoms, blood pressure management, dental issues, urinary incontinence, and back pain. We also reviewed your general health maintenance and made plans for necessary follow-ups.  YOUR PLAN:  -UPPER RESPIRATORY INFECTION: You have developed symptoms of a cold, including a cough, within the last 24 hours. We will contact your pulmonologist for further recommendations due to your history of pulmonary fibrosis.  -HYPERTENSION: Your blood pressure has been fluctuating, partly due to irregular medication intake caused by difficulty eating with your dentures. Hypertension means high blood pressure, which can lead to serious health issues if not managed properly. Please try to take your medication consistently, and we will check your blood pressure again today.  -DENTAL ISSUES: Your lower dentures are not fitting well, making it hard for you to eat and take your medications. We recommend seeing a dentist to adjust or replace your dentures.  -URINARY INCONTINENCE: You are managing your urinary incontinence with oxybutynin, which has been effective in reducing nighttime urination. Urinary incontinence is the loss of bladder control, leading to accidental urine leakage. Continue taking oxybutynin as prescribed.  -BACK PAIN: You have chronic back pain, especially in the lower back and at the bra line, which worsens when sitting. Back pain can be due to various reasons, including muscle strain or spinal issues. We will refer you to physical therapy for evaluation and exercises to help manage the pain.  -GENERAL HEALTH MAINTENANCE: We will order labs to monitor your cholesterol and blood glucose levels, and check your B12 level due to your history of deficiency. Your amlodipine prescription has been refilled, and we will ensure all your other medications are up to date.  INSTRUCTIONS:  Please follow up  with your pulmonologist regarding your respiratory symptoms. Schedule a dental appointment for your dentures. Continue taking your medications as prescribed and attend physical therapy for your back pain. We will contact you with the results of your lab tests.

## 2023-10-31 NOTE — Progress Notes (Unsigned)
Patient ID: Maria [name], female    DOB: Apr 04, 1941, 82 y.o.   MRN: [id number]   Assessment & Plan:  There are no diagnoses linked to this encounter.      No follow-ups on file.    Subjective:    Chief Complaint  Patient presents with   Annual Exam    Patient says she is here for a physical but has a cold that is going to her chest Patient is fasting    HPI Patient is in today for ***  Past Medical History:  Diagnosis Date   Anemia    Arthritis    Bronchitis    Carpal tunnel syndrome    in calves neuropathy   Diverticulosis    GERD (gastroesophageal reflux disease)    Headache(784.0)    chronic   High cholesterol    Hyperlipidemia    Hypertension    Migraine    Nephritis    OSA (obstructive sleep apnea)    Palpitations    Personal history of colonic adenomas 09/15/2010   Pyelonephritis, acute 09/23/2013   A. 10/14: Hospitalized [facility] for treatment     Stroke [facility])    TIA (transient ischemic attack)    Vertigo, benign paroxysmal     Past Surgical History:  Procedure Laterality Date   BLADDER REPAIR     BREAST BIOPSY     rt benign cyst   BREAST EXCISIONAL BIOPSY Right    CARPAL TUNNEL RELEASE     x 2   COLONOSCOPY     REPLACEMENT TOTAL KNEE BILATERAL     TONSILLECTOMY     UPPER GASTROINTESTINAL ENDOSCOPY      Family History  Problem Relation Age of Onset   Diabetes Father    Aneurysm Father    Kidney disease Father    Hypertension Father    Heart disease Mother    Colon polyps Mother    Chronic bronchitis Mother    Diabetes Brother    Colon cancer Maternal Grandmother    Irritable bowel syndrome Daughter    Emphysema Maternal Grandfather        smoked a pipe    Social History   Tobacco Use   Smoking status: Former    Current packs/day: 0.00    Average packs/day: 1 pack/day for 10.0 years (10.0 ttl pk-yrs)    Types: Cigarettes    Start date: 12/19/1959    Quit date: 12/18/1969    Years since quitting: 53.9     Passive exposure: Past   Smokeless tobacco: Never  Vaping Use   Vaping status: Never Used  Substance Use Topics   Alcohol use: Yes    Comment: ocassional use   Drug use: No     Allergies  Allergen Reactions   Amoxicillin Hives   Olmesartan Hives   Ace Inhibitors Other (See Comments)    unknown   Bee Venom    Benicar [Olmesartan Medoxomil] Other (See Comments)    Tongue swelling   Codeine     REACTION: nausea   Covid-19 (Mrna) Vaccine     Increased shortness of Breath in Fibrosis   Lisinopril Other (See Comments)    unknown   Macrobid [Nitrofurantoin Monohyd Macro]     Pulmonary Fibrosis   Nutritional Supplements     Bee stings cause angioedema-carry an EPIPEN   Penicillins    Shingrix [Zoster Vac Recomb Adjuvanted] Other (See Comments)    Chills, feverish, nausea x 4 days    Review  of Systems NEGATIVE UNLESS OTHERWISE INDICATED IN HPI      Objective:     BP 119/73   Pulse 83   Temp (!) 97.3 F (36.3 C) (Temporal)   Ht 5\' 2"  (1.575 m)   Wt 148 lb 3.2 oz (67.2 kg)   SpO2 96%   BMI 27.11 kg/m   Wt Readings from Last 3 Encounters:  10/31/23 148 lb 3.2 oz (67.2 kg)  08/22/23 150 lb (68 kg)  04/23/23 155 lb (70.3 kg)    BP Readings from Last 3 Encounters:  10/31/23 119/73  08/22/23 122/60  04/23/23 130/82     Physical Exam        Time Spent: *** minutes of total time was spent on the date of the encounter performing the following actions: chart review prior to seeing the patient, obtaining history, performing a medically necessary exam, counseling on the treatment plan, placing orders, and documenting in our EHR.       Macari Zalesky M Rinda Rollyson, PA-C

## 2023-10-31 NOTE — Telephone Encounter (Signed)
Pt called in reference to today's visit and seeing if her Pulmonology provider reached back to PCP on Rx for chest cold. Please advise

## 2023-11-01 ENCOUNTER — Other Ambulatory Visit: Payer: Self-pay

## 2023-11-01 MED ORDER — AZITHROMYCIN 250 MG PO TABS: ORAL_TABLET | ORAL | 0 refills | Status: AC

## 2023-11-01 MED ORDER — PREDNISONE 20 MG PO TABS
20.0000 mg | ORAL_TABLET | Freq: Two times a day (BID) | ORAL | 0 refills | Status: AC
Start: 2023-11-01 — End: 2023-11-06

## 2023-11-01 NOTE — Telephone Encounter (Signed)
Called patient and was advised she has thick white sputum today but no fever yet. Per PCP Allwardt send in prescriptions to her pharmacy. Pt advised to keep [place] posted on how she is feeling. Pt verbalized understanding

## 2023-11-21 ENCOUNTER — Other Ambulatory Visit: Payer: Self-pay

## 2023-11-21 ENCOUNTER — Encounter: Payer: Self-pay | Admitting: Physical Therapy

## 2023-11-21 ENCOUNTER — Ambulatory Visit: Payer: Medicare HMO | Admitting: Physical Therapy

## 2023-11-21 DIAGNOSIS — M546 Pain in thoracic spine: Secondary | ICD-10-CM | POA: Diagnosis not present

## 2023-11-21 DIAGNOSIS — M5459 Other low back pain: Secondary | ICD-10-CM | POA: Diagnosis not present

## 2023-11-21 DIAGNOSIS — M2569 Stiffness of other specified joint, not elsewhere classified: Secondary | ICD-10-CM | POA: Diagnosis not present

## 2023-11-21 NOTE — Therapy (Incomplete)
OUTPATIENT PHYSICAL THERAPY THORACOLUMBAR EVALUATION   Patient Name: Maria [name] MRN: [id number] DOB:1941-07-25, 82 y.o., female Today's Date: 11/21/2023  END OF SESSION:  PT End of Session - 11/21/23 1047     Visit Number 1    Number of Visits 16    Date for PT Re-Evaluation 01/16/24    Authorization Type [employer]    Progress Note Due on Visit 10    PT Start Time [phone]    PT Stop Time 1025    PT Time Calculation (min) 43 min    Activity Tolerance Patient tolerated treatment well;No increased pain    Behavior During Therapy WFL for tasks assessed/performed             Past Medical History:  Diagnosis Date   Anemia    Arthritis    Bronchitis    Carpal tunnel syndrome    in calves neuropathy   Diverticulosis    GERD (gastroesophageal reflux disease)    Headache(784.0)    chronic   High cholesterol    Hyperlipidemia    Hypertension    Migraine    Nephritis    OSA (obstructive sleep apnea)    Palpitations    Personal history of colonic adenomas 09/15/2010   Pyelonephritis, acute 09/23/2013   A. 10/14: Hospitalized [facility] for treatment     Stroke [facility])    TIA (transient ischemic attack)    Vertigo, benign paroxysmal    Past Surgical History:  Procedure Laterality Date   BLADDER REPAIR     BREAST BIOPSY     rt benign cyst   BREAST EXCISIONAL BIOPSY Right    CARPAL TUNNEL RELEASE     x 2   COLONOSCOPY     REPLACEMENT TOTAL KNEE BILATERAL     TONSILLECTOMY     UPPER GASTROINTESTINAL ENDOSCOPY     Patient Active Problem List   Diagnosis Date Noted   Overactive bladder 10/31/2023   Musculoskeletal back pain 10/31/2023   B12 deficiency 10/31/2023   History of prediabetes 04/23/2023   IPF (idiopathic pulmonary fibrosis) (HCC) 10/18/2021   Chronic respiratory failure with hypoxia (HCC) 10/18/2021   Iron deficiency 08/05/2020   Recurrent UTI 01/23/2020   Posttraumatic headache 03/03/2019   Dysuria 08/09/2018   Rash 08/09/2018    Vaginitis 08/09/2018   Periodic limb movements of sleep 08/15/2017   Exertional dyspnea 02/27/2017   GERD (gastroesophageal reflux disease) 02/27/2017   Edema 12/16/2015   Obesity 06/09/2015   Pulmonary fibrosis (HCC) 04/27/2015   Allergic rhinitis 03/19/2015   Wheezing 01/20/2015   History of esophageal stricture 09/29/2013   HTN (hypertension) 09/23/2013   Asthma 09/23/2013   chronic cough ? etiology  09/02/2013   Sinus bradycardia 04/09/2013   Impaired glucose tolerance 05/24/2011   Migraine    Preventative health care 05/21/2011   Hyperlipidemia    TIA (transient ischemic attack)    Vertigo, benign paroxysmal    Obstructive sleep apnea 03/22/2011   Paresthesias 03/22/2011   Insomnia 03/22/2011   History of colonic polyps 09/15/2010   ESOPHAGEAL STRICTURE 08/11/2010   GERD 08/11/2010    PCP: Allwardt, Crist Infante, PA-C  REFERRING PROVIDER: Allwardt, Crist Infante, PA-C  REFERRING DIAG:   Rationale for Evaluation and Treatment: Rehabilitation  THERAPY DIAG:  Other low back pain  Pain in thoracic spine  Back stiffness  ONSET DATE: Lumbar 10/2023, Thoracic unknown  SUBJECTIVE:  SUBJECTIVE STATEMENT: Pt states several years ago she began having insidious mid thoracic pain and it has not improved. In 10/2023, she began having lower back pain after raking the leaves in her yard that has continued to get worse. Her LBP occurs around the medial and the left side of her spine. Pt has difficulty with activities such as hiking, sleeping, water aerobics, sitting for prolonged periods of time, stairs, and line dancing. Pt uses supplemental oxygen to sleep and during activities outside the home- although she able to complete activities for around an hour without using her supplemental oxygen.     PERTINENT HISTORY:  Arthritis, High cholesterol, HTN, Chronic respiratory failure with hypoxia  PAIN:  Are you having pain? Yes: NPRS scale: 8 /10 (with prolonged activity) Pain location: Midline and Left sided Low back Pain description: Sharp, tight,  Aggravating factors: Sitting, standing, sleeping Relieving factors: N/a   Are you having pain? Yes: NPRS scale: rest: 0/10 Pain location: Thoracic Pain description: Dull pain  Aggravating factors: Sitting, standing, sleeping Relieving factors: Massage on 10/19/23  PRECAUTIONS: None  RED FLAGS: None   WEIGHT BEARING RESTRICTIONS: No  FALLS:  Has patient fallen in last 6 months? No  LIVING ENVIRONMENT:  OCCUPATION: Retired  PLOF: Independent  PATIENT GOALS: Improve her ability with gardening, line dancing, hiking,   NEXT MD VISIT: n/a  OBJECTIVE:  Note: Objective measures were completed at Evaluation unless otherwise noted.  DIAGNOSTIC FINDINGS:  Scoliosis of spine   PATIENT SURVEYS:  FOTO To be administered at future date  SCREENING FOR RED FLAGS: Bowel or bladder incontinence: No Spinal tumors: No Cauda equina syndrome: No Compression fracture: No Abdominal aneurysm: No  COGNITION: Overall cognitive status: Within functional limits for tasks assessed     SENSATION: Not tested  MUSCLE LENGTH: Hamstrings: Right WFL deg; Left WFL deg Piriformis: Right WFL deg; Left WFL deg  POSTURE: rounded shoulders and decreased lumbar lordosis  PALPATION: TTP midline thoracic PS (around T7-T10), midline lumbar spine and L glutes   LOWER EXTREMITY ROM:     Hip ROM: WFL,  Knee: WFL  LOWER EXTREMITY MMT:    MMT Right eval Left eval  Hip flexion 4+ 4+  Hip extension    Hip abduction    Hip adduction    Hip internal rotation    Hip external rotation    Knee flexion 4+ 4+  Knee extension    Ankle dorsiflexion    Ankle plantarflexion    Ankle inversion    Ankle eversion     (Blank rows = not  tested) LUMBAR ROM:   Active  A/PROM  eval  Flexion WFL  Extension WFL  Right lateral flexion Min   Left lateral flexion min  Right rotation min  Left rotation mod   (Blank rows = not tested)     LUMBAR SPECIAL TESTS:    FUNCTIONAL TESTS:    GAIT: Distance walked: 25 ft Assistive device utilized: None Level of assistance: Complete Independence Comments: Min decreased arm swing, slight L hip hike  TODAY'S TREATMENT:  DATE: 11/21/2023  Therapeutic Exercise: Aerobic: Supine:  Seated: 3 way medicine ball rollouts x10 Standing: Stretches: LTR 20x5"; Piriformis stretch bil 2x30" Neuromuscular Re-education: Manual Therapy: Therapeutic Activity: Self Care:    PATIENT EDUCATION:  Education details: PT POC, Exam findings, HEP Person educated: Patient Education method: Explanation, Demonstration, Tactile cues, Verbal cues, and Handouts Education comprehension: verbalized understanding, returned demonstration, verbal cues required, tactile cues required, and needs further education    HOME EXERCISE PROGRAM: Access Code: [email or web address] URL: [email or web address] Date: 11/21/2023 Prepared by: Nechama Guard  Exercises - Supine Lower Trunk Rotation  - 1-2 x daily - 7 x weekly - 20 reps - 4-5 secs hold - Supine Piriformis Stretch with Foot on Ground  - 1-2 x daily - 7 x weekly - 2-3 reps - 20-30 secs hold - Seated 3 Way Exercise Ball Roll Out Stretch  - 1-2 x daily - 7 x weekly - 2-3 reps - 5 secs hold  ASSESSMENT:  CLINICAL IMPRESSION: Patient is a 82 y.o.f who was seen today for physical therapy evaluation and treatment for mid thoracic and midline/ Left sided low back pain. Her primary impairments include mobility deficits of the thoracic and lumbar spine, muscle performance deficits of the lumbopelvic musculature, balance deficts, and  pain. These deficits limit her ability to hike, sleep, do water aerobics, sit for prolonged periods, navigate stairs, and line dance without pain/limitation. Pt noticed relief when laying prone and with thoracic and lumbar PAs but had pain completing prone press ups. Pt would benefit from skilled care to address above impairments.   OBJECTIVE IMPAIRMENTS: decreased balance, decreased coordination, decreased endurance, decreased mobility, difficulty walking, decreased ROM, decreased strength, hypomobility, impaired flexibility, and pain.   ACTIVITY LIMITATIONS: carrying, lifting, bending, sitting, standing, squatting, sleeping, stairs, and locomotion level  PARTICIPATION LIMITATIONS: cleaning, laundry, community activity, and yard work  PERSONAL FACTORS: Past/current experiences, Time since onset of injury/illness/exacerbation, and 3+ comorbidities: HTN, chronic respirator failure with hypoxia, arthritis, & high cholesterol are also affecting patient's functional outcome.   REHAB POTENTIAL: Good  CLINICAL DECISION MAKING: Stable/uncomplicated  EVALUATION COMPLEXITY: Low   GOALS:  Goals reviewed with patient? Yes  SHORT TERM GOALS: Target date: 12/12/2023  Pt will be independent in initial HEP Goal status: INITIAL  2.  Patient will report at least 50% improvement in overall symptoms and/or function to demonstrate improved functional mobility Baseline: 0% Goal status: INITIAL  3.  Pt will report ability to sit for at least 10 minutes with less pain 4/10 pain Baseline: 8/10 pain Goal status: INITIAL   LONG TERM GOALS: Target date: 01/16/2024  Pt will be independent in final HEP. Baseline:  Goal status: INITIAL  2.  Pt will improve FOTO outcome measure to at least projected score to demonstrate improved function Baseline:  Goal status: INITIAL  3.  Pt will report 75% improvement in her ability to line dance without pain/limitation. Baseline: 0% Goal status: INITIAL  4.  Pt  will report 75% improvement in her ability to hike without pain/limitation. Baseline: 0% Goal status: INITIAL   PLAN:  PT FREQUENCY: 1-2x/week  PT DURATION: 8 weeks  PLANNED INTERVENTIONS: 97110-Therapeutic exercises, 97530- Therapeutic activity, 97112- Neuromuscular re-education, 97535- Self Care, 96045- Manual therapy, [phone]- Aquatic Therapy, [phone]- Ionotophoresis 4mg /ml Dexamethasone, Patient/Family education, Balance training, Stair training, Taping, Dry Needling, Joint mobilization, Joint manipulation, Spinal manipulation, Spinal mobilization, Vestibular training, Cryotherapy, and Moist heat.  PLAN FOR NEXT SESSION: STM/ PA to lumbar spine, lumbar mobility interventions, [place] FOTO, assess HEP   [employer]  Danyele Smejkal, Student-PT 11/21/2023, 10:51 AM  This entire session was performed under direct supervision and direction of a licensed therapist/therapist assistant . I have personally read, edited and approve of the note as written.  Sedalia Muta, PT, DPT 3:06 PM  11/22/23

## 2023-11-28 ENCOUNTER — Encounter: Payer: Self-pay | Admitting: Physical Therapy

## 2023-11-28 ENCOUNTER — Ambulatory Visit: Payer: Medicare HMO | Admitting: Physical Therapy

## 2023-11-28 DIAGNOSIS — M5459 Other low back pain: Secondary | ICD-10-CM

## 2023-11-28 DIAGNOSIS — M546 Pain in thoracic spine: Secondary | ICD-10-CM | POA: Diagnosis not present

## 2023-11-28 DIAGNOSIS — M2569 Stiffness of other specified joint, not elsewhere classified: Secondary | ICD-10-CM

## 2023-11-28 NOTE — Therapy (Unsigned)
OUTPATIENT PHYSICAL THERAPY THORACOLUMBAR TREATMENT   Patient Name: Maria [name] MRN: [id number] DOB:1941/02/23, 82 y.o., female Today's Date: 11/28/2023  END OF SESSION:  PT End of Session - 11/28/23 1107     Visit Number 2    Number of Visits 16    Date for PT Re-Evaluation 01/16/24    Authorization Type [employer]    Progress Note Due on Visit 10    PT Start Time 1106    PT Stop Time 1145    PT Time Calculation (min) 39 min    Activity Tolerance Patient tolerated treatment well;No increased pain    Behavior During Therapy WFL for tasks assessed/performed              Past Medical History:  Diagnosis Date   Anemia    Arthritis    Bronchitis    Carpal tunnel syndrome    in calves neuropathy   Diverticulosis    GERD (gastroesophageal reflux disease)    Headache(784.0)    chronic   High cholesterol    Hyperlipidemia    Hypertension    Migraine    Nephritis    OSA (obstructive sleep apnea)    Palpitations    Personal history of colonic adenomas 09/15/2010   Pyelonephritis, acute 09/23/2013   A. 10/14: Hospitalized [facility] for treatment     Stroke [facility])    TIA (transient ischemic attack)    Vertigo, benign paroxysmal    Past Surgical History:  Procedure Laterality Date   BLADDER REPAIR     BREAST BIOPSY     rt benign cyst   BREAST EXCISIONAL BIOPSY Right    CARPAL TUNNEL RELEASE     x 2   COLONOSCOPY     REPLACEMENT TOTAL KNEE BILATERAL     TONSILLECTOMY     UPPER GASTROINTESTINAL ENDOSCOPY     Patient Active Problem List   Diagnosis Date Noted   Overactive bladder 10/31/2023   Musculoskeletal back pain 10/31/2023   B12 deficiency 10/31/2023   History of prediabetes 04/23/2023   IPF (idiopathic pulmonary fibrosis) (HCC) 10/18/2021   Chronic respiratory failure with hypoxia (HCC) 10/18/2021   Iron deficiency 08/05/2020   Recurrent UTI 01/23/2020   Posttraumatic headache 03/03/2019   Dysuria 08/09/2018   Rash  08/09/2018   Vaginitis 08/09/2018   Periodic limb movements of sleep 08/15/2017   Exertional dyspnea 02/27/2017   GERD (gastroesophageal reflux disease) 02/27/2017   Edema 12/16/2015   Obesity 06/09/2015   Pulmonary fibrosis (HCC) 04/27/2015   Allergic rhinitis 03/19/2015   Wheezing 01/20/2015   History of esophageal stricture 09/29/2013   HTN (hypertension) 09/23/2013   Asthma 09/23/2013   chronic cough ? etiology  09/02/2013   Sinus bradycardia 04/09/2013   Impaired glucose tolerance 05/24/2011   Migraine    Preventative health care 05/21/2011   Hyperlipidemia    TIA (transient ischemic attack)    Vertigo, benign paroxysmal    Obstructive sleep apnea 03/22/2011   Paresthesias 03/22/2011   Insomnia 03/22/2011   History of colonic polyps 09/15/2010   ESOPHAGEAL STRICTURE 08/11/2010   GERD 08/11/2010    PCP: Allwardt, Crist Infante, PA-C  REFERRING PROVIDER: Allwardt, Crist Infante, PA-C  REFERRING DIAG:   Rationale for Evaluation and Treatment: Rehabilitation  THERAPY DIAG:  Pain in thoracic spine  Other low back pain  Back stiffness  ONSET DATE: Lumbar 10/2023, Thoracic unknown  SUBJECTIVE:  SUBJECTIVE STATEMENT: Pt states the last week she has been busy and moving around a lot which has helped decrease her LBP.   Eval: Pt states several years ago she began having insidious mid thoracic pain and it has not improved. In 10/2023, she began having lower back pain after raking the leaves in her yard that has continued to get worse. Her LBP occurs around the medial and the left side of her spine. Pt has difficulty with activities such as hiking, sleeping, water aerobics, sitting for prolonged periods of time, stairs, and line dancing. Pt uses supplemental oxygen to sleep and during activities  outside the home- although she able to complete activities for around an hour without using her supplemental oxygen.    PERTINENT HISTORY:  Arthritis, High cholesterol, HTN, Chronic respiratory failure with hypoxia  PAIN:  Are you having pain? Yes: NPRS scale: 8 /10 (with prolonged activity) Pain location: Midline and Left sided Low back Pain description: Sharp, tight,  Aggravating factors: Sitting, standing, sleeping Relieving factors: N/a   Are you having pain? Yes: NPRS scale: rest: 0/10 Pain location: Thoracic Pain description: Dull pain  Aggravating factors: Sitting, standing, sleeping Relieving factors: Massage on 10/19/23  PRECAUTIONS: None  RED FLAGS: None   WEIGHT BEARING RESTRICTIONS: No  FALLS:  Has patient fallen in last 6 months? No  LIVING ENVIRONMENT:  OCCUPATION: Retired  PLOF: Independent  PATIENT GOALS: Improve her ability with gardening, line dancing, hiking,   NEXT MD VISIT: n/a  OBJECTIVE:  Note: Objective measures were completed at Evaluation unless otherwise noted.  DIAGNOSTIC FINDINGS:  Scoliosis of spine   PATIENT SURVEYS:  FOTO (11/28/23): 57  SCREENING FOR RED FLAGS: Bowel or bladder incontinence: No Spinal tumors: No Cauda equina syndrome: No Compression fracture: No Abdominal aneurysm: No  COGNITION: Overall cognitive status: Within functional limits for tasks assessed     SENSATION: Not tested  MUSCLE LENGTH: Hamstrings: Right WFL deg; Left WFL deg Piriformis: Right WFL deg; Left WFL deg  POSTURE: rounded shoulders and decreased lumbar lordosis  PALPATION: TTP midline thoracic PS (around T7-T10), midline lumbar spine and L glutes   LOWER EXTREMITY ROM:     Hip ROM: WFL,  Knee: [facility]  LOWER EXTREMITY MMT:    MMT Right eval Left eval Right  11/28/23 Left  11/28/23  Hip flexion 4+ 4+ 4+ 4+  Hip extension      Hip abduction      Hip adduction   3+ 3+  Hip internal rotation      Hip external rotation       Knee flexion 4+ 4+ 4+ 4+  Knee extension      Ankle dorsiflexion      Ankle plantarflexion      Ankle inversion      Ankle eversion       (Blank rows = not tested) LUMBAR ROM:   Active  A/PROM  eval  Flexion WFL  Extension WFL  Right lateral flexion Min   Left lateral flexion min  Right rotation min  Left rotation mod   (Blank rows = not tested)     LUMBAR SPECIAL TESTS:    FUNCTIONAL TESTS:    GAIT: Distance walked: 25 ft Assistive device utilized: None Level of assistance: Complete Independence Comments: Min decreased arm swing, slight L hip hike  TODAY'S TREATMENT:  DATE: 11/28/2023  Therapeutic Exercise: Aerobic: Supine:  Marches with abdominal draw, bil x20 S/L: Clam shells bil 2x10 Seated: 3 way medicine ball rollouts x8 each way Prone: Cat cow x15 Standing:  Stretches: LTR bil 20x5"; Piriformis stretch bil 2x30" Neuromuscular Re-education:  Aerobic Supine: S/L: Prone: Standing   Manual Therapy: Lumbar & thoracic PAs, grades 3-4 Therapeutic Activity: Self Care:  Gait training  Modalities:   Previous  Therapeutic Exercise: Aerobic: Supine:  Seated: 3 way medicine ball rollouts x10 Standing: Stretches: LTR 20x5"; Piriformis stretch bil 2x30" Neuromuscular Re-education: Manual Therapy: Therapeutic Activity: Self Care:    PATIENT EDUCATION:  Education details: PT POC, Exam findings, HEP Person educated: Patient Education method: Explanation, Demonstration, Tactile cues, Verbal cues, and Handouts Education comprehension: verbalized understanding, returned demonstration, verbal cues required, tactile cues required, and needs further education    HOME EXERCISE PROGRAM: Access Code: [email or web address] URL: [email or web address] Date: 11/21/2023 Prepared by: Nechama Guard  Exercises - Supine Lower Trunk  Rotation  - 1-2 x daily - 7 x weekly - 20 reps - 4-5 secs hold - Supine Piriformis Stretch with Foot on Ground  - 1-2 x daily - 7 x weekly - 2-3 reps - 20-30 secs hold - Seated 3 Way Exercise Ball Roll Out Stretch  - 1-2 x daily - 7 x weekly - 2-3 reps - 5 secs hold  ASSESSMENT:  CLINICAL IMPRESSION: Session focused on manual therapy and ther ex to promote global lumbopelvic mobility and strengthening. Pt presents with greater thoracolumbar stiffness from the upper lumbar through the majority of her thoracic spine than is present in the mid/lower lumbar but was tender and sore in her lower lumbar spine. Pt continues to have difficulty with thoracolumbar mobility and lumbopelvic musculature control & strength -although steady progress is being made. Continue to promote global thoracolumbar mobility and lumbopelvic musculature strengthening as tolerated. Pt able to perform all exercises without increased pain or modification.    Eval: Patient is a 82 y.o.f who was seen today for physical therapy evaluation and treatment for mid thoracic and midline/ Left sided low back pain. Her primary impairments include mobility deficits of the thoracic and lumbar spine, muscle performance deficits of the lumbopelvic musculature, balance deficts, and pain. These deficits limit her ability to hike, sleep, do water aerobics, sit for prolonged periods, navigate stairs, and line dance without pain/limitation. Pt noticed relief when laying prone and with thoracic and lumbar PAs but had pain completing prone press ups. Pt would benefit from skilled care to address above impairments.   OBJECTIVE IMPAIRMENTS: decreased balance, decreased coordination, decreased endurance, decreased mobility, difficulty walking, decreased ROM, decreased strength, hypomobility, impaired flexibility, and pain.   ACTIVITY LIMITATIONS: carrying, lifting, bending, sitting, standing, squatting, sleeping, stairs, and locomotion level  PARTICIPATION  LIMITATIONS: cleaning, laundry, community activity, and yard work  PERSONAL FACTORS: Past/current experiences, Time since onset of injury/illness/exacerbation, and 3+ comorbidities: HTN, chronic respirator failure with hypoxia, arthritis, & high cholesterol are also affecting patient's functional outcome.   REHAB POTENTIAL: Good  CLINICAL DECISION MAKING: Stable/uncomplicated  EVALUATION COMPLEXITY: Low   GOALS:  Goals reviewed with patient? Yes  SHORT TERM GOALS: Target date: 12/12/2023  Pt will be independent in initial HEP Goal status: INITIAL  2.  Patient will report at least 50% improvement in overall symptoms and/or function to demonstrate improved functional mobility Baseline: 0% Goal status: INITIAL  3.  Pt will report ability to sit for at least 10 minutes with less pain 4/10 pain Baseline: 8/10 pain Goal  status: INITIAL   LONG TERM GOALS: Target date: 01/16/2024  Pt will be independent in final HEP. Baseline:  Goal status: INITIAL  2.  Pt will improve FOTO outcome measure to at least projected score to demonstrate improved function Baseline:  Goal status: INITIAL  3.  Pt will report 75% improvement in her ability to line dance without pain/limitation. Baseline: 0% Goal status: INITIAL  4.  Pt will report 75% improvement in her ability to hike without pain/limitation. Baseline: 0% Goal status: INITIAL   PLAN:  PT FREQUENCY: 1-2x/week  PT DURATION: 8 weeks  PLANNED INTERVENTIONS: 97110-Therapeutic exercises, 97530- Therapeutic activity, 97112- Neuromuscular re-education, 97535- Self Care, 16109- Manual therapy, [phone]- Aquatic Therapy, [phone]- Ionotophoresis 4mg /ml Dexamethasone, Patient/Family education, Balance training, Stair training, Taping, Dry Needling, Joint mobilization, Joint manipulation, Spinal manipulation, Spinal mobilization, Vestibular training, Cryotherapy, and Moist heat.  PLAN FOR NEXT SESSION: STM/ PA to lumbar spine, lumbar mobility  interventions. Pt may benefit from squatting and lifting mechanics review    [employer], Student-PT 11/28/2023, 12:00 PM  This entire session was performed under direct supervision and direction of a licensed therapist/therapist assistant . I have personally read, edited and approve of the note as written.  Sedalia Muta, PT, DPT 12:00 PM  11/28/23

## 2023-12-05 ENCOUNTER — Ambulatory Visit: Payer: Medicare HMO | Admitting: Physical Therapy

## 2023-12-05 ENCOUNTER — Encounter: Payer: Self-pay | Admitting: Physical Therapy

## 2023-12-05 DIAGNOSIS — M2569 Stiffness of other specified joint, not elsewhere classified: Secondary | ICD-10-CM

## 2023-12-05 DIAGNOSIS — M5459 Other low back pain: Secondary | ICD-10-CM | POA: Diagnosis not present

## 2023-12-05 DIAGNOSIS — M546 Pain in thoracic spine: Secondary | ICD-10-CM | POA: Diagnosis not present

## 2023-12-05 NOTE — Therapy (Signed)
OUTPATIENT PHYSICAL THERAPY THORACOLUMBAR TREATMENT   Patient Name: Maria [name] MRN: [id number] DOB:[birth year], 82 y.o., female Today's Date: 12/05/2023  END OF SESSION:  PT End of Session - 12/05/23 1409     Visit Number 3    Number of Visits 16    Date for PT Re-Evaluation 01/16/24    Authorization Type [employer]    Progress Note Due on Visit 10    PT Start Time 0933    PT Stop Time 1015    PT Time Calculation (min) 42 min    Activity Tolerance Patient tolerated treatment well;No increased pain    Behavior During Therapy WFL for tasks assessed/performed               Past Medical History:  Diagnosis Date   Anemia    Arthritis    Bronchitis    Carpal tunnel syndrome    in calves neuropathy   Diverticulosis    GERD (gastroesophageal reflux disease)    Headache(784.0)    chronic   High cholesterol    Hyperlipidemia    Hypertension    Migraine    Nephritis    OSA (obstructive sleep apnea)    Palpitations    Personal history of colonic adenomas 09/15/2010   Pyelonephritis, acute 09/23/2013   A. 10/14: Hospitalized [facility] for treatment     Stroke [facility])    TIA (transient ischemic attack)    Vertigo, benign paroxysmal    Past Surgical History:  Procedure Laterality Date   BLADDER REPAIR     BREAST BIOPSY     rt benign cyst   BREAST EXCISIONAL BIOPSY Right    CARPAL TUNNEL RELEASE     x 2   COLONOSCOPY     REPLACEMENT TOTAL KNEE BILATERAL     TONSILLECTOMY     UPPER GASTROINTESTINAL ENDOSCOPY     Patient Active Problem List   Diagnosis Date Noted   Overactive bladder 10/31/2023   Musculoskeletal back pain 10/31/2023   B12 deficiency 10/31/2023   History of prediabetes 04/23/2023   IPF (idiopathic pulmonary fibrosis) (HCC) 10/18/2021   Chronic respiratory failure with hypoxia (HCC) 10/18/2021   Iron deficiency 08/05/2020   Recurrent UTI 01/23/2020   Posttraumatic headache 03/03/2019   Dysuria 08/09/2018   Rash  08/09/2018   Vaginitis 08/09/2018   Periodic limb movements of sleep 08/15/2017   Exertional dyspnea 02/27/2017   GERD (gastroesophageal reflux disease) 02/27/2017   Edema 12/16/2015   Obesity 06/09/2015   Pulmonary fibrosis (HCC) 04/27/2015   Allergic rhinitis 03/19/2015   Wheezing 01/20/2015   History of esophageal stricture 09/29/2013   HTN (hypertension) 09/23/2013   Asthma 09/23/2013   chronic cough ? etiology  09/02/2013   Sinus bradycardia 04/09/2013   Impaired glucose tolerance 05/24/2011   Migraine    Preventative health care 05/21/2011   Hyperlipidemia    TIA (transient ischemic attack)    Vertigo, benign paroxysmal    Obstructive sleep apnea 03/22/2011   Paresthesias 03/22/2011   Insomnia 03/22/2011   History of colonic polyps 09/15/2010   ESOPHAGEAL STRICTURE 08/11/2010   GERD 08/11/2010    PCP: Allwardt, Crist Infante, PA-C  REFERRING PROVIDER: Allwardt, Crist Infante, PA-C  REFERRING DIAG:   Rationale for Evaluation and Treatment: Rehabilitation  THERAPY DIAG:  Pain in thoracic spine  Other low back pain  Back stiffness  ONSET DATE: Lumbar 10/2023, Thoracic unknown  SUBJECTIVE:  SUBJECTIVE STATEMENT: Back not too bad today, still hurts with sitting too long in chair at home or in car. And if she over does activity..   Eval: Pt states several years ago she began having insidious mid thoracic pain and it has not improved. In 10/2023, she began having lower back pain after raking the leaves in her yard that has continued to get worse. Her LBP occurs around the medial and the left side of her spine. Pt has difficulty with activities such as hiking, sleeping, water aerobics, sitting for prolonged periods of time, stairs, and line dancing. Pt uses supplemental oxygen to sleep and  during activities outside the home- although she able to complete activities for around an hour without using her supplemental oxygen.    PERTINENT HISTORY:  Arthritis, High cholesterol, HTN, Chronic respiratory failure with hypoxia  PAIN:  Are you having pain? Yes: NPRS scale: 8 /10 (with prolonged activity) Pain location: Midline and Left sided Low back Pain description: Sharp, tight,  Aggravating factors: Sitting, standing, sleeping Relieving factors: N/a   Are you having pain? Yes: NPRS scale: rest: 0/10 Pain location: Thoracic Pain description: Dull pain  Aggravating factors: Sitting, standing, sleeping Relieving factors: Massage on 10/19/23  PRECAUTIONS: None  RED FLAGS: None   WEIGHT BEARING RESTRICTIONS: No  FALLS:  Has patient fallen in last 6 months? No  LIVING ENVIRONMENT:  OCCUPATION: Retired  PLOF: Independent  PATIENT GOALS: Improve her ability with gardening, line dancing, hiking,   NEXT MD VISIT: n/a  OBJECTIVE:  Note: Objective measures were completed at Evaluation unless otherwise noted.  DIAGNOSTIC FINDINGS:  Scoliosis of spine   PATIENT SURVEYS:  FOTO (11/28/23): 57  SCREENING FOR RED FLAGS: Bowel or bladder incontinence: No Spinal tumors: No Cauda equina syndrome: No Compression fracture: No Abdominal aneurysm: No  COGNITION: Overall cognitive status: Within functional limits for tasks assessed     SENSATION: Not tested  MUSCLE LENGTH: Hamstrings: Right WFL deg; Left WFL deg Piriformis: Right WFL deg; Left WFL deg  POSTURE: rounded shoulders and decreased lumbar lordosis  PALPATION: TTP midline thoracic PS (around T7-T10), midline lumbar spine and L glutes   LOWER EXTREMITY ROM:     Hip ROM: WFL,  Knee: [facility]  LOWER EXTREMITY MMT:    MMT Right eval Left eval Right  11/28/23 Left  11/28/23  Hip flexion 4+ 4+ 4+ 4+  Hip extension      Hip abduction      Hip adduction   3+ 3+  Hip internal rotation      Hip external  rotation      Knee flexion 4+ 4+ 4+ 4+  Knee extension      Ankle dorsiflexion      Ankle plantarflexion      Ankle inversion      Ankle eversion       (Blank rows = not tested) LUMBAR ROM:   Active  A/PROM  eval  Flexion WFL  Extension WFL  Right lateral flexion Min   Left lateral flexion min  Right rotation min  Left rotation mod   (Blank rows = not tested)     LUMBAR SPECIAL TESTS:    FUNCTIONAL TESTS:    GAIT: Distance walked: 25 ft Assistive device utilized: None Level of assistance: Complete Independence Comments: Min decreased arm swing, slight L hip hike  TODAY'S TREATMENT:  DATE:   12/05/2023 Therapeutic Exercise: Aerobic: Supine:  pelvic tilts x 15;    review for TA contraction x 5;   Marches with TA  bil x20;  Bridging 2 x 10;   S/L: Clam shells bil 2x10 Seated: 3 way medicine ball rollouts x8 each way Prone: Cat cow x15 Standing:  Stretches: LTR bil 15 x5";  Piriformis stretch bil 2x30" Neuromuscular Re-education:  Manual Therapy: Lumbar & thoracic PAs, grades 3-4 Therapeutic Activity: Self Care:  Gait training  Modalities:   Previous  Therapeutic Exercise: Aerobic: Supine:  Seated: 3 way medicine ball rollouts x10 Standing: Stretches: LTR 20x5"; Piriformis stretch bil 2x30" Neuromuscular Re-education: Manual Therapy: Therapeutic Activity: Self Care:    PATIENT EDUCATION:  Education details: PT POC, Exam findings, HEP Person educated: Patient Education method: Explanation, Demonstration, Tactile cues, Verbal cues, and Handouts Education comprehension: verbalized understanding, returned demonstration, verbal cues required, tactile cues required, and needs further education    HOME EXERCISE PROGRAM: Access Code: [email or web address] URL: [email or web address] Date: 11/21/2023 Prepared by: Nechama Guard  Exercises - Supine Lower Trunk Rotation  - 1-2 x daily - 7 x weekly - 20 reps - 4-5 secs hold - Supine Piriformis Stretch with Foot on Ground  - 1-2 x daily - 7 x weekly - 2-3 reps - 20-30 secs hold - Seated 3 Way Exercise Ball Roll Out Stretch  - 1-2 x daily - 7 x weekly - 2-3 reps - 5 secs hold  ASSESSMENT:  CLINICAL IMPRESSION:  Pt with minimal pain with ther ex today. She was able to progress light core strength, review for TA and pt able to achieve good contraction. Will benefit from more education on TA with bending, IADLS in future sessions. Reviewed mechanics for a few of her water aerobic exercises, and to avoid repetitive twisting with this.    Eval: Patient is a 82 y.o.f who was seen today for physical therapy evaluation and treatment for mid thoracic and midline/ Left sided low back pain. Her primary impairments include mobility deficits of the thoracic and lumbar spine, muscle performance deficits of the lumbopelvic musculature, balance deficts, and pain. These deficits limit her ability to hike, sleep, do water aerobics, sit for prolonged periods, navigate stairs, and line dance without pain/limitation. Pt noticed relief when laying prone and with thoracic and lumbar PAs but had pain completing prone press ups. Pt would benefit from skilled care to address above impairments.   OBJECTIVE IMPAIRMENTS: decreased balance, decreased coordination, decreased endurance, decreased mobility, difficulty walking, decreased ROM, decreased strength, hypomobility, impaired flexibility, and pain.   ACTIVITY LIMITATIONS: carrying, lifting, bending, sitting, standing, squatting, sleeping, stairs, and locomotion level  PARTICIPATION LIMITATIONS: cleaning, laundry, community activity, and yard work  PERSONAL FACTORS: Past/current experiences, Time since onset of injury/illness/exacerbation, and 3+ comorbidities: HTN, chronic respirator failure with hypoxia, arthritis, & high cholesterol are  also affecting patient's functional outcome.   REHAB POTENTIAL: Good  CLINICAL DECISION MAKING: Stable/uncomplicated  EVALUATION COMPLEXITY: Low   GOALS:  Goals reviewed with patient? Yes  SHORT TERM GOALS: Target date: 12/12/2023  Pt will be independent in initial HEP Goal status: INITIAL  2.  Patient will report at least 50% improvement in overall symptoms and/or function to demonstrate improved functional mobility Baseline: 0% Goal status: INITIAL  3.  Pt will report ability to sit for at least 10 minutes with less pain 4/10 pain Baseline: 8/10 pain Goal status: INITIAL   LONG TERM GOALS: Target date: 01/16/2024  Pt will be independent in final  HEP. Baseline:  Goal status: INITIAL  2.  Pt will improve FOTO outcome measure to at least projected score to demonstrate improved function Baseline:  Goal status: INITIAL  3.  Pt will report 75% improvement in her ability to line dance without pain/limitation. Baseline: 0% Goal status: INITIAL  4.  Pt will report 75% improvement in her ability to hike without pain/limitation. Baseline: 0% Goal status: INITIAL   PLAN:  PT FREQUENCY: 1-2x/week  PT DURATION: 8 weeks  PLANNED INTERVENTIONS: 97110-Therapeutic exercises, 97530- Therapeutic activity, 97112- Neuromuscular re-education, 97535- Self Care, [id number]- Manual therapy, [phone]- Aquatic Therapy, [phone]- Ionotophoresis 4mg /ml Dexamethasone, Patient/Family education, Balance training, Stair training, Taping, Dry Needling, Joint mobilization, Joint manipulation, Spinal manipulation, Spinal mobilization, Vestibular training, Cryotherapy, and Moist heat.  PLAN FOR NEXT SESSION: STM/ PA to lumbar spine, lumbar mobility interventions. Pt may benefit from squatting and lifting mechanics review  Bend, lift, squat, sit to stands with TA.    Sedalia Muta, PT, DPT 2:10 PM  12/05/23

## 2023-12-10 ENCOUNTER — Ambulatory Visit: Payer: Medicare HMO | Admitting: Physical Therapy

## 2023-12-10 ENCOUNTER — Encounter: Payer: Self-pay | Admitting: Physical Therapy

## 2023-12-10 DIAGNOSIS — M5459 Other low back pain: Secondary | ICD-10-CM

## 2023-12-10 DIAGNOSIS — M2569 Stiffness of other specified joint, not elsewhere classified: Secondary | ICD-10-CM | POA: Diagnosis not present

## 2023-12-10 DIAGNOSIS — M546 Pain in thoracic spine: Secondary | ICD-10-CM | POA: Diagnosis not present

## 2023-12-10 NOTE — Therapy (Signed)
OUTPATIENT PHYSICAL THERAPY THORACOLUMBAR TREATMENT   Patient Name: Maria [name] MRN: [id number] DOB:August 03, 1941, 82 y.o., female Today's Date: 12/10/2023  END OF SESSION:  PT End of Session - 12/10/23 0950     Visit Number 4    Number of Visits 16    Date for PT Re-Evaluation 01/16/24    Authorization Type [employer]    Progress Note Due on Visit 10    PT Start Time 0932    PT Stop Time 1016    PT Time Calculation (min) 44 min    Activity Tolerance Patient tolerated treatment well;No increased pain    Behavior During Therapy WFL for tasks assessed/performed                Past Medical History:  Diagnosis Date   Anemia    Arthritis    Bronchitis    Carpal tunnel syndrome    in calves neuropathy   Diverticulosis    GERD (gastroesophageal reflux disease)    Headache(784.0)    chronic   High cholesterol    Hyperlipidemia    Hypertension    Migraine    Nephritis    OSA (obstructive sleep apnea)    Palpitations    Personal history of colonic adenomas 09/15/2010   Pyelonephritis, acute 09/23/2013   A. 10/14: Hospitalized [facility] for treatment     Stroke [facility])    TIA (transient ischemic attack)    Vertigo, benign paroxysmal    Past Surgical History:  Procedure Laterality Date   BLADDER REPAIR     BREAST BIOPSY     rt benign cyst   BREAST EXCISIONAL BIOPSY Right    CARPAL TUNNEL RELEASE     x 2   COLONOSCOPY     REPLACEMENT TOTAL KNEE BILATERAL     TONSILLECTOMY     UPPER GASTROINTESTINAL ENDOSCOPY     Patient Active Problem List   Diagnosis Date Noted   Overactive bladder 10/31/2023   Musculoskeletal back pain 10/31/2023   B12 deficiency 10/31/2023   History of prediabetes 04/23/2023   IPF (idiopathic pulmonary fibrosis) (HCC) 10/18/2021   Chronic respiratory failure with hypoxia (HCC) 10/18/2021   Iron deficiency 08/05/2020   Recurrent UTI 01/23/2020   Posttraumatic headache 03/03/2019   Dysuria 08/09/2018   Rash  08/09/2018   Vaginitis 08/09/2018   Periodic limb movements of sleep 08/15/2017   Exertional dyspnea 02/27/2017   GERD (gastroesophageal reflux disease) 02/27/2017   Edema 12/16/2015   Obesity 06/09/2015   Pulmonary fibrosis (HCC) 04/27/2015   Allergic rhinitis 03/19/2015   Wheezing 01/20/2015   History of esophageal stricture 09/29/2013   HTN (hypertension) 09/23/2013   Asthma 09/23/2013   chronic cough ? etiology  09/02/2013   Sinus bradycardia 04/09/2013   Impaired glucose tolerance 05/24/2011   Migraine    Preventative health care 05/21/2011   Hyperlipidemia    TIA (transient ischemic attack)    Vertigo, benign paroxysmal    Obstructive sleep apnea 03/22/2011   Paresthesias 03/22/2011   Insomnia 03/22/2011   History of colonic polyps 09/15/2010   ESOPHAGEAL STRICTURE 08/11/2010   GERD 08/11/2010    PCP: Allwardt, Crist Infante, PA-C  REFERRING PROVIDER: Allwardt, Crist Infante, PA-C  REFERRING DIAG:   Rationale for Evaluation and Treatment: Rehabilitation  THERAPY DIAG:  Pain in thoracic spine  Other low back pain  Back stiffness  ONSET DATE: Lumbar 10/2023, Thoracic unknown  SUBJECTIVE:  SUBJECTIVE STATEMENT: Rode in car yesterday to [place], back felt stiff. Pain not too bad today.   Eval: Pt states several years ago she began having insidious mid thoracic pain and it has not improved. In 10/2023, she began having lower back pain after raking the leaves in her yard that has continued to get worse. Her LBP occurs around the medial and the left side of her spine. Pt has difficulty with activities such as hiking, sleeping, water aerobics, sitting for prolonged periods of time, stairs, and line dancing. Pt uses supplemental oxygen to sleep and during activities outside the home- although  she able to complete activities for around an hour without using her supplemental oxygen.    PERTINENT HISTORY:  Arthritis, High cholesterol, HTN, Chronic respiratory failure with hypoxia  PAIN:  Are you having pain? Yes: NPRS scale: 8 /10 (with prolonged activity) Pain location: Midline and Left sided Low back Pain description: Sharp, tight,  Aggravating factors: Sitting, standing, sleeping Relieving factors: N/a   Are you having pain? Yes: NPRS scale: rest: 0/10 Pain location: Thoracic Pain description: Dull pain  Aggravating factors: Sitting, standing, sleeping Relieving factors: Massage on 10/19/23  PRECAUTIONS: None  RED FLAGS: None   WEIGHT BEARING RESTRICTIONS: No  FALLS:  Has patient fallen in last 6 months? No  LIVING ENVIRONMENT:  OCCUPATION: Retired  PLOF: Independent  PATIENT GOALS: Improve her ability with gardening, line dancing, hiking,   NEXT MD VISIT: n/a  OBJECTIVE:  Note: Objective measures were completed at Evaluation unless otherwise noted.  DIAGNOSTIC FINDINGS:  Scoliosis of spine   PATIENT SURVEYS:  FOTO (11/28/23): 57  SCREENING FOR RED FLAGS: Bowel or bladder incontinence: No Spinal tumors: No Cauda equina syndrome: No Compression fracture: No Abdominal aneurysm: No  COGNITION: Overall cognitive status: Within functional limits for tasks assessed     SENSATION: Not tested  MUSCLE LENGTH: Hamstrings: Right WFL deg; Left WFL deg Piriformis: Right WFL deg; Left WFL deg  POSTURE: rounded shoulders and decreased lumbar lordosis  PALPATION: TTP midline thoracic PS (around T7-T10), midline lumbar spine and L glutes   LOWER EXTREMITY ROM:     Hip ROM: WFL,  Knee: [facility]  LOWER EXTREMITY MMT:    MMT Right eval Left eval Right  11/28/23 Left  11/28/23  Hip flexion 4+ 4+ 4+ 4+  Hip extension      Hip abduction      Hip adduction   3+ 3+  Hip internal rotation      Hip external rotation      Knee flexion 4+ 4+ 4+ 4+   Knee extension      Ankle dorsiflexion      Ankle plantarflexion      Ankle inversion      Ankle eversion       (Blank rows = not tested) LUMBAR ROM:   Active  A/PROM  eval  Flexion WFL  Extension WFL  Right lateral flexion Min   Left lateral flexion min  Right rotation min  Left rotation mod   (Blank rows = not tested)     LUMBAR SPECIAL TESTS:    FUNCTIONAL TESTS:    GAIT: Distance walked: 25 ft Assistive device utilized: None Level of assistance: Complete Independence Comments: Min decreased arm swing, slight L hip hike  TODAY'S TREATMENT:  DATE:    12/10/2023 Therapeutic Exercise: Aerobic: Supine:  pelvic tilts x 15;   review for TA contraction x 5;   Marches with TA  bil x20;   Bridging 2 x 8 ;   S/L:  hip abd 2 x 10;  Seated:   sit to stand 2 x 10 from regular chair- cueing for TA with transfer.  Prone:  Standing:  Rows GTB 2 x 10 cueing for form;  Stretches:  Piriformis stretch bil 2x30" Neuromuscular Re-education:  Manual Therapy: Lumbar & thoracic PAs, grades 3-4 Therapeutic Activity: Self Care:  Gait training  Modalities:   Previous  Therapeutic Exercise: Aerobic: Supine:  Seated: 3 way medicine ball rollouts x10 Standing: Stretches: LTR 20x5"; Piriformis stretch bil 2x30" Neuromuscular Re-education: Manual Therapy: Therapeutic Activity: Self Care:    PATIENT EDUCATION:  Education details: updated and reviewed HEP Person educated: Patient Education method: Explanation, Demonstration, Tactile cues, Verbal cues, and Handouts Education comprehension: verbalized understanding, returned demonstration, verbal cues required, tactile cues required, and needs further education    HOME EXERCISE PROGRAM: Access Code: [email or web address]   ASSESSMENT:  CLINICAL IMPRESSION: Focus for ther ex for strengthening today. Reviewed  TA and importance of core contraction. PT with improving ability for this. Reviewed importance of this with IADLs, bending and transfers. Will benefit from continued strengthening as tolerated, and education on managing low back pain.   Eval: Patient is a 82 y.o.f who was seen today for physical therapy evaluation and treatment for mid thoracic and midline/ Left sided low back pain. Her primary impairments include mobility deficits of the thoracic and lumbar spine, muscle performance deficits of the lumbopelvic musculature, balance deficts, and pain. These deficits limit her ability to hike, sleep, do water aerobics, sit for prolonged periods, navigate stairs, and line dance without pain/limitation. Pt noticed relief when laying prone and with thoracic and lumbar PAs but had pain completing prone press ups. Pt would benefit from skilled care to address above impairments.   OBJECTIVE IMPAIRMENTS: decreased balance, decreased coordination, decreased endurance, decreased mobility, difficulty walking, decreased ROM, decreased strength, hypomobility, impaired flexibility, and pain.   ACTIVITY LIMITATIONS: carrying, lifting, bending, sitting, standing, squatting, sleeping, stairs, and locomotion level  PARTICIPATION LIMITATIONS: cleaning, laundry, community activity, and yard work  PERSONAL FACTORS: Past/current experiences, Time since onset of injury/illness/exacerbation, and 3+ comorbidities: HTN, chronic respirator failure with hypoxia, arthritis, & high cholesterol are also affecting patient's functional outcome.   REHAB POTENTIAL: Good  CLINICAL DECISION MAKING: Stable/uncomplicated  EVALUATION COMPLEXITY: Low   GOALS:  Goals reviewed with patient? Yes  SHORT TERM GOALS: Target date: 12/12/2023  Pt will be independent in initial HEP Goal status: INITIAL  2.  Patient will report at least 50% improvement in overall symptoms and/or function to demonstrate improved functional  mobility Baseline: 0% Goal status: INITIAL  3.  Pt will report ability to sit for at least 10 minutes with less pain 4/10 pain Baseline: 8/10 pain Goal status: INITIAL   LONG TERM GOALS: Target date: 01/16/2024  Pt will be independent in final HEP. Baseline:  Goal status: INITIAL  2.  Pt will improve FOTO outcome measure to at least projected score to demonstrate improved function Baseline:  Goal status: INITIAL  3.  Pt will report 75% improvement in her ability to line dance without pain/limitation. Baseline: 0% Goal status: INITIAL  4.  Pt will report 75% improvement in her ability to hike without pain/limitation. Baseline: 0% Goal status: INITIAL   PLAN:  PT FREQUENCY: 1-2x/week  PT DURATION: 8 weeks  PLANNED INTERVENTIONS: 97110-Therapeutic exercises, 97530- Therapeutic activity, 97112- Neuromuscular re-education, 97535- Self Care, 11914- Manual therapy, [phone]- Aquatic Therapy, [phone]- Ionotophoresis 4mg /ml Dexamethasone, Patient/Family education, Balance training, Stair training, Taping, Dry Needling, Joint mobilization, Joint manipulation, Spinal manipulation, Spinal mobilization, Vestibular training, Cryotherapy, and Moist heat.  PLAN FOR NEXT SESSION: STM/ PA to lumbar spine, lumbar mobility interventions. Pt may benefit from squatting and lifting mechanics review  Bend, lift, squat, sit to stands with TA.    Sedalia Muta, PT, DPT 12:45 PM  12/10/23

## 2023-12-17 ENCOUNTER — Ambulatory Visit: Payer: Medicare HMO | Admitting: Physical Therapy

## 2023-12-17 ENCOUNTER — Other Ambulatory Visit: Payer: Self-pay | Admitting: Physician Assistant

## 2023-12-17 ENCOUNTER — Encounter: Payer: Self-pay | Admitting: Physical Therapy

## 2023-12-17 DIAGNOSIS — M546 Pain in thoracic spine: Secondary | ICD-10-CM

## 2023-12-17 DIAGNOSIS — M5459 Other low back pain: Secondary | ICD-10-CM

## 2023-12-17 DIAGNOSIS — M2569 Stiffness of other specified joint, not elsewhere classified: Secondary | ICD-10-CM | POA: Diagnosis not present

## 2023-12-17 NOTE — Therapy (Addendum)
 OUTPATIENT PHYSICAL THERAPY THORACOLUMBAR TREATMENT   Patient Name: Maria [name] MRN: [id number] DOB:05/04/1941, 82 y.o., female Today's Date: 12/17/2023  END OF SESSION:  PT End of Session - 12/17/23 0957     Visit Number 5    Number of Visits 16    Date for PT Re-Evaluation 01/16/24    Authorization Type [employer]    Progress Note Due on Visit 10    PT Start Time [phone]    PT Stop Time 1015    PT Time Calculation (min) 34 min    Activity Tolerance Patient tolerated treatment well;No increased pain    Behavior During Therapy WFL for tasks assessed/performed               Past Medical History:  Diagnosis Date   Anemia    Arthritis    Bronchitis    Carpal tunnel syndrome    in calves neuropathy   Diverticulosis    GERD (gastroesophageal reflux disease)    Headache(784.0)    chronic   High cholesterol    Hyperlipidemia    Hypertension    Migraine    Nephritis    OSA (obstructive sleep apnea)    Palpitations    Personal history of colonic adenomas 09/15/2010   Pyelonephritis, acute 09/23/2013   A. 10/14: Hospitalized [facility] for treatment     Stroke [facility])    TIA (transient ischemic attack)    Vertigo, benign paroxysmal    Past Surgical History:  Procedure Laterality Date   BLADDER REPAIR     BREAST BIOPSY     rt benign cyst   BREAST EXCISIONAL BIOPSY Right    CARPAL TUNNEL RELEASE     x 2   COLONOSCOPY     REPLACEMENT TOTAL KNEE BILATERAL     TONSILLECTOMY     UPPER GASTROINTESTINAL ENDOSCOPY     Patient Active Problem List   Diagnosis Date Noted   Overactive bladder 10/31/2023   Musculoskeletal back pain 10/31/2023   B12 deficiency 10/31/2023   History of prediabetes 04/23/2023   IPF (idiopathic pulmonary fibrosis) (HCC) 10/18/2021   Chronic respiratory failure with hypoxia (HCC) 10/18/2021   Iron deficiency 08/05/2020   Recurrent UTI 01/23/2020   Posttraumatic headache 03/03/2019   Dysuria 08/09/2018   Rash  08/09/2018   Vaginitis 08/09/2018   Periodic limb movements of sleep 08/15/2017   Exertional dyspnea 02/27/2017   GERD (gastroesophageal reflux disease) 02/27/2017   Edema 12/16/2015   Obesity 06/09/2015   Pulmonary fibrosis (HCC) 04/27/2015   Allergic rhinitis 03/19/2015   Wheezing 01/20/2015   History of esophageal stricture 09/29/2013   HTN (hypertension) 09/23/2013   Asthma 09/23/2013   chronic cough ? etiology  09/02/2013   Sinus bradycardia 04/09/2013   Impaired glucose tolerance 05/24/2011   Migraine    Preventative health care 05/21/2011   Hyperlipidemia    TIA (transient ischemic attack)    Vertigo, benign paroxysmal    Obstructive sleep apnea 03/22/2011   Paresthesias 03/22/2011   Insomnia 03/22/2011   History of colonic polyps 09/15/2010   ESOPHAGEAL STRICTURE 08/11/2010   GERD 08/11/2010    PCP: Allwardt, Deleta Felix, PA-C  REFERRING PROVIDER: Allwardt, Deleta Felix, PA-C  REFERRING DIAG:   Rationale for Evaluation and Treatment: Rehabilitation  THERAPY DIAG:  Pain in thoracic spine  Other low back pain  Back stiffness  ONSET DATE: Lumbar 10/2023, Thoracic unknown  SUBJECTIVE:  SUBJECTIVE STATEMENT: Pt states increased pain over the weekend when she did lots of standing and cooking and entertaining. Today she has minimal pain. Sitting in car or too long still makes pain in low back on R.   Eval: Pt states several years ago she began having insidious mid thoracic pain and it has not improved. In 10/2023, she began having lower back pain after raking the leaves in her yard that has continued to get worse. Her LBP occurs around the medial and the left side of her spine. Pt has difficulty with activities such as hiking, sleeping, water aerobics, sitting for prolonged periods of time,  stairs, and line dancing. Pt uses supplemental oxygen  to sleep and during activities outside the home- although she able to complete activities for around an hour without using her supplemental oxygen .    PERTINENT HISTORY:  Arthritis, High cholesterol, HTN, Chronic respiratory failure with hypoxia  PAIN:  Are you having pain? Yes: NPRS scale: 8 /10 (with prolonged activity) Pain location: Midline and Left sided Low back Pain description: Sharp, tight,  Aggravating factors: Sitting, standing, sleeping Relieving factors: N/a   Are you having pain? Yes: NPRS scale: rest: 0/10 Pain location: Thoracic Pain description: Dull pain  Aggravating factors: Sitting, standing, sleeping Relieving factors: Massage on 10/19/23  PRECAUTIONS: None  RED FLAGS: None   WEIGHT BEARING RESTRICTIONS: No  FALLS:  Has patient fallen in last 6 months? No  LIVING ENVIRONMENT:  OCCUPATION: Retired  PLOF: Independent  PATIENT GOALS: Improve her ability with gardening, line dancing, hiking,   NEXT MD VISIT: n/a  OBJECTIVE:  Note: Objective measures were completed at Evaluation unless otherwise noted.  DIAGNOSTIC FINDINGS:  Scoliosis of spine   PATIENT SURVEYS:  FOTO (11/28/23): 57     SCREENING FOR RED FLAGS: Bowel or bladder incontinence: No Spinal tumors: No Cauda equina syndrome: No Compression fracture: No Abdominal aneurysm: No  COGNITION: Overall cognitive status: Within functional limits for tasks assessed     SENSATION: Not tested  MUSCLE LENGTH: Hamstrings: Right WFL deg; Left WFL deg Piriformis: Right WFL deg; Left WFL deg  POSTURE: rounded shoulders and decreased lumbar lordosis  PALPATION: TTP midline thoracic PS (around T7-T10), midline lumbar spine and L glutes   LOWER EXTREMITY ROM:     Hip ROM: WFL,  Knee: [facility]  LOWER EXTREMITY MMT:    MMT Right eval Left eval Right  11/28/23 Left  11/28/23  Hip flexion 4+ 4+ 4+ 4+  Hip extension      Hip abduction       Hip adduction   3+ 3+  Hip internal rotation      Hip external rotation      Knee flexion 4+ 4+ 4+ 4+  Knee extension      Ankle dorsiflexion      Ankle plantarflexion      Ankle inversion      Ankle eversion       (Blank rows = not tested) LUMBAR ROM:   Active  A/PROM  eval  Flexion WFL  Extension WFL  Right lateral flexion Min   Left lateral flexion min  Right rotation min  Left rotation mod   (Blank rows = not tested)     LUMBAR SPECIAL TESTS:    FUNCTIONAL TESTS:    GAIT: Distance walked: 25 ft Assistive device utilized: None Level of assistance: Complete Independence Comments: Min decreased arm swing, slight L hip hike  TODAY'S TREATMENT:  DATE:    12/17/2023 Therapeutic Exercise: Aerobic: Supine:  SA reaches x 10, overhead reach x 3;  pelvic tilts x 10;   review for TA contraction x 5;    Bridging 2 x 8 ;   SLR with TA x 10 bil;  S/L:  Seated:    Prone:  Standing:  hip abd 2 x 10 bil;  Rows GTB 2 x 10 cueing for form;  Stretches:  cat/cow x 15;  Neuromuscular Re-education:  Manual Therapy: Lumbar & thoracic PA s;  STM tennis ball to R thoracic and lumbar paraspinals.  Self Care:  End of session:  BP: 119/81  Pulse: 89-94 O2: 95- 98   Previous Therapeutic Exercise: Aerobic: Supine:  Seated: 3 way medicine ball rollouts x10 Standing: Stretches: LTR 20x5"; Piriformis stretch bil 2x30" Neuromuscular Re-education: Manual Therapy: Therapeutic Activity: Self Care:    PATIENT EDUCATION:  Education details: updated and reviewed HEP Person educated: Patient Education method: Explanation, Demonstration, Tactile cues, Verbal cues, and Handouts Education comprehension: verbalized understanding, returned demonstration, verbal cues required, tactile cues required, and needs further education    HOME EXERCISE  PROGRAM: Access Code: [email or web address]   ASSESSMENT:  CLINICAL IMPRESSION:  Pt with good tolerance for ther ex today. Trial for standing hip strengthening at counter towards end of session. Pt requested to sit down after completing due to "calfs going numb for a few seconds" Pt states that she thinks this happens when her Oxygen  gets low that she feels numbness in her lower legs. Pt able to sit and rest, O2 readings 96-98, and symptoms subsided.  After 2-3 min rest, pt was standing at mat table, doing row exercise and stated slight light headedness. Again she sat to rest, and O2 readings normal. She reports sometimes getting "side effects " from her medication that she took this am. Other vitals checked, BP stable (see above. ) Pt asymptomatic after resting for another 2-3 minutes and upon leaving the clinic. States she feels comfortable to drive. Discussed continuing to check O2 and BP later today and seeking medical attension if needed. Pt states understanding.   Eval: Patient is a 82 y.o.f who was seen today for physical therapy evaluation and treatment for mid thoracic and midline/ Left sided low back pain. Her primary impairments include mobility deficits of the thoracic and lumbar spine, muscle performance deficits of the lumbopelvic musculature, balance deficts, and pain. These deficits limit her ability to hike, sleep, do water aerobics, sit for prolonged periods, navigate stairs, and line dance without pain/limitation. Pt noticed relief when laying prone and with thoracic and lumbar PAs but had pain completing prone press ups. Pt would benefit from skilled care to address above impairments.   OBJECTIVE IMPAIRMENTS: decreased balance, decreased coordination, decreased endurance, decreased mobility, difficulty walking, decreased ROM, decreased strength, hypomobility, impaired flexibility, and pain.   ACTIVITY LIMITATIONS: carrying, lifting, bending, sitting, standing, squatting, sleeping, stairs, and  locomotion level  PARTICIPATION LIMITATIONS: cleaning, laundry, community activity, and yard work  PERSONAL FACTORS: Past/current experiences, Time since onset of injury/illness/exacerbation, and 3+ comorbidities: HTN, chronic respirator failure with hypoxia, arthritis, & high cholesterol are also affecting patient's functional outcome.   REHAB POTENTIAL: Good  CLINICAL DECISION MAKING: Stable/uncomplicated  EVALUATION COMPLEXITY: Low   GOALS:  Goals reviewed with patient? Yes  SHORT TERM GOALS: Target date: 12/12/2023  Pt will be independent in initial HEP Goal status: INITIAL  2.  Patient will report at least 50% improvement in overall symptoms and/or function to demonstrate improved functional  mobility Baseline: 0% Goal status: INITIAL  3.  Pt will report ability to sit for at least 10 minutes with less pain 4/10 pain Baseline: 8/10 pain Goal status: INITIAL   LONG TERM GOALS: Target date: 01/16/2024  Pt will be independent in final HEP. Baseline:  Goal status: INITIAL  2.  Pt will improve FOTO outcome measure to at least projected score to demonstrate improved function Baseline:  Goal status: INITIAL  3.  Pt will report 75% improvement in her ability to line dance without pain/limitation. Baseline: 0% Goal status: INITIAL  4.  Pt will report 75% improvement in her ability to hike without pain/limitation. Baseline: 0% Goal status: INITIAL   PLAN:  PT FREQUENCY: 1-2x/week  PT DURATION: 8 weeks  PLANNED INTERVENTIONS: 97110-Therapeutic exercises, 97530- Therapeutic activity, 97112- Neuromuscular re-education, 97535- Self Care, 28413- Manual therapy, [phone]- Aquatic Therapy, [phone]- Ionotophoresis 4mg /ml Dexamethasone , Patient/Family education, Balance training, Stair training, Taping, Dry Needling, Joint mobilization, Joint manipulation, Spinal manipulation, Spinal mobilization, Vestibular training, Cryotherapy, and Moist heat.  PLAN FOR NEXT SESSION: STM/ PA to  lumbar spine, lumbar mobility interventions. Pt may benefit from squatting and lifting mechanics review  Bend, lift, squat, sit to stands with TA.  F/U foto   Terrilee Few, PT, DPT 5:09 PM  12/17/23  PHYSICAL THERAPY DISCHARGE SUMMARY  Visits from Start of Care: 5   Plan: Patient agrees to discharge.  Patient goals were not met. Patient is being discharged due to - not returning since last visit.      Terrilee Few, PT, DPT 10:40 AM  05/19/24

## 2023-12-17 NOTE — Telephone Encounter (Signed)
Copied from CRM [id number]. Topic: Clinical - Prescription Issue >> Dec 17, 2023 11:25 AM Theodis Sato wrote: Reason for CRM: PT states her [facility] pharmacy faxed a renewal request for trimethoprim (TRIMPEX) 100 MG tablet- PT is requesting that this be done soon so that the medication can be sent out today.  No fax yet received for refill of this medication, we just received Rx refill request in Epic and per [name] ok to refill for patient. Sent via mail order pharmacy as requested by patient.

## 2023-12-26 ENCOUNTER — Encounter: Payer: Medicare Other | Admitting: Physical Therapy

## 2023-12-31 ENCOUNTER — Encounter: Payer: Medicare HMO | Admitting: Physical Therapy

## 2024-01-10 ENCOUNTER — Other Ambulatory Visit: Payer: Self-pay | Admitting: Pharmacist

## 2024-01-10 DIAGNOSIS — J8410 Pulmonary fibrosis, unspecified: Secondary | ICD-10-CM

## 2024-01-10 MED ORDER — PIRFENIDONE 267 MG PO CAPS: ORAL_CAPSULE | ORAL | 0 refills | Status: DC

## 2024-02-06 ENCOUNTER — Ambulatory Visit: Payer: Self-pay | Admitting: Pulmonary Disease

## 2024-02-06 ENCOUNTER — Ambulatory Visit: Payer: Self-pay | Admitting: Physician Assistant

## 2024-02-06 DIAGNOSIS — R059 Cough, unspecified: Secondary | ICD-10-CM | POA: Diagnosis not present

## 2024-02-06 DIAGNOSIS — J209 Acute bronchitis, unspecified: Secondary | ICD-10-CM | POA: Diagnosis not present

## 2024-02-06 DIAGNOSIS — Z20822 Contact with and (suspected) exposure to covid-19: Secondary | ICD-10-CM | POA: Diagnosis not present

## 2024-02-06 NOTE — Telephone Encounter (Signed)
Copied from CRM [id number]. Topic: Clinical - Red Word Triage >> Feb 06, 2024 10:08 AM Drema Balzarine wrote: Red Word that prompted transfer to Nurse Triage: Patient says her chest is hurting/burning, she has a cough, flem, and runny nose   Chief Complaint: Cough Symptoms: Cough, runny nose, chest pain, mild to moderate shortness of breath  Frequency: Frequent cough  Pertinent Negatives: Patient denies fever Disposition: [x] ED /[] Urgent Care (no appt availability in office) / [] Appointment(In office/virtual)/ []  [facility]/ [] Home Care/ [] Refused Recommended Disposition /[]  Mobile Bus/ []  Follow-up with PCP Additional Notes: Patient reports a history of pulmonary fibrosis and states that for the last 3 days her cough has worsened. She states that with her cough she is having mild to moderate shortness of breath and a runny nose. She states that today she also developed a burning sensation in her chest. She states the burning is constant and is worse with cough. Patient advised to go to the ED for evaluation and treatment of her symptoms. She has asked if she could go to urgent care. I advised that urgent care could evaluate her but may tell her to go to the ED as well. Patient verbalized understanding of this.     Reason for Disposition  Chest pain  (Exception: MILD central chest pain, present only when coughing.)  Answer Assessment - Initial Assessment Questions 1. ONSET: "When did the cough begin?"      3 days ago  2. SEVERITY: "How bad is the cough today?"      4/10 3. SPUTUM: "Describe the color of your sputum" (none, dry cough; clear, white, yellow, green)     White 4. HEMOPTYSIS: "Are you coughing up any blood?" If so ask: "How much?" (flecks, streaks, tablespoons, etc.)     No 5. DIFFICULTY BREATHING: "Are you having difficulty breathing?" If Yes, ask: "How bad is it?" (e.g., mild, moderate, severe)    - MILD: No SOB at rest, mild SOB with walking, speaks normally in  sentences, can lie down, no retractions, pulse < 100.    - MODERATE: SOB at rest, SOB with minimal exertion and prefers to sit, cannot lie down flat, speaks in phrases, mild retractions, audible wheezing, pulse 100-120.    - SEVERE: Very SOB at rest, speaks in single words, struggling to breathe, sitting hunched forward, retractions, pulse > 120      Mild to moderate  6. FEVER: "Do you have a fever?" If Yes, ask: "What is your temperature, how was it measured, and when did it start?"     No 7. CARDIAC HISTORY: "Do you have any history of heart disease?" (e.g., heart attack, congestive heart failure)      No 8. LUNG HISTORY: "Do you have any history of lung disease?"  (e.g., pulmonary embolus, asthma, emphysema)     Pulmonary fibrosis, asthma   9. PE RISK FACTORS: "Do you have a history of blood clots?" (or: recent major surgery, recent prolonged travel, bedridden)     Unsure 10. OTHER SYMPTOMS: "Do you have any other symptoms?" (e.g., runny nose, wheezing, chest pain)       Runny nose, burning in chest  11. PREGNANCY: "Is there any chance you are pregnant?" "When was your last menstrual period?"       No 12. TRAVEL: "Have you traveled out of the country in the last month?" (e.g., travel history, exposures)       No  Protocols used: Cough - Acute Productive-A-AH

## 2024-02-06 NOTE — Telephone Encounter (Signed)
Please see triage note; pt advised to ED

## 2024-02-06 NOTE — Telephone Encounter (Signed)
Chief Complaint: cough Symptoms: burning in chest, productive cough, SOB Frequency: a few days Pertinent Negatives: Patient denies fever, CP Disposition: [] ED /[x] Urgent Care (no appt availability in office) / [] Appointment(In office/virtual)/ []  [facility]/ [] Home Care/ [] Refused Recommended Disposition /[] [place] [facility]/ []  Follow-up with PCP Additional Notes: Patient c/o of burning chest d/t productive cough x a few days. Denies any fever, CP. Does not take any respiratory tx but is on O2 at baseline. Endorses mild SOB. Triager attempted to schedule, but d/t inclement weather, pt preferred to go to UC. Triager then attempted to schedule at [facility], but timing was not convenient. Pt will seek other UC to get seen quicker. Patient verbalized understanding and to call back/911 with worsening symptoms.    Reason for Disposition  [1] Known COPD or other severe lung disease (i.e., bronchiectasis, cystic fibrosis, lung surgery) AND [2] worsening symptoms (i.e., increased sputum purulence or amount, increased breathing difficulty  Answer Assessment - Initial Assessment Questions E2C2 Pulmonary Triage "Chief Complaint (e.g., cough, sob, wheezing, fever, chills, sweat or additional symptoms) *Go to specific symptom protocol after initial questions. Productive cough - burning chest, congestion "How long have symptoms been present?" yesterday Have you tested for COVID or Flu? Note: If not, ask patient if a home test can be taken. If so, instruct patient to call back for positive results. No, I don't know if I have any test  MEDICINES:   "Have you used any OTC meds to help with symptoms?" Yes If yes, ask "What medications?" Just tylenol for pain Saline nasal flushes, garggles "Have you used your inhalers/maintenance medication?" No If yes, "What medications?" N/a If inhaler, ask "How many puffs and how often?" Note: Review instructions on medication in the  chart. N/a  OXYGEN: "Do you wear supplemental oxygen?" Yes If yes, "How many liters are you supposed to use?" 3L during day, 4L at night "Do you monitor your oxygen levels?" Yes If yes, "What is your reading (oxygen level) today?" 90-92% "What is your usual oxygen saturation reading?"  (Note: Pulmonary O2 sats should be 90% or greater) Typically in low 90's   1. ONSET: "When did the cough begin?"      yesterday 2. SEVERITY: "How bad is the cough today?"      Worse today 3. SPUTUM: "Describe the color of your sputum" (none, dry cough; clear, white, yellow, green)     White/creamy 4. HEMOPTYSIS: "Are you coughing up any blood?" If so ask: "How much?" (flecks, streaks, tablespoons, etc.)     no 5. DIFFICULTY BREATHING: "Are you having difficulty breathing?" If Yes, ask: "How bad is it?" (e.g., mild, moderate, severe)    - MILD: No SOB at rest, mild SOB with walking, speaks normally in sentences, can lie down, no retractions, pulse < 100.    - MODERATE: SOB at rest, SOB with minimal exertion and prefers to sit, cannot lie down flat, speaks in phrases, mild retractions, audible wheezing, pulse 100-120.    - SEVERE: Very SOB at rest, speaks in single words, struggling to breathe, sitting hunched forward, retractions, pulse > 120      Increased SOB compared to baseline 6. FEVER: "Do you have a fever?" If Yes, ask: "What is your temperature, how was it measured, and when did it start?"     no 7. CARDIAC HISTORY: "Do you have any history of heart disease?" (e.g., heart attack, congestive heart failure)      no 8. LUNG HISTORY: "Do you have  any history of lung disease?"  (e.g., pulmonary embolus, asthma, emphysema)     Pulmonary fibrosis OSA 9. PE RISK FACTORS: "Do you have a history of blood clots?" (or: recent major surgery, recent prolonged travel, bedridden)     Maybe, reports DVT 10. OTHER SYMPTOMS: "Do you have any other symptoms?" (e.g., runny nose, wheezing, chest pain)        Congestion, worsened wheezing as compared to baseline  Protocols used: Cough - Acute Productive-A-AH

## 2024-02-06 NOTE — Telephone Encounter (Signed)
 Noted and agreed, thank you.

## 2024-02-08 NOTE — Telephone Encounter (Signed)
Patient did go to UC Given Z-pak, prednisone, Albuterol inhaler, and tessalon pearls. She is feeling somewhat better.Advised to call back if she would like an appt.NFN

## 2024-02-20 DIAGNOSIS — J209 Acute bronchitis, unspecified: Secondary | ICD-10-CM | POA: Diagnosis not present

## 2024-02-29 DIAGNOSIS — J849 Interstitial pulmonary disease, unspecified: Secondary | ICD-10-CM | POA: Diagnosis not present

## 2024-02-29 DIAGNOSIS — G4733 Obstructive sleep apnea (adult) (pediatric): Secondary | ICD-10-CM | POA: Diagnosis not present

## 2024-03-05 DIAGNOSIS — H35373 Puckering of macula, bilateral: Secondary | ICD-10-CM | POA: Diagnosis not present

## 2024-03-05 DIAGNOSIS — Z961 Presence of intraocular lens: Secondary | ICD-10-CM | POA: Diagnosis not present

## 2024-03-05 DIAGNOSIS — H02834 Dermatochalasis of left upper eyelid: Secondary | ICD-10-CM | POA: Diagnosis not present

## 2024-03-19 ENCOUNTER — Other Ambulatory Visit: Payer: Self-pay | Admitting: Pulmonary Disease

## 2024-03-19 ENCOUNTER — Telehealth: Payer: Self-pay

## 2024-03-19 DIAGNOSIS — J8410 Pulmonary fibrosis, unspecified: Secondary | ICD-10-CM

## 2024-03-19 NOTE — Telephone Encounter (Signed)
 Copied from CRM [id number]. Topic: Clinical - Medication Question >> Mar 19, 2024  2:11 PM Isabell A wrote: Reason for CRM: Patient has been calling since 3/27 in regard to prescription Pirfenidone (ESBRIET) 267 MG CAPS - the original message may have been routed to the wrong department. When trying to help the patient put in the refill request & requesting the pharmacy - she ask to speak with a nurse or her doctor in the office stating this should already be on file.  Patient is completely out of this medication & requesting to speak with someone in the office.  ATC X1. LMTCB

## 2024-03-19 NOTE — Telephone Encounter (Signed)
 Copied from CRM [id number]. Topic: Clinical - Medication Refill >> Mar 19, 2024  2:07 PM Maria [name] wrote: Most Recent Primary Care Visit:  Provider: Bary Leriche  Department: [facility]  Visit Type: OFFICE VISIT  Date: 10/31/2023  Medication: Pirfenidone (ESBRIET) 267 MG CAPS  Has the patient contacted their pharmacy? Yes (Agent: If no, request that the patient contact the pharmacy for the refill. If patient does not wish to contact the pharmacy document the reason why and proceed with request.) (Agent: If yes, when and what did the pharmacy advise?)  Is this the correct pharmacy for this prescription? Yes If no, delete pharmacy and type the correct one.  This is the patient's preferred pharmacy:   [name] - [phone].  Has the prescription been filled recently? Yes  Is the patient out of the medication? Yes  Has the patient been seen for an appointment in the last year OR does the patient have an upcoming appointment? Yes  Can we respond through MyChart? Yes  Agent: Please be advised that Rx refills may take up to 3 business days. We ask that you follow-up with your pharmacy.

## 2024-03-20 NOTE — Telephone Encounter (Signed)
 Pt needs Esbriet sent in to [facility]. Pt states we need to call the pharmacy and not fax it in. Pt needs 3 months worth. Pt verbalized her disappointment that this was not sent in yet and she needs her medication. I informed pt that we could call this in today. I went over the pharmacy, RX and directions to make sure this was correct by the pt. Pt verbalized understanding.   Dr Isaiah Serge, Okay to call this in verbally?   Donella Stade number is [phone]

## 2024-03-21 MED ORDER — PIRFENIDONE 267 MG PO CAPS: ORAL_CAPSULE | ORAL | 0 refills | Status: DC

## 2024-03-21 NOTE — Telephone Encounter (Signed)
 A 3 month supply of Esbriet was sent to [facility] on 01/10/2024. Last 90-day supply was dispensed on 01/14/2024 based on rep at pharmacy. Please be advised that patient is NOT out of medication.  She is overdue for labs to be updated  I have sent this prescription electronically to [facility]. Patient can follow-up with pharmacy  Chesley Mires, PharmD, MPH, BCPS, CPP Clinical Pharmacist (Rheumatology and Pulmonology)

## 2024-03-21 NOTE — Telephone Encounter (Signed)
 Okay to send in the prescription for Esbriet Adding the pharmacy team so that they can help coordinate

## 2024-03-21 NOTE — Addendum Note (Signed)
 Addended by: Murrell Redden on: 03/21/2024 12:23 PM   Modules accepted: Orders

## 2024-03-24 NOTE — Telephone Encounter (Signed)
 Spoke to patient. She is aware that Rx for Esbriet was sent to [facility] on 03/21/2024/ She will keep scheduled appt with Dr. Isaiah Serge for 04/09/2024 and will plan to have labs drawn that day.  Nothing further needed.

## 2024-03-27 DIAGNOSIS — H26491 Other secondary cataract, right eye: Secondary | ICD-10-CM | POA: Diagnosis not present

## 2024-03-27 DIAGNOSIS — H18413 Arcus senilis, bilateral: Secondary | ICD-10-CM | POA: Diagnosis not present

## 2024-03-27 DIAGNOSIS — Z961 Presence of intraocular lens: Secondary | ICD-10-CM | POA: Diagnosis not present

## 2024-03-27 DIAGNOSIS — H35373 Puckering of macula, bilateral: Secondary | ICD-10-CM | POA: Diagnosis not present

## 2024-03-31 DIAGNOSIS — J849 Interstitial pulmonary disease, unspecified: Secondary | ICD-10-CM | POA: Diagnosis not present

## 2024-03-31 DIAGNOSIS — G4733 Obstructive sleep apnea (adult) (pediatric): Secondary | ICD-10-CM | POA: Diagnosis not present

## 2024-04-02 DIAGNOSIS — H26491 Other secondary cataract, right eye: Secondary | ICD-10-CM | POA: Diagnosis not present

## 2024-04-08 ENCOUNTER — Ambulatory Visit: Admitting: Pulmonary Disease

## 2024-04-08 DIAGNOSIS — J84112 Idiopathic pulmonary fibrosis: Secondary | ICD-10-CM | POA: Diagnosis not present

## 2024-04-08 LAB — PULMONARY FUNCTION TEST
DL/VA % pred: 123 %
DL/VA: 5.1 ml/min/mmHg/L
DLCO unc % pred: 74 %
DLCO unc: 12.99 ml/min/mmHg
FEF 25-75 Post: 1.27 L/s
FEF 25-75 Pre: 1.55 L/s
FEF2575-%Change-Post: -17 %
FEF2575-%Pred-Post: 111 %
FEF2575-%Pred-Pre: 135 %
FEV1-%Change-Post: 0 %
FEV1-%Pred-Post: 76 %
FEV1-%Pred-Pre: 76 %
FEV1-Post: 1.26 L
FEV1-Pre: 1.26 L
FEV1FVC-%Change-Post: 0 %
FEV1FVC-%Pred-Pre: 115 %
FEV6-%Change-Post: 0 %
FEV6-%Pred-Post: 70 %
FEV6-%Pred-Pre: 70 %
FEV6-Post: 1.48 L
FEV6-Pre: 1.49 L
FEV6FVC-%Pred-Post: 106 %
FEV6FVC-%Pred-Pre: 106 %
FVC-%Change-Post: 0 %
FVC-%Pred-Post: 66 %
FVC-%Pred-Pre: 66 %
FVC-Post: 1.48 L
FVC-Pre: 1.49 L
Post FEV1/FVC ratio: 85 %
Post FEV6/FVC ratio: 100 %
Pre FEV1/FVC ratio: 85 %
Pre FEV6/FVC Ratio: 100 %
RV % pred: 71 %
RV: 1.69 L
TLC % pred: 67 %
TLC: 3.21 L

## 2024-04-08 NOTE — Progress Notes (Signed)
 Full pft performed today.

## 2024-04-08 NOTE — Patient Instructions (Signed)
 Full pft performed today.

## 2024-04-09 ENCOUNTER — Ambulatory Visit: Admitting: Pulmonary Disease

## 2024-04-09 ENCOUNTER — Encounter: Payer: Self-pay | Admitting: Pulmonary Disease

## 2024-04-09 VITALS — BP 116/72 | HR 63 | Temp 98.3°F | Ht 62.0 in | Wt 157.0 lb

## 2024-04-09 DIAGNOSIS — I272 Pulmonary hypertension, unspecified: Secondary | ICD-10-CM | POA: Diagnosis not present

## 2024-04-09 DIAGNOSIS — Z7722 Contact with and (suspected) exposure to environmental tobacco smoke (acute) (chronic): Secondary | ICD-10-CM | POA: Diagnosis not present

## 2024-04-09 DIAGNOSIS — K219 Gastro-esophageal reflux disease without esophagitis: Secondary | ICD-10-CM

## 2024-04-09 DIAGNOSIS — R0602 Shortness of breath: Secondary | ICD-10-CM

## 2024-04-09 DIAGNOSIS — Z5181 Encounter for therapeutic drug level monitoring: Secondary | ICD-10-CM

## 2024-04-09 DIAGNOSIS — Z87891 Personal history of nicotine dependence: Secondary | ICD-10-CM

## 2024-04-09 DIAGNOSIS — J84112 Idiopathic pulmonary fibrosis: Secondary | ICD-10-CM

## 2024-04-09 DIAGNOSIS — G4733 Obstructive sleep apnea (adult) (pediatric): Secondary | ICD-10-CM | POA: Diagnosis not present

## 2024-04-09 DIAGNOSIS — J8410 Pulmonary fibrosis, unspecified: Secondary | ICD-10-CM | POA: Diagnosis not present

## 2024-04-09 LAB — COMPREHENSIVE METABOLIC PANEL WITH GFR
ALT: 11 U/L (ref 0–35)
AST: 18 U/L (ref 0–37)
Albumin: 4.3 g/dL (ref 3.5–5.2)
Alkaline Phosphatase: 81 U/L (ref 39–117)
BUN: 13 mg/dL (ref 6–23)
CO2: 30 meq/L (ref 19–32)
Calcium: 9.5 mg/dL (ref 8.4–10.5)
Chloride: 99 meq/L (ref 96–112)
Creatinine, Ser: 0.69 mg/dL (ref 0.40–1.20)
GFR: 80.26 mL/min (ref >60.00)
Glucose, Bld: 92 mg/dL (ref 70–99)
Potassium: 4.6 meq/L (ref 3.5–5.1)
Sodium: 133 meq/L — ABNORMAL LOW (ref 135–145)
Total Bilirubin: 0.5 mg/dL (ref 0.2–1.2)
Total Protein: 7 g/dL (ref 6.0–8.3)

## 2024-04-09 LAB — BRAIN NATRIURETIC PEPTIDE: Pro B Natriuretic peptide (BNP): 26 pg/mL (ref 0.0–100.0)

## 2024-04-09 NOTE — Progress Notes (Addendum)
 Maria [name]    [id number]    January 25, 1941  Primary Care Physician:Allwardt, Deleta Felix, PA-C  Referring Physician: Allwardt, Deleta Felix, PA-C [address] [place],  [place] [address]  Chief complaint:  Follow-up for IPF Started Esbriet  September 2021  HPI: 83 y.o. with history of hypertension, stroke, sleep apnea. Referred for evaluation of interstitial lung disease This was noted on CT scan done in 2018.  Notes increasing cough, dyspnea over the past few months.  Symptoms worsen after COVID-19 vaccination.  No mucus production, wheezing or chills.  She had an exposure to DDT as a child ( middle School) and had severe respiratory problems ever since, including multiple flu illnesses that would take her 6-8 weeks to recover from.  Finished pulmonary rehab in 2022 [place] [facility] for second opinion in 2022 She developed COVID-19 in January 2023 and was treated with molnupiravir .  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  No mold, hot tub, Jacuzzi.  No asbestos, down pillows or comforters Smoking history: 5-pack-year smoker.  Quit in 1971 Travel history: No significant travel history Relevant family history: Grandfather had emphysema.  He was a pipe smoker  Interim history: Discussed the use of AI scribe software for clinical note transcription with the patient, who gave verbal consent to proceed.  History of Present Illness Maria [name] is an 83 year old female with idiopathic pulmonary fibrosis who presents for a six-month follow-up visit.  She experienced a mild case of pneumonia in early March. She describes having good and bad days, with reduced activity on the less favorable days compared to before. Recent lung function tests indicate a slight decrease in lung capacity and diffusion capacity. She acknowledges  being less active than before, attributing this to her condition.  She has been taking esbriet  since 2021 as part of a grant application, which allows her to continue for another year. She is concerned about the continuation of this medication due to potential insurance changes.  She experiences numbness in her legs, described as a sensation of 'ice water or dry ice' in her veins, particularly at 3 AM. This numbness has become more painful and occurs at other times, affecting her ability to walk long distances. She uses an infrared light on her foot to manage symptoms. No tingling sensation in legs, but reports numbness and pain, especially at night and during physical activity.  Despite these challenges, she remains active, participating in activities such as bowling, water aerobics, and line dancing, although with less energy and for shorter durations. She mentions gaining weight, which she acknowledges may be affecting her breathing.    Outpatient Encounter Medications as of 04/09/2024  Medication Sig   amLODipine  (NORVASC ) 10 MG tablet Take 1 tablet (10 mg total) by mouth daily.   CALCIUM PO Take by mouth daily.   Cholecalciferol (VITAMIN D-3 PO) Take by mouth daily.   clopidogrel  (PLAVIX ) 75 MG tablet TAKE 1 TABLET EVERY DAY   co-enzyme Q-10 30 MG capsule Take 30 mg by mouth 3 (three) times daily.   Collagen Hydrolysate POWD 1 Scoop by Does not apply route daily.   Cyanocobalamin  (VITAMIN B 12 PO) Take by mouth.   ferrous sulfate 324 MG TBEC Take 324 mg by mouth every other day.   hydrochlorothiazide  (MICROZIDE ) 12.5 MG capsule TAKE 1  CAPSULE EVERY DAY   levocetirizine (XYZAL ) 5 MG tablet TAKE 1 TABLET EVERY EVENING   LUTEIN PO Take by mouth daily.   montelukast  (SINGULAIR ) 10 MG tablet TAKE 1 TABLET AT BEDTIME   Multiple Vitamin (VITAMIN E/FOLIC ACID/B-6/B-12 PO) Take by mouth. Vit  b 6   Multiple Vitamins-Minerals (PRESERVISION AREDS 2 PO) Take by mouth.   oxybutynin  (DITROPAN -XL) 10  MG 24 hr tablet TAKE 1 TABLET EVERY DAY   pantoprazole  (PROTONIX ) 40 MG tablet TAKE 1 TABLET TWICE DAILY   Pirfenidone  (ESBRIET ) 267 MG CAPS TAKE 3 CAPSULES BY MOUTH WITH BREAKFAST, WITH LUNCH, AND WITH EVENING MEAL.   potassium chloride  (KLOR-CON  M) 10 MEQ tablet TAKE 1 TABLET EVERY DAY   simvastatin  (ZOCOR ) 10 MG tablet TAKE 1 TABLET EVERY DAY AT 6 PM   trimethoprim  (TRIMPEX ) 100 MG tablet TAKE 1 TABLET EVERY DAY   No facility-administered encounter medications on file as of 04/09/2024.   Physical Exam: Blood pressure 122/60, pulse 71, height 5\' 2"  (1.575 m), weight 150 lb (68 kg), SpO2 94%. Gen:      No acute distress HEENT:  EOMI, sclera anicteric Neck:     No masses; no thyromegaly Lungs:    Clear to auscultation bilaterally; normal respiratory effort CV:         Regular rate and rhythm; no murmurs Abd:      + bowel sounds; soft, non-tender; no palpable masses, no distension Ext:    No edema; adequate peripheral perfusion Skin:      Warm and dry; no rash Neuro: alert and oriented x 3 Psych: normal mood and affect   Data Reviewed: Imaging: High-resolution CT 03/29/2017-basal gradient fibrotic changes with reticulation, traction bronchiectasis.  Moderate air trapping.  Probable UIP pattern.  High-res CT 04/15/2020-mildly progressive pulmonary fibrosis probable UIP pattern.  High-res CT 04/20/2022-stable pattern of fibrosis in probable UIP pattern.  High-res CT 04/20/2022-stable pattern of fibrosis in probable UIP pattern.  High resolution CT 05/16/2023-stable pattern of pulmonary fibrosis in probable UIP pattern I have reviewed the images personally.  PFTs: 04/17/2017 FVC 1.69 [64%], FEV1 1.45 [73%], F/F 86, TLC 3.72 [76%], DLCO 14.90 [65%]  06/17/2020 FVC 1.72 [68%], FEV1 1.44 [76%], F/F 84, TLC 3.26 [66%], DLCO 14.53 [79%]   07/05/2022 FVC 1.69 [72%], FEV1 1.45 [84%], F/F86, TLC 3.31 [69%], DLCO 13.38 [76%] Mild restriction, minimal diffusion defect  04/08/2024 FVC 1.48 (6%),  FEV1 1.26 [76%], F/F85, TLC 3.21 [69%], DLCO 12.99 [74%] Mild restriction, mild diffusion defect  6-minute walk test 06/30/2020- 443 m, nadir O2 sat of 90%  Labs: RAST panel 04/27/2015-IgE 40, sensitive to grass, tree pollen CBC 02/18/2018-WBC 6.2, eos 3.9%, absolute eosinophil count 242  N-terminal proBNP 09/10/2019 1-24  ILD serologies 04/09/2020-atypical p-ANCA 1:80  Sleep: PSG 07/31/17, AHI 6.4, SpO2 low 85%. CPAP titration 11/15/2017 CPAP 7  Cardiac: Echocardiogram 09/02/2021-LVEF 60 to 65%, RV systolic function is normal.  Normal PA systolic pressure.  No evidence of shunt by bubble study  Echocardiogram 03/02/2023-LVEF 60 to 65%, RV systolic function is normal, no pulmonary hypertension.  Assessment:  Pulmonary fibrosis Probable UIP pattern.  This is likely IPF with minimal exposures.  Serologies show mild elevation in ANCA which is likely nonspecific with no other signs and symptoms of autoimmune process.  Discussed findings in detail.  To make a diagnosis with certainty we would need a lung biopsy but not recommended given her age. Given mild progression on CT scan and PFTs with worsening symptoms we have decided to treat with antifibrotic  Continue Esbriet  Check LFTs  Mild pulmonary hypertension Noted on echocardiogram in 2021 however repeat study did not show any evidence of pulmonary hypertension.  Continue monitoring.  Chronic cough Secondary to pulmonary fibrosis, GERD Continue PPI, Tessalon  Advised to raise head of the bed and not lay down at least 2 hours after eating Start flutter valve and Mucinex for chest congestion  OSA Stable Download reviewed with good compliance  Plan/Recommendations: - Continue Esbriet , check LFTs, proBNP - Follow-up PFTs - Mucinex, flutter valve for chest congestion, cough  Phyllis Breeze MD [facility] 04/09/2024, 11:31 AM  CC: Allwardt, Deleta Felix, PA-C

## 2024-04-09 NOTE — Patient Instructions (Signed)
 VISIT SUMMARY:  During your visit, we discussed your idiopathic pulmonary fibrosis and peripheral neuropathy. We reviewed your recent lung function tests and discussed your symptoms, including numbness and pain in your legs. We also talked about your current medications and activity levels.  YOUR PLAN:  -IDIOPATHIC PULMONARY FIBROSIS: Idiopathic pulmonary fibrosis is a lung disease that causes scarring of the lungs, leading to a decrease in lung function over time. Your recent lung function tests show a slight decrease in lung capacity and diffusion capacity, which is consistent with the progressive nature of the disease. We will continue your current medication regimen, order a CT scan in six months, and perform liver function tests today. It's important to maintain an active lifestyle and a healthy diet. We will reassess your grant application for medication continuation closer to its expiration.  -PERIPHERAL NEUROPATHY: Peripheral neuropathy is a condition that results from damage to the nerves outside of the brain and spinal cord, causing numbness and pain, particularly in the legs. Your symptoms are more pronounced at night and during physical activity. We will monitor your symptoms and adjust your activity levels as needed.  INSTRUCTIONS:  Please schedule a CT scan in six months and have your liver function tests done today. Continue with your current medications and try to stay active with a healthy diet. We will reassess your grant application for medication continuation closer to its expiration.

## 2024-04-28 ENCOUNTER — Ambulatory Visit (INDEPENDENT_AMBULATORY_CARE_PROVIDER_SITE_OTHER): Payer: Medicare HMO

## 2024-04-28 VITALS — BP 130/68 | HR 68 | Temp 98.2°F | Wt 153.0 lb

## 2024-04-28 DIAGNOSIS — Z0000 Encounter for general adult medical examination without abnormal findings: Secondary | ICD-10-CM | POA: Diagnosis not present

## 2024-04-28 NOTE — Patient Instructions (Signed)
 Ms. Glaus , Thank you for taking time out of your busy schedule to complete your Annual Wellness Visit with me. I enjoyed our conversation and look forward to speaking with you again next year. I, as well as your care team,  appreciate your ongoing commitment to your health goals. Please review the following plan we discussed and let me know if I can assist you in the future. Your Game plan/ To Do List    Referrals: If you haven't heard from the office you've been referred to, please reach out to them at the phone provided.   Follow up Visits: Next Medicare AWV with our clinical staff: yes   Have you seen your provider in the last 6 months (3 months if uncontrolled diabetes)? Yes Next Office Visit with your provider: 10/31/23 next appt 04/30/24  Clinician Recommendations:  Aim for 30 minutes of exercise or brisk walking, 6-8 glasses of water, and 5 servings of fruits and vegetables each day. Yes       This is a list of the screening recommended for you and due dates:  Health Maintenance  Topic Date Due   Medicare Annual Wellness Visit  04/22/2024   Flu Shot  07/18/2024   DTaP/Tdap/Td vaccine (3 - Td or Tdap) 02/22/2033   Pneumonia Vaccine  Completed   DEXA scan (bone density measurement)  Completed   Zoster (Shingles) Vaccine  Completed   HPV Vaccine  Aged Out   Meningitis B Vaccine  Aged Out   Colon Cancer Screening  Discontinued   COVID-19 Vaccine  Discontinued    Advanced directives: (Copy Requested) Please bring a copy of your health care power of attorney and living will to the office to be added to your chart at your convenience. You can mail to [facility] [address] [place], [place] [address] or email to [email or web address] Advance Care Planning is important because it:  [x]  Makes sure you receive the medical care that is consistent with your values, goals, and preferences  [x]  It provides guidance to your family and loved ones and reduces their  decisional burden about whether or not they are making the right decisions based on your wishes.  Follow the link provided in your after visit summary or read over the paperwork we have mailed to you to help you started getting your Advance Directives in place. If you need assistance in completing these, please reach out to us  so that we can help you!  See attachments for Preventive Care and Fall Prevention Tips.

## 2024-04-28 NOTE — Progress Notes (Signed)
 Subjective:   Maria [name] is a 83 y.o. who presents for a Medicare Wellness preventive visit.  As a reminder, Annual Wellness Visits don't include a physical exam, and some assessments may be limited, especially if this visit is performed virtually. We may recommend an in-person visit if needed.  Visit Complete: In person    Persons Participating in Visit: Patient.  AWV Questionnaire: No: Patient Medicare AWV questionnaire was not completed prior to this visit.  Cardiac Risk Factors include: advanced age (>79men, >43 women);dyslipidemia;hypertension     Objective:     Today's Vitals   04/28/24 0900  BP: 130/68  Pulse: 68  Temp: 98.2 F (36.8 C)  SpO2: 93%  Weight: 153 lb (69.4 kg)  PainSc: 0-No pain   Body mass index is 27.98 kg/m.     04/28/2024    9:08 AM 11/21/2023    9:57 AM 04/23/2023    8:21 AM 02/23/2023    5:17 PM 04/17/2022    8:17 AM 08/25/2020   12:19 PM 08/08/2019   12:08 PM  Advanced Directives  Does Patient Have a Medical Advance Directive? Yes Yes Yes No Yes Yes Yes  Type of Estate agent of [place];Living will  Healthcare Power of [place];Living will  Healthcare Power of [employer] of [place];Living will  Does patient want to make changes to medical advance directive?      No - Patient declined   Copy of Healthcare Power of Attorney in Chart? No - copy requested  No - copy requested  No - copy requested  No - copy requested    Current Medications (verified) Outpatient Encounter Medications as of 04/28/2024  Medication Sig   amLODipine  (NORVASC ) 10 MG tablet Take 1 tablet (10 mg total) by mouth daily.   CALCIUM PO Take by mouth daily.   Cholecalciferol (VITAMIN D-3 PO) Take by mouth daily.   clopidogrel  (PLAVIX ) 75 MG tablet TAKE 1 TABLET EVERY DAY   co-enzyme Q-10 30 MG capsule Take 30 mg by mouth 3 (three) times daily.   Collagen Hydrolysate POWD 1 Scoop by Does not apply route daily.   Cyanocobalamin   (VITAMIN B 12 PO) Take by mouth.   ferrous sulfate 324 MG TBEC Take 324 mg by mouth every other day.   hydrochlorothiazide  (MICROZIDE ) 12.5 MG capsule TAKE 1 CAPSULE EVERY DAY   levocetirizine (XYZAL ) 5 MG tablet TAKE 1 TABLET EVERY EVENING   LUTEIN PO Take by mouth daily.   montelukast  (SINGULAIR ) 10 MG tablet TAKE 1 TABLET AT BEDTIME   Multiple Vitamin (VITAMIN E/FOLIC ACID/B-6/B-12 PO) Take by mouth. Vit  b 6   Multiple Vitamins-Minerals (PRESERVISION AREDS 2 PO) Take by mouth.   oxybutynin  (DITROPAN -XL) 10 MG 24 hr tablet TAKE 1 TABLET EVERY DAY   pantoprazole  (PROTONIX ) 40 MG tablet TAKE 1 TABLET TWICE DAILY   Pirfenidone  (ESBRIET ) 267 MG CAPS TAKE 3 CAPSULES BY MOUTH WITH BREAKFAST, WITH LUNCH, AND WITH EVENING MEAL.   potassium chloride  (KLOR-CON  M) 10 MEQ tablet TAKE 1 TABLET EVERY DAY   simvastatin  (ZOCOR ) 10 MG tablet TAKE 1 TABLET EVERY DAY AT 6 PM   trimethoprim  (TRIMPEX ) 100 MG tablet TAKE 1 TABLET EVERY DAY   No facility-administered encounter medications on file as of 04/28/2024.    Allergies (verified) Amoxicillin , Olmesartan, Ace inhibitors, Bee venom, Benicar [olmesartan medoxomil], Codeine, Covid-19 (mrna) vaccine, Lisinopril, Macrobid  [nitrofurantoin  monohyd macro], Nutritional supplements, Penicillins, and Shingrix [zoster vac recomb adjuvanted]   History: Past Medical History:  Diagnosis  Date   Anemia    Arthritis    Bronchitis    Carpal tunnel syndrome    in calves neuropathy   Diverticulosis    GERD (gastroesophageal reflux disease)    Headache(784.0)    chronic   High cholesterol    Hyperlipidemia    Hypertension    Migraine    Nephritis    OSA (obstructive sleep apnea)    Palpitations    Personal history of colonic adenomas 09/15/2010   Pyelonephritis, acute 09/23/2013   A. 10/14: Hospitalized [facility] for treatment     Stroke [facility])    TIA (transient ischemic attack)    Vertigo, benign paroxysmal    Past Surgical History:   Procedure Laterality Date   BLADDER REPAIR     BREAST BIOPSY     rt benign cyst   BREAST EXCISIONAL BIOPSY Right    CARPAL TUNNEL RELEASE     x 2   COLONOSCOPY     REPLACEMENT TOTAL KNEE BILATERAL     TONSILLECTOMY     UPPER GASTROINTESTINAL ENDOSCOPY     Family History  Problem Relation Age of Onset   Diabetes Father    Aneurysm Father    Kidney disease Father    Hypertension Father    Heart disease Mother    Colon polyps Mother    Chronic bronchitis Mother    Diabetes Brother    Colon cancer Maternal Grandmother    Irritable bowel syndrome Daughter    Emphysema Maternal Grandfather        smoked a pipe   Social History   Socioeconomic History   Marital status: Divorced    Spouse name: Not on file   Number of children: 3   Years of education: Not on file   Highest education level: Not on file  Occupational History   Occupation: retired  Tobacco Use   Smoking status: Former    Current packs/day: 0.00    Average packs/day: 1 pack/day for 10.0 years (10.0 ttl pk-yrs)    Types: Cigarettes    Start date: 12/19/1959    Quit date: 12/18/1969    Years since quitting: 54.3    Passive exposure: Past   Smokeless tobacco: Never  Vaping Use   Vaping status: Never Used  Substance and Sexual Activity   Alcohol use: Yes    Comment: ocassional use   Drug use: No   Sexual activity: Not Currently  Other Topics Concern   Not on file  Social History Narrative   Retired, divorced 3 kids   Former smoker   Occ EtOH   No drugs   Social Drivers of Corporate investment banker Strain: Low Risk  (04/28/2024)   Overall Financial Resource Strain (CARDIA)    Difficulty of Paying Living Expenses: Not hard at all  Food Insecurity: No Food Insecurity (04/28/2024)   Hunger Vital Sign    Worried About Running Out of Food in the Last Year: Never true    Ran Out of Food in the Last Year: Never true  Transportation Needs: No Transportation Needs (04/28/2024)   PRAPARE - Therapist, art (Medical): No    Lack of Transportation (Non-Medical): No  Physical Activity: Sufficiently Active (04/28/2024)   Exercise Vital Sign    Days of Exercise per Week: 4 days    Minutes of Exercise per Session: 60 min  Stress: Stress Concern Present (04/28/2024)   [employer] of Occupational Health - Occupational Stress Questionnaire  Feeling of Stress : Rather much  Social Connections: Moderately Integrated (04/28/2024)   Social Connection and Isolation Panel [NHANES]    Frequency of Communication with Friends and Family: More than three times a week    Frequency of Social Gatherings with Friends and Family: More than three times a week    Attends Religious Services: More than 4 times per year    Active Member of [employer] or Organizations: Yes    Attends Banker Meetings: 1 to 4 times per year    Marital Status: Divorced    Tobacco Counseling Counseling given: Not Answered    Clinical Intake:  Pre-visit preparation completed: Yes  Pain : No/denies pain Pain Score: 0-No pain     BMI - recorded: 27.98 Nutritional Status: BMI 25 -29 Overweight Nutritional Risks: None Diabetes: No  Lab Results  Component Value Date   HGBA1C 5.7 10/31/2023   HGBA1C 5.6 04/23/2023   HGBA1C 5.4 07/05/2022     How often do you need to have someone help you when you read instructions, pamphlets, or other written materials from your doctor or pharmacy?: 1 - Never  Interpreter Needed?: No  Information entered by :: Lamont Pilsner, LPN   Activities of Daily Living     04/28/2024    9:03 AM  In your present state of health, do you have any difficulty performing the following activities:  Hearing? 0  Vision? 0  Difficulty concentrating or making decisions? 0  Walking or climbing stairs? 1  Comment SOB  Dressing or bathing? 1  Comment at times  Doing errands, shopping? 0  Preparing Food and eating ? Y  Comment at times  Using the Toilet? Y  Comment  stress incontinence wears a pad  In the past six months, have you accidently leaked urine? N  Do you have problems with loss of bowel control? N  Managing your Medications? N  Managing your Finances? N  Housekeeping or managing your Housekeeping? N    Patient Care Team: Allwardt, [name] M, PA-C as PCP - General (Physician Assistant) Wes Hamman, MD (Orthopedic Surgery) Erman Hayward, MD as Consulting Physician (Urology)  Indicate any recent Medical Services you may have received from other than [facility] providers in the past year (date may be approximate).     Assessment:    This is a routine wellness examination for Emiley.  Hearing/Vision screen Hearing Screening - Comments:: Pt denies any hearing issues  Vision Screening - Comments:: Wears rx glasses - up to date with routine eye exams with [facility] Dr Johnetta Nab     Goals Addressed             This Visit's Progress    Patient Stated       Lose 10 lbs and improve diet        Depression Screen     04/28/2024    9:06 AM 10/31/2023    9:01 AM 04/23/2023    8:41 AM 04/23/2023    8:14 AM 02/27/2023    1:01 PM 04/17/2022    8:15 AM 04/21/2021   11:12 AM  PHQ 2/9 Scores  PHQ - 2 Score 0 1 0 0 1 0 0  PHQ- 9 Score  4  0 2  0    Fall Risk     04/28/2024    9:09 AM 04/23/2023    8:41 AM 04/23/2023    8:22 AM 02/27/2023   12:57 PM 04/17/2022    8:18 AM  Fall Risk  Falls in the past year? 0 0 1  0  Number falls in past yr: 0 0 1 0 0  Injury with Fall? 0 0 1 1 0  Comment   head and over eye    Risk for fall due to : Impaired mobility;Impaired balance/gait No Fall Risks Impaired vision;Impaired balance/gait History of fall(s) Impaired vision  Follow up Falls prevention discussed Falls evaluation completed Falls prevention discussed Falls evaluation completed Falls prevention discussed    MEDICARE RISK AT HOME:  Medicare Risk at Home Any stairs in or around the home?: No If so, are there any without handrails?: No Home  free of loose throw rugs in walkways, pet beds, electrical cords, etc?: Yes Adequate lighting in your home to reduce risk of falls?: Yes Life alert?: No Use of a cane, walker or w/c?: Yes (for hiking) Grab bars in the bathroom?: No Shower chair or bench in shower?: Yes Elevated toilet seat or a handicapped toilet?: No  TIMED UP AND GO:  Was the test performed?  Yes  Length of time to ambulate 10 feet: 10 sec Gait steady and fast without use of assistive device  Cognitive Function: 6CIT completed        04/28/2024    9:10 AM 04/23/2023    8:24 AM 04/17/2022    8:19 AM 08/08/2019   12:12 PM  6CIT Screen  What Year? 0 points 0 points 0 points 0 points  What month? 0 points 0 points 0 points 0 points  What time? 0 points 0 points 0 points 0 points  Count back from 20 0 points 0 points 0 points 0 points  Months in reverse 0 points 0 points 0 points 0 points  Repeat phrase 0 points 0 points 0 points 0 points  Total Score 0 points 0 points 0 points 0 points    Immunizations Immunization History  Administered Date(s) Administered   Hepatitis A, Adult 04/24/2005   Influenza Split 10/22/2012, 08/25/2013   Influenza Whole 08/18/2010   Influenza, High Dose Seasonal PF 12/16/2015, 12/01/2016, 10/30/2017, 10/29/2018   Influenza, Quadrivalent, Recombinant, Inj, Pf 08/23/2019   Influenza,inj,Quad PF,6+ Mos 01/05/2015   Influenza-Unspecified 10/18/2018   PFIZER(Purple Top)SARS-COV-2 Vaccination 02/13/2020, 03/09/2020   Pneumococcal Conjugate-13 10/23/2013   Pneumococcal Polysaccharide-23 08/18/2008   Td 11/10/2014   Tdap 02/23/2023   Typhoid Live 04/24/2005   Yellow Fever 04/24/2005   Zoster Recombinant(Shingrix) 09/08/2019, 12/18/2019    Screening Tests Health Maintenance  Topic Date Due   INFLUENZA VACCINE  07/18/2024   Medicare Annual Wellness (AWV)  04/28/2025   DTaP/Tdap/Td (3 - Td or Tdap) 02/22/2033   Pneumonia Vaccine 33+ Years old  Completed   DEXA SCAN  Completed    Zoster Vaccines- Shingrix  Completed   HPV VACCINES  Aged Out   Meningococcal B Vaccine  Aged Out   Colonoscopy  Discontinued   COVID-19 Vaccine  Discontinued    Health Maintenance  There are no preventive care reminders to display for this patient.  Health Maintenance Items Addressed: See Nurse Notes  Additional Screening:  Vision Screening: Recommended annual ophthalmology exams for early detection of glaucoma and other disorders of the eye.  Dental Screening: Recommended annual dental exams for proper oral hygiene  Community Resource Referral / Chronic Care Management: CRR required this visit?  No   CCM required this visit?  No   Plan:    I have personally reviewed and noted the following in the patient's chart:   Medical and social history Use  of alcohol, tobacco or illicit drugs  Current medications and supplements including opioid prescriptions. Patient is not currently taking opioid prescriptions. Functional ability and status Nutritional status Physical activity Advanced directives List of other physicians Hospitalizations, surgeries, and ER visits in previous 12 months Vitals Screenings to include cognitive, depression, and falls Referrals and appointments  In addition, I have reviewed and discussed with patient certain preventive protocols, quality metrics, and best practice recommendations. A written personalized care plan for preventive services as well as general preventive health recommendations were provided to patient.   Bruno Capri, LPN   1/61/0960   After Visit Summary: (In Person-Printed) AVS printed and given to the patient  Notes: Nothing significant to report at this time.

## 2024-04-30 ENCOUNTER — Ambulatory Visit (INDEPENDENT_AMBULATORY_CARE_PROVIDER_SITE_OTHER): Payer: Medicare HMO | Admitting: Physician Assistant

## 2024-04-30 VITALS — BP 124/64 | HR 69 | Temp 98.4°F | Ht 62.0 in | Wt 153.2 lb

## 2024-04-30 DIAGNOSIS — G629 Polyneuropathy, unspecified: Secondary | ICD-10-CM | POA: Diagnosis not present

## 2024-04-30 DIAGNOSIS — J849 Interstitial pulmonary disease, unspecified: Secondary | ICD-10-CM | POA: Diagnosis not present

## 2024-04-30 DIAGNOSIS — I10 Essential (primary) hypertension: Secondary | ICD-10-CM

## 2024-04-30 DIAGNOSIS — G459 Transient cerebral ischemic attack, unspecified: Secondary | ICD-10-CM | POA: Diagnosis not present

## 2024-04-30 DIAGNOSIS — J84112 Idiopathic pulmonary fibrosis: Secondary | ICD-10-CM | POA: Diagnosis not present

## 2024-04-30 DIAGNOSIS — Z87898 Personal history of other specified conditions: Secondary | ICD-10-CM | POA: Diagnosis not present

## 2024-04-30 DIAGNOSIS — G4733 Obstructive sleep apnea (adult) (pediatric): Secondary | ICD-10-CM | POA: Diagnosis not present

## 2024-04-30 DIAGNOSIS — N3281 Overactive bladder: Secondary | ICD-10-CM | POA: Diagnosis not present

## 2024-04-30 NOTE — Patient Instructions (Signed)
 Always great to see you!  You're doing great - keep up good work.  We'll plan on labs for next visit - ok to come fasting.  Let me know if you want referral to cardiology.  Try FOAM ROLLER. Massage, Epsom salt soaks at home. Consider PT here to help with neuropathic pain. Consider Lyrica or Gabapentin .

## 2024-04-30 NOTE — Progress Notes (Signed)
 Patient ID: Maria [name], female    DOB: [birth year], 83 y.o.   MRN: [id number]   Assessment & Plan:  Essential hypertension  IPF (idiopathic pulmonary fibrosis) (HCC)  Neuropathy  History of prediabetes  TIA (transient ischemic attack)  Overactive bladder    Assessment & Plan Idiopathic Pulmonary Fibrosis Chronic condition with progressive decline in lung function. Reports decreased energy and increased difficulty with oxygenation, requiring rest days. Last echocardiogram in 2024 showed stable cardiac function with no evidence of pulmonary hypertension. Prefers to wait for pulmonologist's input in October before consulting cardiology for occasional flutters and past irregular heartbeat. - Continue management with pulmonologist - Discuss potential cardiology follow-up in October  Hypertension Well-controlled on amlodipine  10 mg and hydrochlorothiazide  12.5 mg daily. Sodium levels slightly low, likely due to hydrochlorothiazide . - Continue current antihypertensive regimen - Monitor sodium levels  Neuropathic Pain Chronic neuropathic pain with numbness and burning sensation in legs, particularly the left leg, exacerbated by physical activity. No current medication due to concerns about side effects. Good peripheral pulses and blood flow. Discussed non-pharmacological options such as Epsom salt soaks and foam rollers. Consideration of gabapentin  if symptoms worsen, with potential side effects including sleepiness. - Consider Epsom salt soaks - Consider foam roller - Discuss potential gabapentin  use if symptoms worsen  Transient Ischemic Attack (TIA) Well-managed on Plavix  75 mg daily with no bleeding issues. - Continue Plavix  75 mg daily  Prediabetes A1c levels well-controlled, recent values under 6.0. No current concerns for diabetes. Discussed annual A1c monitoring. - Check A1c annually  Overactive Bladder Well-managed on oxybutynin  XL 10 mg daily with no issues. -  Continue oxybutynin  XL 10 mg daily  Follow-up Regular follow-up to monitor chronic conditions and adjust management. - Schedule follow-up in six months - Plan for labs at next follow-up      Return in about 6 months (around 10/31/2024) for recheck/follow-up.    Subjective:    Chief Complaint  Patient presents with   Medical Management of Chronic Issues    Pt in office 6 month follow up, pt wants to go over lab work and discuss going to a year fu. Pt has seen dr.mannum and stated they had seen a faster decline over the last year. Feels like she has less interest in food and feels she needs more oxygen . Its harder for her to do things but she is still trying to stay active. Wearing a back brace now and legs are going numb.     HPI Discussed the use of AI scribe software for clinical note transcription with the patient, who gave verbal consent to proceed.  History of Present Illness Maria [name] is an 83 year old female with idiopathic pulmonary fibrosis who presents for a regular six-month follow-up.  She feels less energetic and notes a decline in her overall condition, stating 'I'm not as energetic and I'm going downhill.' There has been a greater decline this year compared to the first three years of her diagnosis.  She experiences increased numbness in her legs and describes a long-standing pain that feels like 'dry ice,' which is now hot and burning. She has not been taking any medication for the numbness. She uses an infrared device for her toes when they go numb, which helps, especially in winter. No pain when walking, only numbness.  She reports experiencing heart flutters and occasional pain. Her last echocardiogram was in 2024. She has a history of irregular heartbeat but no pain or shortness of breath  directly related to the flutters.  She continues to have back pain and uses a brace for support, which provides some relief. She occasionally gets massages to help with  the discomfort.  Her blood pressure is well controlled with amlodipine  10 mg and hydrochlorothiazide  12.5 mg daily. She also takes Plavix  75 mg daily for her history of TIA and oxybutynin  XL 10 mg daily for overactive bladder.  She has a history of prediabetes, but her A1c levels have been stable, with the most recent being 5.7 six months ago. She is mindful of her lifestyle habits to manage this condition.  She mentions a family history of diabetes, with an aunt who was diagnosed in her eighties.     Past Medical History:  Diagnosis Date   Anemia    Arthritis    Bronchitis    Carpal tunnel syndrome    in calves neuropathy   Diverticulosis    GERD (gastroesophageal reflux disease)    Headache(784.0)    chronic   High cholesterol    Hyperlipidemia    Hypertension    Migraine    Nephritis    OSA (obstructive sleep apnea)    Palpitations    Personal history of colonic adenomas 09/15/2010   Pyelonephritis, acute 09/23/2013   A. 10/14: Hospitalized [facility] for treatment     Stroke [facility])    TIA (transient ischemic attack)    Vertigo, benign paroxysmal     Past Surgical History:  Procedure Laterality Date   BLADDER REPAIR     BREAST BIOPSY     rt benign cyst   BREAST EXCISIONAL BIOPSY Right    CARPAL TUNNEL RELEASE     x 2   COLONOSCOPY     REPLACEMENT TOTAL KNEE BILATERAL     TONSILLECTOMY     UPPER GASTROINTESTINAL ENDOSCOPY      Family History  Problem Relation Age of Onset   Diabetes Father    Aneurysm Father    Kidney disease Father    Hypertension Father    Heart disease Mother    Colon polyps Mother    Chronic bronchitis Mother    Diabetes Brother    Colon cancer Maternal Grandmother    Irritable bowel syndrome Daughter    Emphysema Maternal Grandfather        smoked a pipe    Social History   Tobacco Use   Smoking status: Former    Current packs/day: 0.00    Average packs/day: 1 pack/day for 10.0 years (10.0 ttl pk-yrs)     Types: Cigarettes    Start date: 12/19/1959    Quit date: 12/18/1969    Years since quitting: 54.4    Passive exposure: Past   Smokeless tobacco: Never  Vaping Use   Vaping status: Never Used  Substance Use Topics   Alcohol use: Yes    Comment: ocassional use   Drug use: No     Allergies  Allergen Reactions   Amoxicillin  Hives   Olmesartan Hives   Ace Inhibitors Other (See Comments)    unknown   Bee Venom    Benicar [Olmesartan Medoxomil] Other (See Comments)    Tongue swelling   Codeine     REACTION: nausea   Covid-19 (Mrna) Vaccine     Increased shortness of Breath in Fibrosis   Lisinopril Other (See Comments)    unknown   Macrobid  [Nitrofurantoin  Monohyd Macro]     Pulmonary Fibrosis   Nutritional Supplements     Bee stings cause  angioedema-carry an EPIPEN    Penicillins    Shingrix [Zoster Vac Recomb Adjuvanted] Other (See Comments)    Chills, feverish, nausea x 4 days    Review of Systems NEGATIVE UNLESS OTHERWISE INDICATED IN HPI      Objective:     BP 124/64 (BP Location: Left Arm, Patient Position: Sitting, Cuff Size: Normal)   Pulse 69   Temp 98.4 F (36.9 C) (Temporal)   Ht 5\' 2"  (1.575 m)   Wt 153 lb 3.2 oz (69.5 kg)   SpO2 99%   BMI 28.02 kg/m   Wt Readings from Last 3 Encounters:  04/30/24 153 lb 3.2 oz (69.5 kg)  04/28/24 153 lb (69.4 kg)  04/09/24 157 lb (71.2 kg)    BP Readings from Last 3 Encounters:  04/30/24 124/64  04/28/24 130/68  04/09/24 116/72     Physical Exam Vitals and nursing note reviewed.  Constitutional:      Appearance: Normal appearance.  Eyes:     Extraocular Movements: Extraocular movements intact.     Conjunctiva/sclera: Conjunctivae normal.     Pupils: Pupils are equal, round, and reactive to light.  Cardiovascular:     Rate and Rhythm: Normal rate and regular rhythm.     Pulses: Normal pulses.          Dorsalis pedis pulses are 2+ on the right side and 2+ on the left side.       Posterior tibial pulses  are 2+ on the right side and 2+ on the left side.     Heart sounds: Murmur (2/6 RUSB) heard.  Pulmonary:     Effort: Pulmonary effort is normal.     Breath sounds: Normal breath sounds.  Musculoskeletal:     Right lower leg: No edema.     Left lower leg: No edema.  Skin:    Findings: No lesion or rash.  Neurological:     General: No focal deficit present.     Mental Status: She is alert and oriented to person, place, and time.     Sensory: Sensory deficit (scattered areas bilateral lower feet and ankles) present.  Psychiatric:        Mood and Affect: Mood normal.        Behavior: Behavior normal.             Corky Blumstein M Javionna Leder, PA-C

## 2024-05-14 ENCOUNTER — Telehealth: Payer: Self-pay | Admitting: Physician Assistant

## 2024-05-14 DIAGNOSIS — J8410 Pulmonary fibrosis, unspecified: Secondary | ICD-10-CM

## 2024-05-14 NOTE — Telephone Encounter (Addendum)
 Copied from CRM [id number]. Topic: Clinical - Medication Refill >> May 14, 2024 10:36 AM Juliana Ocean wrote: Medication: Pirfenidone  (ESBRIET ) 267 MG CAPS  Has the patient contacted their pharmacy? No This is done through specialty pharmacy.   This is the patient's preferred pharmacy:   [facility] - [place], [place] - [address] [place] [address] Phone: [phone] Fax: [phone]  Is this the correct pharmacy for this prescription? Yes  Has the prescription been filled recently? Yes  Is the patient out of the medication? No  Has the patient been seen for an appointment in the last year OR does the patient have an upcoming appointment? Yes  Can we respond through MyChart? No  Pt very adamant that this Rx needs to be written w/ enough refills to get her through the end of the year.  She said it cannot be written w/ no refills anymore. She would like to speak w/ Dr Isabel Many nurse and get this sorted out today. Call her on [phone]

## 2024-05-19 MED ORDER — PIRFENIDONE 267 MG PO CAPS: ORAL_CAPSULE | ORAL | 0 refills | Status: DC

## 2024-05-19 NOTE — Addendum Note (Signed)
 Addended byGregory Leash, Alrick Cubbage A on: 05/19/2024 10:22 AM   Modules accepted: Orders

## 2024-05-19 NOTE — Telephone Encounter (Addendum)
 Dr. Waylan Haggard please advise on Esbriet  refill. Order is pended.

## 2024-05-21 MED ORDER — PIRFENIDONE 267 MG PO CAPS: 801.0000 mg | ORAL_CAPSULE | Freq: Three times a day (TID) | ORAL | 1 refills | Status: DC

## 2024-05-21 NOTE — Telephone Encounter (Signed)
 Advised patient that rx has been sent to [facility] electronically and then [facility] will confirm her enrollment to allow pharmacy to fill. I have advised her of our office policy but that I have sent in 6 month supply this time since her labs are updated. She verbalized understanding  Geraldene Kleine, PharmD, MPH, BCPS, CPP Clinical Pharmacist (Rheumatology and Pulmonology)

## 2024-05-21 NOTE — Addendum Note (Signed)
 Addended by: Thais Fill on: 05/21/2024 10:35 AM   Modules accepted: Orders

## 2024-05-21 NOTE — Telephone Encounter (Signed)
 Ccing the pharmacy team as Esbriet  prescriptions are sent from them.

## 2024-05-21 NOTE — Telephone Encounter (Signed)
 It is office policy that specialty medications are not sent for more than 6 months. Pirfenidone  requires regular lab monitoring and follow-up appointments. This applies to all patients.  I have sent refill for 6 months.  LFTs updated on 04/09/2024 wnl  Geraldene Kleine, PharmD, MPH, BCPS, CPP Clinical Pharmacist (Rheumatology and Pulmonology)

## 2024-05-31 DIAGNOSIS — J849 Interstitial pulmonary disease, unspecified: Secondary | ICD-10-CM | POA: Diagnosis not present

## 2024-05-31 DIAGNOSIS — G4733 Obstructive sleep apnea (adult) (pediatric): Secondary | ICD-10-CM | POA: Diagnosis not present

## 2024-06-30 DIAGNOSIS — G4733 Obstructive sleep apnea (adult) (pediatric): Secondary | ICD-10-CM | POA: Diagnosis not present

## 2024-06-30 DIAGNOSIS — J849 Interstitial pulmonary disease, unspecified: Secondary | ICD-10-CM | POA: Diagnosis not present

## 2024-07-22 ENCOUNTER — Telehealth: Payer: Self-pay | Admitting: Physician Assistant

## 2024-07-22 DIAGNOSIS — J8410 Pulmonary fibrosis, unspecified: Secondary | ICD-10-CM

## 2024-07-22 DIAGNOSIS — J84112 Idiopathic pulmonary fibrosis: Secondary | ICD-10-CM

## 2024-07-22 MED ORDER — ESBRIET 267 MG PO TABS: 801.0000 mg | ORAL_TABLET | Freq: Three times a day (TID) | ORAL | 1 refills | Status: AC

## 2024-07-22 NOTE — Telephone Encounter (Signed)
 Called patient regarding Esbriet  formulation change. She is aware of capsule to tablet change and okay with this. She has been advised that tablet rx has been sent to [facility] pharmacy today   Sherry Pennant, PharmD, MPH, BCPS, CPP Clinical Pharmacist (Rheumatology and Pulmonology)

## 2024-07-22 NOTE — Telephone Encounter (Signed)
 Copied from CRM #[id number]. Topic: Clinical - Prescription Issue >> Jul 22, 2024  9:03 AM Nathanel DEL wrote: Reason for CRM: Maria [name] w/ [facility] states the program no longer covers the capsules for med .   Pirfenidone  (ESBRIET ) 267 MG CAPS  Only covers the tablets.  Pt has a shipment that is to go out today.  Any way we can get this changed and submitted asap?  Cb: [phone] (Any pharmacist)  Fax: [phone] (Please try to do electronically, or ideally a phone call to their pharmacy, so they can make delivery to go out today.

## 2024-07-23 ENCOUNTER — Telehealth: Payer: Self-pay

## 2024-07-23 NOTE — Telephone Encounter (Signed)
 Copied from CRM [id number]. Topic: Clinical - Prescription Issue >> Jul 21, 2024  9:38 AM Maria [name] wrote: Reason for CRM: Patient states for prescription Pirfenidone  (ESBRIET ) 267 MG CAPS - it no longer comes in capsules but tablets instead. Patient is requesting [name] new refill order be sent for tablet forms and [name] call back once sent.   Callback number: [phone]    Has been address on 8/5 spoke with PT   NFN-

## 2024-08-12 ENCOUNTER — Telehealth: Payer: Self-pay

## 2024-08-12 NOTE — Telephone Encounter (Signed)
 Copied from CRM [id number]. Topic: Referral - Request for Referral >> Aug 12, 2024 10:03 AM Franky GRADE wrote: Did the patient discuss referral with their provider in the last year? Yes (If No - schedule appointment) (If Yes - send message)  Appointment offered? Yes  Type of order/referral and detailed reason for visit: Specialist for neuropathy   Preference of office, provider, location: Anywhere [name] Allwardt recommends,   If referral order, have you been seen by this specialty before? No (If Yes, this issue or another issue? When? Where?  Can we respond through MyChart? No, patient prefers a phone call.  Please see pt msg concerning request for referral to be placed for Neuropathy and advise. Last OV referral to Cardio discussed.

## 2024-08-12 NOTE — Telephone Encounter (Signed)
 Called pt and advised PCP recommendations, Patient asked contact number and appt info to see Dr Joane and advised office phone number to call and schedule her office visit. Pt verbalized understanding

## 2024-08-20 ENCOUNTER — Ambulatory Visit: Admitting: Family Medicine

## 2024-08-20 NOTE — Progress Notes (Deleted)
   LILLETTE Ileana Collet, PhD, LAT, ATC acting as a scribe for Artist Lloyd, MD.  Maria [name] is a 83 y.o. female who presents to [facility] at [facility] today for bilat LE numbness x ***. Pt locates pain to ***  Low back pain: Radiating pain: LE numbness/tingling: LE weakness: Aggravates: Treatments tried:  Pertinent review of systems: ***  Relevant historical information: ***   Exam:  There were no vitals taken for this visit. General: Well Developed, well nourished, and in no acute distress.   MSK: ***    Lab and Radiology Results No results found for this or any previous visit (from the past 72 hours). No results found.     Assessment and Plan: 83 y.o. female with ***   PDMP not reviewed this encounter. No orders of the defined types were placed in this encounter.  No orders of the defined types were placed in this encounter.    Discussed warning signs or symptoms. Please see discharge instructions. Patient expresses understanding.   ***

## 2024-08-26 NOTE — Progress Notes (Unsigned)
   LILLETTE Ileana Collet, PhD, LAT, ATC acting as a scribe for Artist Lloyd, MD.  Corbett DELENA Maclachlan is a 83 y.o. female who presents to [facility] at [facility] today for bilat LE numbness x ***. Pt locates pain to ***  Low back pain: Radiating pain: LE numbness/tingling: LE weakness: Aggravates: Treatments tried:  Pertinent review of systems: ***  Relevant historical information: ***   Exam:  There were no vitals taken for this visit. General: Well Developed, well nourished, and in no acute distress.   MSK: ***    Lab and Radiology Results No results found for this or any previous visit (from the past 72 hours). No results found.     Assessment and Plan: 83 y.o. female with ***   PDMP not reviewed this encounter. No orders of the defined types were placed in this encounter.  No orders of the defined types were placed in this encounter.    Discussed warning signs or symptoms. Please see discharge instructions. Patient expresses understanding.   ***

## 2024-08-27 ENCOUNTER — Encounter: Payer: Self-pay | Admitting: Family Medicine

## 2024-08-27 ENCOUNTER — Ambulatory Visit (INDEPENDENT_AMBULATORY_CARE_PROVIDER_SITE_OTHER): Admitting: Family Medicine

## 2024-08-27 ENCOUNTER — Ambulatory Visit

## 2024-08-27 VITALS — BP 124/80 | HR 112 | Ht 62.0 in | Wt 150.0 lb

## 2024-08-27 DIAGNOSIS — G8929 Other chronic pain: Secondary | ICD-10-CM | POA: Diagnosis not present

## 2024-08-27 DIAGNOSIS — M419 Scoliosis, unspecified: Secondary | ICD-10-CM | POA: Diagnosis not present

## 2024-08-27 DIAGNOSIS — M48061 Spinal stenosis, lumbar region without neurogenic claudication: Secondary | ICD-10-CM | POA: Diagnosis not present

## 2024-08-27 DIAGNOSIS — R202 Paresthesia of skin: Secondary | ICD-10-CM

## 2024-08-27 DIAGNOSIS — M5441 Lumbago with sciatica, right side: Secondary | ICD-10-CM

## 2024-08-27 DIAGNOSIS — M4316 Spondylolisthesis, lumbar region: Secondary | ICD-10-CM | POA: Diagnosis not present

## 2024-08-27 DIAGNOSIS — M5442 Lumbago with sciatica, left side: Secondary | ICD-10-CM

## 2024-08-27 DIAGNOSIS — M47816 Spondylosis without myelopathy or radiculopathy, lumbar region: Secondary | ICD-10-CM | POA: Diagnosis not present

## 2024-08-27 NOTE — Patient Instructions (Addendum)
 Thank you for coming in today.   Please get an Xray today before you leave   I've referred you to Neurology for a Nerve Conduction Study.  Let us  know if you don't hear from them in one week.   Check back after we get these test results

## 2024-09-03 DIAGNOSIS — H35373 Puckering of macula, bilateral: Secondary | ICD-10-CM | POA: Diagnosis not present

## 2024-09-04 ENCOUNTER — Ambulatory Visit: Payer: Self-pay | Admitting: Family Medicine

## 2024-09-04 NOTE — Progress Notes (Signed)
 Low back x-ray shows arthritis and a bit of scoliosis.

## 2024-09-05 ENCOUNTER — Telehealth: Payer: Self-pay | Admitting: Family Medicine

## 2024-09-05 NOTE — Telephone Encounter (Signed)
 Pt returning call for xray results. Read results, pt has questions ( are we ordering MRI?), please return call.

## 2024-09-09 NOTE — Telephone Encounter (Signed)
Called pt, left VM to call the office.  

## 2024-09-09 NOTE — Telephone Encounter (Signed)
 Per visit note 08/27/24:   Assessment and Plan: 83 y.o. female with bilateral lower extremity painful paresthesia and subjective numbness.  Symptoms are most consistent with peripheral neuropathy although lumbar radiculopathy or spinal stenosis is definitely a possibility.  She already has done physical therapy last year for back pain and a similar problem and is not excited about doing any more physical therapy at this time.  For now we will obtain a nerve conduction study with a backup plan for MRI lumbar spine if needed.  If nerve conduction study is positive for peripheral neuropathy will refer to neurosurgery for evaluation of peripheral nerve stimulator for neuropathy.    She is very opposed to taking gabapentin  as she is known people who had significant mental problems with gabapentin .    EMG/NCV ordered, not yet scheduled.

## 2024-09-10 NOTE — Telephone Encounter (Signed)
 Looks like next step was to proceed with a nerve conduction study.  Has she scheduled this yet?

## 2024-09-11 ENCOUNTER — Encounter: Payer: Self-pay | Admitting: Neurology

## 2024-09-11 ENCOUNTER — Other Ambulatory Visit: Payer: Self-pay

## 2024-09-11 DIAGNOSIS — R202 Paresthesia of skin: Secondary | ICD-10-CM

## 2024-09-11 NOTE — Telephone Encounter (Signed)
 Pt returned VM. She stated she had not received a call from neurology about scheduling NVC study. Referral note states waiting on note. Provided pt w/ phone number to call [facility] to call and schedule. Pt verbalized understanding.

## 2024-09-11 NOTE — Telephone Encounter (Signed)
 Called pt at [phone], left VM to call the office.

## 2024-09-25 ENCOUNTER — Ambulatory Visit (INDEPENDENT_AMBULATORY_CARE_PROVIDER_SITE_OTHER): Admitting: Neurology

## 2024-09-25 DIAGNOSIS — M5417 Radiculopathy, lumbosacral region: Secondary | ICD-10-CM

## 2024-09-25 DIAGNOSIS — R202 Paresthesia of skin: Secondary | ICD-10-CM | POA: Diagnosis not present

## 2024-09-25 NOTE — Procedures (Signed)
 [facility]  [address] [place], [address]  [place], [place] [address] Tel: [phone] Fax: [phone] Test Date:  09/25/2024  Patient: Maria [name] DOB: 03-31-1941 Physician: Tonita Blanch, DO  Sex: Female Height: 5' 2 Ref Phys: Artist CANDIE Lloyd, MD  ID#: [id number]   Technician:    History: This is a 83 year old female referred for evaluation of bilateral leg numbness.  NCV & EMG Findings: Extensive electrodiagnostic testing of the right lower extremity and additional studies of the left shows: Bilateral sural and superficial peroneal sensory responses are within normal limits. Bilateral peroneal and tibial motor responses are within normal limits. Bilateral tibial H reflex studies are within normal limits. Chronic motor axon loss changes are seen affecting all the tested muscles in the lower extremities involving the L2, L3, L4, L5, and S1 nerve roots/segments.  Impression: Chronic multilevel radiculopathies affecting bilateral L3, L4, L5 and S1 nerve roots/segments bilaterally, which is worse at L5 (moderate). There is no evidence of a large fiber sensorimotor polyneuropathy affecting the lower extremities.   ___________________________ Tonita Blanch, DO    Nerve Conduction Studies   Stim Site NR Peak (ms) Norm Peak (ms) O-P Amp (V) Norm O-P Amp  Left Sup Peroneal Anti Sensory (Ant Lat Mall)  32 C  12 cm    2.4 <4.6 6.3 >3  Right Sup Peroneal Anti Sensory (Ant Lat Mall)  32 C  12 cm    2.1 <4.6 8.6 >3  Left Sural Anti Sensory (Lat Mall)  32 C  Calf    2.8 <4.6 7.8 >3  Right Sural Anti Sensory (Lat Mall)  32 C  Calf    2.7 <4.6 6.2 >3     Stim Site NR Onset (ms) Norm Onset (ms) O-P Amp (mV) Norm O-P Amp Site1 Site2 Delta-0 (ms) Dist (cm) Vel (m/s) Norm Vel (m/s)  Left Peroneal Motor (Ext Dig Brev)  32 C  Ankle    4.1 <6.0 3.8 >2.5 B Fib Ankle 8.5 34.0 40 >40  B Fib    12.6  3.4  Poplt B Fib 1.5 8.0 53 >40  Poplt    14.1  3.4         Right Peroneal  Motor (Ext Dig Brev)  32 C  Ankle    2.5 <6.0 3.0 >2.5 B Fib Ankle 8.0 35.0 44 >40  B Fib    10.5  2.3  Poplt B Fib 1.7 8.0 47 >40  Poplt    12.2  2.3         Left Tibial Motor (Abd Hall Brev)  32 C  Ankle    4.1 <6.0 6.3 >4 Knee Ankle 8.3 40.0 48 >40  Knee    12.4  4.1         Right Tibial Motor (Abd Hall Brev)  32 C  Ankle    3.8 <6.0 5.0 >4 Knee Ankle 9.2 42.0 46 >40  Knee    13.0  3.9          Electromyography   Side Muscle Ins.Act Fibs Fasc Recrt Amp Dur Poly Activation Comment  Right AntTibialis Nml Nml Nml *1- *1+ *1+ *1+ Nml N/A  Right Gastroc Nml Nml Nml *1- *1+ *1+ *1+ Nml N/A  Right Flex Dig Long Nml Nml Nml *2- *1+ *1+ *1+ Nml N/A  Right RectFemoris Nml Nml Nml *1- *1+ *1+ *1+ Nml N/A  Right BicepsFemS Nml Nml Nml *1- *1+ *1+ *1+ Nml N/A  Right GluteusMed Nml Nml Nml *1- *1+ *  1+ *1+ Nml N/A  Left AntTibialis Nml Nml Nml *2- *1+ *1+ *1+ Nml N/A  Left Gastroc Nml Nml Nml *1- *1+ *1+ *1+ Nml N/A  Left Flex Dig Long Nml Nml Nml *2- *1+ *1+ *1+ Nml N/A  Left RectFemoris Nml Nml Nml *1- *1+ *1+ *1+ Nml N/A  Left GluteusMed Nml Nml Nml *1- *1+ *1+ *1+ Nml N/A  Left AdductorLong Nml Nml Nml *1- *1+ *1+ *1+ Nml N/A  Right AdductorLong Nml Nml Nml *1- *1+ *1+ *1+ Nml N/A      Waveforms:

## 2024-09-26 ENCOUNTER — Ambulatory Visit: Payer: Self-pay | Admitting: Family Medicine

## 2024-09-26 ENCOUNTER — Other Ambulatory Visit: Payer: Self-pay

## 2024-09-26 ENCOUNTER — Telehealth: Payer: Self-pay

## 2024-09-26 DIAGNOSIS — R1319 Other dysphagia: Secondary | ICD-10-CM

## 2024-09-26 DIAGNOSIS — I10 Essential (primary) hypertension: Secondary | ICD-10-CM

## 2024-09-26 DIAGNOSIS — G8929 Other chronic pain: Secondary | ICD-10-CM

## 2024-09-26 MED ORDER — HYDROCHLOROTHIAZIDE 12.5 MG PO CAPS: 12.5000 mg | ORAL_CAPSULE | Freq: Every day | ORAL | 3 refills | Status: AC

## 2024-09-26 MED ORDER — SIMVASTATIN 10 MG PO TABS: ORAL_TABLET | ORAL | 3 refills | Status: AC

## 2024-09-26 MED ORDER — MONTELUKAST SODIUM 10 MG PO TABS: 10.0000 mg | ORAL_TABLET | Freq: Every day | ORAL | 3 refills | Status: AC

## 2024-09-26 MED ORDER — CLOPIDOGREL BISULFATE 75 MG PO TABS: 75.0000 mg | ORAL_TABLET | Freq: Every day | ORAL | 3 refills | Status: AC

## 2024-09-26 MED ORDER — PANTOPRAZOLE SODIUM 40 MG PO TBEC: 40.0000 mg | DELAYED_RELEASE_TABLET | Freq: Two times a day (BID) | ORAL | 3 refills | Status: AC

## 2024-09-26 MED ORDER — OXYBUTYNIN CHLORIDE ER 10 MG PO TB24: ORAL_TABLET | ORAL | 3 refills | Status: AC

## 2024-09-26 MED ORDER — AMLODIPINE BESYLATE 10 MG PO TABS: 10.0000 mg | ORAL_TABLET | Freq: Every day | ORAL | 3 refills | Status: AC

## 2024-09-26 MED ORDER — POTASSIUM CHLORIDE CRYS ER 10 MEQ PO TBCR: 10.0000 meq | EXTENDED_RELEASE_TABLET | Freq: Every day | ORAL | 3 refills | Status: AC

## 2024-09-26 MED ORDER — LEVOCETIRIZINE DIHYDROCHLORIDE 5 MG PO TABS: 5.0000 mg | ORAL_TABLET | Freq: Every evening | ORAL | 3 refills | Status: AC

## 2024-09-26 MED ORDER — TRIMETHOPRIM 100 MG PO TABS: 100.0000 mg | ORAL_TABLET | Freq: Every day | ORAL | 3 refills | Status: AC

## 2024-09-26 NOTE — Telephone Encounter (Signed)
 Copied from CRM [id number]. Topic: General - Other >> Sep 26, 2024  9:44 AM Rosina BIRCH wrote: Reason for CRM: patient called stating she want all her medication sent to [facility] electronically. Patient thought it was done in January. Patient is out of medication   [facility] [address] [place], [place] [address] Pharmacy [phone]  CB for the patient [phone]  Returned pt call and was advised when giving front desk her new [place] healthcare card in January she assumed this had been corrected. Advised pt correct pharmacy now listed for her and all prescriptions sent to [facility] for her. Pt verbalized understanding.

## 2024-09-26 NOTE — Progress Notes (Signed)
 Nerve conduction study shows multiple pinched nerves coming out of the spine which would cause numbness all over your legs.  No evidence of neuropathy.  I am going to move to an MRI of the lumbar spine.  You should hear soon about scheduling.  This was a surprising study.  You should hear soon about scheduling the MRI.

## 2024-10-20 ENCOUNTER — Ambulatory Visit
Admission: RE | Admit: 2024-10-20 | Discharge: 2024-10-20 | Disposition: A | Source: Ambulatory Visit | Attending: Family Medicine | Admitting: Family Medicine

## 2024-10-20 ENCOUNTER — Ambulatory Visit
Admission: RE | Admit: 2024-10-20 | Discharge: 2024-10-20 | Disposition: A | Discharge status: Home / Self Care | Source: Ambulatory Visit | Attending: Pulmonary Disease | Admitting: Pulmonary Disease

## 2024-10-20 DIAGNOSIS — G8929 Other chronic pain: Secondary | ICD-10-CM

## 2024-10-20 DIAGNOSIS — J84112 Idiopathic pulmonary fibrosis: Secondary | ICD-10-CM

## 2024-10-27 ENCOUNTER — Ambulatory Visit: Payer: Self-pay | Admitting: Family Medicine

## 2024-10-27 NOTE — Progress Notes (Signed)
 Lumbar spine MRI shows facet arthritis which could cause back pain and a potential pinched nerve which could cause pain running down the leg.  Recommend return to clinic to go the results of this MRI and the nerve conduction study in more detail.  Talk about treatments including potential injections in the back.

## 2024-10-29 ENCOUNTER — Ambulatory Visit: Payer: Self-pay | Admitting: Pulmonary Disease

## 2024-10-29 NOTE — Telephone Encounter (Signed)
 Copied from CRM [id number]. Topic: Clinical - Lab/Test Results >> Oct 29, 2024  4:11 PM Leila BROCKS wrote: Reason for CRM: Patient [phone] is returning the office call on CT chest results. Unable to reach CAL. Patient states she will not be available tomorrow and wants a call back today. Please call back.  Mannam, Praveen, MD to [facility] Clinical  [facility], [facility]    10/29/24  1:42 PM Result Note CT shows stable scarring in the lung.  Continue current therapy. CT CHEST HIGH RESO  I spoke with the pt and notified of results/recs per Dr. Theophilus. Pt verbalized understanding. Nothing further needed.

## 2024-11-11 ENCOUNTER — Ambulatory Visit: Admitting: Family Medicine

## 2024-11-11 VITALS — BP 110/68 | HR 86 | Ht 62.0 in | Wt 148.0 lb

## 2024-11-11 DIAGNOSIS — R202 Paresthesia of skin: Secondary | ICD-10-CM

## 2024-11-11 DIAGNOSIS — M546 Pain in thoracic spine: Secondary | ICD-10-CM | POA: Diagnosis not present

## 2024-11-11 DIAGNOSIS — G8929 Other chronic pain: Secondary | ICD-10-CM

## 2024-11-11 DIAGNOSIS — M5441 Lumbago with sciatica, right side: Secondary | ICD-10-CM | POA: Diagnosis not present

## 2024-11-11 DIAGNOSIS — M5442 Lumbago with sciatica, left side: Secondary | ICD-10-CM | POA: Diagnosis not present

## 2024-11-11 NOTE — Progress Notes (Signed)
 LILLETTE Ileana Collet, PhD, LAT, ATC acting as a scribe for Artist Lloyd, MD.  Maria [name] is a 83 y.o. female who presents to [facility] at [facility] today for LBP and bilat LE paresthesia w/ L-spine and NCV study review. Pt was last seen by Dr. Lloyd on 08/27/24 and was further testing was ordered.  Today, pt reports pain in her upper back that is constant. Paresthesia is progressively worsening and she has had to give up hiking.  Dx testing: 10/20/24 L-spine MRI  09/11/24 NCV study  08/27/24 L-spine XR  Pertinent review of systems: No fevers or chills  Relevant historical information: Pulmonary fibrosis.   Exam:  BP 110/68   Pulse 86   Ht 5' 2 (1.575 m)   Wt 148 lb (67.1 kg)   SpO2 91%   BMI 27.07 kg/m  General: Well Developed, well nourished, and in no acute distress.   MSK: T-spine nontender to palpation midline.  Tender palpation paraspinal musculature.  Normal thoracic motion.  Upper Smir strength is intact.  L-spine normal appearing.  Normal motion intact strength.  Lab and Radiology Results  CT scan images thoracic spine visible on CT scan chest from October 20, 2024 personally and independently interpreted today.  No acute fractures multilevel degenerative changes.   EXAM: MRI LUMBAR SPINE WITHOUT CONTRAST   TECHNIQUE: Multiplanar, multisequence MR imaging of the lumbar spine was performed. No intravenous contrast was administered.   COMPARISON:  Radiography 08/27/2024   FINDINGS: Segmentation:  5 lumbar type vertebral bodies assumed.   Alignment: Thoracolumbar curvature convex to the left and lower lumbar curvature convex to the right. 1-2 mm of degenerative anterolisthesis at L4-5.   Vertebrae: No fracture or focal bone lesion. Mild edematous changes so she aided with the facet joints at L4-5.   Conus medullaris and cauda equina: Conus extends to the L1 level. Conus and cauda equina appear normal.   Paraspinal and other soft  tissues: Negative   Disc levels:   Mild bulging of the discs and mild facet osteoarthritis at L3-4 and above but no compressive stenosis of the canal, lateral recesses or foramina.   L4-5: Bilateral facet osteoarthritis with 1-2 mm of anterolisthesis. Mild bulging of the disc more prominent towards the left. Stenosis of the left lateral recess and intervertebral foramen on the left that could possibly cause left-sided radicular pain. The facet arthritis could be a cause of back pain or referred facet syndrome pain.   L5-S1: Disc degeneration with endplate osteophytes and bulging of the disc more pronounced on the right. No central canal stenosis. Mild right foraminal narrowing but without likely neural compression.   IMPRESSION: 1. Thoracolumbar scoliosis. 2. L4-5: Bilateral facet osteoarthritis with 1-2 mm of anterolisthesis. Bulging of the disc more prominent towards the left. Stenosis of the left lateral recess and intervertebral foramen on the left that could possibly cause left-sided radicular pain. The facet arthritis could be a cause of back pain or referred facet syndrome pain. 3. L5-S1: Disc degeneration with endplate osteophytes and bulging of the disc more pronounced on the right. Mild right foraminal narrowing but without likely neural compression.     Electronically Signed   By: Oneil Officer M.D.   On: 10/24/2024 11:12  I, Artist Lloyd, personally (independently) visualized and performed the interpretation of the images attached in this note.   Nerve conduction study September 11, 2024: NCV & EMG Findings: Extensive electrodiagnostic testing of the right lower extremity and additional studies of the  left shows: Bilateral sural and superficial peroneal sensory responses are within normal limits. Bilateral peroneal and tibial motor responses are within normal limits. Bilateral tibial H reflex studies are within normal limits. Chronic motor axon loss changes are  seen affecting all the tested muscles in the lower extremities involving the L2, L3, L4, L5, and S1 nerve roots/segments.   Impression: Chronic multilevel radiculopathies affecting bilateral L3, L4, L5 and S1 nerve roots/segments bilaterally, which is worse at L5 (moderate). There is no evidence of a large fiber sensorimotor polyneuropathy affecting the lower extremities.    Assessment and Plan: 83 y.o. female with bilateral lower extremity paresthesia left L5 dermatomal pattern worse than other locations.  Dominant problems are due to multilevel lumbar radiculopathy.  After discussion and review of MRI and nerve conduction study we will proceed with epidural steroid injection lumbar spine.  She will need to stop the Plavix  prior to the injection.  Additionally she notes thoracic back pain.  This is due to degenerative changes and muscle dysfunction.  Plan for home exercise program.  Consider formal physical therapy referral if not improved.  We talked about medications and decided against them for now.  Recommend heating pad as well.   PDMP not reviewed this encounter. Orders Placed This Encounter  Procedures   DG INJECT DIAG/THERA/INC NEEDLE/CATH/PLC EPI/LUMB/SAC W/IMG    Level and technique per radiology    Standing Status:   Future    Expiration Date:   11/11/2025    Reason for Exam (SYMPTOM  OR DIAGNOSIS REQUIRED):   Low back pain    Preferred Imaging Location?:   [facility]   No orders of the defined types were placed in this encounter.    Discussed warning signs or symptoms. Please see discharge instructions. Patient expresses understanding.   The above documentation has been reviewed and is accurate and complete Artist Lloyd, M.D.

## 2024-11-11 NOTE — Patient Instructions (Addendum)
 Thank you for coming in today.   Please call [facility] ([facility]) at [phone] to schedule your spine injection.    Try using a heating pad  Please work on the home exercises the athletic trainer went over with you:  View at [email or web address] code [email or web address]  Let me know if you would like a referral to physical therapy.

## 2024-11-17 ENCOUNTER — Encounter: Payer: Self-pay | Admitting: Pulmonary Disease

## 2024-11-17 ENCOUNTER — Ambulatory Visit: Admitting: Pulmonary Disease

## 2024-11-17 VITALS — BP 132/74 | HR 53 | Temp 98.1°F | Ht 62.0 in | Wt 148.0 lb

## 2024-11-17 DIAGNOSIS — J84112 Idiopathic pulmonary fibrosis: Secondary | ICD-10-CM | POA: Diagnosis not present

## 2024-11-17 DIAGNOSIS — R053 Chronic cough: Secondary | ICD-10-CM

## 2024-11-17 DIAGNOSIS — Z5181 Encounter for therapeutic drug level monitoring: Secondary | ICD-10-CM

## 2024-11-17 DIAGNOSIS — Z87891 Personal history of nicotine dependence: Secondary | ICD-10-CM

## 2024-11-17 DIAGNOSIS — R0602 Shortness of breath: Secondary | ICD-10-CM

## 2024-11-17 DIAGNOSIS — R093 Abnormal sputum: Secondary | ICD-10-CM

## 2024-11-17 DIAGNOSIS — I272 Pulmonary hypertension, unspecified: Secondary | ICD-10-CM | POA: Diagnosis not present

## 2024-11-17 LAB — COMPREHENSIVE METABOLIC PANEL WITH GFR
ALT: 11 U/L (ref 0–35)
AST: 17 U/L (ref 0–37)
Albumin: 4.3 g/dL (ref 3.5–5.2)
Alkaline Phosphatase: 70 U/L (ref 39–117)
BUN: 9 mg/dL (ref 6–23)
CO2: 32 meq/L (ref 19–32)
Calcium: 9.9 mg/dL (ref 8.4–10.5)
Chloride: 102 meq/L (ref 96–112)
Creatinine, Ser: 0.69 mg/dL (ref 0.40–1.20)
GFR: 79.91 mL/min (ref >60.00)
Glucose, Bld: 98 mg/dL (ref 70–99)
Potassium: 4.3 meq/L (ref 3.5–5.1)
Sodium: 140 meq/L (ref 135–145)
Total Bilirubin: 0.4 mg/dL (ref 0.2–1.2)
Total Protein: 7 g/dL (ref 6.0–8.3)

## 2024-11-17 NOTE — Patient Instructions (Signed)
  VISIT SUMMARY: You had a follow-up visit to discuss your idiopathic pulmonary fibrosis, peripheral neuropathy, and other health concerns. Your breathing remains stable, and your medication is effective. We also addressed your leg numbness and pain, as well as your chronic sputum production.  YOUR PLAN: IDIOPATHIC PULMONARY FIBROSIS: Your condition is well-managed with minimal progression over the past four years. Your recent CT scan showed no new scarring. -Continue taking pirfenidone  as prescribed. -We ordered liver function tests today to monitor your health. -You are scheduled for an echocardiogram to check for pulmonary hypertension.  PERIPHERAL NEUROPATHY WITH LOWER EXTREMITY NUMBNESS AND PAIN: You have chronic peripheral neuropathy with worsening numbness and pain in your legs and feet. Previous imaging showed pinched nerves. -You are scheduled for an echocardiogram to assess for pulmonary hypertension.  CHRONIC HYPONATREMIA: You have intermittent low sodium levels, which were noted in previous lab tests. -We ordered liver function tests today to monitor your sodium levels.  CHRONIC BACK PAIN WITH SCOLIOSIS AND HISTORY OF PINCHED NERVES: You have chronic back pain with scoliosis and a history of pinched nerves. Previous imaging focused on your lower back. -We will continue to monitor your symptoms and consider further imaging if necessary.  CHRONIC SPUTUM PRODUCTION: You have increased sputum production, possibly related to dietary changes. -Continue to monitor your sputum production and dietary intake.

## 2024-11-17 NOTE — Progress Notes (Unsigned)
 Maria [name]    [id number]    [birth year]  Primary Care Physician:Allwardt, Mardy HERO, PA-C  Referring Physician: Allwardt, Mardy HERO, PA-C [address] [place],  [place] [address]  Chief complaint:  Follow-up for IPF, OSA Started pirfenidone  September 2021  HPI: 83 y.o. with history of hypertension, stroke, sleep apnea. Referred for evaluation of interstitial lung disease Started pirfenidone  September 2021 She had an exposure to DDT as a child ( middle School) and had severe respiratory problems ever since, including multiple flu illnesses that would take her 6-8 weeks to recover from.  Finished pulmonary rehab in 2022 [facility] for second opinion in 2022 She developed COVID-19 in January 2023 and was treated with molnupiravir .  Interim history: Discussed the use of AI scribe software for clinical note transcription with the patient, who gave verbal consent to proceed.  History of Present Illness Maria [name] is an 83 year old female with idiopathic pulmonary fibrosis who presents for follow-up.  Dyspnea and pulmonary function - Idiopathic pulmonary fibrosis diagnosed prior to 2021 - Breathing is stable but not as good as in prior years - Remains physically active with dancing, water aerobics, and hiking, but has more days resting at home - Increased sputum production - No adverse effects from pirfenidone   Peripheral neuropathy - Progressive leg numbness limiting hiking - Burning and cold sensations in legs and feet - Prior CT and MRI demonstrated pinched nerves - Concern about circulation - No significant swelling of feet or ankles  Medication tolerance and nutrition - Takes pirfenidone  since 2021 for IPF - Occasional difficulty eating enough to take medication - Sometimes relies on milkshakes to ingest pirfenidone    Relevant pulmonary history Pets: No pets Occupation: Retired designer, 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  No mold, hot tub, Jacuzzi.  No asbestos, down pillows or comforters Smoking history: 5-pack-year smoker.  Quit in 1971 Travel history: No significant travel history Relevant family history: Grandfather had emphysema.  He was a pipe smoker  Outpatient Encounter Medications as of 11/17/2024  Medication Sig   amLODipine  (NORVASC ) 10 MG tablet Take 1 tablet (10 mg total) by mouth daily.   CALCIUM PO Take by mouth daily.   Cholecalciferol (VITAMIN D-3 PO) Take by mouth daily.   clopidogrel  (PLAVIX ) 75 MG tablet Take 1 tablet (75 mg total) by mouth daily.   co-enzyme Q-10 30 MG capsule Take 30 mg by mouth 3 (three) times daily.   Collagen Hydrolysate POWD 1 Scoop by Does not apply route daily.   Cyanocobalamin  (VITAMIN B 12 PO) Take by mouth.   ESBRIET  267 MG TABS Take 3 tablets (801 mg total) by mouth 3 (three) times daily with meals.   ferrous sulfate 324 MG TBEC Take 324 mg by mouth every other day.   hydrochlorothiazide  (MICROZIDE ) 12.5 MG capsule Take 1 capsule (12.5 mg total) by mouth daily.   levocetirizine (XYZAL ) 5 MG tablet Take 1 tablet (5 mg total) by mouth every evening.   LUTEIN PO Take by mouth daily.   montelukast  (SINGULAIR ) 10 MG tablet Take 1 tablet (10 mg total) by mouth at bedtime.   Multiple Vitamin (VITAMIN E/FOLIC ACID/B-6/B-12 PO) Take by mouth. Vit  b 6   Multiple Vitamins-Minerals (PRESERVISION AREDS 2 PO) Take by mouth.   oxybutynin  (DITROPAN -XL) 10 MG 24 hr tablet  TAKE 1 TABLET EVERY DAY   pantoprazole  (PROTONIX ) 40 MG tablet Take 1 tablet (40 mg total) by mouth 2 (two) times daily.   potassium chloride  (KLOR-CON  M) 10 MEQ tablet Take 1 tablet (10 mEq total) by mouth daily.   simvastatin  (ZOCOR ) 10 MG tablet TAKE 1 TABLET EVERY DAY  AT  6  PM   trimethoprim  (TRIMPEX ) 100 MG tablet Take 1 tablet (100 mg total) by mouth daily.   No facility-administered encounter  medications on file as of 11/17/2024.   Vitals:   11/17/24 0942  BP: 132/74  Pulse: (!) 53  Temp: 98.1 F (36.7 C)  Height: 5' 2 (1.575 m)  Weight: 148 lb (67.1 kg)  SpO2: 94% Comment: room air  TempSrc: Oral  BMI (Calculated): 27.06     Physical Exam GEN: No acute distress. CV: Regular rate and rhythm, no murmurs. LUNGS: Slight crackles, otherwise clear to auscultation bilaterally, normal respiratory effort. SKIN JOINTS: Warm and dry, no rash.    Data Reviewed: Imaging: High-resolution CT 03/29/2017-basal gradient fibrotic changes with reticulation, traction bronchiectasis.  Moderate air trapping.  Probable UIP pattern.  High-res CT 04/15/2020-mildly progressive pulmonary fibrosis probable UIP pattern.  High-res CT 04/20/2022-stable pattern of fibrosis in probable UIP pattern.  High-res CT 04/20/2022-stable pattern of fibrosis in probable UIP pattern.  High resolution CT 05/16/2023-stable pattern of pulmonary fibrosis in probable UIP pattern  High-resolution CT lung 10/20/2024-stable pattern of pulmonary fibrosis compared to 2024.  Minimally progressive from 2021, moderate hiatal hernia, aortic atherosclerosis.  Enlarged pulmonary trunk. I have reviewed the images personally.  PFTs: 04/17/2017 FVC 1.69 [64%], FEV1 1.45 [73%], F/F 86, TLC 3.72 [76%], DLCO 14.90 [65%]  06/17/2020 FVC 1.72 [68%], FEV1 1.44 [76%], F/F 84, TLC 3.26 [66%], DLCO 14.53 [79%]   07/05/2022 FVC 1.69 [72%], FEV1 1.45 [84%], F/F86, TLC 3.31 [69%], DLCO 13.38 [76%] Mild restriction, minimal diffusion defect  04/08/2024 FVC 1.48 (6%), FEV1 1.26 [76%], F/F85, TLC 3.21 [69%], DLCO 12.99 [74%] Mild restriction, mild diffusion defect  6-minute walk test 06/30/2020- 443 m, nadir O2 sat of 90%  Labs: RAST panel 04/27/2015-IgE 40, sensitive to grass, tree pollen CBC 02/18/2018-WBC 6.2, eos 3.9%, absolute eosinophil count 242  N-terminal proBNP 09/10/2019 1-24  ILD serologies 04/09/2020-atypical p-ANCA  1:80  Sleep: PSG 07/31/17, AHI 6.4, SpO2 low 85%. CPAP titration 11/15/2017 CPAP 7  Cardiac: Echocardiogram 09/02/2021-LVEF 60 to 65%, RV systolic function is normal.  Normal PA systolic pressure.  No evidence of shunt by bubble study  Echocardiogram 03/02/2023-LVEF 60 to 65%, RV systolic function is normal, no pulmonary hypertension. Assessment & Plan Idiopathic pulmonary fibrosis Probable UIP pattern.  This is likely IPF with minimal exposures.  Serologies show mild elevation in ANCA which is likely nonspecific with no other signs and symptoms of autoimmune process.  Discussed findings in detail.  To make a diagnosis with certainty we would need a lung biopsy but not recommended given her age. Given mild progression on CT scan and PFTs with worsening symptoms we have decided to treat with antifibrotic  Well-managed with minimal progression over four years. No new scarring on recent CT compared to the last two years.  Pirfenidone  has been effective in slowing disease progression. - Continue pirfenidone  therapy - Ordered liver function tests today  Chronic sputum production Increased sputum production, possibly related to dietary changes. No significant changes in CT findings to suggest acute exacerbation. - Continue to monitor sputum production and dietary intake   Mild pulmonary hypertension Noted on echocardiogram in 2021 however  repeat study did not show any evidence of pulmonary hypertension.  Continue monitoring. Repeat echocardiogram ordered  Chronic cough Secondary to pulmonary fibrosis, GERD Continue PPI, Tessalon  Advised to raise head of the bed and not lay down at least 2 hours after eating Flutter valve and Mucinex for chest congestion  OSA Stable Download reviewed with good compliance  Plan/Recommendations: - Continue pirfenidone  - Check hepatic panel - Echocardiogram - Mucinex, flutter valve for chest congestion, cough  I personally spent a total of 46 minutes  in the care of the patient today including preparing to see the patient, getting/reviewing separately obtained history, performing a medically appropriate exam/evaluation, placing orders, documenting clinical information in the EHR, independently interpreting results, communicating results, and coordinating care.   Lonna Coder MD [facility] 11/17/2024, 9:49 AM  CC: Allwardt, Mardy HERO, PA-C

## 2024-11-18 ENCOUNTER — Ambulatory Visit (HOSPITAL_COMMUNITY): Admission: RE | Admit: 2024-11-18 | Discharge: 2024-11-18 | Attending: Pulmonary Disease

## 2024-11-18 DIAGNOSIS — I10 Essential (primary) hypertension: Secondary | ICD-10-CM | POA: Insufficient documentation

## 2024-11-18 DIAGNOSIS — I083 Combined rheumatic disorders of mitral, aortic and tricuspid valves: Secondary | ICD-10-CM | POA: Insufficient documentation

## 2024-11-18 DIAGNOSIS — R0600 Dyspnea, unspecified: Secondary | ICD-10-CM | POA: Insufficient documentation

## 2024-11-18 DIAGNOSIS — J84112 Idiopathic pulmonary fibrosis: Secondary | ICD-10-CM | POA: Diagnosis not present

## 2024-11-18 DIAGNOSIS — I77810 Thoracic aortic ectasia: Secondary | ICD-10-CM | POA: Diagnosis not present

## 2024-11-18 DIAGNOSIS — Z5181 Encounter for therapeutic drug level monitoring: Secondary | ICD-10-CM | POA: Insufficient documentation

## 2024-11-18 DIAGNOSIS — R0602 Shortness of breath: Secondary | ICD-10-CM | POA: Diagnosis not present

## 2024-11-18 LAB — ECHOCARDIOGRAM COMPLETE
AR max vel: 1.79 cm2
AV Area VTI: 1.94 cm2
AV Area mean vel: 1.68 cm2
AV Mean grad: 13 mmHg
AV Peak grad: 22.5 mmHg
Ao pk vel: 2.37 m/s
Area-P 1/2: 3.03 cm2
Calc EF: 59.7 %
S' Lateral: 1.65 cm
Single Plane A2C EF: 61.6 %
Single Plane A4C EF: 57.9 %

## 2024-11-18 LAB — PRO B NATRIURETIC PEPTIDE: NT-Pro BNP: <36 pg/mL (ref 0–738)

## 2024-11-19 ENCOUNTER — Ambulatory Visit: Admitting: Physician Assistant

## 2024-11-19 ENCOUNTER — Encounter: Payer: Self-pay | Admitting: Physician Assistant

## 2024-11-19 VITALS — BP 136/78 | HR 84 | Temp 97.9°F | Ht 62.0 in | Wt 147.0 lb

## 2024-11-19 DIAGNOSIS — J9611 Chronic respiratory failure with hypoxia: Secondary | ICD-10-CM

## 2024-11-19 DIAGNOSIS — E042 Nontoxic multinodular goiter: Secondary | ICD-10-CM | POA: Insufficient documentation

## 2024-11-19 DIAGNOSIS — M5416 Radiculopathy, lumbar region: Secondary | ICD-10-CM

## 2024-11-19 DIAGNOSIS — J8410 Pulmonary fibrosis, unspecified: Secondary | ICD-10-CM | POA: Diagnosis not present

## 2024-11-19 DIAGNOSIS — I4891 Unspecified atrial fibrillation: Secondary | ICD-10-CM | POA: Insufficient documentation

## 2024-11-19 DIAGNOSIS — N814 Uterovaginal prolapse, unspecified: Secondary | ICD-10-CM

## 2024-11-19 DIAGNOSIS — I499 Cardiac arrhythmia, unspecified: Secondary | ICD-10-CM

## 2024-11-19 NOTE — Progress Notes (Unsigned)
 Patient ID: Maria [name], female    DOB: 04/02/1941, 83 y.o.   MRN: [id number]   Assessment & Plan:  Pulmonary fibrosis (HCC)  Chronic respiratory failure with hypoxia (HCC)  Irregular heart rhythm  Multiple thyroid  nodules -     US  THYROID ; Future  Lumbar radiculopathy  Prolapsed uterus    Assessment & Plan Overall health appears stable with good control of chronic conditions. - Rechecked blood pressure - Continue current management of chronic conditions  Pulmonary fibrosis with chronic respiratory failure and hypoxia Pulmonary fibrosis with well-managed scarring. No changes in current therapy. Echocardiogram preliminary shows good heart function with no signs of heart failure. Awaiting official cardiologist report. Uses oxygen  during activities but manages to engage in physical activities like line dancing. - Continue current therapy for pulmonary fibrosis - Await official echocardiogram report from cardiologist - follow with pulmonology   Lumbar radiculopathy with left-sided bulging disc and neuropathic pain Chronic lumbar radiculopathy with left-sided bulging disc causing neuropathic pain. Symptoms include numbness and pain radiating down the leg. Considering epidural steroid injections for pain management. Discussed potential benefits and insurance coverage concerns. Injections are a comfortable in-office procedure with potential soreness post-procedure. - Will consider epidural steroid injections pending insurance approval - Continue physical therapy and exercises as advised by Dr. Joane  Nontoxic multinodular goiter with thyroid  nodules Thyroid  nodules noted on CT scan. Previous ultrasound in 2019 showed small bilateral nodules not meeting criteria for biopsy. No significant changes in thyroid  function tests. Family history of thyroid  issues. - Ordered thyroid  ultrasound to monitor nodules - Continue monitoring thyroid  function tests  Irregular heart rhythm (not  atrial fibrillation) Irregular heart rhythm previously evaluated as non-atrial fibrillation. Current EKG shows sinus rhythm. Experiences occasional chest pain and irregular heartbeat, possibly related to caffeine intake or stress. No recent cardiology follow-up. - Continue current medications for heart rhythm management - Monitor symptoms and consider reducing caffeine intake  Prolapsed uterus Surgical options not preferred. Pessary recommended but not well tolerated due to discomfort and displacement. - working with urogyn       Return in about 6 months (around 05/20/2025) for recheck/follow-up.    Subjective:    Chief Complaint  Patient presents with   Medical Management of Chronic Issues    Here for a 6 month follow-up. Has been seeing pulmonology and sports medicine. Had an echo this week. No one has follow-up on the upper back pain. Wondering about PT. Does that need to go through [facility]. Saw on TV that she is having all the symptoms listed for ATTR-CM. What is that?   New Insurance     Will be starting new Insurance in Jan called [employer]. She will let us  know if meds needs to have a PA send at the new year.     HPI Discussed the use of AI scribe software for clinical note transcription with the patient, who gave verbal consent to proceed.  History of Present Illness Maria [name] is an 83 year old female with pulmonary fibrosis who presents for her regular six month follow-up visit.  She experiences shortness of breath due to pulmonary fibrosis and requires oxygen  during activities. She engages in line dancing a couple of times a week but needs to rest between dances due to the burden of carrying portable oxygen . She has not been hiking recently because of increased numbness in her legs and tripping over roots.  She recently underwent an echocardiogram. She has not received the official results of  her recent echocardiogram. She has been seen at [facility] for cardiac issues in the  past but is not currently under the care of a cardiologist. She experiences occasional chest pain and heart palpitations, which she manages by staying calm. She has undergone heart monitoring in the past.  She experiences low back pain and numbness in her legs, which she attributes to scoliosis and a bulging disc. She has not tried injections for pain relief. She has been given exercises to help manage her symptoms.  She has a history of thyroid  nodules. She reports difficulty swallowing and has a family history of thyroid  issues, as her daughter has had similar problems.  She reports a prolapsed uterus, which has become problematic. She has not opted for surgical intervention.     Past Medical History:  Diagnosis Date   Anemia    Arthritis    Bronchitis    Carpal tunnel syndrome    in calves neuropathy   Diverticulosis    GERD (gastroesophageal reflux disease)    Headache(784.0)    chronic   High cholesterol    Hyperlipidemia    Hypertension    Migraine    Nephritis    OSA (obstructive sleep apnea)    Palpitations    Personal history of colonic adenomas 09/15/2010   Pyelonephritis, acute 09/23/2013   A. 10/14: Hospitalized [facility] for treatment     Stroke [facility])    TIA (transient ischemic attack)    Vertigo, benign paroxysmal     Past Surgical History:  Procedure Laterality Date   BLADDER REPAIR     BREAST BIOPSY     rt benign cyst   BREAST EXCISIONAL BIOPSY Right    CARPAL TUNNEL RELEASE     x 2   COLONOSCOPY     REPLACEMENT TOTAL KNEE BILATERAL     TONSILLECTOMY     UPPER GASTROINTESTINAL ENDOSCOPY      Family History  Problem Relation Age of Onset   Diabetes Father    Aneurysm Father    Kidney disease Father    Hypertension Father    Heart disease Mother    Colon polyps Mother    Chronic bronchitis Mother    Diabetes Brother    Colon cancer Maternal Grandmother    Irritable bowel syndrome Daughter    Emphysema Maternal Grandfather         smoked a pipe    Social History   Tobacco Use   Smoking status: Former    Current packs/day: 0.00    Average packs/day: 1 pack/day for 10.0 years (10.0 ttl pk-yrs)    Types: Cigarettes    Start date: 12/19/1959    Quit date: 12/18/1969    Years since quitting: 54.9    Passive exposure: Past   Smokeless tobacco: Never  Vaping Use   Vaping status: Never Used  Substance Use Topics   Alcohol use: Yes    Comment: ocassional use   Drug use: No     Allergies  Allergen Reactions   Amoxicillin  Hives   Olmesartan Hives   Ace Inhibitors Other (See Comments)    unknown   Bee Venom    Benicar [Olmesartan Medoxomil] Other (See Comments)    Tongue swelling   Codeine     REACTION: nausea   Covid-19 (Mrna) Vaccine     Increased shortness of Breath in Fibrosis   Lisinopril Other (See Comments)    unknown   Macrobid  [Nitrofurantoin  Monohyd Macro]     Pulmonary Fibrosis  Nutritional Supplements     Bee stings cause angioedema-carry an EPIPEN    Penicillins    Shingrix [Zoster Vac Recomb Adjuvanted] Other (See Comments)    Chills, feverish, nausea x 4 days    Review of Systems NEGATIVE UNLESS OTHERWISE INDICATED IN HPI      Objective:     BP 136/78 (BP Location: Left Arm, Patient Position: Sitting, Cuff Size: Normal)   Pulse 84   Temp 97.9 F (36.6 C) (Temporal)   Ht 5' 2 (1.575 m)   Wt 147 lb (66.7 kg)   SpO2 96%   BMI 26.89 kg/m   Wt Readings from Last 3 Encounters:  11/19/24 147 lb (66.7 kg)  11/17/24 148 lb (67.1 kg)  11/11/24 148 lb (67.1 kg)    BP Readings from Last 3 Encounters:  11/19/24 136/78  11/17/24 132/74  11/11/24 110/68     Physical Exam Vitals and nursing note reviewed.  Constitutional:      Appearance: Normal appearance.  Eyes:     Extraocular Movements: Extraocular movements intact.     Conjunctiva/sclera: Conjunctivae normal.     Pupils: Pupils are equal, round, and reactive to light.  Neck:     Thyroid : No thyroid  mass, thyromegaly  or thyroid  tenderness.  Cardiovascular:     Rate and Rhythm: Normal rate and regular rhythm.     Pulses: Normal pulses.     Heart sounds: Murmur (2/6 RUSB) heard.  Pulmonary:     Effort: Pulmonary effort is normal.     Breath sounds: Normal breath sounds.     Comments: Dry intermittent hacking cough Musculoskeletal:     Right lower leg: No edema.     Left lower leg: No edema.  Skin:    Findings: No lesion or rash.  Neurological:     General: No focal deficit present.     Mental Status: She is alert and oriented to person, place, and time.  Psychiatric:        Mood and Affect: Mood normal.        Behavior: Behavior normal.             Denyla Cortese M Odaliz Mcqueary, PA-C

## 2024-11-20 ENCOUNTER — Encounter: Admitting: Neurology

## 2024-11-21 ENCOUNTER — Ambulatory Visit: Payer: Self-pay | Admitting: Pulmonary Disease

## 2024-11-26 ENCOUNTER — Telehealth: Payer: Self-pay | Admitting: Physician Assistant

## 2024-11-26 NOTE — Telephone Encounter (Signed)
 [facility] faxed blood thinner clearance request, to be filled out by provider. Patient requested to send it back via Fax within ASAP. Document is located in providers tray at front office.Please advise at [phone].

## 2024-11-27 NOTE — Telephone Encounter (Signed)
 In you box to review

## 2024-12-03 ENCOUNTER — Inpatient Hospital Stay: Admission: RE | Admit: 2024-12-03 | Discharge: 2024-12-03 | Attending: Physician Assistant

## 2024-12-03 DIAGNOSIS — E042 Nontoxic multinodular goiter: Secondary | ICD-10-CM

## 2024-12-04 ENCOUNTER — Ambulatory Visit: Payer: Self-pay | Admitting: Physician Assistant

## 2024-12-04 ENCOUNTER — Other Ambulatory Visit: Payer: Self-pay | Admitting: Physician Assistant

## 2024-12-04 DIAGNOSIS — E042 Nontoxic multinodular goiter: Secondary | ICD-10-CM

## 2024-12-08 ENCOUNTER — Telehealth: Payer: Self-pay

## 2024-12-08 NOTE — Telephone Encounter (Signed)
 Copied from CRM #[id number]. Topic: Clinical - Medical Advice >> Dec 08, 2024  1:03 PM Maria [name] wrote: Reason for CRM: Patient would like to know how long will it be until she is able to drive from her injection and what will be injected into her. Please call back at [phone]

## 2024-12-08 NOTE — Telephone Encounter (Signed)
 Please see pt msg and advise

## 2024-12-09 ENCOUNTER — Telehealth: Payer: Self-pay | Admitting: Family Medicine

## 2024-12-09 NOTE — Telephone Encounter (Signed)
 Called pt for clarification and patient is asking about the Epidural ordered by Dr Joane. Advised pt she would need to contact his office for info regarding driving and restrictions. Gave pt correct phone number and verbalized understanding.

## 2024-12-09 NOTE — Telephone Encounter (Signed)
 Called pt and advised of below. Also advised to contact the imaging facility for specifics about the medications that will be used. Pt verbalized understanding.   Most ESIs include two main components:  1. A corticosteroid This is the anti-inflammatory medicine. Common examples include:  Triamcinolone   Methylprednisolone   Dexamethasone   These help reduce swelling and irritation around the spinal nerves.  2. A local anesthetic This provides temporary numbing and can help with immediate pain relief. Examples include:  Lidocaine   Bupivacaine  Some injections may also include a small amount of saline to help distribute the medication.  The exact medications and doses depend on the providers preference and the patients medical history.  What she may feel afterward Everyone responds a little differently, but these are common, normal experiences:  Right after the injection Temporary numbness or heaviness in the legs  A feeling of pressure in the back  Mild soreness at the injection site  Sometimes immediate pain relief from the anesthetic  Later the same day Soreness or increased pain for 24-48 hours (often called a steroid flare)  Fatigue or feeling off for the rest of the day  When the steroid starts working Steroids usually take 2-5 days to kick in, sometimes up to a week.  Pain relief can range from mild to significant, depending on the person.

## 2024-12-09 NOTE — Telephone Encounter (Signed)
 We ordered an epidural for pt, she is scheduled for 12/29.  Wants to know what meds will be in this shot and what expectations she should have for how she will feel.

## 2024-12-12 NOTE — Discharge Instructions (Signed)

## 2024-12-15 ENCOUNTER — Ambulatory Visit
Admission: RE | Admit: 2024-12-15 | Discharge: 2024-12-15 | Disposition: A | Discharge status: Home / Self Care | Source: Ambulatory Visit | Attending: Family Medicine | Admitting: Family Medicine

## 2024-12-15 DIAGNOSIS — R202 Paresthesia of skin: Secondary | ICD-10-CM

## 2024-12-15 MED ORDER — METHYLPREDNISOLONE ACETATE 40 MG/ML INJ SUSP (RADIOLOG
80.0000 mg | Freq: Once | INTRAMUSCULAR | Status: AC
  Administered 2024-12-15: 80 mg via EPIDURAL

## 2024-12-15 MED ORDER — IOPAMIDOL (ISOVUE-M 200) INJECTION 41%
1.0000 mL | Freq: Once | INTRAMUSCULAR | Status: AC
  Administered 2024-12-15: 1 mL via EPIDURAL

## 2025-01-13 ENCOUNTER — Telehealth: Payer: Self-pay | Admitting: Physician Assistant

## 2025-01-13 NOTE — Telephone Encounter (Signed)
 Pt called on 01/10/25 stating worsening cough. Spoke with pt & is unable to come in this week due to weather. Pt will call if anything changes.

## 2025-01-14 ENCOUNTER — Other Ambulatory Visit

## 2025-01-15 ENCOUNTER — Ambulatory Visit: Payer: Self-pay

## 2025-01-15 NOTE — Telephone Encounter (Signed)
 Appt tomorrow

## 2025-01-15 NOTE — Telephone Encounter (Signed)
 FYI Only or Action Required?: FYI only for provider: appointment scheduled on 01.30.26.  Patient was last seen in primary care on 11/19/2024 by Allwardt, Mardy HERO, PA-C.  Called Nurse Triage reporting Nasal Congestion.  Symptoms began a week ago.  Interventions attempted: OTC medications: Mucinex.  Symptoms are: gradually worsening.  Triage Disposition: See PCP When Office is Open (Within 3 Days)  Patient/caregiver understands and will follow disposition?: Yes  Message from [facility] ORN sent at 01/15/2025 12:12 PM EST  Reason for Triage: chest congestion, increased white thick mucus. Pt stated she called over the weekend and they wanted there to come in on Monday. She couldn't get out her driveway on Monday but it is clear now. Pt would like to be seen for a chest xray.   Reason for Disposition  Lots of coughing  Answer Assessment - Initial Assessment Questions 1. LOCATION: Where does it hurt?      Chest  2. ONSET: When did the sinus pain start?  (e.g., hours, days)       X 1 week  3. SEVERITY: How bad is the pain?   (Scale 0-10; or none, mild, moderate or severe)     Worse since onset  4. RECURRENT SYMPTOM: Have you ever had sinus problems before? If Yes, ask: When was the last time? and What happened that time?       Yes, recurrent  5. NASAL CONGESTION: Is the nose blocked? If Yes, ask: Can you open it or must you breathe through your mouth?      yes  6. NASAL DISCHARGE: Do you have discharge from your nose? If so ask, What color?      Yes, clear  7. FEVER: Do you have a fever? If Yes, ask: What is it, how was it measured, and when did it start?      denies  8. OTHER SYMPTOMS: Pt denies sore throat, earache, difficulty breathing      Coughing up mucus is thick, white and cough  Pt reports congestion Pt is taking OTC Mucinex Pt scheduled for a visit on  01.30.26 for further evaluation. Pt agrees with plan of care, will call back for any worsening  symptoms  Protocols used: Sinus Pain or Congestion-A-AH

## 2025-01-16 ENCOUNTER — Encounter: Payer: Self-pay | Admitting: Family

## 2025-01-16 ENCOUNTER — Ambulatory Visit: Admitting: Family

## 2025-01-16 VITALS — BP 154/82 | HR 81 | Temp 97.7°F | Ht 62.0 in | Wt 144.2 lb

## 2025-01-16 DIAGNOSIS — J8410 Pulmonary fibrosis, unspecified: Secondary | ICD-10-CM | POA: Diagnosis not present

## 2025-01-16 DIAGNOSIS — R0989 Other specified symptoms and signs involving the circulatory and respiratory systems: Secondary | ICD-10-CM

## 2025-01-16 MED ORDER — AZITHROMYCIN 250 MG PO TABS: ORAL_TABLET | ORAL | 0 refills | Status: AC

## 2025-01-26 ENCOUNTER — Other Ambulatory Visit

## 2025-05-04 ENCOUNTER — Ambulatory Visit

## 2025-05-20 ENCOUNTER — Ambulatory Visit: Admitting: Physician Assistant
